# Patient Record
Sex: Male | Born: 1955 | Hispanic: Yes | Marital: Married | Smoking: Former smoker
Health system: Southern US, Community
[De-identification: ages and names within clinical notes are randomized; demographics above are authoritative.]

## PROBLEM LIST (undated history)

## (undated) DIAGNOSIS — I219 Acute myocardial infarction, unspecified: Secondary | ICD-10-CM

## (undated) DIAGNOSIS — I2510 Atherosclerotic heart disease of native coronary artery without angina pectoris: Secondary | ICD-10-CM

## (undated) DIAGNOSIS — H35039 Hypertensive retinopathy, unspecified eye: Secondary | ICD-10-CM

## (undated) DIAGNOSIS — G4730 Sleep apnea, unspecified: Secondary | ICD-10-CM

## (undated) DIAGNOSIS — E119 Type 2 diabetes mellitus without complications: Secondary | ICD-10-CM

## (undated) DIAGNOSIS — M1990 Unspecified osteoarthritis, unspecified site: Secondary | ICD-10-CM

## (undated) DIAGNOSIS — I739 Peripheral vascular disease, unspecified: Secondary | ICD-10-CM

## (undated) DIAGNOSIS — Z973 Presence of spectacles and contact lenses: Secondary | ICD-10-CM

## (undated) DIAGNOSIS — E11319 Type 2 diabetes mellitus with unspecified diabetic retinopathy without macular edema: Secondary | ICD-10-CM

## (undated) DIAGNOSIS — H269 Unspecified cataract: Secondary | ICD-10-CM

## (undated) DIAGNOSIS — F419 Anxiety disorder, unspecified: Secondary | ICD-10-CM

## (undated) DIAGNOSIS — M25811 Other specified joint disorders, right shoulder: Secondary | ICD-10-CM

## (undated) DIAGNOSIS — M7541 Impingement syndrome of right shoulder: Secondary | ICD-10-CM

## (undated) DIAGNOSIS — I509 Heart failure, unspecified: Secondary | ICD-10-CM

## (undated) DIAGNOSIS — K259 Gastric ulcer, unspecified as acute or chronic, without hemorrhage or perforation: Secondary | ICD-10-CM

## (undated) DIAGNOSIS — I499 Cardiac arrhythmia, unspecified: Secondary | ICD-10-CM

## (undated) DIAGNOSIS — J189 Pneumonia, unspecified organism: Secondary | ICD-10-CM

## (undated) DIAGNOSIS — K219 Gastro-esophageal reflux disease without esophagitis: Secondary | ICD-10-CM

## (undated) DIAGNOSIS — I10 Essential (primary) hypertension: Secondary | ICD-10-CM

## (undated) DIAGNOSIS — Z95810 Presence of automatic (implantable) cardiac defibrillator: Secondary | ICD-10-CM

## (undated) HISTORY — PX: CARDIAC DEFIBRILLATOR PLACEMENT: SHX171

## (undated) HISTORY — DX: Unspecified cataract: H26.9

## (undated) HISTORY — DX: Type 2 diabetes mellitus with unspecified diabetic retinopathy without macular edema: E11.319

## (undated) HISTORY — DX: Hypertensive retinopathy, unspecified eye: H35.039

## (undated) HISTORY — PX: CARDIAC CATHETERIZATION: SHX172

---

## 2016-04-17 DIAGNOSIS — I2510 Atherosclerotic heart disease of native coronary artery without angina pectoris: Secondary | ICD-10-CM | POA: Diagnosis not present

## 2016-04-21 DIAGNOSIS — M1712 Unilateral primary osteoarthritis, left knee: Secondary | ICD-10-CM | POA: Diagnosis not present

## 2016-04-21 DIAGNOSIS — M25562 Pain in left knee: Secondary | ICD-10-CM | POA: Diagnosis not present

## 2016-07-14 ENCOUNTER — Encounter (HOSPITAL_COMMUNITY): Payer: Self-pay | Admitting: Emergency Medicine

## 2016-07-14 ENCOUNTER — Emergency Department (HOSPITAL_COMMUNITY): Payer: Medicaid Other

## 2016-07-14 ENCOUNTER — Emergency Department (HOSPITAL_COMMUNITY)
Admission: EM | Admit: 2016-07-14 | Discharge: 2016-07-14 | Disposition: A | Discharge status: Home / Self Care | Payer: Medicaid Other | Attending: Emergency Medicine | Admitting: Emergency Medicine

## 2016-07-14 DIAGNOSIS — Y939 Activity, unspecified: Secondary | ICD-10-CM | POA: Insufficient documentation

## 2016-07-14 DIAGNOSIS — F17200 Nicotine dependence, unspecified, uncomplicated: Secondary | ICD-10-CM | POA: Diagnosis not present

## 2016-07-14 DIAGNOSIS — Z7984 Long term (current) use of oral hypoglycemic drugs: Secondary | ICD-10-CM | POA: Insufficient documentation

## 2016-07-14 DIAGNOSIS — M7022 Olecranon bursitis, left elbow: Secondary | ICD-10-CM | POA: Insufficient documentation

## 2016-07-14 DIAGNOSIS — Z79899 Other long term (current) drug therapy: Secondary | ICD-10-CM | POA: Diagnosis not present

## 2016-07-14 DIAGNOSIS — M25522 Pain in left elbow: Secondary | ICD-10-CM | POA: Diagnosis present

## 2016-07-14 DIAGNOSIS — L03114 Cellulitis of left upper limb: Secondary | ICD-10-CM | POA: Diagnosis not present

## 2016-07-14 DIAGNOSIS — E119 Type 2 diabetes mellitus without complications: Secondary | ICD-10-CM | POA: Diagnosis not present

## 2016-07-14 HISTORY — DX: Type 2 diabetes mellitus without complications: E11.9

## 2016-07-14 MED ORDER — NAPROXEN 375 MG PO TABS: 375.0000 mg | ORAL_TABLET | Freq: Two times a day (BID) | ORAL | 0 refills | Status: DC

## 2016-07-14 MED ORDER — CEPHALEXIN 500 MG PO CAPS: 500.0000 mg | ORAL_CAPSULE | Freq: Two times a day (BID) | ORAL | 0 refills | Status: DC

## 2016-07-14 NOTE — ED Provider Notes (Signed)
[facility]-EMERGENCY DEPT Provider Note   CSN: [id number] Arrival date & time: 07/14/16  1112     History   Chief Complaint Chief Complaint  Patient presents with  . Joint Swelling    HPI Jon Jones is a 61 y.o. male.  Patient is a 61 year old male with a history of diabetes presenting today with 3 days of worsening left elbow pain and redness. He denies any systemic symptoms of fever, nausea, vomiting or change in his blood sugars. He notes swelling, warmth and redness over the left elbow with redness spreading mildly down the forearm. No wrist or shoulder pain. He is able to bend and extend the elbow without difficulty and denies any numbness or tingling. States he had one episode prior years ago that resolved spontaneously.   The history is provided by the patient.    Past Medical History:  Diagnosis Date  . Diabetes mellitus without complication ([name])     There are no active problems to display for this patient.   History reviewed. No pertinent surgical history.     Home Medications    Prior to Admission medications   Medication Sig Start Date End Date Taking? Authorizing Provider  atorvastatin (LIPITOR) 80 MG tablet Take 80 mg by mouth every evening. 04/17/16 04/17/17 Yes Historical Provider, MD  buPROPion (WELLBUTRIN SR) 150 MG 12 hr tablet Take 150 mg by mouth 2 (two) times daily at 8 am and 10 pm. 04/11/16  Yes Historical Provider, MD  empagliflozin (JARDIANCE) 25 MG TABS tablet Take 25 mg by mouth daily. 07/03/16  Yes Historical Provider, MD  glipiZIDE (GLUCOTROL) 10 MG tablet Take 10 mg by mouth 2 (two) times daily at 8 am and 10 pm. 04/03/16  Yes Historical Provider, MD  lisinopril (PRINIVIL,ZESTRIL) 5 MG tablet Take 5 mg by mouth daily. 04/17/16 04/17/17 Yes Historical Provider, MD  metFORMIN (GLUCOPHAGE) 1000 MG tablet Take 1,000 mg by mouth 2 (two) times daily at 8 am and 10 pm. 04/13/16  Yes Historical Provider, MD  metoprolol succinate (TOPROL-XL) 100 MG 24 hr tablet Take  100 mg by mouth every evening. 04/17/16 04/17/17 Yes Historical Provider, MD  nitroGLYCERIN (NITROSTAT) 0.4 MG SL tablet Place 0.4 mg under the tongue See admin instructions. Every 5 minutes as needed for chest pain. Max of 3 doses 04/03/16  Yes Historical Provider, MD  tiotropium (SPIRIVA) 18 MCG inhalation capsule Place 1 capsule into inhaler and inhale daily. 04/17/16 04/17/17 Yes Historical Provider, MD    Family History History reviewed. No pertinent family history.  Social History Social History  Substance Use Topics  . Smoking status: Current Every Day Smoker  . Smokeless tobacco: Never Used  . Alcohol use No     Allergies   Patient has no known allergies.   Review of Systems Review of Systems  All other systems reviewed and are negative.    Physical Exam Updated Vital Signs BP 100/73 (BP Location: Right Arm)   Pulse 91   Temp 99.1 F (37.3 C) (Oral)   Resp 15   Ht 5\' 6"  (1.676 m)   Wt 220 lb (99.8 kg)   SpO2 96%   BMI 35.51 kg/m   Physical Exam  Constitutional: He is oriented to person, place, and time. He appears well-developed and well-nourished. No distress.  HENT:  Head: Normocephalic and atraumatic.  Mouth/Throat: Oropharynx is clear and moist.  Eyes: Conjunctivae and EOM are normal. Pupils are equal, round, and reactive to light.  Neck: Normal range of motion. Neck supple.  Cardiovascular: Normal rate, regular rhythm and intact distal pulses.   No murmur heard. Pulmonary/Chest: Effort normal and breath sounds normal. No respiratory distress. He has no wheezes. He has no rales.  Abdominal: Soft. He exhibits no distension. There is no tenderness. There is no rebound and no guarding.  Musculoskeletal: Normal range of motion. He exhibits edema and tenderness.       Left elbow: He exhibits swelling and effusion. He exhibits normal range of motion. Tenderness found. Olecranon process tenderness noted.       Arms: Neurological: He is alert and oriented to person,  place, and time.  Skin: Skin is warm and dry. No rash noted. No erythema.  Psychiatric: He has a normal mood and affect. His behavior is normal.  Nursing note and vitals reviewed.    ED Treatments / Results  Labs (all labs ordered are listed, but only abnormal results are displayed) Labs Reviewed - No data to display  EKG  EKG Interpretation None       Radiology Dg Elbow Complete Left  Result Date: 07/14/2016 CLINICAL DATA:  Left posterior elbow pain and swelling for 3 days EXAM: LEFT ELBOW - COMPLETE 3+ VIEW COMPARISON:  None. FINDINGS: There is no evidence of fracture, dislocation, or joint effusion. There is no evidence of arthropathy or other focal bone abnormality. Soft tissue swelling along the dorsal aspect of the olecranon which may reflect olecranon bursitis. IMPRESSION: No acute osseous injury of the left elbow. Electronically Signed   By: Elige KoHetal  Patel   On: 07/14/2016 12:45    Procedures Procedures (including critical care time)  Medications Ordered in ED Medications - No data to display   Initial Impression / Assessment and Plan / ED Course  I have reviewed the triage vital signs and the nursing notes.  Pertinent labs & imaging results that were available during my care of the patient were reviewed by me and considered in my medical decision making (see chart for details).     Patient presenting here with evidence of olecranon bursitis concern for also secondary infection however no systemic symptoms. Low suspicion for septic joint. Patient's symptoms started after he had just recently put a porch on his house and he was leaning over the side rails for long periods of time enjoying the outdoor weather. X-ray is within normal limits. Will start patient on Keflex and anti-inflammatories. To follow-up with orthopedics.  Final Clinical Impressions(s) / ED Diagnoses   Final diagnoses:  Olecranon bursitis of left elbow  Cellulitis of left upper extremity    New  Prescriptions New Prescriptions   CEPHALEXIN (KEFLEX) 500 MG CAPSULE    Take 1 capsule (500 mg total) by mouth 2 (two) times daily.   NAPROXEN (NAPROSYN) 375 MG TABLET    Take 1 tablet (375 mg total) by mouth 2 (two) times daily.     Gwyneth SproutWhitney Faolan [place], MD 07/14/16 [phone] [id number]

## 2016-07-14 NOTE — ED Triage Notes (Signed)
Pt reports increased L elbow redness and swelling. L elbow red and swollen.

## 2016-07-14 NOTE — ED Notes (Signed)
ED Provider at bedside. 

## 2016-07-14 NOTE — ED Notes (Signed)
Discharge instructions, follow up care, and rx x2 reviewed with patient. Patient verbalized understanding. 

## 2016-07-19 ENCOUNTER — Encounter (HOSPITAL_COMMUNITY)
Admission: RE | Disposition: A | Discharge status: Home / Self Care | Payer: Self-pay | Source: Ambulatory Visit | Attending: Orthopaedic Surgery

## 2016-07-19 ENCOUNTER — Other Ambulatory Visit (INDEPENDENT_AMBULATORY_CARE_PROVIDER_SITE_OTHER): Payer: Self-pay | Admitting: Orthopaedic Surgery

## 2016-07-19 ENCOUNTER — Ambulatory Visit (HOSPITAL_BASED_OUTPATIENT_CLINIC_OR_DEPARTMENT_OTHER): Payer: Medicaid Other | Admitting: Anesthesiology

## 2016-07-19 ENCOUNTER — Encounter (HOSPITAL_BASED_OUTPATIENT_CLINIC_OR_DEPARTMENT_OTHER): Payer: Self-pay | Admitting: *Deleted

## 2016-07-19 ENCOUNTER — Observation Stay (HOSPITAL_BASED_OUTPATIENT_CLINIC_OR_DEPARTMENT_OTHER)
Admission: RE | Admit: 2016-07-19 | Discharge: 2016-07-20 | Disposition: A | Discharge status: Home / Self Care | Payer: Medicaid Other | Source: Ambulatory Visit | Attending: Orthopaedic Surgery | Admitting: Orthopaedic Surgery

## 2016-07-19 ENCOUNTER — Ambulatory Visit (INDEPENDENT_AMBULATORY_CARE_PROVIDER_SITE_OTHER): Payer: Medicaid Other | Admitting: Orthopedic Surgery

## 2016-07-19 ENCOUNTER — Encounter (INDEPENDENT_AMBULATORY_CARE_PROVIDER_SITE_OTHER): Payer: Self-pay | Admitting: Orthopedic Surgery

## 2016-07-19 DIAGNOSIS — I10 Essential (primary) hypertension: Secondary | ICD-10-CM | POA: Insufficient documentation

## 2016-07-19 DIAGNOSIS — G4730 Sleep apnea, unspecified: Secondary | ICD-10-CM | POA: Insufficient documentation

## 2016-07-19 DIAGNOSIS — M7022 Olecranon bursitis, left elbow: Secondary | ICD-10-CM

## 2016-07-19 DIAGNOSIS — M71122 Other infective bursitis, left elbow: Secondary | ICD-10-CM

## 2016-07-19 DIAGNOSIS — E1151 Type 2 diabetes mellitus with diabetic peripheral angiopathy without gangrene: Secondary | ICD-10-CM | POA: Diagnosis not present

## 2016-07-19 DIAGNOSIS — F17210 Nicotine dependence, cigarettes, uncomplicated: Secondary | ICD-10-CM | POA: Diagnosis not present

## 2016-07-19 DIAGNOSIS — M109 Gout, unspecified: Secondary | ICD-10-CM | POA: Diagnosis present

## 2016-07-19 DIAGNOSIS — I110 Hypertensive heart disease with heart failure: Secondary | ICD-10-CM | POA: Diagnosis not present

## 2016-07-19 DIAGNOSIS — Z7984 Long term (current) use of oral hypoglycemic drugs: Secondary | ICD-10-CM | POA: Diagnosis not present

## 2016-07-19 DIAGNOSIS — M1A9XX1 Chronic gout, unspecified, with tophus (tophi): Secondary | ICD-10-CM | POA: Diagnosis not present

## 2016-07-19 DIAGNOSIS — L02414 Cutaneous abscess of left upper limb: Secondary | ICD-10-CM | POA: Diagnosis present

## 2016-07-19 DIAGNOSIS — I252 Old myocardial infarction: Secondary | ICD-10-CM | POA: Diagnosis not present

## 2016-07-19 DIAGNOSIS — Z9889 Other specified postprocedural states: Secondary | ICD-10-CM

## 2016-07-19 DIAGNOSIS — I509 Heart failure, unspecified: Secondary | ICD-10-CM | POA: Insufficient documentation

## 2016-07-19 DIAGNOSIS — I2510 Atherosclerotic heart disease of native coronary artery without angina pectoris: Secondary | ICD-10-CM | POA: Diagnosis not present

## 2016-07-19 HISTORY — PX: I & D EXTREMITY: SHX5045

## 2016-07-19 HISTORY — DX: Heart failure, unspecified: I50.9

## 2016-07-19 HISTORY — DX: Acute myocardial infarction, unspecified: I21.9

## 2016-07-19 HISTORY — PX: OLECRANON BURSECTOMY: SHX2097

## 2016-07-19 HISTORY — DX: Cardiac arrhythmia, unspecified: I49.9

## 2016-07-19 HISTORY — DX: Peripheral vascular disease, unspecified: I73.9

## 2016-07-19 HISTORY — DX: Atherosclerotic heart disease of native coronary artery without angina pectoris: I25.10

## 2016-07-19 HISTORY — DX: Sleep apnea, unspecified: G47.30

## 2016-07-19 LAB — POCT I-STAT, CHEM 8
BUN: 21 mg/dL — AB (ref 6–20)
CHLORIDE: 107 mmol/L (ref 101–111)
CREATININE: 0.9 mg/dL (ref 0.61–1.24)
Calcium, Ion: 1.27 mmol/L (ref 1.15–1.40)
Glucose, Bld: 111 mg/dL — ABNORMAL HIGH (ref 65–99)
HEMATOCRIT: 46 % (ref 39.0–52.0)
Hemoglobin: 15.6 g/dL (ref 13.0–17.0)
POTASSIUM: 4.2 mmol/L (ref 3.5–5.1)
Sodium: 141 mmol/L (ref 135–145)
TCO2: 23 mmol/L (ref 0–100)

## 2016-07-19 LAB — GLUCOSE, CAPILLARY
GLUCOSE-CAPILLARY: 82 mg/dL (ref 65–99)
GLUCOSE-CAPILLARY: 115 mg/dL — AB (ref 65–99)
GLUCOSE-CAPILLARY: 188 mg/dL — AB (ref 65–99)
Glucose-Capillary: 72 mg/dL (ref 65–99)

## 2016-07-19 SURGERY — IRRIGATION AND DEBRIDEMENT EXTREMITY
Anesthesia: General | Site: Elbow | Laterality: Left

## 2016-07-19 SURGERY — BURSECTOMY, ELBOW
Anesthesia: Monitor Anesthesia Care | Laterality: Left

## 2016-07-19 MED ORDER — METHOCARBAMOL 500 MG PO TABS
500.0000 mg | ORAL_TABLET | Freq: Four times a day (QID) | ORAL | Status: DC | PRN
  Filled 2016-07-19: qty 1

## 2016-07-19 MED ORDER — METOCLOPRAMIDE HCL 5 MG/ML IJ SOLN: 5.0000 mg | Freq: Three times a day (TID) | INTRAMUSCULAR | Status: DC | PRN

## 2016-07-19 MED ORDER — LIDOCAINE HCL (CARDIAC) 20 MG/ML IV SOLN
INTRAVENOUS | Status: DC | PRN
  Administered 2016-07-19: 50 mg via INTRATRACHEAL

## 2016-07-19 MED ORDER — METOPROLOL SUCCINATE ER 100 MG PO TB24
100.0000 mg | ORAL_TABLET | Freq: Every evening | ORAL | Status: DC
  Administered 2016-07-19: 100 mg via ORAL
  Filled 2016-07-19: qty 1

## 2016-07-19 MED ORDER — ATORVASTATIN CALCIUM 80 MG PO TABS: 80.0000 mg | ORAL_TABLET | Freq: Every evening | ORAL | Status: DC

## 2016-07-19 MED ORDER — SULFAMETHOXAZOLE-TRIMETHOPRIM 800-160 MG PO TABS: 1.0000 | ORAL_TABLET | Freq: Two times a day (BID) | ORAL | 0 refills | Status: DC

## 2016-07-19 MED ORDER — DIPHENHYDRAMINE HCL 12.5 MG/5ML PO ELIX: 25.0000 mg | ORAL_SOLUTION | ORAL | Status: DC | PRN

## 2016-07-19 MED ORDER — MIDAZOLAM HCL 2 MG/2ML IJ SOLN
INTRAMUSCULAR | Status: AC
  Administered 2016-07-19: 1 mg via INTRAVENOUS
  Filled 2016-07-19: qty 2

## 2016-07-19 MED ORDER — NAPROXEN 250 MG PO TABS
375.0000 mg | ORAL_TABLET | Freq: Two times a day (BID) | ORAL | Status: DC
  Administered 2016-07-19: 375 mg via ORAL
  Filled 2016-07-19: qty 2

## 2016-07-19 MED ORDER — MAGNESIUM CITRATE PO SOLN: 1.0000 | Freq: Once | ORAL | Status: DC | PRN

## 2016-07-19 MED ORDER — COLCHICINE 0.6 MG PO TABS: 0.6000 mg | ORAL_TABLET | Freq: Two times a day (BID) | ORAL | 0 refills | Status: DC | PRN

## 2016-07-19 MED ORDER — TIOTROPIUM BROMIDE MONOHYDRATE 18 MCG IN CAPS
1.0000 | ORAL_CAPSULE | Freq: Every day | RESPIRATORY_TRACT | Status: DC
  Filled 2016-07-19: qty 5

## 2016-07-19 MED ORDER — OXYCODONE HCL 5 MG PO TABS: 5.0000 mg | ORAL_TABLET | Freq: Once | ORAL | Status: DC | PRN

## 2016-07-19 MED ORDER — SORBITOL 70 % SOLN
30.0000 mL | Freq: Every day | Status: DC | PRN
  Filled 2016-07-19: qty 30

## 2016-07-19 MED ORDER — NITROGLYCERIN 0.4 MG SL SUBL: 0.4000 mg | SUBLINGUAL_TABLET | SUBLINGUAL | Status: DC | PRN

## 2016-07-19 MED ORDER — ONDANSETRON HCL 4 MG PO TABS: 4.0000 mg | ORAL_TABLET | Freq: Four times a day (QID) | ORAL | Status: DC | PRN

## 2016-07-19 MED ORDER — ONDANSETRON HCL 4 MG/2ML IJ SOLN: 4.0000 mg | Freq: Four times a day (QID) | INTRAMUSCULAR | Status: DC | PRN

## 2016-07-19 MED ORDER — METHOCARBAMOL 1000 MG/10ML IJ SOLN
500.0000 mg | Freq: Four times a day (QID) | INTRAVENOUS | Status: DC | PRN
  Filled 2016-07-19: qty 5

## 2016-07-19 MED ORDER — LACTATED RINGERS IV SOLN
Freq: Once | INTRAVENOUS | Status: AC
  Administered 2016-07-19: 15:00:00 via INTRAVENOUS

## 2016-07-19 MED ORDER — VANCOMYCIN HCL IN DEXTROSE 1-5 GM/200ML-% IV SOLN
INTRAVENOUS | Status: AC
  Filled 2016-07-19: qty 200

## 2016-07-19 MED ORDER — LIDOCAINE 2% (20 MG/ML) 5 ML SYRINGE
INTRAMUSCULAR | Status: AC
  Filled 2016-07-19: qty 5

## 2016-07-19 MED ORDER — ACETAMINOPHEN 325 MG PO TABS
650.0000 mg | ORAL_TABLET | Freq: Four times a day (QID) | ORAL | Status: DC | PRN
Start: 2016-07-19 — End: 2016-07-20

## 2016-07-19 MED ORDER — CEFAZOLIN SODIUM-DEXTROSE 1-4 GM/50ML-% IV SOLN
INTRAVENOUS | Status: DC | PRN
  Administered 2016-07-19: 1 g via INTRAVENOUS

## 2016-07-19 MED ORDER — CEFAZOLIN SODIUM-DEXTROSE 2-4 GM/100ML-% IV SOLN
2.0000 g | Freq: Four times a day (QID) | INTRAVENOUS | Status: DC
  Administered 2016-07-20 (×2): 2 g via INTRAVENOUS
  Filled 2016-07-19 (×3): qty 100

## 2016-07-19 MED ORDER — LACTATED RINGERS IV SOLN
INTRAVENOUS | Status: DC
Start: 2016-07-19 — End: 2016-07-19
  Administered 2016-07-19: 14:00:00 via INTRAVENOUS

## 2016-07-19 MED ORDER — OXYCODONE HCL 5 MG PO TABS
5.0000 mg | ORAL_TABLET | ORAL | Status: DC | PRN
  Administered 2016-07-19: 15 mg via ORAL
  Filled 2016-07-19: qty 3

## 2016-07-19 MED ORDER — HYDROCODONE-ACETAMINOPHEN 5-325 MG PO TABS: 1.0000 | ORAL_TABLET | Freq: Four times a day (QID) | ORAL | 0 refills | Status: DC | PRN

## 2016-07-19 MED ORDER — SODIUM CHLORIDE 0.9 % IR SOLN
Status: DC | PRN
  Administered 2016-07-19: 3000 mL

## 2016-07-19 MED ORDER — SCOPOLAMINE 1 MG/3DAYS TD PT72: 1.0000 | MEDICATED_PATCH | Freq: Once | TRANSDERMAL | Status: DC | PRN

## 2016-07-19 MED ORDER — FENTANYL CITRATE (PF) 100 MCG/2ML IJ SOLN
50.0000 ug | INTRAMUSCULAR | Status: DC | PRN
  Administered 2016-07-19: 50 ug via INTRAVENOUS

## 2016-07-19 MED ORDER — CEFAZOLIN SODIUM-DEXTROSE 2-4 GM/100ML-% IV SOLN: 2.0000 g | INTRAVENOUS | Status: DC

## 2016-07-19 MED ORDER — ACETAMINOPHEN 650 MG RE SUPP: 650.0000 mg | Freq: Four times a day (QID) | RECTAL | Status: DC | PRN

## 2016-07-19 MED ORDER — INSULIN ASPART 100 UNIT/ML ~~LOC~~ SOLN: 0.0000 [IU] | Freq: Every day | SUBCUTANEOUS | Status: DC

## 2016-07-19 MED ORDER — SODIUM CHLORIDE 0.9 % IV SOLN: INTRAVENOUS | Status: DC

## 2016-07-19 MED ORDER — FENTANYL CITRATE (PF) 100 MCG/2ML IJ SOLN: 25.0000 ug | INTRAMUSCULAR | Status: DC | PRN

## 2016-07-19 MED ORDER — MORPHINE SULFATE (PF) 4 MG/ML IV SOLN: 1.0000 mg | INTRAVENOUS | Status: DC | PRN

## 2016-07-19 MED ORDER — CANAGLIFLOZIN 100 MG PO TABS
100.0000 mg | ORAL_TABLET | Freq: Every day | ORAL | Status: DC
  Administered 2016-07-20: 100 mg via ORAL
  Filled 2016-07-19: qty 1

## 2016-07-19 MED ORDER — POLYETHYLENE GLYCOL 3350 17 G PO PACK: 17.0000 g | PACK | Freq: Every day | ORAL | Status: DC | PRN

## 2016-07-19 MED ORDER — MIDAZOLAM HCL 2 MG/2ML IJ SOLN
1.0000 mg | INTRAMUSCULAR | Status: DC | PRN
  Administered 2016-07-19: 1 mg via INTRAVENOUS

## 2016-07-19 MED ORDER — METOCLOPRAMIDE HCL 5 MG PO TABS: 5.0000 mg | ORAL_TABLET | Freq: Three times a day (TID) | ORAL | Status: DC | PRN

## 2016-07-19 MED ORDER — INSULIN ASPART 100 UNIT/ML ~~LOC~~ SOLN
0.0000 [IU] | Freq: Three times a day (TID) | SUBCUTANEOUS | Status: DC
  Administered 2016-07-20: 3 [IU] via SUBCUTANEOUS

## 2016-07-19 MED ORDER — GLIPIZIDE 5 MG PO TABS
10.0000 mg | ORAL_TABLET | Freq: Two times a day (BID) | ORAL | Status: DC
  Administered 2016-07-20: 10 mg via ORAL
  Filled 2016-07-19: qty 2

## 2016-07-19 MED ORDER — BUPROPION HCL ER (SR) 150 MG PO TB12
150.0000 mg | ORAL_TABLET | Freq: Two times a day (BID) | ORAL | Status: DC
  Administered 2016-07-19 – 2016-07-20 (×2): 150 mg via ORAL
  Filled 2016-07-19 (×2): qty 1

## 2016-07-19 MED ORDER — CEFAZOLIN SODIUM-DEXTROSE 2-4 GM/100ML-% IV SOLN
INTRAVENOUS | Status: AC
  Filled 2016-07-19: qty 100

## 2016-07-19 MED ORDER — LISINOPRIL 5 MG PO TABS: 5.0000 mg | ORAL_TABLET | Freq: Every day | ORAL | Status: DC

## 2016-07-19 MED ORDER — FENTANYL CITRATE (PF) 100 MCG/2ML IJ SOLN
INTRAMUSCULAR | Status: AC
  Administered 2016-07-19: 50 ug via INTRAVENOUS
  Filled 2016-07-19: qty 2

## 2016-07-19 MED ORDER — OXYCODONE HCL 5 MG/5ML PO SOLN: 5.0000 mg | Freq: Once | ORAL | Status: DC | PRN

## 2016-07-19 MED ORDER — LACTATED RINGERS IV SOLN
INTRAVENOUS | Status: DC | PRN
  Administered 2016-07-19: 19:00:00 via INTRAVENOUS

## 2016-07-19 MED ORDER — PROPOFOL 10 MG/ML IV BOLUS
INTRAVENOUS | Status: DC | PRN
  Administered 2016-07-19: 50 mg via INTRAVENOUS

## 2016-07-19 SURGICAL SUPPLY — 47 items
BANDAGE ACE 4X5 VEL STRL LF (GAUZE/BANDAGES/DRESSINGS) ×3 IMPLANT
BLADE SURG 15 STRL LF DISP TIS (BLADE) ×1 IMPLANT
BLADE SURG 15 STRL SS (BLADE) ×2
BNDG COHESIVE 3X5 TAN STRL LF (GAUZE/BANDAGES/DRESSINGS) ×3 IMPLANT
BNDG ESMARK 4X9 LF (GAUZE/BANDAGES/DRESSINGS) ×3 IMPLANT
CLOSURE WOUND 1/4X4 (GAUZE/BANDAGES/DRESSINGS)
COVER BACK TABLE 60X90IN (DRAPES) ×3 IMPLANT
COVER MAYO STAND STRL (DRAPES) ×3 IMPLANT
CUFF TOURN SGL LL 18 NRW (TOURNIQUET CUFF) IMPLANT
DECANTER SPIKE VIAL GLASS SM (MISCELLANEOUS) IMPLANT
DRAPE EXTREMITY T 121X128X90 (DRAPE) ×3 IMPLANT
DRAPE IMP U-DRAPE 54X76 (DRAPES) ×3 IMPLANT
DRAPE SURG 17X23 STRL (DRAPES) ×3 IMPLANT
DURAPREP 26ML APPLICATOR (WOUND CARE) ×3 IMPLANT
ELECT REM PT RETURN 9FT ADLT (ELECTROSURGICAL) ×3
ELECTRODE REM PT RTRN 9FT ADLT (ELECTROSURGICAL) ×1 IMPLANT
GAUZE SPONGE 4X4 12PLY STRL (GAUZE/BANDAGES/DRESSINGS) ×3 IMPLANT
GAUZE XEROFORM 1X8 LF (GAUZE/BANDAGES/DRESSINGS) ×3 IMPLANT
GLOVE SKINSENSE NS SZ7.5 (GLOVE) ×2
GLOVE SKINSENSE STRL SZ7.5 (GLOVE) ×1 IMPLANT
GLOVE SURG SYN 7.5  E (GLOVE) ×2
GLOVE SURG SYN 7.5 E (GLOVE) ×1 IMPLANT
GOWN STRL REIN XL XLG (GOWN DISPOSABLE) ×3 IMPLANT
GOWN STRL REUS W/ TWL LRG LVL3 (GOWN DISPOSABLE) ×1 IMPLANT
GOWN STRL REUS W/TWL LRG LVL3 (GOWN DISPOSABLE) ×2
NEEDLE HYPO 22GX1.5 SAFETY (NEEDLE) IMPLANT
NS IRRIG 1000ML POUR BTL (IV SOLUTION) ×3 IMPLANT
PACK BASIN DAY SURGERY FS (CUSTOM PROCEDURE TRAY) ×3 IMPLANT
PAD CAST 4YDX4 CTTN HI CHSV (CAST SUPPLIES) ×1 IMPLANT
PADDING CAST ABS 4INX4YD NS (CAST SUPPLIES)
PADDING CAST ABS COTTON 4X4 ST (CAST SUPPLIES) IMPLANT
PADDING CAST COTTON 4X4 STRL (CAST SUPPLIES) ×2
PENCIL BUTTON HOLSTER BLD 10FT (ELECTRODE) ×3 IMPLANT
SLEEVE SCD COMPRESS KNEE MED (MISCELLANEOUS) ×3 IMPLANT
SPLINT PLASTER CAST XFAST 3X15 (CAST SUPPLIES) IMPLANT
SPLINT PLASTER XTRA FASTSET 3X (CAST SUPPLIES)
STOCKINETTE IMPERVIOUS LG (DRAPES) ×3 IMPLANT
STRIP CLOSURE SKIN 1/4X4 (GAUZE/BANDAGES/DRESSINGS) IMPLANT
SUT ETHILON 3 0 PS 1 (SUTURE) IMPLANT
SUT ETHILON 4 0 PS 2 18 (SUTURE) IMPLANT
SUT VIC AB 2-0 CT1 27 (SUTURE)
SUT VIC AB 2-0 CT1 TAPERPNT 27 (SUTURE) IMPLANT
SUT VIC AB 4-0 P-3 18XBRD (SUTURE) IMPLANT
SUT VIC AB 4-0 P3 18 (SUTURE)
SYR BULB 3OZ (MISCELLANEOUS) ×3 IMPLANT
TOWEL OR 17X24 6PK STRL BLUE (TOWEL DISPOSABLE) ×3 IMPLANT
UNDERPAD 30X30 (UNDERPADS AND DIAPERS) ×3 IMPLANT

## 2016-07-19 SURGICAL SUPPLY — 51 items
BANDAGE ACE 3X5.8 VEL STRL LF (GAUZE/BANDAGES/DRESSINGS) IMPLANT
BANDAGE ACE 6X5 VEL STRL LF (GAUZE/BANDAGES/DRESSINGS) ×3 IMPLANT
BNDG COHESIVE 1X5 TAN STRL LF (GAUZE/BANDAGES/DRESSINGS) IMPLANT
BNDG COHESIVE 4X5 TAN STRL (GAUZE/BANDAGES/DRESSINGS) ×3 IMPLANT
BNDG COHESIVE 6X5 TAN STRL LF (GAUZE/BANDAGES/DRESSINGS) ×6 IMPLANT
BNDG CONFORM 3 STRL LF (GAUZE/BANDAGES/DRESSINGS) IMPLANT
BNDG GAUZE ELAST 4 BULKY (GAUZE/BANDAGES/DRESSINGS) ×3 IMPLANT
BNDG GAUZE STRTCH 6 (GAUZE/BANDAGES/DRESSINGS) ×3 IMPLANT
CORDS BIPOLAR (ELECTRODE) IMPLANT
COVER SURGICAL LIGHT HANDLE (MISCELLANEOUS) ×3 IMPLANT
CUFF TOURNIQUET SINGLE 24IN (TOURNIQUET CUFF) IMPLANT
CUFF TOURNIQUET SINGLE 34IN LL (TOURNIQUET CUFF) IMPLANT
CUFF TOURNIQUET SINGLE 44IN (TOURNIQUET CUFF) IMPLANT
DRAPE INCISE IOBAN 66X45 STRL (DRAPES) IMPLANT
DRAPE U-SHAPE 47X51 STRL (DRAPES) ×3 IMPLANT
DURAPREP 26ML APPLICATOR (WOUND CARE) ×3 IMPLANT
ELECT REM PT RETURN 9FT ADLT (ELECTROSURGICAL)
ELECTRODE REM PT RTRN 9FT ADLT (ELECTROSURGICAL) IMPLANT
GAUZE SPONGE 4X4 12PLY STRL (GAUZE/BANDAGES/DRESSINGS) ×6 IMPLANT
GAUZE SPONGE 4X4 12PLY STRL LF (GAUZE/BANDAGES/DRESSINGS) ×3 IMPLANT
GAUZE XEROFORM 1X8 LF (GAUZE/BANDAGES/DRESSINGS) ×3 IMPLANT
GAUZE XEROFORM 5X9 LF (GAUZE/BANDAGES/DRESSINGS) ×3 IMPLANT
GLOVE SKINSENSE NS SZ7.5 (GLOVE) ×4
GLOVE SKINSENSE STRL SZ7.5 (GLOVE) ×2 IMPLANT
GOWN STRL REIN XL XLG (GOWN DISPOSABLE) ×6 IMPLANT
HANDPIECE INTERPULSE COAX TIP (DISPOSABLE)
KIT BASIN OR (CUSTOM PROCEDURE TRAY) ×3 IMPLANT
KIT ROOM TURNOVER OR (KITS) ×3 IMPLANT
MANIFOLD NEPTUNE II (INSTRUMENTS) ×3 IMPLANT
PACK ORTHO EXTREMITY (CUSTOM PROCEDURE TRAY) ×3 IMPLANT
PAD ABD 8X10 STRL (GAUZE/BANDAGES/DRESSINGS) ×3 IMPLANT
PAD ARMBOARD 7.5X6 YLW CONV (MISCELLANEOUS) ×6 IMPLANT
PADDING CAST ABS 4INX4YD NS (CAST SUPPLIES) ×2
PADDING CAST ABS COTTON 4X4 ST (CAST SUPPLIES) ×1 IMPLANT
PADDING CAST COTTON 6X4 STRL (CAST SUPPLIES) ×3 IMPLANT
SET HNDPC FAN SPRY TIP SCT (DISPOSABLE) IMPLANT
SPONGE LAP 18X18 X RAY DECT (DISPOSABLE) ×3 IMPLANT
STOCKINETTE IMPERVIOUS 9X36 MD (GAUZE/BANDAGES/DRESSINGS) ×3 IMPLANT
SUT ETHILON 2 0 FS 18 (SUTURE) ×9 IMPLANT
SUT ETHILON 2 0 PSLX (SUTURE) ×3 IMPLANT
SUT VIC AB 2-0 FS1 27 (SUTURE) ×6 IMPLANT
SWAB CULTURE ESWAB REG 1ML (MISCELLANEOUS) IMPLANT
TOWEL OR 17X24 6PK STRL BLUE (TOWEL DISPOSABLE) ×3 IMPLANT
TOWEL OR 17X26 10 PK STRL BLUE (TOWEL DISPOSABLE) ×3 IMPLANT
TUBE CONNECTING 12'X1/4 (SUCTIONS) ×1
TUBE CONNECTING 12X1/4 (SUCTIONS) ×2 IMPLANT
TUBE FEEDING ENTERAL 5FR 16IN (TUBING) IMPLANT
TUBING CYSTO DISP (UROLOGICAL SUPPLIES) ×3 IMPLANT
UNDERPAD 30X30 (UNDERPADS AND DIAPERS) ×6 IMPLANT
WATER STERILE IRR 1000ML POUR (IV SOLUTION) ×3 IMPLANT
YANKAUER SUCT BULB TIP NO VENT (SUCTIONS) ×3 IMPLANT

## 2016-07-19 NOTE — Progress Notes (Signed)
Dr Sampson GoonFitzgerald said pt not candidate for day surgery. Dr Roda ShuttersXu called main hospital and put on their schedule. Informed pt/wife who understand. Capped off PIV to NSL. Put another gown around pt. Sent personal belongings him/wife. Wife will drive him to hospital for surgery. Reminded pt/wife to maintain NPO status. Called report to short stay Lillia Abed(Lindsay, RN).

## 2016-07-19 NOTE — H&P (Signed)
PREOPERATIVE H&P  Chief Complaint: LEFT ELBOW FRACTURE  HPI: Jon Jones is a 61 y.o. male who presents for surgical treatment of LEFT ELBOW FRACTURE.  He denies any changes in medical history.  Past Medical History:  Diagnosis Date  . 5 years ago   . CHF (congestive heart failure) ([name])   . Coronary artery disease   . Diabetes mellitus without complication ([name])   . Dysrhythmia   . Peripheral vascular disease ([name])   . Sleep apnea    History reviewed. No pertinent surgical history. Social History   Social History  . Marital status: Married    Spouse name: N/A  . Number of children: N/A  . Years of education: N/A   Social History Main Topics  . Smoking status: Current Every Day Smoker    Packs/day: 1.00    Years: 40.00  . Smokeless tobacco: Never Used  . Alcohol use 1.2 oz/week    2 Cans of beer per week     Comment: 2 per week  . Drug use: Unknown  . Sexual activity: Not Asked   Other Topics Concern  . None   Social History Narrative  . None   History reviewed. No pertinent family history. No Known Allergies Prior to Admission medications   Medication Sig Start Date End Date Taking? Authorizing Provider  atorvastatin (LIPITOR) 80 MG tablet Take 80 mg by mouth every evening. 04/17/16 04/17/17 Yes [provider]  buPROPion (WELLBUTRIN SR) 150 MG 12 hr tablet Take 150 mg by mouth 2 (two) times daily at 8 am and 10 pm. 04/11/16  Yes [provider]  cephALEXin (KEFLEX) 500 MG capsule Take 1 capsule (500 mg total) by mouth 2 (two) times daily. 07/14/16  Yes Plunkett, Alphonzo LemmingsWhitney, MD  empagliflozin (JARDIANCE) 25 MG TABS tablet Take 25 mg by mouth daily. 07/03/16  Yes [provider]  glipiZIDE (GLUCOTROL) 10 MG tablet Take 10 mg by mouth 2 (two) times daily at 8 am and 10 pm. 04/03/16  Yes [provider]  lisinopril (PRINIVIL,ZESTRIL) 5 MG tablet Take 5 mg by mouth daily. 04/17/16 04/17/17 Yes [provider]  metFORMIN (GLUCOPHAGE)  1000 MG tablet Take 1,000 mg by mouth 2 (two) times daily at 8 am and 10 pm. 04/13/16  Yes [provider]  metoprolol succinate (TOPROL-XL) 100 MG 24 hr tablet Take 100 mg by mouth every evening. 04/17/16 04/17/17 Yes [provider]  naproxen (NAPROSYN) 375 MG tablet Take 1 tablet (375 mg total) by mouth 2 (two) times daily. 07/14/16  Yes Gwyneth SproutPlunkett, Whitney, MD  tiotropium (SPIRIVA) 18 MCG inhalation capsule Place 1 capsule into inhaler and inhale at bedtime.  04/17/16 04/17/17 Yes [provider]  nitroGLYCERIN (NITROSTAT) 0.4 MG SL tablet Place 0.4 mg under the tongue See admin instructions. Every 5 minutes as needed for chest pain. Max of 3 doses 04/03/16   [provider]     Positive ROS: All other systems have been reviewed and were otherwise negative with the exception of those mentioned in the HPI and as above.  Physical Exam: General: Alert, no acute distress Cardiovascular: No pedal edema Respiratory: No cyanosis, no use of accessory musculature GI: abdomen soft Skin: No lesions in the area of chief complaint Neurologic: Sensation intact distally Psychiatric: Patient is competent for consent with normal mood and affect Lymphatic: no lymphedema  MUSCULOSKELETAL: exam stable  Assessment: LEFT ELBOW FRACTURE  Plan: Plan for Procedure(s): IRRIGATION AND DEBRIDEMENT EXTREMITY/OLECRANON(WASHOUT)  The risks benefits and alternatives  were discussed with the patient including but not limited to the risks of nonoperative treatment, versus surgical intervention including infection, bleeding, nerve injury,  blood clots, cardiopulmonary complications, morbidity, mortality, among others, and they were willing to proceed.   Glee Arvin, MD   07/19/2016 6:09 PM

## 2016-07-19 NOTE — Op Note (Signed)
   Date of Surgery: 07/19/2016  INDICATIONS: Mr. Jon Jones is a [age] y.o.-year-old male with a left elbow cellulitis and swelling concerning for abscess;  The patient did consent to the procedure after discussion of the risks and benefits.  PREOPERATIVE DIAGNOSIS: Left elbow abscess  POSTOPERATIVE DIAGNOSIS: Left elbow tophaceous gout.  PROCEDURE: Incision and drainage of left elbow, deep  SURGEON: N. Glee ArvinMichael Xu, M.D.  ASSIST: Hart CarwinJustin Queen, RNFA.  ANESTHESIA:  general  IV FLUIDS AND URINE: See anesthesia.  ESTIMATED BLOOD LOSS: minimal mL.  IMPLANTS: regional  DRAINS: none  COMPLICATIONS: None.  DESCRIPTION OF PROCEDURE: The patient was brought to the operating room and placed supine on the operating table.  The patient had been signed prior to the procedure and this was documented. The patient had the anesthesia placed by the anesthesiologist.  A time-out was performed to confirm that this was the correct patient, site, side and location. The patient did receive antibiotics prior to the incision and was re-dosed during the procedure as needed at indicated intervals.  A tourniquet was placed.  The patient had the operative extremity prepped and draped in the standard surgical fashion.    The tourniquet was inflated to 250 mmHg. Longitudinal incision was made directly over the fluctuant area. Blunt dissection was then used to enter the deep pocket. There was no evidence of frank pus and instead there was a chalky white material that was consistent with tophi.  I did take cultures of the specimen to be sure. I then squeezed the fluctuant area circumferentially to make sure there was no additional pockets. I then thoroughly irrigated the wound with normal saline. The incision was closed with 3-0 nylon. Sterile dressings were applied. Patient tolerated procedure well and had no immediate competitions.  POSTOPERATIVE PLAN: Patient will be discharged home tonight on prophylactic antibiotics. We will  follow up on the cultures.  Mayra ReelN. Michael Xu, MD [facility] [phone] 7:13 PM

## 2016-07-19 NOTE — Anesthesia Preprocedure Evaluation (Addendum)
Anesthesia Evaluation  Patient identified by MRN, date of birth, ID band Patient awake    Reviewed: Allergy & Precautions, NPO status , Patient's Chart, lab work & pertinent test results  Airway Mallampati: III  TM Distance: >3 FB Neck ROM: Full    Dental  (+) Lower Dentures, Upper Dentures   Pulmonary sleep apnea , Current Smoker,    breath sounds clear to auscultation       Cardiovascular hypertension, Pt. on medications and Pt. on home beta blockers + CAD, + Past MI, + Peripheral Vascular Disease and +CHF   Rhythm:Regular     Neuro/Psych negative neurological ROS     GI/Hepatic negative GI ROS, Neg liver ROS,   Endo/Other  diabetes, Type 2, Oral Hypoglycemic Agents  Renal/GU negative Renal ROS     Musculoskeletal   Abdominal   Peds  Hematology negative hematology ROS (+)   Anesthesia Other Findings EF 35% needs ICD per [facility]  Reproductive/Obstetrics                            Anesthesia Physical Anesthesia Plan  ASA: III  Anesthesia Plan: MAC and Regional   Post-op Pain Management:    Induction: Intravenous  Airway Management Planned: Natural Airway, Nasal Cannula and Simple Face Mask  Additional Equipment:   Intra-op Plan:   Post-operative Plan:   Informed Consent: I have reviewed the patients History and Physical, chart, labs and discussed the procedure including the risks, benefits and alternatives for the proposed anesthesia with the patient or authorized representative who has indicated his/her understanding and acceptance.   Dental advisory given  Plan Discussed with: CRNA and Surgeon  Anesthesia Plan Comments:        Anesthesia Quick Evaluation

## 2016-07-19 NOTE — Discharge Instructions (Signed)
Postoperative instructions: ° °Weightbearing instructions: as tolerated ° °Keep your dressing and/or splint clean and dry at all times.  You can remove your dressing on post-operative day #3 and change with a dry/sterile dressing or Band-Aids as needed thereafter.   ° °Incision instructions:  Do not soak your incision for 3 weeks after surgery.  If the incision gets wet, pat dry and do not scrub the incision. ° °Pain control:  You have been given a prescription to be taken as directed for post-operative pain control.  In addition, elevate the operative extremity above the heart at all times to prevent swelling and throbbing pain. ° °Take over-the-counter Colace, 100mg by mouth twice a day while taking narcotic pain medications to help prevent constipation. ° °Follow up appointments: °1) 10-14 days for suture removal and wound check. °2) Dr. Zahlia Deshazer as scheduled. ° ° ------------------------------------------------------------------------------------------------------------- ° °After Surgery Pain Control: ° °After your surgery, post-surgical discomfort or pain is likely. This discomfort can last several days to a few weeks. At certain times of the day your discomfort may be more intense.  °Did you receive a nerve block?  °A nerve block can provide pain relief for one hour to two days after your surgery. As long as the nerve block is working, you will experience little or no sensation in the area the surgeon operated on.  °As the nerve block wears off, you will begin to experience pain or discomfort. It is very important that you begin taking your prescribed pain medication before the nerve block fully wears off. Treating your pain at the first sign of the block wearing off will ensure your pain is better controlled and more tolerable when full-sensation returns. Do not wait until the pain is intolerable, as the medicine will be less effective. It is better to treat pain in advance than to try and catch up.  °General  Anesthesia:  °If you did not receive a nerve block during your surgery, you will need to start taking your pain medication shortly after your surgery and should continue to do so as prescribed by your surgeon.  °Pain Medication:  °Most commonly we prescribe Vicodin and Percocet for post-operative pain. Both of these medications contain a combination of acetaminophen (Tylenol®) and a narcotic to help control pain.  °· It takes between 30 and 45 minutes before pain medication starts to work. It is important to take your medication before your pain level gets too intense.  °· Nausea is a common side effect of many pain medications. You will want to eat something before taking your pain medicine to help prevent nausea.  °· If you are taking a prescription pain medication that contains acetaminophen, we recommend that you do not take additional over the counter acetaminophen (Tylenol®).  °Other pain relieving options:  °· Using a cold pack to ice the affected area a few times a day (15 to 20 minutes at a time) can help to relieve pain, reduce swelling and bruising.  °· Elevation of the affected area can also help to reduce pain and swelling. ° ° ° °

## 2016-07-19 NOTE — Transfer of Care (Signed)
Immediate Anesthesia Transfer of Care Note  [name]  Procedure(s) Performed: Procedure(s): LEFT ELBOW OLECRANON BURSECTOMY (Left)  Patient Location: PACU  Anesthesia Type:MAC and Regional  Level of Consciousness: awake, alert , oriented and patient cooperative  Airway & Oxygen Therapy: Patient Spontanous Breathing  Post-op Assessment: Report given to RN, Post -op Vital signs reviewed and stable and Patient moving all extremities X 4  Post vital signs: Reviewed and stable  Last Vitals:  Vitals:   07/19/16 1336  BP: 110/62  Pulse: 73  Resp: 18  Temp: 36.4 C    Last Pain:  Vitals:   07/19/16 1336  TempSrc: Oral      Patients Stated Pain Goal: 3 (03/05/81 5003)  Complications: No apparent anesthesia complications

## 2016-07-19 NOTE — Anesthesia Postprocedure Evaluation (Signed)
Anesthesia Post Note  [name]  Procedure(s) Performed: Procedure(s) (LRB): LEFT ELBOW OLECRANON BURSECTOMY (Left)  Patient location during evaluation: PACU Anesthesia Type: MAC Level of consciousness: awake and alert Pain management: pain level controlled Vital Signs Assessment: post-procedure vital signs reviewed and stable Respiratory status: spontaneous breathing, nonlabored ventilation, respiratory function stable and patient connected to nasal cannula oxygen Cardiovascular status: stable and blood pressure returned to baseline Anesthetic complications: no       Last Vitals:  Vitals:   07/19/16 1950 07/19/16 2005  BP: 119/78 127/76  Pulse: 76 73  Resp: (!) 21 20  Temp:      Last Pain:  Vitals:   07/19/16 1336  TempSrc: Oral                 Catalina Gravel

## 2016-07-19 NOTE — Progress Notes (Signed)
Patient's blood sugar 72.  Dr. Maple HudsonMoser made aware.  Patient is asymptomatic.

## 2016-07-19 NOTE — Progress Notes (Signed)
   Office Visit Note   [name]           Date of Birth: 04/25/1955           MRN: [id number] Visit Date: 07/19/2016 Requested by: Delight OvensSawka, Sara A, PA-C [address] [place], [place] [id number] PCP: Elmarie MainlandSawka, Sara A, PA-C  Subjective: Chief Complaint  Patient presents with  . Left Elbow - Pain    HPI: Kandis MannanOmar is a 61 year old right-hand-dominant patient with left elbow pain and bursitis that he sat for 10 years.  However he reports significant increase in pain over the past week with swelling and redness.  He has been on oral Keflex for the past 5 days but his cellulitis has worsened.  He reports tenderness to touch which is a new finding for him.  Outside radiographs reviewed are unremarkable for any bony abnormalities.  He denies any fevers or chills.              ROS: All systems reviewed are negative as they relate to the chief complaint within the history of present illness.  Patient denies  fevers or chills.   Assessment & Plan: Visit Diagnoses:  1. Olecranon bursitis of left elbow     Plan: Impression is left elbow chronic bursitis with overlying cellulitis and likely infection in a diabetic patient.  Plan is for surgical debridement with 23 hour observation for postop IV antibiotics I clearly vancomycin.  His fluid collection is more proximal in its likely this is infected.  Distally over the elbow joint itself the bursal is more boggy but I think requires removal.  I did talk with Dr. Deno Etiennehu about this patient and he has agreed to evaluate and likely perform bursectomy this afternoon.  I did explain some of the risks and benefits of the procedure to the patient.  Follow-Up Instructions: No Follow-up on file.   Orders:  No orders of the defined types were placed in this encounter.  No orders of the defined types were placed in this encounter.     Procedures: No procedures performed   Clinical Data: No additional findings.  Objective: Vital Signs: There were no  vitals taken for this visit.  Physical Exam:   Constitutional: Patient appears well-developed HEENT:  Head: Normocephalic Eyes:EOM are normal Neck: Normal range of motion Cardiovascular: Normal rate Pulmonary/chest: Effort normal Neurologic: Patient is alert Skin: Skin is warm Psychiatric: Patient has normal mood and affect    Ortho Exam: Orthopedic exam of the left arm demonstrates full range of motion of the left elbow flexion-extension pronation supination.  There is a fluid pocket proximal hole to the olecranon tip.  Cellulitis is present down to the ulnar styloid and extending proximally hand breath above the elbow olecranon tip.  There is no tissue crepitus.  Motor sensory function to the hand is intact.  Specialty Comments:  No specialty comments available.  Imaging: No results found.   PMFS History: There are no active problems to display for this patient.  Past Medical History:  Diagnosis Date  . Diabetes mellitus without complication ([name])     No family history on file.  No past surgical history on file. Social History   Occupational History  . Not on file.   Social History Main Topics  . Smoking status: Current Every Day Smoker  . Smokeless tobacco: Never Used  . Alcohol use No  . Drug use: Unknown  . Sexual activity: Not on file

## 2016-07-19 NOTE — Anesthesia Procedure Notes (Addendum)
Anesthesia Regional Block: Supraclavicular block   Pre-Anesthetic Checklist: ,, timeout performed, Correct Patient, Correct Site, Correct Laterality, Correct Procedure, Correct Position, site marked, Risks and benefits discussed,  Surgical consent,  Pre-op evaluation,  At surgeon's request and post-op pain management  Laterality: Upper and Left  Prep: chloraprep       Needles:  Injection technique: Single-shot  Needle Type: Echogenic Needle          Additional Needles:   Procedures: ultrasound guided,,,,,,,,  Narrative:  Start time: 07/19/2016 5:31 PM End time: 07/19/2016 5:39 PM Injection made incrementally with aspirations every 5 mL.  Performed by: Personally   Additional Notes: H+P and labs reviewed, risks and benefits discussed with patient, procedure tolerated well without complications

## 2016-07-20 ENCOUNTER — Telehealth (INDEPENDENT_AMBULATORY_CARE_PROVIDER_SITE_OTHER): Payer: Self-pay | Admitting: Orthopaedic Surgery

## 2016-07-20 ENCOUNTER — Encounter (HOSPITAL_COMMUNITY): Payer: Self-pay | Admitting: Orthopaedic Surgery

## 2016-07-20 ENCOUNTER — Telehealth (INDEPENDENT_AMBULATORY_CARE_PROVIDER_SITE_OTHER): Payer: Self-pay | Admitting: Specialist

## 2016-07-20 DIAGNOSIS — M1A9XX1 Chronic gout, unspecified, with tophus (tophi): Secondary | ICD-10-CM | POA: Diagnosis not present

## 2016-07-20 LAB — GLUCOSE, CAPILLARY: GLUCOSE-CAPILLARY: 134 mg/dL — AB (ref 65–99)

## 2016-07-20 MED ORDER — DIPHENHYDRAMINE HCL 25 MG PO CAPS: 25.0000 mg | ORAL_CAPSULE | Freq: Four times a day (QID) | ORAL | 0 refills | Status: DC | PRN

## 2016-07-20 NOTE — Telephone Encounter (Signed)
PT ASKED IF WE CAN AUTHORIZE HIS PAIN MEDS AND ANTIBIOTIC MED WITH MEDICAID SO HE CAN FILL HIS RX POST SURGERY PLEASE.

## 2016-07-20 NOTE — Telephone Encounter (Signed)
See message.

## 2016-07-20 NOTE — Progress Notes (Signed)
Discharge instructions reviewed with patient and wife. Patient able to answer questions re: post-op care, new meds, and f/u appointment. Printed AVS, prescriptions and sling given to patient. Awaiting ortho tech for instructions on sling use. Morning meds given. Patient to eat breakfast before discharge.

## 2016-07-20 NOTE — Telephone Encounter (Signed)
yes

## 2016-07-20 NOTE — Progress Notes (Signed)
Doing well.  Pain minimal.  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  Will keep on prophylactic abx and f/u on cultures.  May dc home today.

## 2016-07-21 NOTE — Telephone Encounter (Signed)
Bactrim ds tab. 1 tab po bid x 7 days.  Percocet #30

## 2016-07-21 NOTE — Discharge Summary (Signed)
Physician Discharge Summary      Patient ID: Jon Jones MRN: [id number] DOB/AGE: Mar 06, 1956 61 y.o.  Admit date: 07/19/2016 Discharge date: 07/21/2016  Admission Diagnoses:  <principal problem not specified>  Discharge Diagnoses:  Active Problems:   S/P debridement   Acute gout of left elbow   Past Medical History:  Diagnosis Date  . 5 years ago   . CHF (congestive heart failure) ([name])   . Coronary artery disease   . Diabetes mellitus without complication ([name])   . Dysrhythmia   . Peripheral vascular disease ([name])   . Sleep apnea     Surgeries: Procedure(s): IRRIGATION AND DEBRIDEMENT EXTREMITY/OLECRANON(WASHOUT) on 07/19/2016   Consultants (if any):   Discharged Condition: Improved  Hospital Course: Jon Jones is an 61 y.o. male who was admitted 07/19/2016 with a diagnosis of <principal problem not specified> and went to the operating room on 07/19/2016 and underwent the above named procedures.    He was given perioperative antibiotics:  Anti-infectives    Start     Dose/Rate Route Frequency Ordered Stop   07/20/16 0600  ceFAZolin (ANCEF) IVPB 2g/100 mL premix  Status:  Discontinued     2 g 200 mL/hr over 30 Minutes Intravenous On call to O.R. 07/19/16 1253 07/19/16 1416   07/20/16 0000  ceFAZolin (ANCEF) IVPB 2g/100 mL premix  Status:  Discontinued     2 g 200 mL/hr over 30 Minutes Intravenous Every 6 hours 07/19/16 2153 07/20/16 1223   07/19/16 0000  sulfamethoxazole-trimethoprim (BACTRIM DS,SEPTRA DS) 800-160 MG tablet     1 tablet Oral 2 times daily 07/19/16 1917      .  He was given sequential compression devices, early ambulation for DVT prophylaxis.  He benefited maximally from the hospital stay and there were no complications.    Recent vital signs:  Vitals:   07/19/16 2148 07/20/16 0513  BP: 130/70 110/72  Pulse: 90 78  Resp: 20 20  Temp: 98.6 F (37 C) 97.7 F (36.5 C)    Recent laboratory studies:  Lab Results  Component Value Date   HGB  15.6 07/19/2016   No results found for: WBC, PLT No results found for: INR Lab Results  Component Value Date   NA 141 07/19/2016   K 4.2 07/19/2016   CL 107 07/19/2016   BUN 21 (H) 07/19/2016   CREATININE 0.90 07/19/2016   GLUCOSE 111 (H) 07/19/2016    Discharge Medications:   Allergies as of 07/20/2016   No Known Allergies     Medication List    TAKE these medications   atorvastatin 80 MG tablet Commonly known as:  LIPITOR Take 80 mg by mouth every evening.   buPROPion 150 MG 12 hr tablet Commonly known as:  WELLBUTRIN SR Take 150 mg by mouth 2 (two) times daily at 8 am and 10 pm.   cephALEXin 500 MG capsule Commonly known as:  KEFLEX Take 1 capsule (500 mg total) by mouth 2 (two) times daily.   colchicine 0.6 MG tablet Take 1 tablet (0.6 mg total) by mouth 2 (two) times daily as needed (pain).   empagliflozin 25 MG Tabs tablet Commonly known as:  JARDIANCE Take 25 mg by mouth daily.   glipiZIDE 10 MG tablet Commonly known as:  GLUCOTROL Take 10 mg by mouth 2 (two) times daily at 8 am and 10 pm.   HYDROcodone-acetaminophen 5-325 MG tablet Commonly known as:  NORCO Take 1-2 tablets by mouth every 6 (six) hours as needed.   lisinopril  5 MG tablet Commonly known as:  PRINIVIL,ZESTRIL Take 5 mg by mouth daily.   metFORMIN 1000 MG tablet Commonly known as:  GLUCOPHAGE Take 1,000 mg by mouth 2 (two) times daily at 8 am and 10 pm.   metoprolol succinate 100 MG 24 hr tablet Commonly known as:  TOPROL-XL Take 100 mg by mouth every evening.   naproxen 375 MG tablet Commonly known as:  NAPROSYN Take 1 tablet (375 mg total) by mouth 2 (two) times daily.   nitroGLYCERIN 0.4 MG SL tablet Commonly known as:  NITROSTAT Place 0.4 mg under the tongue See admin instructions. Every 5 minutes as needed for chest pain. Max of 3 doses   sulfamethoxazole-trimethoprim 800-160 MG tablet Commonly known as:  BACTRIM DS,SEPTRA DS Take 1 tablet by mouth 2 (two) times daily.    tiotropium 18 MCG inhalation capsule Commonly known as:  SPIRIVA Place 1 capsule into inhaler and inhale at bedtime.       Diagnostic Studies: Dg Elbow Complete Left  Result Date: 07/14/2016 CLINICAL DATA:  Left posterior elbow pain and swelling for 3 days EXAM: LEFT ELBOW - COMPLETE 3+ VIEW COMPARISON:  None. FINDINGS: There is no evidence of fracture, dislocation, or joint effusion. There is no evidence of arthropathy or other focal bone abnormality. Soft tissue swelling along the dorsal aspect of the olecranon which may reflect olecranon bursitis. IMPRESSION: No acute osseous injury of the left elbow. Electronically Signed   By: Elige KoHetal  Patel   On: 07/14/2016 12:45    Disposition: 01-Home or Self Care  Discharge Instructions    Call MD / Call 911    Complete by:  As directed    If you experience chest pain or shortness of breath, CALL 911 and be transported to the hospital emergency room.  If you develope a fever above 101.5 F, pus (white drainage) or increased drainage or redness at the wound, or calf pain, call your surgeon's office.   Call MD / Call 911    Complete by:  As directed    If you experience chest pain or shortness of breath, CALL 911 and be transported to the hospital emergency room.  If you develope a fever above 101.5 F, pus (white drainage) or increased drainage or redness at the wound, or calf pain, call your surgeon's office.   Constipation Prevention    Complete by:  As directed    Drink plenty of fluids.  Prune juice may be helpful.  You may use a stool softener, such as Colace (over the counter) 100 mg twice a day.  Use MiraLax (over the counter) for constipation as needed.   Constipation Prevention    Complete by:  As directed    Drink plenty of fluids.  Prune juice may be helpful.  You may use a stool softener, such as Colace (over the counter) 100 mg twice a day.  Use MiraLax (over the counter) for constipation as needed.   Driving restrictions    Complete by:   As directed    No driving while taking narcotic pain meds.   Driving restrictions    Complete by:  As directed    No driving while taking narcotic pain meds.   Increase activity slowly as tolerated    Complete by:  As directed    Increase activity slowly as tolerated    Complete by:  As directed       Follow-up Information    Tarry KosXu, Xin Klawitter M, MD Follow up in 1 week(s).  Specialty:  Orthopedic Surgery Why:  For wound re-check Contact information: [address] [place] [address] [phone]            Signed: Glee Arvin 07/21/2016, 8:07 AM

## 2016-07-21 NOTE — Telephone Encounter (Signed)
What will you order exactly?

## 2016-07-21 NOTE — Telephone Encounter (Signed)
Call from patient concerning itching rash on the arm, side of recent surgery. No other area of body involved. Likely secondary to reaction to the iodoform skin prep. I recommended washing off the film with water or alcohol avoiding the incision site and taking benadryl over the counter one or two tablets 25 mg every 8 hours for itching. jen

## 2016-07-24 ENCOUNTER — Telehealth (INDEPENDENT_AMBULATORY_CARE_PROVIDER_SITE_OTHER): Payer: Self-pay | Admitting: Orthopaedic Surgery

## 2016-07-24 LAB — AEROBIC/ANAEROBIC CULTURE W GRAM STAIN (SURGICAL/DEEP WOUND): Culture: NO GROWTH

## 2016-07-24 LAB — AEROBIC/ANAEROBIC CULTURE (SURGICAL/DEEP WOUND)

## 2016-07-24 NOTE — Telephone Encounter (Signed)
Does the [place] track PA online portal working ?

## 2016-07-24 NOTE — Telephone Encounter (Signed)
PT NEEDS MEDICAID APPROVAL FOR HIS RX THAT WAS SENT IN PLEASE.

## 2016-07-25 MED ORDER — OXYCODONE-ACETAMINOPHEN 5-325 MG PO TABS: 1.0000 | ORAL_TABLET | Freq: Two times a day (BID) | ORAL | 0 refills | Status: DC | PRN

## 2016-07-25 NOTE — Telephone Encounter (Signed)
Yes, thanks

## 2016-07-25 NOTE — Addendum Note (Signed)
Addended by: Albertina ParrGARCIA, Avianah Pellman on: 07/25/2016 05:02 PM   Modules accepted: Orders

## 2016-07-26 MED ORDER — SULFAMETHOXAZOLE-TRIMETHOPRIM 800-160 MG PO TABS: ORAL_TABLET | ORAL | 0 refills | Status: DC

## 2016-07-26 NOTE — Telephone Encounter (Signed)
PA DONE for Oxycodone.  Confirmation #: [id number] W Prior Approval #: [id number]: [id number] Status: APPROVED  RX ready for pick up at the front desk. Pt aware.

## 2016-07-26 NOTE — Addendum Note (Signed)
Addended by: Albertina ParrGARCIA, Lanelle Lindo on: 07/26/2016 08:54 AM   Modules accepted: Orders

## 2016-07-27 ENCOUNTER — Telehealth (INDEPENDENT_AMBULATORY_CARE_PROVIDER_SITE_OTHER): Payer: Self-pay | Admitting: Orthopaedic Surgery

## 2016-07-27 ENCOUNTER — Encounter (INDEPENDENT_AMBULATORY_CARE_PROVIDER_SITE_OTHER): Payer: Self-pay | Admitting: Orthopaedic Surgery

## 2016-07-27 ENCOUNTER — Ambulatory Visit (INDEPENDENT_AMBULATORY_CARE_PROVIDER_SITE_OTHER): Payer: Medicaid Other | Admitting: Orthopaedic Surgery

## 2016-07-27 DIAGNOSIS — M1A0221 Idiopathic chronic gout, left elbow, with tophus (tophi): Secondary | ICD-10-CM

## 2016-07-27 MED ORDER — INDOMETHACIN 25 MG PO CAPS: 25.0000 mg | ORAL_CAPSULE | Freq: Two times a day (BID) | ORAL | 0 refills | Status: DC | PRN

## 2016-07-27 MED ORDER — COLCHICINE 0.6 MG PO TABS: 0.6000 mg | ORAL_TABLET | Freq: Two times a day (BID) | ORAL | 0 refills | Status: DC | PRN

## 2016-07-27 NOTE — Telephone Encounter (Signed)
Oxycodone 5 mg. 1-2 tab po q6h prn pain #30

## 2016-07-27 NOTE — Telephone Encounter (Signed)
Patient has been having issues with RX. All PA's have been done. [facility] PA is Pending through medicaid. Oxycodone and Oxycodone- Ace.  has been approved. Per patient he came back this PM and states he cannot take Tylenol bc he is allergic to it. He would like oxycodone with out tylenol. Please advise exactly what would you like to prescribe, quantity, etc to avoid further problems/delays. Thank you!

## 2016-07-27 NOTE — Progress Notes (Signed)
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 Overall he is doing well. He has not been able to fill his antibiotic or pain medicine due to insurance issues. Overall his pain is doing fine. He is not having any worsening. On exam his incision is clean dry and intact. We will remove the sutures next week. I did prescribe him indomethacin and colchicine. I'll see him back next week for wound check and suture removal.

## 2016-07-28 MED ORDER — OXYCODONE HCL 5 MG PO TABS: ORAL_TABLET | ORAL | 0 refills | Status: DC

## 2016-07-28 NOTE — Addendum Note (Signed)
Addended by: Albertina ParrGARCIA, Ndidi Nesby on: 07/28/2016 08:19 AM   Modules accepted: Orders

## 2016-07-28 NOTE — Telephone Encounter (Signed)
RX READY FOR PICK UP AT THE FRONT DESK, PT AWARE.

## 2016-08-03 ENCOUNTER — Ambulatory Visit (INDEPENDENT_AMBULATORY_CARE_PROVIDER_SITE_OTHER): Payer: Medicaid Other | Admitting: Orthopaedic Surgery

## 2016-08-03 DIAGNOSIS — M1A0221 Idiopathic chronic gout, left elbow, with tophus (tophi): Secondary | ICD-10-CM

## 2016-08-03 DIAGNOSIS — M7022 Olecranon bursitis, left elbow: Secondary | ICD-10-CM

## 2016-08-03 NOTE — Progress Notes (Signed)
Jon Jones comes back today for a postop wound check. He is overall doing well. The surgical incision is healed. He does have dark redness distally from the incision. This is not fluctuant. This is swollen. It does not cause any tenderness. He has full range of motion of the elbow without pain. I discussed with him that he should use warm compresses to help with the swelling and the redness. Does not appear to be infected. He needs to also limit resting his elbow on surfaces to help with this. Questions encouraged and answered. Follow-up with me as needed.

## 2016-08-04 DIAGNOSIS — H5203 Hypermetropia, bilateral: Secondary | ICD-10-CM | POA: Diagnosis not present

## 2016-08-04 DIAGNOSIS — H524 Presbyopia: Secondary | ICD-10-CM | POA: Diagnosis not present

## 2016-08-04 DIAGNOSIS — E119 Type 2 diabetes mellitus without complications: Secondary | ICD-10-CM | POA: Diagnosis not present

## 2016-08-11 DIAGNOSIS — I255 Ischemic cardiomyopathy: Secondary | ICD-10-CM | POA: Diagnosis not present

## 2016-09-01 ENCOUNTER — Other Ambulatory Visit (INDEPENDENT_AMBULATORY_CARE_PROVIDER_SITE_OTHER): Payer: Self-pay | Admitting: Orthopaedic Surgery

## 2016-09-04 NOTE — Telephone Encounter (Signed)
Ok to rf? 

## 2016-09-12 DIAGNOSIS — I255 Ischemic cardiomyopathy: Secondary | ICD-10-CM | POA: Diagnosis not present

## 2016-09-21 ENCOUNTER — Other Ambulatory Visit (INDEPENDENT_AMBULATORY_CARE_PROVIDER_SITE_OTHER): Payer: Self-pay | Admitting: Orthopaedic Surgery

## 2016-11-16 DIAGNOSIS — I2510 Atherosclerotic heart disease of native coronary artery without angina pectoris: Secondary | ICD-10-CM | POA: Diagnosis not present

## 2016-11-16 DIAGNOSIS — I70213 Atherosclerosis of native arteries of extremities with intermittent claudication, bilateral legs: Secondary | ICD-10-CM | POA: Diagnosis not present

## 2016-12-01 ENCOUNTER — Ambulatory Visit (HOSPITAL_COMMUNITY)
Admission: EM | Admit: 2016-12-01 | Discharge: 2016-12-01 | Disposition: A | Discharge status: Home / Self Care | Payer: Medicaid Other | Attending: Internal Medicine | Admitting: Internal Medicine

## 2016-12-01 ENCOUNTER — Encounter (HOSPITAL_COMMUNITY): Payer: Self-pay | Admitting: Emergency Medicine

## 2016-12-01 DIAGNOSIS — L245 Irritant contact dermatitis due to other chemical products: Secondary | ICD-10-CM

## 2016-12-01 MED ORDER — HYDROCORTISONE VALERATE 0.2 % EX OINT: 1.0000 "application " | TOPICAL_OINTMENT | Freq: Two times a day (BID) | CUTANEOUS | 0 refills | Status: DC

## 2016-12-01 MED ORDER — HYDROXYZINE HCL 25 MG PO TABS: 25.0000 mg | ORAL_TABLET | Freq: Four times a day (QID) | ORAL | 0 refills | Status: AC | PRN

## 2016-12-01 NOTE — Discharge Instructions (Signed)
Apply westcort on affected area. Hydroxyzine for itchiness. Follow skin care directions on information attached. Monitor for worsening of symptoms, spreading redness, increased warmth, increased pain, fever, follow up here or with PCP for further evaluation. If experiencing shortness of breath, swelling of the throat, trouble breathing, follow up at the emergency department for further evaluation.

## 2016-12-01 NOTE — ED Provider Notes (Signed)
[facility]    CSN: [id number] Arrival date & time: 12/01/16  1642     History   Chief Complaint Chief Complaint  Patient presents with  . Rash    HPI Jon Jones is a 61 y.o. male.   61 year old male with history of CHF, CAD, diabetes, sleep apnea comes in for 3 day history of rash on bilateral arm. Patient states he was using plywood  3 days ago when the rash onset. Patient states he found out that plywood was sprayed with chemicals, which he thinks caused the reaction. He states the rash is itchy in nature, denies pain. He has been putting Benadryl cream and antibiotic ointment without relief. Denies fever, chills, night sweats. Denies spreading erythema, increased warmth, pain. Denies shortness of breath, wheezing, swelling of the throat, trouble breathing, trouble swallowing. Patient with uncontrolled diabetes, states it is getting better, A1c going from 13 down to 8.3.      Past Medical History:  Diagnosis Date  . 5 years ago   . CHF (congestive heart failure) ([name])   . Coronary artery disease   . Diabetes mellitus without complication ([name])   . Dysrhythmia   . Peripheral vascular disease ([name])   . Sleep apnea     Patient Active Problem List   Diagnosis Date Noted  . 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 S/P debridement 07/19/2016  . Acute gout of left elbow 07/19/2016    Past Surgical History:  Procedure Laterality Date  . I&D EXTREMITY Left 07/19/2016   Procedure: IRRIGATION AND DEBRIDEMENT EXTREMITY/OLECRANON(WASHOUT);  Surgeon: Tarry Kos, MD;  Location: [facility];  Service: Orthopedics;  Laterality: Left;  . OLECRANON BURSECTOMY Left 07/19/2016   Procedure: LEFT ELBOW OLECRANON BURSECTOMY;  Surgeon: Tarry Kos, MD;  Location: [facility];  Service: Orthopedics;  Laterality: Left;       Home Medications    Prior to Admission medications   Medication Sig Start Date End Date Taking? Authorizing Provider    atorvastatin (LIPITOR) 80 MG tablet Take 80 mg by mouth every evening. 04/17/16 04/17/17 Yes [provider]  buPROPion (WELLBUTRIN SR) 150 MG 12 hr tablet Take 150 mg by mouth 2 (two) times daily at 8 am and 10 pm. 04/11/16  Yes [provider]  colchicine 0.6 MG tablet Take 1 tablet (0.6 mg total) by mouth 2 (two) times daily as needed (pain). 07/27/16  Yes Tarry Kos, MD  empagliflozin (JARDIANCE) 25 MG TABS tablet Take 25 mg by mouth daily. 07/03/16  Yes [provider]  glipiZIDE (GLUCOTROL) 10 MG tablet Take 10 mg by mouth 2 (two) times daily at 8 am and 10 pm. 04/03/16  Yes [provider]  indomethacin (INDOCIN) 25 MG capsule TAKE 1-2 CAPSULES (25-50 MG TOTAL) BY MOUTH 2 (TWO) TIMES DAILY AS NEEDED. 09/21/16  Yes Tarry Kos, MD  lisinopril (PRINIVIL,ZESTRIL) 5 MG tablet Take 5 mg by mouth daily. 04/17/16 04/17/17 Yes [provider]  metFORMIN (GLUCOPHAGE) 1000 MG tablet Take 1,000 mg by mouth 2 (two) times daily at 8 am and 10 pm. 04/13/16  Yes [provider]  metoprolol succinate (TOPROL-XL) 100 MG 24 hr tablet Take 100 mg by mouth every evening. 04/17/16 04/17/17 Yes [provider]  SITagliptin Phosphate (JANUVIA PO) Take by mouth.   Yes [provider]  tiotropium (SPIRIVA) 18 MCG inhalation capsule Place 1 capsule into inhaler and inhale at bedtime.  04/17/16 04/17/17 Yes [provider]  diphenhydrAMINE (BENADRYL) 25 mg capsule Take 1 capsule (25 mg total) by mouth every 6 (six) hours as needed. 07/20/16   Kerrin Champagne, MD  HYDROcodone-acetaminophen (NORCO) 5-325 MG tablet Take 1-2 tablets by mouth every 6 (six) hours as needed. 07/19/16   Tarry Kos, MD  hydrocortisone valerate ointment (WESTCORT) 0.2 % Apply 1 application topically 2 (two) times daily. 12/01/16   Cathie Hoops, Hasani Diemer V, PA-C  hydrOXYzine (ATARAX/VISTARIL) 25 MG tablet Take 1 tablet (25 mg total) by mouth every 6 (six) hours as needed for itching. 12/01/16 12/08/16   Cathie Hoops, Mitchael Luckey V, PA-C  naproxen (NAPROSYN) 375 MG tablet Take 1 tablet (375 mg total) by mouth 2 (two) times daily. 07/14/16   Gwyneth Sprout, MD  nitroGLYCERIN (NITROSTAT) 0.4 MG SL tablet Place 0.4 mg under the tongue See admin instructions. Every 5 minutes as needed for chest pain. Max of 3 doses 04/03/16   [provider]  oxyCODONE (OXY IR/ROXICODONE) 5 MG immediate release tablet 1-2 tab po q6h prn pain 07/28/16   Tarry Kos, MD  oxyCODONE-acetaminophen (PERCOCET/ROXICET) 5-325 MG tablet Take 1 tablet by mouth 2 (two) times daily as needed for severe pain. 07/25/16   Tarry Kos, MD    Family History History reviewed. No pertinent family history.  Social History Social History  Substance Use Topics  . Smoking status: Current Every Day Smoker    Packs/day: 1.00    Years: 40.00  . Smokeless tobacco: Never Used  . Alcohol use 1.2 oz/week    2 Cans of beer per week     Comment: 2 per week     Allergies   Patient has no known allergies.   Review of Systems Review of Systems  Reason unable to perform ROS: See HPI as above.     Physical Exam Triage Vital Signs ED Triage Vitals [12/01/16 1813]  Enc Vitals Group     BP (!) 93/52     Pulse Rate 79     Resp 20     Temp 97.8 F (36.6 C)     Temp Source Oral     SpO2 97 %     Weight      Height      Head Circumference      Peak Flow      Pain Score      Pain Loc      Pain Edu?      Excl. in GC?    No data found.   Updated Vital Signs BP (!) 93/52 (BP Location: Left Arm)   Pulse 79   Temp 97.8 F (36.6 C) (Oral)   Resp 20   SpO2 97%     Physical Exam  Constitutional: He is oriented to person, place, and time. He appears well-developed and well-nourished. No distress.  HENT:  Head: Normocephalic and atraumatic.  Eyes: Pupils are equal, round, and reactive to light. Conjunctivae are normal.  Neurological: He is alert and oriented to person, place, and time.  Skin:  See picture below. No pain on  palpation, mild increase in warmth.       UC Treatments / Results  Labs (all labs ordered are listed, but only abnormal results are displayed) Labs Reviewed - No data to display  EKG  EKG Interpretation None       Radiology No results found.  Procedures Procedures (including critical care time)  Medications Ordered in UC Medications - No data to display   Initial Impression /  Assessment and Plan / UC Course  I have reviewed the triage vital signs and the nursing notes.  Pertinent labs & imaging results that were available during my care of the patient were reviewed by me and considered in my medical decision making (see chart for details).    Discussed with patient history and exam most consistent with contact dermatitis. Given uncontrolled diabetes, will avoid oral steroids for now. Start Westcort on affected area. Hydroxyzine for itchiness. Skincare instructions given. Return precautions given.  Final Clinical Impressions(s) / UC Diagnoses   Final diagnoses:  Irritant contact dermatitis due to other chemical products    New Prescriptions New Prescriptions   HYDROCORTISONE VALERATE OINTMENT (WESTCORT) 0.2 %    Apply 1 application topically 2 (two) times daily.   HYDROXYZINE (ATARAX/VISTARIL) 25 MG TABLET    Take 1 tablet (25 mg total) by mouth every 6 (six) hours as needed for itching.      Belinda Fisher, PA-C 12/01/16 [phone]

## 2016-12-01 NOTE — ED Triage Notes (Signed)
Pt here for rash on bilateral arms onset 3-4 days   Believes it might have been due to handling some plywood that was sprayed w/some chemicals  Denies dyspnea, SOB  A&O x4... NAD... Ambulatory

## 2016-12-05 ENCOUNTER — Ambulatory Visit (HOSPITAL_COMMUNITY)
Admission: EM | Admit: 2016-12-05 | Discharge: 2016-12-05 | Disposition: A | Discharge status: Home / Self Care | Payer: Medicaid Other | Attending: Family Medicine | Admitting: Family Medicine

## 2016-12-05 ENCOUNTER — Encounter (HOSPITAL_COMMUNITY): Payer: Self-pay | Admitting: Emergency Medicine

## 2016-12-05 DIAGNOSIS — L237 Allergic contact dermatitis due to plants, except food: Secondary | ICD-10-CM

## 2016-12-05 DIAGNOSIS — I255 Ischemic cardiomyopathy: Secondary | ICD-10-CM | POA: Diagnosis not present

## 2016-12-05 MED ORDER — PREDNISONE 20 MG PO TABS: ORAL_TABLET | ORAL | 0 refills | Status: DC

## 2016-12-05 NOTE — ED Provider Notes (Signed)
[facility]   [id number] 12/05/16 Arrival Time: 1422   SUBJECTIVE:  Jon Jones is a 61 y.o. male who presents to the urgent care with complaint of  poison ivy. PT was given hydrocortisone cream. PT saw another physician today and that physician told him she thinks he needs prednisone, but she cannot prescribe it. PT reports rash has gotten worse since Friday.   He had been working in garden prior to the rash starting    Past Medical History:  Diagnosis Date  . 5 years ago   . CHF (congestive heart failure) ([name])   . Coronary artery disease   . Diabetes mellitus without complication ([name])   . Dysrhythmia   . Peripheral vascular disease ([name])   . Sleep apnea    No family history on file. Social History   Social History  . Marital status: Married    Spouse name: N/A  . Number of children: N/A  . Years of education: N/A   Occupational History  . Not on file.   Social History Main Topics  . Smoking status: Current Every Day Smoker    Packs/day: 1.00    Years: 40.00  . Smokeless tobacco: Never Used  . Alcohol use 1.2 oz/week    2 Cans of beer per week     Comment: 2 per week  . Drug use: Unknown  . Sexual activity: Not on file   Other Topics Concern  . Not on file   Social History Narrative  . No narrative on file   No outpatient prescriptions have been marked as taking for the 12/05/16 encounter [facility] Encounter).   No Known Allergies    ROS: As per HPI, remainder of ROS negative.   OBJECTIVE:   Vitals:   12/05/16 1532  BP: 123/66  Pulse: 81  Resp: 16  Temp: 98.5 F (36.9 C)  SpO2: 99%  Weight: 221 lb (100.2 kg)     General appearance: alert; no distress Eyes: PERRL; EOMI; conjunctiva normal HENT: normocephalic; atraumatic; TMs normal, canal normal, external ears normal without trauma; nasal mucosa normal; oral mucosa normal Neck: supple Lungs: clear to auscultation bilaterally Heart: regular rate and rhythm Abdomen: soft,  non-tender; bowel sounds normal; no masses or organomegaly; no guarding or rebound tenderness Back: no CVA tenderness Extremities: no cyanosis or edema; symmetrical with no gross deformities Skin: warm and dry with forearm vesicles and erythema in streaks bilaterally Neurologic: normal gait; grossly normal Psychological: alert and cooperative; normal mood and affect      Labs:  Results for orders placed or performed during the hospital encounter of 07/19/16  Aerobic/Anaerobic Culture (surgical/deep wound)  Result Value Ref Range   Specimen Description TISSUE    Special Requests LEFT ELBOW    Gram Stain      RARE WBC PRESENT, PREDOMINANTLY MONONUCLEAR NO ORGANISMS SEEN    Culture NO GROWTH 5 DAYS    Report Status 07/24/2016 FINAL   Glucose, capillary  Result Value Ref Range   Glucose-Capillary 72 65 - 99 mg/dL  Glucose, capillary  Result Value Ref Range   Glucose-Capillary 82 65 - 99 mg/dL  Glucose, capillary  Result Value Ref Range   Glucose-Capillary 115 (H) 65 - 99 mg/dL  Glucose, capillary  Result Value Ref Range   Glucose-Capillary 188 (H) 65 - 99 mg/dL  Glucose, capillary  Result Value Ref Range   Glucose-Capillary 134 (H) 65 - 99 mg/dL  I-STAT, chem 8  Result Value Ref Range   Sodium 141 135 -  145 mmol/L   Potassium 4.2 3.5 - 5.1 mmol/L   Chloride 107 101 - 111 mmol/L   BUN 21 (H) 6 - 20 mg/dL   Creatinine, Ser 1.61 0.61 - 1.24 mg/dL   Glucose, Bld 096 (H) 65 - 99 mg/dL   Calcium, Ion 0.45 4.09 - 1.40 mmol/L   TCO2 23 0 - 100 mmol/L   Hemoglobin 15.6 13.0 - 17.0 g/dL   HCT 81.1 91.4 - 78.2 %    Labs Reviewed - No data to display  No results found.     ASSESSMENT & PLAN:  1. Poison ivy dermatitis     Meds ordered this encounter  Medications  . predniSONE (DELTASONE) 20 MG tablet    Sig: Two daily with food    Dispense:  10 tablet    Refill:  0    Reviewed expectations re: course of current medical issues. Questions answered. Outlined  signs and symptoms indicating need for more acute intervention. Patient verbalized understanding. After Visit Summary given.       Elvina Sidle, MD 12/05/16 [phone]

## 2016-12-05 NOTE — ED Triage Notes (Signed)
PT was seen here Friday for poison ivy. PT was given hydrocortisone cream. PT saw another physician today and that physician told him she thinks he needs prednisone, but she cannot prescribe it. PT reports rash has gotten worse since Friday.

## 2016-12-13 DIAGNOSIS — E1169 Type 2 diabetes mellitus with other specified complication: Secondary | ICD-10-CM | POA: Diagnosis not present

## 2016-12-13 DIAGNOSIS — N521 Erectile dysfunction due to diseases classified elsewhere: Secondary | ICD-10-CM | POA: Diagnosis not present

## 2017-10-29 DIAGNOSIS — E119 Type 2 diabetes mellitus without complications: Secondary | ICD-10-CM | POA: Diagnosis not present

## 2017-10-29 DIAGNOSIS — J449 Chronic obstructive pulmonary disease, unspecified: Secondary | ICD-10-CM | POA: Diagnosis not present

## 2017-10-29 DIAGNOSIS — I255 Ischemic cardiomyopathy: Secondary | ICD-10-CM | POA: Diagnosis not present

## 2017-11-01 DIAGNOSIS — I255 Ischemic cardiomyopathy: Secondary | ICD-10-CM | POA: Diagnosis not present

## 2017-11-01 DIAGNOSIS — R0609 Other forms of dyspnea: Secondary | ICD-10-CM | POA: Diagnosis not present

## 2017-11-01 DIAGNOSIS — E782 Mixed hyperlipidemia: Secondary | ICD-10-CM | POA: Diagnosis not present

## 2017-11-01 DIAGNOSIS — G4733 Obstructive sleep apnea (adult) (pediatric): Secondary | ICD-10-CM | POA: Diagnosis not present

## 2017-11-01 DIAGNOSIS — I2510 Atherosclerotic heart disease of native coronary artery without angina pectoris: Secondary | ICD-10-CM | POA: Diagnosis not present

## 2017-11-01 DIAGNOSIS — I10 Essential (primary) hypertension: Secondary | ICD-10-CM | POA: Diagnosis not present

## 2017-11-01 DIAGNOSIS — I70213 Atherosclerosis of native arteries of extremities with intermittent claudication, bilateral legs: Secondary | ICD-10-CM | POA: Diagnosis not present

## 2017-12-07 DIAGNOSIS — G4733 Obstructive sleep apnea (adult) (pediatric): Secondary | ICD-10-CM | POA: Diagnosis not present

## 2017-12-18 DIAGNOSIS — G4733 Obstructive sleep apnea (adult) (pediatric): Secondary | ICD-10-CM | POA: Diagnosis not present

## 2017-12-20 DIAGNOSIS — I472 Ventricular tachycardia: Secondary | ICD-10-CM | POA: Diagnosis not present

## 2017-12-20 DIAGNOSIS — Z95810 Presence of automatic (implantable) cardiac defibrillator: Secondary | ICD-10-CM | POA: Diagnosis not present

## 2017-12-20 DIAGNOSIS — Z4502 Encounter for adjustment and management of automatic implantable cardiac defibrillator: Secondary | ICD-10-CM | POA: Diagnosis not present

## 2018-01-11 DIAGNOSIS — G4733 Obstructive sleep apnea (adult) (pediatric): Secondary | ICD-10-CM | POA: Diagnosis not present

## 2018-01-18 DIAGNOSIS — G4733 Obstructive sleep apnea (adult) (pediatric): Secondary | ICD-10-CM | POA: Diagnosis not present

## 2018-01-23 DIAGNOSIS — G4733 Obstructive sleep apnea (adult) (pediatric): Secondary | ICD-10-CM | POA: Diagnosis not present

## 2018-01-29 DIAGNOSIS — G4733 Obstructive sleep apnea (adult) (pediatric): Secondary | ICD-10-CM | POA: Diagnosis not present

## 2018-01-29 DIAGNOSIS — E119 Type 2 diabetes mellitus without complications: Secondary | ICD-10-CM | POA: Diagnosis not present

## 2018-01-31 DIAGNOSIS — I70213 Atherosclerosis of native arteries of extremities with intermittent claudication, bilateral legs: Secondary | ICD-10-CM | POA: Diagnosis not present

## 2018-01-31 DIAGNOSIS — I10 Essential (primary) hypertension: Secondary | ICD-10-CM | POA: Diagnosis not present

## 2018-01-31 DIAGNOSIS — E785 Hyperlipidemia, unspecified: Secondary | ICD-10-CM | POA: Diagnosis not present

## 2018-01-31 DIAGNOSIS — I2510 Atherosclerotic heart disease of native coronary artery without angina pectoris: Secondary | ICD-10-CM | POA: Diagnosis not present

## 2018-02-10 DIAGNOSIS — G4733 Obstructive sleep apnea (adult) (pediatric): Secondary | ICD-10-CM | POA: Diagnosis not present

## 2018-02-17 DIAGNOSIS — G4733 Obstructive sleep apnea (adult) (pediatric): Secondary | ICD-10-CM | POA: Diagnosis not present

## 2018-02-26 DIAGNOSIS — G4733 Obstructive sleep apnea (adult) (pediatric): Secondary | ICD-10-CM | POA: Diagnosis not present

## 2018-03-13 DIAGNOSIS — G4733 Obstructive sleep apnea (adult) (pediatric): Secondary | ICD-10-CM | POA: Diagnosis not present

## 2018-03-20 DIAGNOSIS — G4733 Obstructive sleep apnea (adult) (pediatric): Secondary | ICD-10-CM | POA: Diagnosis not present

## 2018-03-28 DIAGNOSIS — G4733 Obstructive sleep apnea (adult) (pediatric): Secondary | ICD-10-CM | POA: Diagnosis not present

## 2018-04-10 DIAGNOSIS — N5089 Other specified disorders of the male genital organs: Secondary | ICD-10-CM | POA: Diagnosis not present

## 2018-04-11 DIAGNOSIS — N5089 Other specified disorders of the male genital organs: Secondary | ICD-10-CM | POA: Diagnosis not present

## 2018-04-11 DIAGNOSIS — N509 Disorder of male genital organs, unspecified: Secondary | ICD-10-CM | POA: Diagnosis not present

## 2018-04-13 DIAGNOSIS — G4733 Obstructive sleep apnea (adult) (pediatric): Secondary | ICD-10-CM | POA: Diagnosis not present

## 2018-04-20 DIAGNOSIS — G4733 Obstructive sleep apnea (adult) (pediatric): Secondary | ICD-10-CM | POA: Diagnosis not present

## 2018-04-30 DIAGNOSIS — G4733 Obstructive sleep apnea (adult) (pediatric): Secondary | ICD-10-CM | POA: Diagnosis not present

## 2018-05-01 DIAGNOSIS — E119 Type 2 diabetes mellitus without complications: Secondary | ICD-10-CM | POA: Diagnosis not present

## 2018-05-01 DIAGNOSIS — I255 Ischemic cardiomyopathy: Secondary | ICD-10-CM | POA: Diagnosis not present

## 2018-05-01 DIAGNOSIS — I10 Essential (primary) hypertension: Secondary | ICD-10-CM | POA: Diagnosis not present

## 2018-05-01 DIAGNOSIS — I2510 Atherosclerotic heart disease of native coronary artery without angina pectoris: Secondary | ICD-10-CM | POA: Diagnosis not present

## 2018-05-01 DIAGNOSIS — M533 Sacrococcygeal disorders, not elsewhere classified: Secondary | ICD-10-CM | POA: Diagnosis not present

## 2018-05-06 DIAGNOSIS — Z4502 Encounter for adjustment and management of automatic implantable cardiac defibrillator: Secondary | ICD-10-CM | POA: Diagnosis not present

## 2018-05-06 DIAGNOSIS — Z95810 Presence of automatic (implantable) cardiac defibrillator: Secondary | ICD-10-CM | POA: Diagnosis not present

## 2018-05-12 DIAGNOSIS — G4733 Obstructive sleep apnea (adult) (pediatric): Secondary | ICD-10-CM | POA: Diagnosis not present

## 2018-05-19 DIAGNOSIS — G4733 Obstructive sleep apnea (adult) (pediatric): Secondary | ICD-10-CM | POA: Diagnosis not present

## 2018-05-30 DIAGNOSIS — G4733 Obstructive sleep apnea (adult) (pediatric): Secondary | ICD-10-CM | POA: Diagnosis not present

## 2018-06-12 DIAGNOSIS — G4733 Obstructive sleep apnea (adult) (pediatric): Secondary | ICD-10-CM | POA: Diagnosis not present

## 2018-06-19 DIAGNOSIS — G4733 Obstructive sleep apnea (adult) (pediatric): Secondary | ICD-10-CM | POA: Diagnosis not present

## 2018-07-03 DIAGNOSIS — G4733 Obstructive sleep apnea (adult) (pediatric): Secondary | ICD-10-CM | POA: Diagnosis not present

## 2018-07-05 DIAGNOSIS — E119 Type 2 diabetes mellitus without complications: Secondary | ICD-10-CM | POA: Diagnosis not present

## 2018-07-12 DIAGNOSIS — G4733 Obstructive sleep apnea (adult) (pediatric): Secondary | ICD-10-CM | POA: Diagnosis not present

## 2018-07-18 DIAGNOSIS — G4733 Obstructive sleep apnea (adult) (pediatric): Secondary | ICD-10-CM | POA: Diagnosis not present

## 2018-07-19 DIAGNOSIS — G4733 Obstructive sleep apnea (adult) (pediatric): Secondary | ICD-10-CM | POA: Diagnosis not present

## 2018-08-02 DIAGNOSIS — G4733 Obstructive sleep apnea (adult) (pediatric): Secondary | ICD-10-CM | POA: Diagnosis not present

## 2018-08-12 DIAGNOSIS — G4733 Obstructive sleep apnea (adult) (pediatric): Secondary | ICD-10-CM | POA: Diagnosis not present

## 2018-08-19 DIAGNOSIS — G4733 Obstructive sleep apnea (adult) (pediatric): Secondary | ICD-10-CM | POA: Diagnosis not present

## 2018-08-21 DIAGNOSIS — E119 Type 2 diabetes mellitus without complications: Secondary | ICD-10-CM | POA: Diagnosis not present

## 2018-09-02 DIAGNOSIS — Z95810 Presence of automatic (implantable) cardiac defibrillator: Secondary | ICD-10-CM | POA: Diagnosis not present

## 2018-09-02 DIAGNOSIS — G4733 Obstructive sleep apnea (adult) (pediatric): Secondary | ICD-10-CM | POA: Diagnosis not present

## 2018-09-02 DIAGNOSIS — Z4502 Encounter for adjustment and management of automatic implantable cardiac defibrillator: Secondary | ICD-10-CM | POA: Diagnosis not present

## 2018-09-11 DIAGNOSIS — G4733 Obstructive sleep apnea (adult) (pediatric): Secondary | ICD-10-CM | POA: Diagnosis not present

## 2018-09-12 DIAGNOSIS — I10 Essential (primary) hypertension: Secondary | ICD-10-CM | POA: Diagnosis not present

## 2018-09-12 DIAGNOSIS — E785 Hyperlipidemia, unspecified: Secondary | ICD-10-CM | POA: Diagnosis not present

## 2018-09-12 DIAGNOSIS — G4733 Obstructive sleep apnea (adult) (pediatric): Secondary | ICD-10-CM | POA: Diagnosis not present

## 2018-09-12 DIAGNOSIS — E119 Type 2 diabetes mellitus without complications: Secondary | ICD-10-CM | POA: Diagnosis not present

## 2018-09-12 DIAGNOSIS — I255 Ischemic cardiomyopathy: Secondary | ICD-10-CM | POA: Diagnosis not present

## 2018-09-12 DIAGNOSIS — E782 Mixed hyperlipidemia: Secondary | ICD-10-CM | POA: Diagnosis not present

## 2018-09-12 DIAGNOSIS — I2510 Atherosclerotic heart disease of native coronary artery without angina pectoris: Secondary | ICD-10-CM | POA: Diagnosis not present

## 2018-09-18 DIAGNOSIS — G4733 Obstructive sleep apnea (adult) (pediatric): Secondary | ICD-10-CM | POA: Diagnosis not present

## 2018-10-08 DIAGNOSIS — G4733 Obstructive sleep apnea (adult) (pediatric): Secondary | ICD-10-CM | POA: Diagnosis not present

## 2018-10-12 DIAGNOSIS — G4733 Obstructive sleep apnea (adult) (pediatric): Secondary | ICD-10-CM | POA: Diagnosis not present

## 2018-10-19 DIAGNOSIS — G4733 Obstructive sleep apnea (adult) (pediatric): Secondary | ICD-10-CM | POA: Diagnosis not present

## 2018-11-07 DIAGNOSIS — G4733 Obstructive sleep apnea (adult) (pediatric): Secondary | ICD-10-CM | POA: Diagnosis not present

## 2018-12-02 DIAGNOSIS — Z4502 Encounter for adjustment and management of automatic implantable cardiac defibrillator: Secondary | ICD-10-CM | POA: Diagnosis not present

## 2018-12-02 DIAGNOSIS — Z95810 Presence of automatic (implantable) cardiac defibrillator: Secondary | ICD-10-CM | POA: Diagnosis not present

## 2018-12-09 DIAGNOSIS — G4733 Obstructive sleep apnea (adult) (pediatric): Secondary | ICD-10-CM | POA: Diagnosis not present

## 2019-01-08 DIAGNOSIS — G4733 Obstructive sleep apnea (adult) (pediatric): Secondary | ICD-10-CM | POA: Diagnosis not present

## 2019-01-16 DIAGNOSIS — R413 Other amnesia: Secondary | ICD-10-CM | POA: Diagnosis not present

## 2019-01-16 DIAGNOSIS — E1142 Type 2 diabetes mellitus with diabetic polyneuropathy: Secondary | ICD-10-CM | POA: Diagnosis not present

## 2019-01-16 DIAGNOSIS — Z23 Encounter for immunization: Secondary | ICD-10-CM | POA: Diagnosis not present

## 2019-01-16 DIAGNOSIS — L409 Psoriasis, unspecified: Secondary | ICD-10-CM | POA: Diagnosis not present

## 2019-01-30 DIAGNOSIS — Z6837 Body mass index (BMI) 37.0-37.9, adult: Secondary | ICD-10-CM | POA: Diagnosis not present

## 2019-01-30 DIAGNOSIS — Z9989 Dependence on other enabling machines and devices: Secondary | ICD-10-CM | POA: Diagnosis not present

## 2019-01-30 DIAGNOSIS — G4733 Obstructive sleep apnea (adult) (pediatric): Secondary | ICD-10-CM | POA: Diagnosis not present

## 2019-02-11 DIAGNOSIS — G4733 Obstructive sleep apnea (adult) (pediatric): Secondary | ICD-10-CM | POA: Diagnosis not present

## 2019-02-21 DIAGNOSIS — E119 Type 2 diabetes mellitus without complications: Secondary | ICD-10-CM | POA: Diagnosis not present

## 2019-02-21 DIAGNOSIS — H524 Presbyopia: Secondary | ICD-10-CM | POA: Diagnosis not present

## 2019-02-22 ENCOUNTER — Encounter (HOSPITAL_COMMUNITY): Payer: Self-pay | Admitting: *Deleted

## 2019-02-22 ENCOUNTER — Other Ambulatory Visit: Payer: Self-pay

## 2019-02-22 ENCOUNTER — Ambulatory Visit (INDEPENDENT_AMBULATORY_CARE_PROVIDER_SITE_OTHER): Payer: Medicaid Other

## 2019-02-22 ENCOUNTER — Telehealth (HOSPITAL_COMMUNITY): Payer: Self-pay | Admitting: *Deleted

## 2019-02-22 ENCOUNTER — Ambulatory Visit (HOSPITAL_COMMUNITY)
Admission: EM | Admit: 2019-02-22 | Discharge: 2019-02-22 | Disposition: A | Discharge status: Home / Self Care | Payer: Medicaid Other | Attending: Emergency Medicine | Admitting: Emergency Medicine

## 2019-02-22 DIAGNOSIS — L97521 Non-pressure chronic ulcer of other part of left foot limited to breakdown of skin: Secondary | ICD-10-CM | POA: Diagnosis not present

## 2019-02-22 DIAGNOSIS — E11621 Type 2 diabetes mellitus with foot ulcer: Secondary | ICD-10-CM | POA: Diagnosis not present

## 2019-02-22 DIAGNOSIS — L03119 Cellulitis of unspecified part of limb: Secondary | ICD-10-CM

## 2019-02-22 DIAGNOSIS — L03116 Cellulitis of left lower limb: Secondary | ICD-10-CM

## 2019-02-22 DIAGNOSIS — R600 Localized edema: Secondary | ICD-10-CM | POA: Diagnosis not present

## 2019-02-22 HISTORY — DX: Essential (primary) hypertension: I10

## 2019-02-22 HISTORY — DX: Presence of automatic (implantable) cardiac defibrillator: Z95.810

## 2019-02-22 MED ORDER — SULFAMETHOXAZOLE-TRIMETHOPRIM 800-160 MG PO TABS: 1.0000 | ORAL_TABLET | Freq: Two times a day (BID) | ORAL | 0 refills | Status: AC

## 2019-02-22 MED ORDER — CLINDAMYCIN HCL 300 MG PO CAPS: 300.0000 mg | ORAL_CAPSULE | Freq: Three times a day (TID) | ORAL | 0 refills | Status: AC

## 2019-02-22 MED ORDER — CEPHALEXIN 500 MG PO CAPS: 500.0000 mg | ORAL_CAPSULE | Freq: Four times a day (QID) | ORAL | 0 refills | Status: AC

## 2019-02-22 NOTE — Discharge Instructions (Signed)
Begin keflex and bactrim for the next week Elevate foot Keep clean and dry If you develop increased redness swelling pain or drainage please go to the emergency room

## 2019-02-22 NOTE — ED Triage Notes (Signed)
Reports LLE edema every morning x 4-5 days onset approx 20 min after waking each morning.  Started with "hole" in left 5th toe 4-5 days ago as well.  Left 5th toe with small amt drainage; significant erythema in left foot.  Denies fevers.  States started taking old erythromycin abx few days ago.

## 2019-02-22 NOTE — Telephone Encounter (Signed)
Returned msg to [facility] @ [address]: pharmacist states there is a drug interaction with the Bactrim Rx and pt's current medication regimen.  Notified H. Wieters, PA: V.O. discontinue Rxs for cephalexin and Bactrim, and replace with just clindamycin 300mg  tid x 1 wk.  Pt notified of above.  Pt states he was already given Rx for cephalexin, but not the Bactrim.  Discussed with H. [place], [place].  Explained to pt that he really needs to have clindamycin if he cannot have both the Bactrim and cephalexin for bacterial coverage.  Pt verbalized understanding, states he does not wish to fill Rx tonight; per H. Wieters - pt is to take one dose of cephalexin tonight only, if he is waiting until tomorrow to fill clindamycin.  Pt notified to take one single dose of cephalexin tonight, then discontinue and start clindamycin Rx ASAP tomorrow morning.  Pt verbalized understanding.

## 2019-02-23 NOTE — ED Provider Notes (Signed)
[facility]    CSN: [id number] Arrival date & time: 02/22/19  1302      History   Chief Complaint Chief Complaint  Patient presents with  . Toe Pain    HPI Jon Jones is a 63 y.o. male history of CHF, CAD, DM type II, hypertension, peripheral vascular disease, presenting today for evaluation of wound to toe and foot swelling.  Patient states that over the past 4 to 5 days he has noticed a open wound to his left fifth toe.  His wife has been cleaning this daily.  He notes that he has had a small amount of drainage.  Over the past couple days he has had increased swelling and redness into his foot.  He denies significant pain.  Denies history of neuropathy secondary to diabetes.  He took a few tablets of erythromycin that were old a few days ago.  HPI  Past Medical History:  Diagnosis Date  . 5 years ago   . AICD (automatic cardioverter/defibrillator) present   . CHF (congestive heart failure) ([name])   . Coronary artery disease   . Diabetes mellitus without complication ([name])   . Dysrhythmia   . Hypertension   . Peripheral vascular disease ([name])   . Peripheral vascular disease ([name])   . Sleep apnea     Patient Active Problem List   Diagnosis Date Noted  . 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 S/P debridement 07/19/2016  . Acute gout of left elbow 07/19/2016    Past Surgical History:  Procedure Laterality Date  . CARDIAC DEFIBRILLATOR PLACEMENT    . I&D EXTREMITY Left 07/19/2016   Procedure: IRRIGATION AND DEBRIDEMENT EXTREMITY/OLECRANON(WASHOUT);  Surgeon: Tarry KosXu, Naiping M, MD;  [facility];  Service: Orthopedics;  Laterality: Left;  . OLECRANON BURSECTOMY Left 07/19/2016   Procedure: LEFT ELBOW OLECRANON BURSECTOMY;  Surgeon: Tarry KosXu, Naiping M, MD;  Location: [facility];  Service: Orthopedics;  Laterality: Left;       Home Medications    Prior to Admission medications   Medication Sig Start Date End Date Taking?  Authorizing Provider  atorvastatin (LIPITOR) 80 MG tablet Take 80 mg by mouth every evening. 04/17/16 02/22/19 Yes [provider]  buPROPion (WELLBUTRIN SR) 150 MG 12 hr tablet Take 150 mg by mouth 2 (two) times daily at 8 am and 10 pm. 04/11/16  Yes [provider]  empagliflozin (JARDIANCE) 25 MG TABS tablet Take 25 mg by mouth daily. 07/03/16  Yes [provider]  glipiZIDE (GLUCOTROL) 10 MG tablet Take 10 mg by mouth 2 (two) times daily at 8 am and 10 pm. 04/03/16  Yes [provider]  lisinopril (PRINIVIL,ZESTRIL) 5 MG tablet Take 5 mg by mouth daily. 04/17/16 02/22/19 Yes [provider]  metFORMIN (GLUCOPHAGE) 1000 MG tablet Take 1,000 mg by mouth 2 (two) times daily at 8 am and 10 pm. 04/13/16  Yes [provider]  metoprolol succinate (TOPROL-XL) 100 MG 24 hr tablet Take 100 mg by mouth every evening. 04/17/16 02/22/19 Yes [provider]  SITagliptin Phosphate (JANUVIA PO) Take by mouth.   Yes [provider]  cephALEXin (KEFLEX) 500 MG capsule Take 1 capsule (500 mg total) by mouth 4 (four) times daily for 7 days. 02/22/19 03/01/19  Burr Soffer C, PA-C  clindamycin (CLEOCIN) 300 MG capsule Take 1 capsule (300 mg total) by mouth 3 (three) times daily for 7 days. 02/22/19 03/01/19  Ethell Blatchford C, PA-C  colchicine 0.6  MG tablet Take 1 tablet (0.6 mg total) by mouth 2 (two) times daily as needed (pain). 07/27/16   Tarry Kos, MD  diphenhydrAMINE (BENADRYL) 25 mg capsule Take 1 capsule (25 mg total) by mouth every 6 (six) hours as needed. 07/20/16   Kerrin Champagne, MD  HYDROcodone-acetaminophen (NORCO) 5-325 MG tablet Take 1-2 tablets by mouth every 6 (six) hours as needed. 07/19/16   Tarry Kos, MD  hydrocortisone valerate ointment (WESTCORT) 0.2 % Apply 1 application topically 2 (two) times daily. 12/01/16   Cathie Hoops, Amy V, PA-C  indomethacin (INDOCIN) 25 MG capsule TAKE 1-2 CAPSULES (25-50 MG TOTAL) BY MOUTH 2 (TWO) TIMES DAILY  AS NEEDED. 09/21/16   Tarry Kos, MD  naproxen (NAPROSYN) 375 MG tablet Take 1 tablet (375 mg total) by mouth 2 (two) times daily. 07/14/16   Gwyneth Sprout, MD  nitroGLYCERIN (NITROSTAT) 0.4 MG SL tablet Place 0.4 mg under the tongue See admin instructions. Every 5 minutes as needed for chest pain. Max of 3 doses 04/03/16   [provider]  oxyCODONE (OXY IR/ROXICODONE) 5 MG immediate release tablet 1-2 tab po q6h prn pain 07/28/16   Tarry Kos, MD  oxyCODONE-acetaminophen (PERCOCET/ROXICET) 5-325 MG tablet Take 1 tablet by mouth 2 (two) times daily as needed for severe pain. 07/25/16   Tarry Kos, MD  predniSONE (DELTASONE) 20 MG tablet Two daily with food 12/05/16   Elvina Sidle, MD  sulfamethoxazole-trimethoprim (BACTRIM DS) 800-160 MG tablet Take 1 tablet by mouth 2 (two) times daily for 7 days. 02/22/19 03/01/19  Anhar Mcdermott C, PA-C  tiotropium (SPIRIVA) 18 MCG inhalation capsule Place 1 capsule into inhaler and inhale at bedtime.  04/17/16 04/17/17  [provider]    Family History Family History  Problem Relation Age of Onset  . Heart disease Mother   . Diabetes Mother   . Peripheral vascular disease Mother     Social History Social History   Tobacco Use  . Smoking status: Current Every Day Smoker    Packs/day: 1.00    Years: 40.00    Pack years: 40.00  . Smokeless tobacco: Never Used  Substance Use Topics  . Alcohol use: Not Currently  . Drug use: Never     Allergies   Patient has no known allergies.   Review of Systems Review of Systems  Constitutional: Negative for fatigue and fever.  Eyes: Negative for redness, itching and visual disturbance.  Respiratory: Negative for shortness of breath.   Cardiovascular: Negative for chest pain and leg swelling.  Gastrointestinal: Negative for nausea and vomiting.  Musculoskeletal: Positive for joint swelling. Negative for arthralgias and myalgias.  Skin: Positive for color change and wound.  Negative for rash.  Neurological: Negative for dizziness, syncope, weakness, light-headedness and headaches.     Physical Exam Triage Vital Signs ED Triage Vitals [02/22/19 1408]  Enc Vitals Group     BP 138/61     Pulse Rate 86     Resp 20     Temp 98.6 F (37 C)     Temp Source Oral     SpO2 97 %     Weight      Height      Head Circumference      Peak Flow      Pain Score 0     Pain Loc      Pain Edu?      Excl. in GC?    No data found.  Updated Vital  Signs BP 138/61   Pulse 86   Temp 98.6 F (37 C) (Oral)   Resp 20   SpO2 97%   Visual Acuity Right Eye Distance:   Left Eye Distance:   Bilateral Distance:    Right Eye Near:   Left Eye Near:    Bilateral Near:     Physical Exam Vitals and nursing note reviewed.  Constitutional:      Appearance: He is well-developed.     Comments: No acute distress  HENT:     Head: Normocephalic and atraumatic.     Nose: Nose normal.  Eyes:     Conjunctiva/sclera: Conjunctivae normal.  Cardiovascular:     Rate and Rhythm: Normal rate.  Pulmonary:     Effort: Pulmonary effort is normal. No respiratory distress.  Abdominal:     General: There is no distension.  Musculoskeletal:        General: Normal range of motion.     Cervical back: Neck supple.     Comments: Left foot: Full active range of motion at ankle and toes, dorsalis pedis 2+, sensation intact distally, mild swelling noted to distal dorsum of foot  Skin:    General: Skin is warm and dry.     Comments: 0.5 cm x 0.5 cm area of open wound with yellow appearing tissue with surrounding white appearing skin followed by erythema that extends to into distal dorsum of foot  Neurological:     Mental Status: He is alert and oriented to person, place, and time.        UC Treatments / Results  Labs (all labs ordered are listed, but only abnormal results are displayed) Labs Reviewed - No data to display  EKG   Radiology DG Foot Complete Left  Result  Date: 02/22/2019 CLINICAL DATA:  Left foot pain EXAM: LEFT FOOT - COMPLETE 3+ VIEW COMPARISON:  None. FINDINGS: No fracture or dislocation of the left foot. Joint spaces are generally well preserved. Diffuse soft tissue edema about the midfoot and forefoot. IMPRESSION: No fracture or dislocation of the left foot. Diffuse soft tissue edema about the midfoot and forefoot. Electronically Signed   By: Lauralyn Primes M.D.   On: 02/22/2019 14:53    Procedures Procedures (including critical care time)  Medications Ordered in UC Medications - No data to display  Initial Impression / Assessment and Plan / UC Course  I have reviewed the triage vital signs and the nursing notes.  Pertinent labs & imaging results that were available during my care of the patient were reviewed by me and considered in my medical decision making (see chart for details).     X-ray negative for signs of osteomyelitis.  Will treat for cellulitis.  Initially provided Keflex and Bactrim to cover strep and MRSA, given patient's medicines and interaction with Bactrim causing hypoglycemia and hyperkalemia informed by pharmacist, switching medicines to clindamycin 3 times a day x1 week.  Advised patient to keep a close eye on his symptoms if developing increased pain swelling redness drainage, spreading more towards ankle to follow-up in emergency room.  Discussed strict return precautions. Patient verbalized understanding and is agreeable with plan.  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 Provider   cephALEXin (KEFLEX)  500 MG capsule Take 1 capsule (500 mg total) by mouth 4 (four) times daily for 7 days. 28 capsule Caesar Mannella C, PA-C   sulfamethoxazole-trimethoprim (BACTRIM DS) 800-160 MG tablet Take 1 tablet by mouth 2 (two) times daily for 7 days. 14 tablet Kaedon Fanelli, [place] C, PA-C     PDMP not reviewed this encounter.   Janith Lima, [place] 02/23/19 [phone]

## 2019-03-03 DIAGNOSIS — Z4502 Encounter for adjustment and management of automatic implantable cardiac defibrillator: Secondary | ICD-10-CM | POA: Diagnosis not present

## 2019-03-03 DIAGNOSIS — Z95810 Presence of automatic (implantable) cardiac defibrillator: Secondary | ICD-10-CM | POA: Diagnosis not present

## 2019-03-11 ENCOUNTER — Inpatient Hospital Stay (HOSPITAL_COMMUNITY)
Admission: EM | Admit: 2019-03-11 | Discharge: 2019-03-13 | DRG: 617 | Disposition: A | Discharge status: Home / Self Care | Payer: Medicaid Other | Attending: Internal Medicine | Admitting: Internal Medicine

## 2019-03-11 ENCOUNTER — Encounter (HOSPITAL_COMMUNITY): Payer: Self-pay | Admitting: Emergency Medicine

## 2019-03-11 ENCOUNTER — Other Ambulatory Visit: Payer: Self-pay

## 2019-03-11 ENCOUNTER — Emergency Department (HOSPITAL_COMMUNITY): Payer: Medicaid Other

## 2019-03-11 ENCOUNTER — Other Ambulatory Visit: Payer: Self-pay | Admitting: Physician Assistant

## 2019-03-11 DIAGNOSIS — Z8249 Family history of ischemic heart disease and other diseases of the circulatory system: Secondary | ICD-10-CM

## 2019-03-11 DIAGNOSIS — I2510 Atherosclerotic heart disease of native coronary artery without angina pectoris: Secondary | ICD-10-CM | POA: Diagnosis present

## 2019-03-11 DIAGNOSIS — L97529 Non-pressure chronic ulcer of other part of left foot with unspecified severity: Secondary | ICD-10-CM | POA: Diagnosis not present

## 2019-03-11 DIAGNOSIS — Z7902 Long term (current) use of antithrombotics/antiplatelets: Secondary | ICD-10-CM

## 2019-03-11 DIAGNOSIS — Z95810 Presence of automatic (implantable) cardiac defibrillator: Secondary | ICD-10-CM | POA: Diagnosis not present

## 2019-03-11 DIAGNOSIS — I110 Hypertensive heart disease with heart failure: Secondary | ICD-10-CM | POA: Diagnosis not present

## 2019-03-11 DIAGNOSIS — M869 Osteomyelitis, unspecified: Secondary | ICD-10-CM | POA: Diagnosis not present

## 2019-03-11 DIAGNOSIS — Z7952 Long term (current) use of systemic steroids: Secondary | ICD-10-CM | POA: Diagnosis not present

## 2019-03-11 DIAGNOSIS — E1151 Type 2 diabetes mellitus with diabetic peripheral angiopathy without gangrene: Secondary | ICD-10-CM | POA: Diagnosis not present

## 2019-03-11 DIAGNOSIS — M1A9XX0 Chronic gout, unspecified, without tophus (tophi): Secondary | ICD-10-CM | POA: Diagnosis present

## 2019-03-11 DIAGNOSIS — E11621 Type 2 diabetes mellitus with foot ulcer: Secondary | ICD-10-CM | POA: Diagnosis present

## 2019-03-11 DIAGNOSIS — F17210 Nicotine dependence, cigarettes, uncomplicated: Secondary | ICD-10-CM | POA: Diagnosis present

## 2019-03-11 DIAGNOSIS — L97509 Non-pressure chronic ulcer of other part of unspecified foot with unspecified severity: Secondary | ICD-10-CM

## 2019-03-11 DIAGNOSIS — M868X7 Other osteomyelitis, ankle and foot: Secondary | ICD-10-CM | POA: Diagnosis not present

## 2019-03-11 DIAGNOSIS — E1169 Type 2 diabetes mellitus with other specified complication: Secondary | ICD-10-CM | POA: Diagnosis not present

## 2019-03-11 DIAGNOSIS — E119 Type 2 diabetes mellitus without complications: Secondary | ICD-10-CM | POA: Diagnosis not present

## 2019-03-11 DIAGNOSIS — I509 Heart failure, unspecified: Secondary | ICD-10-CM | POA: Diagnosis not present

## 2019-03-11 DIAGNOSIS — G4733 Obstructive sleep apnea (adult) (pediatric): Secondary | ICD-10-CM

## 2019-03-11 DIAGNOSIS — Z7984 Long term (current) use of oral hypoglycemic drugs: Secondary | ICD-10-CM

## 2019-03-11 DIAGNOSIS — Z20828 Contact with and (suspected) exposure to other viral communicable diseases: Secondary | ICD-10-CM | POA: Diagnosis not present

## 2019-03-11 DIAGNOSIS — E785 Hyperlipidemia, unspecified: Secondary | ICD-10-CM

## 2019-03-11 DIAGNOSIS — M109 Gout, unspecified: Secondary | ICD-10-CM

## 2019-03-11 DIAGNOSIS — I252 Old myocardial infarction: Secondary | ICD-10-CM | POA: Diagnosis not present

## 2019-03-11 DIAGNOSIS — Z833 Family history of diabetes mellitus: Secondary | ICD-10-CM

## 2019-03-11 DIAGNOSIS — E1140 Type 2 diabetes mellitus with diabetic neuropathy, unspecified: Secondary | ICD-10-CM | POA: Diagnosis not present

## 2019-03-11 DIAGNOSIS — Z6837 Body mass index (BMI) 37.0-37.9, adult: Secondary | ICD-10-CM

## 2019-03-11 DIAGNOSIS — Z79899 Other long term (current) drug therapy: Secondary | ICD-10-CM

## 2019-03-11 DIAGNOSIS — E669 Obesity, unspecified: Secondary | ICD-10-CM | POA: Diagnosis present

## 2019-03-11 DIAGNOSIS — Z7982 Long term (current) use of aspirin: Secondary | ICD-10-CM | POA: Diagnosis not present

## 2019-03-11 DIAGNOSIS — Z9989 Dependence on other enabling machines and devices: Secondary | ICD-10-CM | POA: Diagnosis not present

## 2019-03-11 LAB — COMPREHENSIVE METABOLIC PANEL
ALT: 51 U/L — ABNORMAL HIGH (ref 0–44)
AST: 34 U/L (ref 15–41)
Albumin: 3.7 g/dL (ref 3.5–5.0)
Alkaline Phosphatase: 51 U/L (ref 38–126)
Anion gap: 15 (ref 5–15)
BUN: 19 mg/dL (ref 8–23)
CO2: 18 mmol/L — ABNORMAL LOW (ref 22–32)
Calcium: 9.4 mg/dL (ref 8.9–10.3)
Chloride: 103 mmol/L (ref 98–111)
Creatinine, Ser: 1.11 mg/dL (ref 0.61–1.24)
GFR calc Af Amer: >60 mL/min (ref >60)
GFR calc non Af Amer: >60 mL/min (ref >60)
Glucose, Bld: 251 mg/dL — ABNORMAL HIGH (ref 70–99)
Potassium: 4.6 mmol/L (ref 3.5–5.1)
Sodium: 136 mmol/L (ref 135–145)
Total Bilirubin: 0.4 mg/dL (ref 0.3–1.2)
Total Protein: 7.1 g/dL (ref 6.5–8.1)

## 2019-03-11 LAB — HEMOGLOBIN A1C
Hgb A1c MFr Bld: 7.6 % — ABNORMAL HIGH (ref 4.8–5.6)
Mean Plasma Glucose: 171.42 mg/dL

## 2019-03-11 LAB — GLUCOSE, CAPILLARY
Glucose-Capillary: 96 mg/dL (ref 70–99)
Glucose-Capillary: 204 mg/dL — ABNORMAL HIGH (ref 70–99)

## 2019-03-11 LAB — CBG MONITORING, ED: Glucose-Capillary: 81 mg/dL (ref 70–99)

## 2019-03-11 LAB — CBC WITH DIFFERENTIAL/PLATELET
Abs Immature Granulocytes: 0.07 10*3/uL (ref 0.00–0.07)
Basophils Absolute: 0.1 10*3/uL (ref 0.0–0.1)
Basophils Relative: 1 %
Eosinophils Absolute: 0.1 10*3/uL (ref 0.0–0.5)
Eosinophils Relative: 1 %
HCT: 46.4 % (ref 39.0–52.0)
Hemoglobin: 14.4 g/dL (ref 13.0–17.0)
Immature Granulocytes: 1 %
Lymphocytes Relative: 30 %
Lymphs Abs: 2.7 10*3/uL (ref 0.7–4.0)
MCH: 30.6 pg (ref 26.0–34.0)
MCHC: 31 g/dL (ref 30.0–36.0)
MCV: 98.5 fL (ref 80.0–100.0)
Monocytes Absolute: 0.7 10*3/uL (ref 0.1–1.0)
Monocytes Relative: 8 %
Neutro Abs: 5.5 10*3/uL (ref 1.7–7.7)
Neutrophils Relative %: 59 %
Platelets: 206 10*3/uL (ref 150–400)
RBC: 4.71 MIL/uL (ref 4.22–5.81)
RDW: 13.5 % (ref 11.5–15.5)
WBC: 9.1 10*3/uL (ref 4.0–10.5)
nRBC: 0 % (ref 0.0–0.2)

## 2019-03-11 LAB — SEDIMENTATION RATE: Sed Rate: 11 mm/hr (ref 0–16)

## 2019-03-11 LAB — C-REACTIVE PROTEIN: CRP: 1.8 mg/dL — ABNORMAL HIGH (ref <1.0)

## 2019-03-11 LAB — SARS CORONAVIRUS 2 (TAT 6-24 HRS): SARS Coronavirus 2: NEGATIVE

## 2019-03-11 LAB — HIV ANTIBODY (ROUTINE TESTING W REFLEX): HIV Screen 4th Generation wRfx: NONREACTIVE

## 2019-03-11 MED ORDER — METOPROLOL SUCCINATE ER 100 MG PO TB24
100.0000 mg | ORAL_TABLET | Freq: Every evening | ORAL | Status: DC
  Administered 2019-03-12: 100 mg via ORAL
  Filled 2019-03-11 (×2): qty 1

## 2019-03-11 MED ORDER — ATORVASTATIN CALCIUM 80 MG PO TABS
80.0000 mg | ORAL_TABLET | Freq: Every evening | ORAL | Status: DC
  Administered 2019-03-11 – 2019-03-12 (×2): 80 mg via ORAL
  Filled 2019-03-11 (×2): qty 1

## 2019-03-11 MED ORDER — EZETIMIBE 10 MG PO TABS
10.0000 mg | ORAL_TABLET | Freq: Every day | ORAL | Status: DC
  Administered 2019-03-11 – 2019-03-12 (×2): 10 mg via ORAL
  Filled 2019-03-11 (×2): qty 1

## 2019-03-11 MED ORDER — SPIRONOLACTONE 12.5 MG HALF TABLET
12.5000 mg | ORAL_TABLET | Freq: Every day | ORAL | Status: DC
  Administered 2019-03-13: 12.5 mg via ORAL
  Filled 2019-03-11 (×2): qty 1

## 2019-03-11 MED ORDER — LISINOPRIL 5 MG PO TABS
5.0000 mg | ORAL_TABLET | Freq: Every day | ORAL | Status: DC
  Administered 2019-03-12 – 2019-03-13 (×2): 5 mg via ORAL
  Filled 2019-03-11 (×2): qty 1

## 2019-03-11 MED ORDER — NITROGLYCERIN 0.4 MG SL SUBL: 0.4000 mg | SUBLINGUAL_TABLET | SUBLINGUAL | Status: DC | PRN

## 2019-03-11 MED ORDER — NITROGLYCERIN 0.4 MG SL SUBL: 0.4000 mg | SUBLINGUAL_TABLET | SUBLINGUAL | Status: DC

## 2019-03-11 MED ORDER — ACETAMINOPHEN 325 MG PO TABS: 650.0000 mg | ORAL_TABLET | Freq: Four times a day (QID) | ORAL | Status: DC | PRN

## 2019-03-11 MED ORDER — CLOPIDOGREL BISULFATE 75 MG PO TABS
75.0000 mg | ORAL_TABLET | Freq: Every day | ORAL | Status: DC
  Administered 2019-03-13: 75 mg via ORAL
  Filled 2019-03-11: qty 1

## 2019-03-11 MED ORDER — SODIUM CHLORIDE 0.9% FLUSH
3.0000 mL | Freq: Two times a day (BID) | INTRAVENOUS | Status: DC
  Administered 2019-03-11 – 2019-03-13 (×3): 3 mL via INTRAVENOUS

## 2019-03-11 MED ORDER — ACETAMINOPHEN 650 MG RE SUPP: 650.0000 mg | Freq: Four times a day (QID) | RECTAL | Status: DC | PRN

## 2019-03-11 MED ORDER — INSULIN ASPART 100 UNIT/ML ~~LOC~~ SOLN
0.0000 [IU] | Freq: Every day | SUBCUTANEOUS | Status: DC
  Administered 2019-03-11 – 2019-03-12 (×2): 2 [IU] via SUBCUTANEOUS

## 2019-03-11 MED ORDER — TIOTROPIUM BROMIDE MONOHYDRATE 18 MCG IN CAPS: 1.0000 | ORAL_CAPSULE | Freq: Every day | RESPIRATORY_TRACT | Status: DC | PRN

## 2019-03-11 MED ORDER — CHLORHEXIDINE GLUCONATE 4 % EX LIQD: 60.0000 mL | Freq: Once | CUTANEOUS | Status: DC

## 2019-03-11 MED ORDER — CEFAZOLIN SODIUM-DEXTROSE 2-4 GM/100ML-% IV SOLN
2.0000 g | INTRAVENOUS | Status: AC
  Administered 2019-03-12: 2 g via INTRAVENOUS
  Filled 2019-03-11: qty 100

## 2019-03-11 MED ORDER — NICOTINE 21 MG/24HR TD PT24
21.0000 mg | MEDICATED_PATCH | Freq: Every day | TRANSDERMAL | Status: DC
  Administered 2019-03-11 – 2019-03-13 (×3): 21 mg via TRANSDERMAL
  Filled 2019-03-11 (×3): qty 1

## 2019-03-11 MED ORDER — ASPIRIN EC 81 MG PO TBEC
81.0000 mg | DELAYED_RELEASE_TABLET | Freq: Every day | ORAL | Status: DC
  Administered 2019-03-12 – 2019-03-13 (×2): 81 mg via ORAL
  Filled 2019-03-11 (×2): qty 1

## 2019-03-11 MED ORDER — HEPARIN SODIUM (PORCINE) 5000 UNIT/ML IJ SOLN
5000.0000 [IU] | Freq: Once | INTRAMUSCULAR | Status: AC
  Administered 2019-03-11: 5000 [IU] via SUBCUTANEOUS
  Filled 2019-03-11: qty 1

## 2019-03-11 MED ORDER — BUPROPION HCL ER (SR) 150 MG PO TB12
150.0000 mg | ORAL_TABLET | Freq: Two times a day (BID) | ORAL | Status: DC
  Administered 2019-03-11 – 2019-03-13 (×4): 150 mg via ORAL
  Filled 2019-03-11 (×4): qty 1

## 2019-03-11 MED ORDER — INSULIN ASPART 100 UNIT/ML ~~LOC~~ SOLN
0.0000 [IU] | Freq: Three times a day (TID) | SUBCUTANEOUS | Status: DC
  Administered 2019-03-12: 2 [IU] via SUBCUTANEOUS
  Administered 2019-03-12 – 2019-03-13 (×2): 8 [IU] via SUBCUTANEOUS
  Administered 2019-03-13: 2 [IU] via SUBCUTANEOUS

## 2019-03-11 NOTE — ED Notes (Signed)
Lunch tray ordered 

## 2019-03-11 NOTE — Consult Note (Signed)
ORTHOPAEDIC CONSULTATION  REQUESTING PHYSICIAN: Benjiman CorePickering, Nathan, MD  Chief Complaint: Pain and swelling ulceration left foot little toe.  HPI: Jon Jones is a 63 y.o. male who presents with 3-week history of swelling cellulitis ulceration drainage left little toe.  Past Medical History:  Diagnosis Date  . 5 years ago   . AICD (automatic cardioverter/defibrillator) present   . CHF (congestive heart failure) ([name])   . Coronary artery disease   . Diabetes mellitus without complication ([name])   . Dysrhythmia   . Hypertension   . Peripheral vascular disease ([name])   . Peripheral vascular disease ([name])   . Sleep apnea    Past Surgical History:  Procedure Laterality Date  . CARDIAC DEFIBRILLATOR PLACEMENT    . I & D EXTREMITY Left 07/19/2016   Procedure: IRRIGATION AND DEBRIDEMENT EXTREMITY/OLECRANON(WASHOUT);  Surgeon: Tarry KosXu, Naiping M, MD;  [facility];  Service: Orthopedics;  Laterality: Left;  . OLECRANON BURSECTOMY Left 07/19/2016   Procedure: LEFT ELBOW OLECRANON BURSECTOMY;  Surgeon: Tarry KosXu, Naiping M, MD;  Location: [facility];  Service: Orthopedics;  Laterality: Left;   Social History   Socioeconomic History  . Marital status: Married    Spouse name: Not on file  . Number of children: Not on file  . Years of education: Not on file  . Highest education level: Not on file  Occupational History  . Not on file  Tobacco Use  . Smoking status: Current Every Day Smoker    Packs/day: 1.00    Years: 40.00    Pack years: 40.00  . Smokeless tobacco: Never Used  Substance and Sexual Activity  . Alcohol use: Not Currently  . Drug use: Never  . Sexual activity: Not on file  Other Topics Concern  . Not on file  Social History Narrative  . Not on file   Social Determinants of Health   Financial Resource Strain:   . Difficulty of Paying Living Expenses: Not on file  Food Insecurity:   . Worried About Programme researcher, broadcasting/film/videounning Out of Food in the Last Year: Not on file  . Ran  Out of Food in the Last Year: Not on file  Transportation Needs:   . Lack of Transportation (Medical): Not on file  . Lack of Transportation (Non-Medical): Not on file  Physical Activity:   . Days of Exercise per Week: Not on file  . Minutes of Exercise per Session: Not on file  Stress:   . Feeling of Stress : Not on file  Social Connections:   . Frequency of Communication with Friends and Family: Not on file  . Frequency of Social Gatherings with Friends and Family: Not on file  . Attends Religious Services: Not on file  . Active Member of Clubs or Organizations: Not on file  . Attends BankerClub or Organization Meetings: Not on file  . Marital Status: Not on file   Family History  Problem Relation Age of Onset  . Heart disease Mother   . Diabetes Mother   . Peripheral vascular disease Mother    - negative except otherwise stated in the family history section No Known Allergies Prior to Admission medications   Medication Sig Start Date End Date Taking? Authorizing Provider  atorvastatin (LIPITOR) 80 MG tablet Take 80 mg by mouth every evening. 04/17/16 02/22/19  [provider]  buPROPion (WELLBUTRIN SR) 150 MG 12 hr tablet Take 150 mg by mouth 2 (two) times daily at 8 am and 10 pm. 04/11/16   [provider]  colchicine 0.6 MG tablet Take 1 tablet (0.6 mg total) by mouth 2 (two) times daily as needed (pain). 07/27/16   Tarry Kos, MD  diphenhydrAMINE (BENADRYL) 25 mg capsule Take 1 capsule (25 mg total) by mouth every 6 (six) hours as needed. 07/20/16   Kerrin Champagne, MD  empagliflozin (JARDIANCE) 25 MG TABS tablet Take 25 mg by mouth daily. 07/03/16   [provider]  glipiZIDE (GLUCOTROL) 10 MG tablet Take 10 mg by mouth 2 (two) times daily at 8 am and 10 pm. 04/03/16   [provider]  HYDROcodone-acetaminophen (NORCO) 5-325 MG tablet Take 1-2 tablets by mouth every 6 (six) hours as needed. 07/19/16   Tarry Kos, MD  hydrocortisone valerate ointment  (WESTCORT) 0.2 % Apply 1 application topically 2 (two) times daily. 12/01/16   Cathie Hoops, Amy V, PA-C  indomethacin (INDOCIN) 25 MG capsule TAKE 1-2 CAPSULES (25-50 MG TOTAL) BY MOUTH 2 (TWO) TIMES DAILY AS NEEDED. 09/21/16   Tarry Kos, MD  lisinopril (PRINIVIL,ZESTRIL) 5 MG tablet Take 5 mg by mouth daily. 04/17/16 02/22/19  [provider]  metFORMIN (GLUCOPHAGE) 1000 MG tablet Take 1,000 mg by mouth 2 (two) times daily at 8 am and 10 pm. 04/13/16   [provider]  metoprolol succinate (TOPROL-XL) 100 MG 24 hr tablet Take 100 mg by mouth every evening. 04/17/16 02/22/19  [provider]  naproxen (NAPROSYN) 375 MG tablet Take 1 tablet (375 mg total) by mouth 2 (two) times daily. 07/14/16   Gwyneth Sprout, MD  nitroGLYCERIN (NITROSTAT) 0.4 MG SL tablet Place 0.4 mg under the tongue See admin instructions. Every 5 minutes as needed for chest pain. Max of 3 doses 04/03/16   [provider]  oxyCODONE (OXY IR/ROXICODONE) 5 MG immediate release tablet 1-2 tab po q6h prn pain 07/28/16   Tarry Kos, MD  oxyCODONE-acetaminophen (PERCOCET/ROXICET) 5-325 MG tablet Take 1 tablet by mouth 2 (two) times daily as needed for severe pain. 07/25/16   Tarry Kos, MD  predniSONE (DELTASONE) 20 MG tablet Two daily with food 12/05/16   Elvina Sidle, MD  SITagliptin Phosphate (JANUVIA PO) Take by mouth.    [provider]  tiotropium (SPIRIVA) 18 MCG inhalation capsule Place 1 capsule into inhaler and inhale at bedtime.  04/17/16 04/17/17  [provider]   DG Toe 5th Left  Result Date: 03/11/2019 CLINICAL DATA:  Open sore to the little toe for 3 weeks.  Diabetes. EXAM: DG TOE 5TH LEFT COMPARISON:  02/22/2019 FINDINGS: New bony destruction to the middle phalanx and proximal phalanx head of the fifth digit with swollen soft tissues. No opaque foreign body. IMPRESSION: Osteomyelitis appearance of the proximal phalanx head and middle phalanx. Electronically Signed   By:  Marnee Spring M.D.   On: 03/11/2019 08:39   - pertinent xrays, CT, MRI studies were reviewed and independently interpreted  Positive ROS: All other systems have been reviewed and were otherwise negative with the exception of those mentioned in the HPI and as above.  Physical Exam: General: Alert, no acute distress Psychiatric: Patient is competent for consent with normal mood and affect Lymphatic: No axillary or cervical lymphadenopathy Cardiovascular: No pedal edema Respiratory: No cyanosis, no use of accessory musculature GI: No organomegaly, abdomen is soft and non-tender    Images:  @ENCIMAGES @  Labs:  Lab Results  Component Value Date   ESRSEDRATE 11 03/11/2019   REPTSTATUS 07/24/2016 FINAL 07/19/2016   GRAMSTAIN  07/19/2016  RARE WBC PRESENT, PREDOMINANTLY MONONUCLEAR NO ORGANISMS SEEN    CULT NO GROWTH 5 DAYS 07/19/2016    Lab Results  Component Value Date   ALBUMIN 3.7 03/11/2019    Neurologic: Patient does not have protective sensation bilateral lower extremities.   MUSCULOSKELETAL:   Skin: Examination there is sausage digit swelling of the left little toe with draining ulcer laterally.  Patient has a strong dorsalis pedis pulse.  Review of the radiographs shows chronic destructive changes of the left little toe consistent with chronic osteomyelitis.  There is no ascending cellulitis.  Patient has a history of type 2 diabetes and a history of tobacco use. 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  Risk and benefits were discussed including risk of the wound not healing.  Patient states he understands wished to proceed at this time we will plan for surgery tomorrow Wednesday.  Anticipate discharge to home once he is safe with ambulation nonweightbearing on the left foot.  Thank you for the consult and the opportunity to see Mr. Jon Jones, [place] [phone] 11:15 AM

## 2019-03-11 NOTE — H&P (View-Only) (Signed)
  ORTHOPAEDIC CONSULTATION  REQUESTING PHYSICIAN: Pickering, Nathan, MD  Chief Complaint: Pain and swelling ulceration left foot little toe.  HPI: Jon Jones is a 63 y.o. male who presents with 3-week history of swelling cellulitis ulceration drainage left little toe.  Past Medical History:  Diagnosis Date  . 5 years ago   . AICD (automatic cardioverter/defibrillator) present   . CHF (congestive heart failure) ([name])   . Coronary artery disease   . Diabetes mellitus without complication ([name])   . Dysrhythmia   . Hypertension   . Peripheral vascular disease ([name])   . Peripheral vascular disease ([name])   . Sleep apnea    Past Surgical History:  Procedure Laterality Date  . CARDIAC DEFIBRILLATOR PLACEMENT    . I & D EXTREMITY Left 07/19/2016   Procedure: IRRIGATION AND DEBRIDEMENT EXTREMITY/OLECRANON(WASHOUT);  Surgeon: Xu, Naiping M, MD;  Location: [facility] OR;  Service: Orthopedics;  Laterality: Left;  . OLECRANON BURSECTOMY Left 07/19/2016   Procedure: LEFT ELBOW OLECRANON BURSECTOMY;  Surgeon: Xu, Naiping M, MD;  Location:  SURGERY CENTER;  Service: Orthopedics;  Laterality: Left;   Social History   Socioeconomic History  . Marital status: Married    Spouse name: Not on file  . Number of children: Not on file  . Years of education: Not on file  . Highest education level: Not on file  Occupational History  . Not on file  Tobacco Use  . Smoking status: Current Every Day Smoker    Packs/day: 1.00    Years: 40.00    Pack years: 40.00  . Smokeless tobacco: Never Used  Substance and Sexual Activity  . Alcohol use: Not Currently  . Drug use: Never  . Sexual activity: Not on file  Other Topics Concern  . Not on file  Social History Narrative  . Not on file   Social Determinants of Health   Financial Resource Strain:   . Difficulty of Paying Living Expenses: Not on file  Food Insecurity:   . Worried About Running Out of Food in the Last Year: Not on file  . Ran  Out of Food in the Last Year: Not on file  Transportation Needs:   . Lack of Transportation (Medical): Not on file  . Lack of Transportation (Non-Medical): Not on file  Physical Activity:   . Days of Exercise per Week: Not on file  . Minutes of Exercise per Session: Not on file  Stress:   . Feeling of Stress : Not on file  Social Connections:   . Frequency of Communication with Friends and Family: Not on file  . Frequency of Social Gatherings with Friends and Family: Not on file  . Attends Religious Services: Not on file  . Active Member of Clubs or Organizations: Not on file  . Attends Club or Organization Meetings: Not on file  . Marital Status: Not on file   Family History  Problem Relation Age of Onset  . Heart disease Mother   . Diabetes Mother   . Peripheral vascular disease Mother    - negative except otherwise stated in the family history section No Known Allergies Prior to Admission medications   Medication Sig Start Date End Date Taking? Authorizing Provider  atorvastatin (LIPITOR) 80 MG tablet Take 80 mg by mouth every evening. 04/17/16 02/22/19  [provider]  buPROPion (WELLBUTRIN SR) 150 MG 12 hr tablet Take 150 mg by mouth 2 (two) times daily at 8 am and 10 pm. 04/11/16   [provider]  colchicine 0.6 MG tablet Take 1 tablet (0.6 mg total) by mouth 2 (two) times daily as needed (pain). 07/27/16   Tarry Kos, MD  diphenhydrAMINE (BENADRYL) 25 mg capsule Take 1 capsule (25 mg total) by mouth every 6 (six) hours as needed. 07/20/16   Kerrin Champagne, MD  empagliflozin (JARDIANCE) 25 MG TABS tablet Take 25 mg by mouth daily. 07/03/16   [provider]  glipiZIDE (GLUCOTROL) 10 MG tablet Take 10 mg by mouth 2 (two) times daily at 8 am and 10 pm. 04/03/16   [provider]  HYDROcodone-acetaminophen (NORCO) 5-325 MG tablet Take 1-2 tablets by mouth every 6 (six) hours as needed. 07/19/16   Tarry Kos, MD  hydrocortisone valerate ointment  (WESTCORT) 0.2 % Apply 1 application topically 2 (two) times daily. 12/01/16   Cathie Hoops, Amy V, PA-C  indomethacin (INDOCIN) 25 MG capsule TAKE 1-2 CAPSULES (25-50 MG TOTAL) BY MOUTH 2 (TWO) TIMES DAILY AS NEEDED. 09/21/16   Tarry Kos, MD  lisinopril (PRINIVIL,ZESTRIL) 5 MG tablet Take 5 mg by mouth daily. 04/17/16 02/22/19  [provider]  metFORMIN (GLUCOPHAGE) 1000 MG tablet Take 1,000 mg by mouth 2 (two) times daily at 8 am and 10 pm. 04/13/16   [provider]  metoprolol succinate (TOPROL-XL) 100 MG 24 hr tablet Take 100 mg by mouth every evening. 04/17/16 02/22/19  [provider]  naproxen (NAPROSYN) 375 MG tablet Take 1 tablet (375 mg total) by mouth 2 (two) times daily. 07/14/16   Gwyneth Sprout, MD  nitroGLYCERIN (NITROSTAT) 0.4 MG SL tablet Place 0.4 mg under the tongue See admin instructions. Every 5 minutes as needed for chest pain. Max of 3 doses 04/03/16   [provider]  oxyCODONE (OXY IR/ROXICODONE) 5 MG immediate release tablet 1-2 tab po q6h prn pain 07/28/16   Tarry Kos, MD  oxyCODONE-acetaminophen (PERCOCET/ROXICET) 5-325 MG tablet Take 1 tablet by mouth 2 (two) times daily as needed for severe pain. 07/25/16   Tarry Kos, MD  predniSONE (DELTASONE) 20 MG tablet Two daily with food 12/05/16   Elvina Sidle, MD  SITagliptin Phosphate (JANUVIA PO) Take by mouth.    [provider]  tiotropium (SPIRIVA) 18 MCG inhalation capsule Place 1 capsule into inhaler and inhale at bedtime.  04/17/16 04/17/17  [provider]   DG Toe 5th Left  Result Date: 03/11/2019 CLINICAL DATA:  Open sore to the little toe for 3 weeks.  Diabetes. EXAM: DG TOE 5TH LEFT COMPARISON:  02/22/2019 FINDINGS: New bony destruction to the middle phalanx and proximal phalanx head of the fifth digit with swollen soft tissues. No opaque foreign body. IMPRESSION: Osteomyelitis appearance of the proximal phalanx head and middle phalanx. Electronically Signed   By:  Marnee Spring M.D.   On: 03/11/2019 08:39   - pertinent xrays, CT, MRI studies were reviewed and independently interpreted  Positive ROS: All other systems have been reviewed and were otherwise negative with the exception of those mentioned in the HPI and as above.  Physical Exam: General: Alert, no acute distress Psychiatric: Patient is competent for consent with normal mood and affect Lymphatic: No axillary or cervical lymphadenopathy Cardiovascular: No pedal edema Respiratory: No cyanosis, no use of accessory musculature GI: No organomegaly, abdomen is soft and non-tender    Images:  @ENCIMAGES @  Labs:  Lab Results  Component Value Date   ESRSEDRATE 11 03/11/2019   REPTSTATUS 07/24/2016 FINAL 07/19/2016   GRAMSTAIN  07/19/2016  RARE WBC PRESENT, PREDOMINANTLY MONONUCLEAR NO ORGANISMS SEEN    CULT NO GROWTH 5 DAYS 07/19/2016    Lab Results  Component Value Date   ALBUMIN 3.7 03/11/2019    Neurologic: Patient does not have protective sensation bilateral lower extremities.   MUSCULOSKELETAL:   Skin: Examination there is sausage digit swelling of the left little toe with draining ulcer laterally.  Patient has a strong dorsalis pedis pulse.  Review of the radiographs shows chronic destructive changes of the left little toe consistent with chronic osteomyelitis.  There is no ascending cellulitis.  Patient has a history of type 2 diabetes and a history of tobacco use. 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  Risk and benefits were discussed including risk of the wound not healing.  Patient states he understands wished to proceed at this time we will plan for surgery tomorrow Wednesday.  Anticipate discharge to home once he is safe with ambulation nonweightbearing on the left foot.  Thank you for the consult and the opportunity to see Mr. Zakariya Knickerbocker, [place] [phone] 11:15 AM

## 2019-03-11 NOTE — ED Provider Notes (Signed)
[facility] Provider Note   CSN: [id number] Arrival date & time: 03/11/19  [id number]     History Chief Complaint  Patient presents with  . foot infection    Norberta KeensOmar Grivas is a 63 y.o. male.  HPI Patient presents with a left fifth toe infection.  Had been seen in urgent care around 17 days ago after few days of infection.  Had been on clindamycin and then started Keflex that he had.  No pain.  States it is still draining pus.  No pain.  He is diabetic.  States there is redness on the foot.  States it will swell more particularly if he is standing.  States he was told to come the ER for did not get any better.  Sugars been doing well at home.  No fevers or chills.  States an x-ray was done 2 weeks ago that did not show bony infection at that time.    Past Medical History:  Diagnosis Date  . 5 years ago   . AICD (automatic cardioverter/defibrillator) present   . CHF (congestive heart failure) ([name])   . Coronary artery disease   . Diabetes mellitus without complication ([name])   . Dysrhythmia   . Hypertension   . Peripheral vascular disease ([name])   . Peripheral vascular disease ([name])   . Sleep apnea     Patient Active Problem List   Diagnosis Date Noted  . 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 S/P debridement 07/19/2016  . Acute gout of left elbow 07/19/2016    Past Surgical History:  Procedure Laterality Date  . CARDIAC DEFIBRILLATOR PLACEMENT    . I & D EXTREMITY Left 07/19/2016   Procedure: IRRIGATION AND DEBRIDEMENT EXTREMITY/OLECRANON(WASHOUT);  Surgeon: Tarry KosXu, Naiping M, MD;  [facility] OR;  Service: Orthopedics;  Laterality: Left;  . OLECRANON BURSECTOMY Left 07/19/2016   Procedure: LEFT ELBOW OLECRANON BURSECTOMY;  Surgeon: Tarry KosXu, Naiping M, MD;  Location: [facility];  Service: Orthopedics;  Laterality: Left;       Family History  Problem Relation Age of Onset  . Heart disease Mother   .  Diabetes Mother   . Peripheral vascular disease Mother     Social History   Tobacco Use  . Smoking status: Current Every Day Smoker    Packs/day: 1.00    Years: 40.00    Pack years: 40.00  . Smokeless tobacco: Never Used  Substance Use Topics  . Alcohol use: Not Currently  . Drug use: Never    Home Medications Prior to Admission medications   Medication Sig Start Date End Date Taking? Authorizing Provider  atorvastatin (LIPITOR) 80 MG tablet Take 80 mg by mouth every evening. 04/17/16 02/22/19  [provider]  buPROPion (WELLBUTRIN SR) 150 MG 12 hr tablet Take 150 mg by mouth 2 (two) times daily at 8 am and 10 pm. 04/11/16   [provider]  colchicine 0.6 MG tablet Take 1 tablet (0.6 mg total) by mouth 2 (two) times daily as needed (pain). 07/27/16   Tarry KosXu, Naiping M, MD  diphenhydrAMINE (BENADRYL) 25 mg capsule Take 1 capsule (25 mg total) by mouth every 6 (six) hours as needed. 07/20/16   Kerrin ChampagneNitka, James E, MD  empagliflozin (JARDIANCE) 25 MG TABS tablet Take 25 mg by mouth daily. 07/03/16   [provider]  glipiZIDE (GLUCOTROL) 10 MG tablet Take 10 mg by mouth 2 (two) times daily at 8 am and 10 pm. 04/03/16  [provider]  HYDROcodone-acetaminophen (NORCO) 5-325 MG tablet Take 1-2 tablets by mouth every 6 (six) hours as needed. 07/19/16   Leandrew Koyanagi, MD  hydrocortisone valerate ointment (WESTCORT) 0.2 % Apply 1 application topically 2 (two) times daily. 12/01/16   Tasia Catchings, Amy V, PA-C  indomethacin (INDOCIN) 25 MG capsule TAKE 1-2 CAPSULES (25-50 MG TOTAL) BY MOUTH 2 (TWO) TIMES DAILY AS NEEDED. 09/21/16   Leandrew Koyanagi, MD  lisinopril (PRINIVIL,ZESTRIL) 5 MG tablet Take 5 mg by mouth daily. 04/17/16 02/22/19  [provider]  metFORMIN (GLUCOPHAGE) 1000 MG tablet Take 1,000 mg by mouth 2 (two) times daily at 8 am and 10 pm. 04/13/16   [provider]  metoprolol succinate (TOPROL-XL) 100 MG 24 hr tablet Take 100 mg by mouth every evening. 04/17/16  02/22/19  [provider]  naproxen (NAPROSYN) 375 MG tablet Take 1 tablet (375 mg total) by mouth 2 (two) times daily. 07/14/16   Blanchie Dessert, MD  nitroGLYCERIN (NITROSTAT) 0.4 MG SL tablet Place 0.4 mg under the tongue See admin instructions. Every 5 minutes as needed for chest pain. Max of 3 doses 04/03/16   [provider]  oxyCODONE (OXY IR/ROXICODONE) 5 MG immediate release tablet 1-2 tab po q6h prn pain 07/28/16   Leandrew Koyanagi, MD  oxyCODONE-acetaminophen (PERCOCET/ROXICET) 5-325 MG tablet Take 1 tablet by mouth 2 (two) times daily as needed for severe pain. 07/25/16   Leandrew Koyanagi, MD  predniSONE (DELTASONE) 20 MG tablet Two daily with food 12/05/16   Robyn Haber, MD  SITagliptin Phosphate (JANUVIA PO) Take by mouth.    [provider]  tiotropium (SPIRIVA) 18 MCG inhalation capsule Place 1 capsule into inhaler and inhale at bedtime.  04/17/16 04/17/17  [provider]    Allergies    Patient has no known allergies.  Review of Systems   Review of Systems  Constitutional: Negative for appetite change and fever.  HENT: Negative for congestion.   Respiratory: Negative for shortness of breath.   Cardiovascular: Negative for chest pain.  Genitourinary: Negative for flank pain.  Musculoskeletal: Negative for gait problem and joint swelling.  Skin: Positive for wound.  Neurological: Negative for weakness.  Psychiatric/Behavioral: Negative for confusion.    Physical Exam Updated Vital Signs BP (!) 147/77 (BP Location: Right Arm)   Pulse 82   Temp 98.2 F (36.8 C) (Oral)   Resp 16   SpO2 98%   Physical Exam Vitals and nursing note reviewed.  HENT:     Head: Atraumatic.  Eyes:     General: No scleral icterus. Cardiovascular:     Rate and Rhythm: Regular rhythm.  Pulmonary:     Effort: No respiratory distress.  Abdominal:     Tenderness: There is no abdominal tenderness.  Musculoskeletal:     Cervical back: Neck supple.      Comments: Ulcer on lateral left fifth toe.  With pressure there is purulent drainage.  Some erythema on toe and up on foot.  No fluctuance.  Skin:    Capillary Refill: Capillary refill takes less than 2 seconds.  Neurological:     Mental Status: He is alert and oriented to person, place, and time.       ED Results / Procedures / Treatments   Labs (all labs ordered are listed, but only abnormal results are displayed) Labs Reviewed  COMPREHENSIVE METABOLIC PANEL - Abnormal; Notable for the following components:      Result Value   CO2 18 (*)  Glucose, Bld 251 (*)    ALT 51 (*)    All other components within normal limits  CBC WITH DIFFERENTIAL/PLATELET  SEDIMENTATION RATE  C-REACTIVE PROTEIN    EKG None  Radiology DG Toe 5th Left  Result Date: 03/11/2019 CLINICAL DATA:  Open sore to the little toe for 3 weeks.  Diabetes. EXAM: DG TOE 5TH LEFT COMPARISON:  02/22/2019 FINDINGS: New bony destruction to the middle phalanx and proximal phalanx head of the fifth digit with swollen soft tissues. No opaque foreign body. IMPRESSION: Osteomyelitis appearance of the proximal phalanx head and middle phalanx. Electronically Signed   By: Marnee Spring M.D.   On: 03/11/2019 08:39    Procedures Procedures (including critical care time)  Medications Ordered in ED Medications - No data to display  ED Course  I have reviewed the triage vital signs and the nursing notes.  Pertinent labs & imaging results that were available during my care of the patient were reviewed by me and considered in my medical decision making (see chart for details).    MDM Rules/Calculators/A&P                      Patient with toe infection.  Has been on clindamycin and then Keflex.  X-ray done and shows osteomyelitis.  White count reassuring but sugar is elevated.  Has had failure of outpatient robotics.  Seen by Ortho.  Likely surgery tomorrow.  Recommends admission to hospital.  Final Clinical  Impression(s) / ED Diagnoses Final diagnoses:  Osteomyelitis of fifth toe of left foot ([name])  Type 2 diabetes mellitus with foot ulcer, without long-term current use of insulin [facility])    Rx / DC Orders ED Discharge Orders    None       Benjiman Core, MD 03/11/19 1016

## 2019-03-11 NOTE — ED Notes (Signed)
Admitting team at bedside.

## 2019-03-11 NOTE — Progress Notes (Signed)
Set patient up on CPAP 8.0 CMH20 with full face mask patient states that's what he wears at home.  Patient instructed on how to remove our mask in case of emergency.  Patient tolerating well at this time.

## 2019-03-11 NOTE — ED Triage Notes (Addendum)
Pt seen at [facility] for diabetic ulcer of left 5th toe on 12/12.  States he completed full Rx of Clindamycin and then started Keflex since the area still looked infected and draining pus.    Denies pain.  Told to come to ED if not better.

## 2019-03-11 NOTE — H&P (Addendum)
Date: 03/11/2019               Patient Name:  Jon Jones MRN: [id number]  DOB: 1956-03-11 Age / Sex: 63 y.o., male   PCP: System, Provider Not In         Medical Service: Internal Medicine Teaching Service         Attending Physician: Dr. Sid Falcon, MD    First Contact: Dr. Eileen Stanford Pager: [phone]  Second Contact: Dr. Sheppard Coil Pager: [phone]       After Hours (After 5p/  First Contact Pager: [phone]  weekends / holidays): Second Contact Pager: [phone]   Chief Complaint: Toe Infection  History of Present Illness: Jon Jones is a 63 yo M with a Hx of DM, HTN, CAD, PVD, CHF (s/p AICD), and Gout who presented with persistent prurent drainage, swelling and redness of his left fifth toe. He was seen at urgent care on 02/22/2019 for this diabetic foot ulcer that was draining purulent fluid. X-ray at that time was negative for osteomyelitis. He was discharged on clindamycin per their note. He has been taking the antibiotic and his swelling and foot swelling has improved but the purulent drainage and swelling of his L 5th toe has persisted. He denies fevers, chills. CP, SOB, Abdominal, Constipation, Nausea, Edema, or orthopnea  ED Course: CMP showed ALT 51, Glucose 150, otherwise WNL. CBC WNL, ESR WNL. CRP and Coronavirus labs pending. X-ray of his left fifth toe now shows changes consistent with osteomyelitis. Ortho was consulted in the ED and recommend admission for surgery tomorrow with Dr. Sharol Given.   Meds:  Current Meds  Medication Sig  . atorvastatin (LIPITOR) 80 MG tablet Take 80 mg by mouth every evening.  Marland Kitchen buPROPion (WELLBUTRIN SR) 150 MG 12 hr tablet Take 150 mg by mouth 2 (two) times daily at 8 am and 10 pm.  . empagliflozin (JARDIANCE) 25 MG TABS tablet Take 25 mg by mouth daily.  Marland Kitchen glipiZIDE (GLUCOTROL) 10 MG tablet Take 10 mg by mouth 2 (two) times daily at 8 am and 10 pm.  . lisinopril (PRINIVIL,ZESTRIL) 5 MG tablet Take 5 mg by mouth daily.  . metFORMIN (GLUCOPHAGE)  1000 MG tablet Take 1,000 mg by mouth 2 (two) times daily at 8 am and 10 pm.  . metoprolol succinate (TOPROL-XL) 100 MG 24 hr tablet Take 100 mg by mouth every evening.  . nitroGLYCERIN (NITROSTAT) 0.4 MG SL tablet Place 0.4 mg under the tongue See admin instructions. Every 5 minutes as needed for chest pain. Max of 3 doses  . SITagliptin Phosphate (JANUVIA PO) Take 50 mg by mouth daily.   Marland Kitchen tiotropium (SPIRIVA) 18 MCG inhalation capsule Place 1 capsule into inhaler and inhale at bedtime.   . [DISCONTINUED] colchicine 0.6 MG tablet Take 1 tablet (0.6 mg total) by mouth 2 (two) times daily as needed (pain).  . [DISCONTINUED] HYDROcodone-acetaminophen (NORCO) 5-325 MG tablet Take 1-2 tablets by mouth every 6 (six) hours as needed.  . [DISCONTINUED] indomethacin (INDOCIN) 25 MG capsule TAKE 1-2 CAPSULES (25-50 MG TOTAL) BY MOUTH 2 (TWO) TIMES DAILY AS NEEDED.  . [DISCONTINUED] naproxen (NAPROSYN) 375 MG tablet Take 1 tablet (375 mg total) by mouth 2 (two) times daily.  . [DISCONTINUED] oxyCODONE (OXY IR/ROXICODONE) 5 MG immediate release tablet 1-2 tab po q6h prn pain  . [DISCONTINUED] oxyCODONE-acetaminophen (PERCOCET/ROXICET) 5-325 MG tablet Take 1 tablet by mouth 2 (two) times daily as needed for severe pain.  . [DISCONTINUED] predniSONE (DELTASONE) 20  MG tablet Two daily with food     Allergies: Allergies as of 03/11/2019  . (No Known Allergies)   Past Medical History:  Diagnosis Date  . 5 years ago   . AICD (automatic cardioverter/defibrillator) present   . CHF (congestive heart failure) ([place])   . Coronary artery disease   . Diabetes mellitus without complication ([place])   . Dysrhythmia   . Hypertension   . Peripheral vascular disease ([place])   . Peripheral vascular disease ([place])   . Sleep apnea     Family History:  Family History  Problem Relation Age of Onset  . Heart disease Mother   . Diabetes Mother   . Peripheral vascular disease Mother   Reviewed on admission  Social  History:  Social History   Tobacco Use  . Smoking status: Current Every Day Smoker    Packs/day: 1.00    Years: 40.00    Pack years: 40.00  . Smokeless tobacco: Never Used  Substance Use Topics  . Alcohol use: Not Currently  . Drug use: Never  Reviewed on admission  Review of Systems: A complete ROS was negative except as per HPI.  Physical Exam: Blood pressure (!) 147/77, pulse 82, temperature 98.2 F (36.8 C), temperature source Oral, resp. rate 16, SpO2 98 %. Physical Exam Constitutional:      General: He is not in acute distress.    Appearance: Normal appearance.  HENT:     Head: Normocephalic and atraumatic.  Cardiovascular:     Rate and Rhythm: Normal rate and regular rhythm.     Pulses: Normal pulses.     Heart sounds: Normal heart sounds.  Pulmonary:     Effort: Pulmonary effort is normal. No respiratory distress.     Breath sounds: Normal breath sounds.  Abdominal:     General: Bowel sounds are normal. There is no distension.     Palpations: Abdomen is soft.     Tenderness: There is no abdominal tenderness.  Musculoskeletal:        General: Swelling and tenderness present. No deformity.     Comments: Swelling and drainage noted at L 5th digit  Skin:    General: Skin is warm and dry.  Neurological:     General: No focal deficit present.     Mental Status: Mental status is at baseline.     DG Toe 5th Left: IMPRESSION: Osteomyelitis appearance of the proximal phalanx head and middle Phalanx.  Assessment & Plan by Problem: Active Problems:   Osteomyelitis ([place])  Diabetic Foot Ulcer Osteomyelitis: Patient presents with continued prurient drainage despite PO antibiotics and new changes of osteomyelitis on Xray. Plan is for surgical intervention tomorrow by orthopedics who were consulted in the ED. - Appreciate Orthopedics Recommendations - Revised Cardiac Risk Index score of 2 for Hx of ischemic heart disease and Hx of Heart Failure. This corresponds to a  10.1% 30-day risk of cardiac event - Note his   Diabetes: Takes Metformin, Glipizide, Januvia, and Jardiance at home - Hold home meds - SSI w/ qHs  CHF:  EF 35% 2 years ago at [facility], now s/p AICD placement. Does not require Diuretics. No orthopnea nor edema. - Continue Metoprolol - Spironolactone Rx confirmed by pharmacy, will resume  HLD, PVD, CAD: No history of MI nor Stent Placement - Continue Atorvastatin and Metoprolol - Continue PRN nitrates - Zetia, ASA, Plavix Rx confirmed by Pharmacy, will resume ASA and Plavix after surgery and restart Zetia now  HTN: Continue Metoprolol and  Lisinopril  OSA: On CPAP, will continue this while here.  FEN: HH/CM, NPO @ [facility] VTE NGF:REVQ Code Status: FULL  Dispo: Admit patient to Inpatient with expected length of stay greater than 2 midnights.  Signed: Neva Seat, MD 03/11/2019, 12:45 PM  Pager: [phone]

## 2019-03-11 NOTE — Consult Note (Signed)
Reason for Consult:Right 5th toe osteo Referring Physician: Robyn Haber  Jon Jones is an 63 y.o. male.  HPI: Jon Jones comes in with about a 3 week hx/o left 5th toe swelling and discharge. He feels it started after he bought some new shoes that were too tight. He had a callus on that toe but thinks the shoes created a wound. His wife has been treating it at home but it wouldn't get any better. He denies fevers, chills, sweats, N/V, or prior hx/o similar. He has described some occasional sharp pains in the toe.  Past Medical History:  Diagnosis Date  . 5 years ago   . AICD (automatic cardioverter/defibrillator) present   . CHF (congestive heart failure) ([place])   . Coronary artery disease   . Diabetes mellitus without complication ([place])   . Dysrhythmia   . Hypertension   . Peripheral vascular disease ([place])   . Peripheral vascular disease ([place])   . Sleep apnea     Past Surgical History:  Procedure Laterality Date  . CARDIAC DEFIBRILLATOR PLACEMENT    . I & D EXTREMITY Left 07/19/2016   Procedure: IRRIGATION AND DEBRIDEMENT EXTREMITY/OLECRANON(WASHOUT);  Surgeon: Leandrew Koyanagi, MD;  Location: [place];  Service: Orthopedics;  Laterality: Left;  . OLECRANON BURSECTOMY Left 07/19/2016   Procedure: LEFT ELBOW OLECRANON BURSECTOMY;  Surgeon: Leandrew Koyanagi, MD;  Location: [place];  Service: Orthopedics;  Laterality: Left;    Family History  Problem Relation Age of Onset  . Heart disease Mother   . Diabetes Mother   . Peripheral vascular disease Mother     Social History:  reports that he has been smoking. He has a 40.00 pack-year smoking history. He has never used smokeless tobacco. He reports previous alcohol use. He reports that he does not use drugs.  Allergies: No Known Allergies  Medications: I have reviewed the patient's current medications.  Results for orders placed or performed during the hospital encounter of 03/11/19 (from the past 48 hour(s))  CBC with  Differential     Status: None   Collection Time: 03/11/19  7:40 AM  Result Value Ref Range   WBC 9.1 4.0 - 10.5 K/uL   RBC 4.71 4.22 - 5.81 MIL/uL   Hemoglobin 14.4 13.0 - 17.0 g/dL   HCT 46.4 39.0 - 52.0 %   MCV 98.5 80.0 - 100.0 fL   MCH 30.6 26.0 - 34.0 pg   MCHC 31.0 30.0 - 36.0 g/dL   RDW 13.5 11.5 - 15.5 %   Platelets 206 150 - 400 K/uL   nRBC 0.0 0.0 - 0.2 %   Neutrophils Relative % 59 %   Neutro Abs 5.5 1.7 - 7.7 K/uL   Lymphocytes Relative 30 %   Lymphs Abs 2.7 0.7 - 4.0 K/uL   Monocytes Relative 8 %   Monocytes Absolute 0.7 0.1 - 1.0 K/uL   Eosinophils Relative 1 %   Eosinophils Absolute 0.1 0.0 - 0.5 K/uL   Basophils Relative 1 %   Basophils Absolute 0.1 0.0 - 0.1 K/uL   Immature Granulocytes 1 %   Abs Immature Granulocytes 0.07 0.00 - 0.07 K/uL    Comment: Performed at [facility], [address] [place], [place] [address]    DG Toe 5th Left  Result Date: 03/11/2019 CLINICAL DATA:  Open sore to the little toe for 3 weeks.  Diabetes. EXAM: DG TOE 5TH LEFT COMPARISON:  02/22/2019 FINDINGS: New bony destruction to the middle phalanx and proximal  phalanx head of the fifth digit with swollen soft tissues. No opaque foreign body. IMPRESSION: Osteomyelitis appearance of the proximal phalanx head and middle phalanx. Electronically Signed   By: Marnee Spring M.D.   On: 03/11/2019 08:39    Review of Systems  Constitutional: Negative for chills, diaphoresis and fever.  HENT: Negative for ear discharge, ear pain, hearing loss and tinnitus.   Eyes: Negative for photophobia and pain.  Respiratory: Negative for cough and shortness of breath.   Cardiovascular: Negative for chest pain.  Gastrointestinal: Negative for abdominal pain, nausea and vomiting.  Genitourinary: Negative for dysuria, flank pain, frequency and urgency.  Musculoskeletal: Positive for arthralgias (Left 5th toe). Negative for back pain, myalgias and neck pain.  Neurological: Negative for dizziness  and headaches.  Hematological: Does not bruise/bleed easily.  Psychiatric/Behavioral: The patient is not nervous/anxious.    Blood pressure (!) 147/77, pulse 82, temperature 98.2 F (36.8 C), temperature source Oral, resp. rate 16, SpO2 98 %. Physical Exam  Constitutional: He appears well-developed and well-nourished. No distress.  HENT:  Head: Normocephalic and atraumatic.  Eyes: Conjunctivae are normal. Right eye exhibits no discharge. Left eye exhibits no discharge. No scleral icterus.  Cardiovascular: Normal rate and regular rhythm.  Respiratory: Effort normal. No respiratory distress.  Musculoskeletal:     Cervical back: Normal range of motion.     Comments: LLE No traumatic wounds, ecchymosis, or rash  5th toe fusiform edema, punctate wound laterally with purulent discharge  No knee or ankle effusion  Knee stable to varus/ valgus and anterior/posterior stress  Sens DPN, SPN, TN intact  Motor EHL, ext, flex, evers 5/5  DP 2+, PT 2+, No significant edema  Neurological: He is alert.  Skin: Skin is warm and dry. He is not diaphoretic.  Psychiatric: He has a normal mood and affect. His behavior is normal.    Assessment/Plan: Left 5th toe osteo -- Will likely need amputation tomorrow with Dr. Lajoyce Corners. Please keep NPO after MN. Multiple medical problems including OSA, CAD w/AICD, CHF, HTN, and DM -- per internal medicine who will admit, manage, and clear. Appreciate their help.    Freeman Caldron, PA-C Orthopedic Surgery [phone] 03/11/2019, 9:20 AM

## 2019-03-11 NOTE — ED Notes (Signed)
Pt given diet sprite 

## 2019-03-12 ENCOUNTER — Inpatient Hospital Stay (HOSPITAL_COMMUNITY): Payer: Medicaid Other | Admitting: Certified Registered"

## 2019-03-12 ENCOUNTER — Other Ambulatory Visit: Payer: Self-pay | Admitting: Physician Assistant

## 2019-03-12 ENCOUNTER — Encounter (HOSPITAL_COMMUNITY): Payer: Self-pay | Admitting: Internal Medicine

## 2019-03-12 ENCOUNTER — Encounter (HOSPITAL_COMMUNITY)
Admission: EM | Disposition: A | Discharge status: Home / Self Care | Payer: Self-pay | Source: Home / Self Care | Attending: Internal Medicine

## 2019-03-12 DIAGNOSIS — Z9989 Dependence on other enabling machines and devices: Secondary | ICD-10-CM

## 2019-03-12 HISTORY — PX: AMPUTATION: SHX166

## 2019-03-12 LAB — CBC
HCT: 44 % (ref 39.0–52.0)
Hemoglobin: 14.2 g/dL (ref 13.0–17.0)
MCH: 31 pg (ref 26.0–34.0)
MCHC: 32.3 g/dL (ref 30.0–36.0)
MCV: 96.1 fL (ref 80.0–100.0)
Platelets: 198 10*3/uL (ref 150–400)
RBC: 4.58 MIL/uL (ref 4.22–5.81)
RDW: 13.6 % (ref 11.5–15.5)
WBC: 9.2 10*3/uL (ref 4.0–10.5)
nRBC: 0 % (ref 0.0–0.2)

## 2019-03-12 LAB — BASIC METABOLIC PANEL
Anion gap: 14 (ref 5–15)
BUN: 19 mg/dL (ref 8–23)
CO2: 18 mmol/L — ABNORMAL LOW (ref 22–32)
Calcium: 9.2 mg/dL (ref 8.9–10.3)
Chloride: 105 mmol/L (ref 98–111)
Creatinine, Ser: 0.99 mg/dL (ref 0.61–1.24)
GFR calc Af Amer: >60 mL/min (ref >60)
GFR calc non Af Amer: >60 mL/min (ref >60)
Glucose, Bld: 102 mg/dL — ABNORMAL HIGH (ref 70–99)
Potassium: 4.4 mmol/L (ref 3.5–5.1)
Sodium: 137 mmol/L (ref 135–145)

## 2019-03-12 LAB — GLUCOSE, CAPILLARY
Glucose-Capillary: 139 mg/dL — ABNORMAL HIGH (ref 70–99)
Glucose-Capillary: 293 mg/dL — ABNORMAL HIGH (ref 70–99)
Glucose-Capillary: 127 mg/dL — ABNORMAL HIGH (ref 70–99)
Glucose-Capillary: 117 mg/dL — ABNORMAL HIGH (ref 70–99)
Glucose-Capillary: 232 mg/dL — ABNORMAL HIGH (ref 70–99)

## 2019-03-12 LAB — SURGICAL PCR SCREEN
MRSA, PCR: NEGATIVE
Staphylococcus aureus: NEGATIVE

## 2019-03-12 SURGERY — AMPUTATION DIGIT
Anesthesia: Monitor Anesthesia Care | Laterality: Left

## 2019-03-12 MED ORDER — CHLORHEXIDINE GLUCONATE 4 % EX LIQD: 60.0000 mL | Freq: Once | CUTANEOUS | Status: DC

## 2019-03-12 MED ORDER — FENTANYL CITRATE (PF) 250 MCG/5ML IJ SOLN
INTRAMUSCULAR | Status: AC
  Filled 2019-03-12: qty 5

## 2019-03-12 MED ORDER — METOCLOPRAMIDE HCL 5 MG/ML IJ SOLN: 5.0000 mg | Freq: Three times a day (TID) | INTRAMUSCULAR | Status: DC | PRN

## 2019-03-12 MED ORDER — HYDROMORPHONE HCL 1 MG/ML IJ SOLN: 0.5000 mg | INTRAMUSCULAR | Status: DC | PRN

## 2019-03-12 MED ORDER — CEFAZOLIN SODIUM-DEXTROSE 2-4 GM/100ML-% IV SOLN: 2.0000 g | INTRAVENOUS | Status: DC

## 2019-03-12 MED ORDER — LACTATED RINGERS IV SOLN: INTRAVENOUS | Status: DC

## 2019-03-12 MED ORDER — BUPIVACAINE HCL (PF) 0.25 % IJ SOLN
INTRAMUSCULAR | Status: AC
  Filled 2019-03-12: qty 30

## 2019-03-12 MED ORDER — OXYCODONE HCL 5 MG PO TABS
5.0000 mg | ORAL_TABLET | ORAL | Status: DC | PRN
  Administered 2019-03-13: 10 mg via ORAL
  Filled 2019-03-12: qty 2

## 2019-03-12 MED ORDER — MIDAZOLAM HCL 2 MG/2ML IJ SOLN
INTRAMUSCULAR | Status: AC
  Filled 2019-03-12: qty 2

## 2019-03-12 MED ORDER — ONDANSETRON HCL 4 MG PO TABS: 4.0000 mg | ORAL_TABLET | Freq: Four times a day (QID) | ORAL | Status: DC | PRN

## 2019-03-12 MED ORDER — MIDAZOLAM HCL 2 MG/2ML IJ SOLN
INTRAMUSCULAR | Status: DC | PRN
  Administered 2019-03-12: 1 mg via INTRAVENOUS

## 2019-03-12 MED ORDER — 0.9 % SODIUM CHLORIDE (POUR BTL) OPTIME
TOPICAL | Status: DC | PRN
  Administered 2019-03-12: 1000 mL

## 2019-03-12 MED ORDER — DOCUSATE SODIUM 100 MG PO CAPS
100.0000 mg | ORAL_CAPSULE | Freq: Two times a day (BID) | ORAL | Status: DC
  Administered 2019-03-12 – 2019-03-13 (×2): 100 mg via ORAL
  Filled 2019-03-12 (×2): qty 1

## 2019-03-12 MED ORDER — FENTANYL CITRATE (PF) 250 MCG/5ML IJ SOLN
INTRAMUSCULAR | Status: DC | PRN
  Administered 2019-03-12: 50 ug via INTRAVENOUS

## 2019-03-12 MED ORDER — METOCLOPRAMIDE HCL 5 MG PO TABS: 5.0000 mg | ORAL_TABLET | Freq: Three times a day (TID) | ORAL | Status: DC | PRN

## 2019-03-12 MED ORDER — ONDANSETRON HCL 4 MG/2ML IJ SOLN: 4.0000 mg | Freq: Four times a day (QID) | INTRAMUSCULAR | Status: DC | PRN

## 2019-03-12 MED ORDER — CEFAZOLIN SODIUM-DEXTROSE 2-4 GM/100ML-% IV SOLN
2.0000 g | Freq: Four times a day (QID) | INTRAVENOUS | Status: AC
  Administered 2019-03-12 – 2019-03-13 (×3): 2 g via INTRAVENOUS
  Filled 2019-03-12 (×3): qty 100

## 2019-03-12 MED ORDER — BUPIVACAINE HCL 0.25 % IJ SOLN
INTRAMUSCULAR | Status: DC | PRN
  Administered 2019-03-12: 20 mL

## 2019-03-12 SURGICAL SUPPLY — 23 items
BLADE SURG 21 STRL SS (BLADE) ×3 IMPLANT
BNDG COHESIVE 4X5 TAN STRL (GAUZE/BANDAGES/DRESSINGS) ×3 IMPLANT
BNDG ESMARK 4X9 LF (GAUZE/BANDAGES/DRESSINGS) ×3 IMPLANT
BNDG GAUZE ELAST 4 BULKY (GAUZE/BANDAGES/DRESSINGS) ×3 IMPLANT
COVER SURGICAL LIGHT HANDLE (MISCELLANEOUS) ×3 IMPLANT
DRAPE U-SHAPE 47X51 STRL (DRAPES) ×3 IMPLANT
DRSG ADAPTIC 3X8 NADH LF (GAUZE/BANDAGES/DRESSINGS) ×3 IMPLANT
DRSG PAD ABDOMINAL 8X10 ST (GAUZE/BANDAGES/DRESSINGS) ×3 IMPLANT
DURAPREP 26ML APPLICATOR (WOUND CARE) ×3 IMPLANT
ELECT REM PT RETURN 9FT ADLT (ELECTROSURGICAL) ×3
ELECTRODE REM PT RTRN 9FT ADLT (ELECTROSURGICAL) ×1 IMPLANT
GAUZE SPONGE 4X4 12PLY STRL (GAUZE/BANDAGES/DRESSINGS) ×3 IMPLANT
GLOVE BIOGEL PI IND STRL 9 (GLOVE) ×1 IMPLANT
GLOVE BIOGEL PI INDICATOR 9 (GLOVE) ×2
GLOVE SURG ORTHO 9.0 STRL STRW (GLOVE) ×3 IMPLANT
GOWN STRL REUS W/ TWL XL LVL3 (GOWN DISPOSABLE) ×2 IMPLANT
GOWN STRL REUS W/TWL XL LVL3 (GOWN DISPOSABLE) ×4
KIT BASIN OR (CUSTOM PROCEDURE TRAY) ×3 IMPLANT
KIT TURNOVER KIT B (KITS) ×3 IMPLANT
NS IRRIG 1000ML POUR BTL (IV SOLUTION) ×3 IMPLANT
PACK ORTHO EXTREMITY (CUSTOM PROCEDURE TRAY) ×3 IMPLANT
SUT ETHILON 2 0 PSLX (SUTURE) ×3 IMPLANT
TOWEL GREEN STERILE (TOWEL DISPOSABLE) ×3 IMPLANT

## 2019-03-12 NOTE — TOC Initial Note (Signed)
Transition of Care [facility]) - Initial/Assessment Note    Patient Details  Name: Jon Jones MRN: [id number] Date of Birth: 07-Oct-1955  [facility]) CM/SW Contact:    Marilu Favre, RN Phone Number: 03/12/2019, 11:40 AM  Clinical Narrative:                 Confirmed face sheet information.   PCP is a MD in [place] , patient unsure of MD's name but will find information and provide nurse with name.   Patient from home with wife and son.   Patient asking for crutches and HHRN for dressing changes.   Explained post op PT will work with him for recommendations regarding home health/ DME. Also, Dr Sharol Given will provide wound care instructions post op.   Patient voiced understanding. Will continue to follow.   Expected Discharge Plan: Home/Self Care     Patient Goals and CMS Choice Patient states their goals for this hospitalization and ongoing recovery are:: to go home CMS Medicare.gov Compare Post Acute Care list provided to:: Patient Choice offered to / list presented to : NA  Expected Discharge Plan and Services Expected Discharge Plan: Home/Self Care   Discharge Planning Services: CM Consult   Living arrangements for the past 2 months: Single Family Home                                      Prior Living Arrangements/Services Living arrangements for the past 2 months: Single Family Home Lives with:: Adult Children, Spouse Patient language and need for interpreter reviewed:: Yes        Need for Family Participation in Patient Care: Yes (Comment) Care giver support system in place?: Yes (comment)   Criminal Activity/Legal Involvement Pertinent to Current Situation/Hospitalization: No - Comment as needed  Activities of Daily Living      Permission Sought/Granted   Permission granted to share information with : No              Emotional Assessment Appearance:: Appears stated age Attitude/Demeanor/Rapport: Engaged Affect (typically  observed): Accepting Orientation: : Oriented to Self, Oriented to Place, Oriented to  Time, Oriented to Situation Alcohol / Substance Use: Not Applicable Psych Involvement: No (comment)  Admission diagnosis:  Osteomyelitis ([name]) [M86.9] Type 2 diabetes mellitus with foot ulcer, without long-term current use of insulin ([place]) [F81.017, L97.509] Osteomyelitis of fifth toe of left foot ([place]) [M86.9] Patient Active Problem List   Diagnosis Date Noted  . Osteomyelitis ([place]) 03/11/2019  . Osteomyelitis of fifth toe of left foot ([place])   . Type 2 diabetes mellitus with foot ulcer, without long-term current use of insulin ([place])   . 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 S/P debridement 07/19/2016  . Acute gout of left elbow 07/19/2016   PCP:  System, Provider Not In Pharmacy:   [id number] - [place], [place]. [place] [address] Phone: [phone] Fax: [phone]     Social Determinants of Health (SDOH) Interventions    Readmission Risk Interventions No flowsheet data found.

## 2019-03-12 NOTE — Progress Notes (Signed)
   Subjective:  Patient evaluated at bedside this morning. He denies any medical complaints at this time. Patient is understanding of the plan at this time.   Objective:  Vital signs in last 24 hours: Vitals:   03/11/19 1745 03/11/19 2048 03/11/19 2351 03/12/19 0603  BP: 121/68 119/72 119/72 108/61  Pulse: 71 79 79 75  Resp: 18 18 18    Temp: 98.2 F (36.8 C) 98.6 F (37 C)  98.3 F (36.8 C)  TempSrc: Oral Oral  Oral  SpO2: 100% 95% 96% 98%   Physical Exam  Constitutional: He is oriented to person, place, and time and well-developed, well-nourished, and in no distress. No distress.  HENT:  Head: Normocephalic and atraumatic.  Eyes: EOM are normal.  Cardiovascular: Normal rate and regular rhythm.  Pulmonary/Chest: Effort normal and breath sounds normal. No respiratory distress. He has no wheezes.  Abdominal: Soft. There is no abdominal tenderness.  Musculoskeletal:        General: Normal range of motion.     Cervical back: Normal range of motion and neck supple.     Comments: Swelling and drainage noted at L 5th digit; no tenderness or erythema  Neurological: He is alert and oriented to person, place, and time.  Skin: Skin is warm.  Nursing note and vitals reviewed.  Assessment & Plan by Problem: Active Problems:   Osteomyelitis ([place])  Diabetic Foot Ulcer Osteomyelitis: Patient presented with continued prurient drainage despite PO antibiotics and new changes of osteomyelitis on Xray. Plan is for surgical intervention this evening by orthopedics. - Appreciate Orthopedics Recommendations - Anticipate discharge home once patient can safely ambulate with partial weightbearing on left foot -PT/OT ordred  Diabetes: Takes Metformin, Glipizide, Januvia, and Jardiance at home - Hold home meds - SSI w/ qHs  CHF:  EF 35% 2 years ago at [facility], now s/p AICD placement. Does not require Diuretics. No orthopnea nor edema. - Continue Metoprolol - Spironolactone   HLD, PVD, CAD: No  history of MI nor Stent Placement - Continue Atorvastatin and Metoprolol - Continue PRN nitrates - Zetia, ASA, Plavix Rx confirmed by Pharmacy, will resume ASA and Plavix after surgery   HTN: Continue Metoprolol and Lisinopril  OSA: On CPAP, will continue this while here.  FEN: HH/CM, NPO after 10 AM VTE XTK:WIOX Code Status: FULL   LOS: 1 day   Benedetto Goad, Medical Student 03/12/2019, 11:33 AM   Attestation for Student Documentation:  I personally was present and performed or re-performed the history, physical exam and medical decision-making activities of this service and have verified that the service and findings are accurately documented in the student's note.  Earlene Plater, MD Internal Medicine, PGY1 Pager: [phone]  03/12/2019,12:33 PM

## 2019-03-12 NOTE — Progress Notes (Signed)
Orthopedic Tech Progress Note Patient Details:  Jon Jones 1955/05/01 [id number]  Ortho Devices Type of Ortho Device: Postop shoe/boot Ortho Device/Splint Location: LLE Ortho Device/Splint Interventions: Application, Ordered       Janit Pagan 03/12/2019, 5:18 PM

## 2019-03-12 NOTE — Progress Notes (Signed)
Pre-surgery G2 drink given to patient 3 hours prior surgery.

## 2019-03-12 NOTE — Progress Notes (Signed)
Placed patient on CPAP tolerating well. Nos issues to report.

## 2019-03-12 NOTE — Transfer of Care (Signed)
Immediate Anesthesia Transfer of Care Note  [name]  Procedure(s) Performed: 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 (Left )  Patient Location: PACU  Anesthesia Type:MAC  Level of Consciousness: awake, alert  and oriented  Airway & Oxygen Therapy: Patient Spontanous Breathing  Post-op Assessment: Report given to RN  Post vital signs: Reviewed and stable  Last Vitals:  Vitals Value Taken Time  BP    Temp    Pulse 78 03/12/19 1610  Resp 5 03/12/19 1610  SpO2 95 % 03/12/19 1610  Vitals shown include unvalidated device data.  Last Pain:  Vitals:   03/12/19 0803  TempSrc:   PainSc: 0-No pain         Complications: No apparent anesthesia complications

## 2019-03-12 NOTE — Anesthesia Procedure Notes (Signed)
Procedure Name: MAC Date/Time: 03/12/2019 3:38 PM Performed by: Barrington Ellison, CRNA Pre-anesthesia Checklist: Patient identified, Emergency Drugs available, Suction available, Patient being monitored and Timeout performed Patient Re-evaluated:Patient Re-evaluated prior to induction Oxygen Delivery Method: Nasal cannula

## 2019-03-12 NOTE — Op Note (Signed)
03/12/2019  6:53 PM  PATIENT:  Jon Jones    PRE-OPERATIVE DIAGNOSIS:  Osteomyelitis Left 5th Toe  POST-OPERATIVE DIAGNOSIS:  Same  PROCEDURE:  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  SURGEON:  [name], MD  PHYSICIAN ASSISTANT:None ANESTHESIA:   General  PREOPERATIVE INDICATIONS:  Jon Jones is a  63 y.o. male with a diagnosis of Osteomyelitis Left 5th Toe who failed conservative measures and elected for surgical management.    The risks benefits and alternatives were discussed with the patient preoperatively including but not limited to the risks of infection, bleeding, nerve injury, cardiopulmonary complications, the need for revision surgery, among others, and the patient was willing to proceed.  OPERATIVE IMPLANTS: none  @ENCIMAGES @  OPERATIVE FINDINGS: Minimal petechial bleeding no deep abscess  OPERATIVE PROCEDURE: Patient brought the operating room and underwent a regional anesthetic.  After adequate levels anesthesia were obtained patient's left lower extremity was prepped using DuraPrep draped into a sterile field a timeout was called.  A racquet incision was made around the toe just distal to the MTP joint.  The toe was amputated through the MTP joint there is no signs of abscess or infection at the amputation level.  The wound was irrigated with normal saline there was minimal petechial bleeding the skin was closed using 2-0 nylon and a sterile dressing was applied.   DISCHARGE PLANNING:  Antibiotic duration: Continue antibiotics for 24 hours  Weightbearing: Touchdown weightbearing on the left foot  Pain medication: Opioid pathway  Dressing care/ Wound VAC: Dry dressing  Ambulatory devices: Walker  Discharge to: Anticipate discharge to home  Follow-up: In the office 1 week post operative.

## 2019-03-12 NOTE — Anesthesia Postprocedure Evaluation (Signed)
Anesthesia Post Note  [name]  Procedure(s) Performed: 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 (Left )     Patient location during evaluation: PACU Anesthesia Type: MAC Level of consciousness: awake and alert, patient cooperative and oriented Pain management: pain level controlled Vital Signs Assessment: post-procedure vital signs reviewed and stable Respiratory status: spontaneous breathing, nonlabored ventilation and respiratory function stable Cardiovascular status: blood pressure returned to baseline and stable Postop Assessment: no apparent nausea or vomiting Anesthetic complications: no    Last Vitals:  Vitals:   03/12/19 1621 03/12/19 1636  BP: 105/66 97/84  Pulse: 74 77  Resp: 14 16  Temp:  (!) 36.3 C  SpO2: 96% 97%    Last Pain:  Vitals:   03/12/19 1636  TempSrc: Oral  PainSc:                  Tameshia Bonneville,E. Winnie Umali

## 2019-03-12 NOTE — Anesthesia Preprocedure Evaluation (Signed)
Anesthesia Evaluation  Patient identified by MRN, date of birth, ID band Patient awake    Reviewed: Allergy & Precautions, NPO status , Patient's Chart, lab work & pertinent test results  History of Anesthesia Complications Negative for: history of anesthetic complications  Airway Mallampati: I  TM Distance: >3 FB Neck ROM: Full    Dental  (+) Edentulous Upper, Edentulous Lower   Pulmonary sleep apnea (does not use CPAP) , Current Smoker and Patient abstained from smoking.,    breath sounds clear to auscultation       Cardiovascular hypertension, Pt. on medications and Pt. on home beta blockers (-) angina+ CAD, + Past MI and + Peripheral Vascular Disease  + Cardiac Defibrillator  Rhythm:Regular Rate:Normal  '18 ECHO: EF 35%, MODERATE LV DYSFUNCTION NORMAL LA PRESSURES WITH NORMAL DIASTOLIC FUNCTION NORMAL RIGHT VENTRICULAR SYSTOLIC FUNCTION VALVULAR REGURGITATION: TRIVIAL MR, TRIVIAL PR, TRIVIAL TR NO VALVULAR STENOSIS   Neuro/Psych negative neurological ROS     GI/Hepatic negative GI ROS, Neg liver ROS,   Endo/Other  diabetes (glu 293), Oral Hypoglycemic Agents  Renal/GU negative Renal ROS     Musculoskeletal  (+) Arthritis ,   Abdominal (+) + obese,   Peds  Hematology negative hematology ROS (+)   Anesthesia Other Findings   Reproductive/Obstetrics                             Anesthesia Physical Anesthesia Plan  ASA: III  Anesthesia Plan: MAC   Post-op Pain Management:    Induction:   PONV Risk Score and Plan: 0  Airway Management Planned: Natural Airway and Nasal Cannula  Additional Equipment:   Intra-op Plan:   Post-operative Plan:   Informed Consent: I have reviewed the patients History and Physical, chart, labs and discussed the procedure including the risks, benefits and alternatives for the proposed anesthesia with the patient or authorized representative  who has indicated his/her understanding and acceptance.       Plan Discussed with: CRNA and Surgeon  Anesthesia Plan Comments: (Plan routine monitors, local with mild sedation)        Anesthesia Quick Evaluation

## 2019-03-12 NOTE — Interval H&P Note (Signed)
History and Physical Interval Note:  03/12/2019 6:45 AM  Jon Jones  has presented today for surgery, with the diagnosis of Osteomyelitis Left 5th Toe.  The various methods of treatment have been discussed with the patient and family. After consideration of risks, benefits and other options for treatment, the patient has consented to  Procedure(s): 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 (Left) as a surgical intervention.  The patient's history has been reviewed, patient examined, no change in status, stable for surgery.  I have reviewed the patient's chart and labs.  Questions were answered to the patient's satisfaction.     [name]

## 2019-03-12 NOTE — Plan of Care (Signed)
  Problem: Clinical Measurements: Goal: Ability to maintain clinical measurements within normal limits will improve Outcome: Progressing Goal: Will remain free from infection Outcome: Progressing   

## 2019-03-13 DIAGNOSIS — Z89422 Acquired absence of other left toe(s): Secondary | ICD-10-CM

## 2019-03-13 LAB — CBC
HCT: 42.4 % (ref 39.0–52.0)
Hemoglobin: 13.7 g/dL (ref 13.0–17.0)
MCH: 30.9 pg (ref 26.0–34.0)
MCHC: 32.3 g/dL (ref 30.0–36.0)
MCV: 95.7 fL (ref 80.0–100.0)
Platelets: 197 10*3/uL (ref 150–400)
RBC: 4.43 MIL/uL (ref 4.22–5.81)
RDW: 13.7 % (ref 11.5–15.5)
WBC: 9.5 10*3/uL (ref 4.0–10.5)
nRBC: 0 % (ref 0.0–0.2)

## 2019-03-13 LAB — BASIC METABOLIC PANEL
Anion gap: 11 (ref 5–15)
BUN: 19 mg/dL (ref 8–23)
CO2: 23 mmol/L (ref 22–32)
Calcium: 9 mg/dL (ref 8.9–10.3)
Chloride: 104 mmol/L (ref 98–111)
Creatinine, Ser: 1.01 mg/dL (ref 0.61–1.24)
GFR calc Af Amer: >60 mL/min (ref >60)
GFR calc non Af Amer: >60 mL/min (ref >60)
Glucose, Bld: 119 mg/dL — ABNORMAL HIGH (ref 70–99)
Potassium: 4.2 mmol/L (ref 3.5–5.1)
Sodium: 138 mmol/L (ref 135–145)

## 2019-03-13 LAB — GLUCOSE, CAPILLARY
Glucose-Capillary: 129 mg/dL — ABNORMAL HIGH (ref 70–99)
Glucose-Capillary: 266 mg/dL — ABNORMAL HIGH (ref 70–99)

## 2019-03-13 NOTE — Progress Notes (Signed)
   Subjective:  Patient evaluated at bedside this morning. Patient tolerated procedure well yesterday. He denies any pain or medical complaints at this time. He has good urine output. Patient is prepared for discharge at this time.   Objective:  Vital signs in last 24 hours: Vitals:   03/12/19 2339 03/13/19 0122 03/13/19 0554 03/13/19 1005  BP: 113/63 107/66 104/71 103/65  Pulse: 76 63 (!) 59 76  Resp: 16 16  18   Temp:  97.8 F (36.6 C) (!) 97.4 F (36.3 C) 98.4 F (36.9 C)  TempSrc:  Oral Oral Oral  SpO2: 98% 98% 95% 97%  Weight:      Height:       Physical Exam General: Sitting up in bed comfortably HEENT: NCAT CV: RRR, normal S1-S2 no murmurs rubs or gallops appreciated PULM: Clear to auscultation bilaterally, no crackles or wheezes appreciated ABD: Soft and nontender MSK: Left fifth toe amputated wrapped in bandages. Neuro: Alert and oriented, no focal deficits  Assessment & Plan by Problem: Active Problems:   Osteomyelitis ([place])  In summary, Mr. Jon Jones is a 63 year old male with past medical history significant for T2DM, CHF, HLD, PVD, CAD, HTN and OSA who presented after failing conservative treatment of osteomyelitis of the left pinky toe.  He is postop day 1 of left pinky toe amputation and currently has no pain.  Patient is stable for discharge.  # Diabetic Foot Ulcer/Osteomyelitis: Patient initially presentedon 12/29 with continued prurulent drainage from left fifth toe despite PO antibiotics. Patient evaluated by orthopedics and underwent left fifth toe amputation on 12/30 which he tolerated well without any immediate complications. - Appreciate Orthopedics Recommendations, patient will follow up in the outpatient setting within a week.  # Diabetes: Takes Metformin, Glipizide, Januvia, and Jardiance at home - Hold home meds - SSI w/ qHs  # CHF:  EF 35% 2 years ago at [facility], now s/p AICD placement. Does not require Diuretics. No orthopnea nor edema. -  Continue Metoprolol - Spironolactone   # HLD, PVD, CAD: No history of MI nor Stent Placement - Continue Atorvastatin and Metoprolol - Continue PRN nitrates - Zetia, ASA, Plavix Rx confirmed by Pharmacy, will resume ASA and Plavix after surgery   # HTN: Continue Metoprolol and Lisinopril  # OSA: On CPAP, will continue this while here.  FEN: HH/CM, NPO after 10 AM VTE NOB:SJGG Code Status: FULL    LOS: 2 days   Benedetto Goad, Medical Student 03/13/2019, 10:41 AM   Attestation for Student Documentation:  I personally was present and performed or re-performed the history, physical exam and medical decision-making activities of this service and have verified that the service and findings are accurately documented in the student's note.  Earlene Plater, MD 03/13/2019, 3:29 PM  Earlene Plater, MD Internal Medicine, PGY1 Pager: [phone]  03/13/2019,3:29 PM

## 2019-03-13 NOTE — Evaluation (Signed)
Occupational Therapy Evaluation Patient Details Name: Jon Jones MRN: [id number] DOB: 04/29/1955 Today's Date: 03/13/2019    History of Present Illness Pt is a 63 y/o male s/p L 5th toe amputation. PMH including but not limited to CHF, CAD, AICD, DM, HTN and PVD.   Clinical Impression   Pt is at sup level for safety with mobility using RW and with LD ADLs. Pt able to don Darco shoe for L foot independently. Pt has DME delivered to his room (RW, [facility]). Pt will have assist as needed at home. All education completed and no further acute OT is indicated at this time. OT will sign off    Follow Up Recommendations  No OT follow up;Supervision - Intermittent    Equipment Recommendations  3 in 1 bedside commode    Recommendations for Other Services       Precautions / Restrictions Precautions Precautions: Fall Restrictions Weight Bearing Restrictions: Yes LLE Weight Bearing: Touchdown weight bearing      Mobility Bed Mobility Overal bed mobility: Independent             General bed mobility comments: pt sitting upright at EOB upon arrival  Transfers Overall transfer level: Needs assistance Equipment used: Rolling walker (2 wheeled) Transfers: Sit to/from Stand Sit to Stand: Supervision         General transfer comment: for safety    Balance Overall balance assessment: Needs assistance Sitting-balance support: No upper extremity supported Sitting balance-Leahy Scale: Normal     Standing balance support: During functional activity Standing balance-Leahy Scale: Fair                             ADL either performed or assessed with clinical judgement   ADL Overall ADL's : Needs assistance/impaired Eating/Feeding: Independent;Sitting   Grooming: Wash/dry hands;Wash/dry face;Standing;Supervision/safety   Upper Body Bathing: Set up;Independent;Sitting   Lower Body Bathing: Supervison/ safety;Sitting/lateral leans;Sit to/from stand   Upper Body  Dressing : Set up;Independent;Sitting   Lower Body Dressing: Supervision/safety;Sitting/lateral leans;Sit to/from stand   Toilet Transfer: Supervision/safety;Ambulation;RW;Comfort height toilet;Grab bars   Toileting- Clothing Manipulation and Hygiene: Supervision/safety;Sit to/from stand       Functional mobility during ADLs: Supervision/safety       Vision Baseline Vision/History: Wears glasses Wears Glasses: At all times Patient Visual Report: No change from baseline       Perception     Praxis      Pertinent Vitals/Pain Pain Assessment: No/denies pain     Hand Dominance Right   Extremity/Trunk Assessment Upper Extremity Assessment Upper Extremity Assessment: Overall WFL for tasks assessed   Lower Extremity Assessment Lower Extremity Assessment: Defer to PT evaluation   Cervical / Trunk Assessment Cervical / Trunk Assessment: Normal   Communication Communication Communication: No difficulties   Cognition Arousal/Alertness: Awake/alert Behavior During Therapy: WFL for tasks assessed/performed Overall Cognitive Status: Within Functional Limits for tasks assessed                                     General Comments       Exercises Exercises: General Lower Extremity General Exercises - Lower Extremity Ankle Circles/Pumps: AROM;Left;10 reps;Seated   Shoulder Instructions      Home Living Family/patient expects to be discharged to:: Private residence Living Arrangements: Children;Spouse/significant other Available Help at Discharge: Family Type of Home: House Home Access: Level entry  Home Layout: Able to live on main level with bedroom/bathroom     Bathroom Shower/Tub: Tub/shower unit;Walk-in shower   Bathroom Toilet: Standard     Home Equipment: None          Prior Functioning/Environment Level of Independence: Independent                 OT Problem List: Impaired balance (sitting and/or standing);Decreased  activity tolerance      OT Treatment/Interventions:      OT Goals(Current goals can be found in the care plan section) Acute Rehab OT Goals Patient Stated Goal: go home today OT Goal Formulation: With patient  OT Frequency:     Barriers to D/C:    no barriers       Co-evaluation              AM-PAC OT "6 Clicks" Daily Activity     Outcome Measure Help from another person eating meals?: None Help from another person taking care of personal grooming?: None Help from another person toileting, which includes using toliet, bedpan, or urinal?: None Help from another person bathing (including washing, rinsing, drying)?: None Help from another person to put on and taking off regular upper body clothing?: None Help from another person to put on and taking off regular lower body clothing?: A Little 6 Click Score: 23   End of Session Equipment Utilized During Treatment: Gait belt;Rolling walker  Activity Tolerance: Patient tolerated treatment well Patient left: in bed  OT Visit Diagnosis: Other abnormalities of gait and mobility (R26.89)                Time: 4235-3614 OT Time Calculation (min): 28 min Charges:  OT General Charges $OT Visit: 1 Visit OT Evaluation $OT Eval Low Complexity: 1 Low OT Treatments $Self Care/Home Management : 8-22 mins    Britt Bottom 03/13/2019, 1:52 PM

## 2019-03-13 NOTE — Progress Notes (Signed)
Discharge pt to home, instructions given and explain.

## 2019-03-13 NOTE — Progress Notes (Signed)
POD 1 Left 5th toe amputation. Dressing Clean dry and intact, pain control excellent. Patient asking for comode and walker for home as his bathroom is on 2nd floor  From an orthopedic standpoint patient could DC today. Follow up with Dr. Sharol Given 1 week. Should keep dressing dry and in place. Will order 3 in 1 and walker

## 2019-03-13 NOTE — Care Management (Signed)
Ordered 3 in 1 and walker with Zack with [employer] this morning

## 2019-03-13 NOTE — Evaluation (Signed)
Physical Therapy Evaluation & Discharge Patient Details Name: Jon Jones MRN: [id number] DOB: Jan 04, 1956 Today's Date: 03/13/2019   History of Present Illness  Pt is a 63 y/o male s/p L 5th toe amputation. PMH including but not limited to CHF, CAD, AICD, DM, HTN and PVD.  Clinical Impression  Pt presented sitting upright at EOB, awake and willing to participate in therapy session. Prior to admission, pt reported that he was independent with all functional mobility and ADLs. Pt lives with his spouse and son in a two level house with a level entry. Pt reported that prior to surgery, he moved his bed downstairs and is planning to stay on the main level upon d/c. At the time of evaluation, pt overall at a supervision level with mobility. He was able to maintain TDWB or NWB L LE throughout with cueing. Pt also appropriately using RW to ambulate a short distance within his room. Plan is for pt to d/c home today with family support. Pt is ready for d/c from PT perspective. No further acute PT needs identified at this time. PT signing off.     Follow Up Recommendations No PT follow up    Equipment Recommendations  Rolling walker with 5" wheels;3in1 (PT)    Recommendations for Other Services       Precautions / Restrictions Precautions Precautions: Fall Restrictions Weight Bearing Restrictions: Yes LLE Weight Bearing: Touchdown weight bearing      Mobility  Bed Mobility               General bed mobility comments: pt sitting upright at EOB upon arrival  Transfers Overall transfer level: Needs assistance Equipment used: Rolling walker (2 wheeled) Transfers: Sit to/from Stand Sit to Stand: Supervision         General transfer comment: for safety  Ambulation/Gait Ambulation/Gait assistance: Supervision Gait Distance (Feet): 20 Feet Assistive device: Rolling walker (2 wheeled) Gait Pattern/deviations: (hop-to on R LE) Gait velocity: decreased   General Gait Details: pt  with mild instability but no overt LOB or need for physical assistance; pt maintaining NWB L LE throughout  Stairs            Wheelchair Mobility    Modified Rankin (Stroke Patients Only)       Balance Overall balance assessment: Needs assistance Sitting-balance support: No upper extremity supported Sitting balance-Leahy Scale: Normal     Standing balance support: During functional activity Standing balance-Leahy Scale: Fair                               Pertinent Vitals/Pain Pain Assessment: No/denies pain    Home Living Family/patient expects to be discharged to:: Private residence Living Arrangements: Children;Spouse/significant other Available Help at Discharge: Family Type of Home: House Home Access: Level entry     Home Layout: Able to live on main level with bedroom/bathroom Home Equipment: None      Prior Function Level of Independence: Independent               Hand Dominance        Extremity/Trunk Assessment   Upper Extremity Assessment Upper Extremity Assessment: Overall WFL for tasks assessed    Lower Extremity Assessment Lower Extremity Assessment: Overall WFL for tasks assessed    Cervical / Trunk Assessment Cervical / Trunk Assessment: Normal  Communication   Communication: No difficulties  Cognition Arousal/Alertness: Awake/alert Behavior During Therapy: WFL for tasks assessed/performed Overall Cognitive Status: Within Functional  Limits for tasks assessed                                        General Comments      Exercises General Exercises - Lower Extremity Ankle Circles/Pumps: AROM;Left;10 reps;Seated   Assessment/Plan    PT Assessment Patent does not need any further PT services  PT Problem List         PT Treatment Interventions      PT Goals (Current goals can be found in the Care Plan section)  Acute Rehab PT Goals Patient Stated Goal: "home today" PT Goal Formulation: All  assessment and education complete, DC therapy    Frequency     Barriers to discharge        Co-evaluation               AM-PAC PT "6 Clicks" Mobility  Outcome Measure Help needed turning from your back to your side while in a flat bed without using bedrails?: None Help needed moving from lying on your back to sitting on the side of a flat bed without using bedrails?: None Help needed moving to and from a bed to a chair (including a wheelchair)?: None Help needed standing up from a chair using your arms (e.g., wheelchair or bedside chair)?: None Help needed to walk in hospital room?: None Help needed climbing 3-5 steps with a railing? : A Little 6 Click Score: 23    End of Session   Activity Tolerance: Patient tolerated treatment well Patient left: with call bell/phone within reach;Other (comment)(sitting EOB) Nurse Communication: Mobility status PT Visit Diagnosis: Other abnormalities of gait and mobility (R26.89)    Time: 7564-3329 PT Time Calculation (min) (ACUTE ONLY): 15 min   Charges:   PT Evaluation $PT Eval Low Complexity: 1 Low          Ginette Pitman, PT, DPT  Acute Rehabilitation Services Pager [phone] Office [phone]    Jon Jones 03/13/2019, 1:20 PM

## 2019-03-13 NOTE — Discharge Summary (Signed)
Name: Jon Jones MRN: [id number] DOB: [birth year] 63 y.o. PCP: System, Provider Not In  Date of Admission: 03/11/2019  7:32 AM Date of Discharge: 03/13/2019 Attending Physician: Inez Catalina, MD  Discharge Diagnosis:  1. Osteomyelitis s/p left fifth toe amputation  Discharge Medications: Allergies as of 03/13/2019   No Known Allergies     Medication List    TAKE these medications   aspirin 81 MG EC tablet Take 81 mg by mouth daily.   atorvastatin 80 MG tablet Commonly known as: LIPITOR Take 80 mg by mouth every evening.   buPROPion 150 MG 12 hr tablet Commonly known as: WELLBUTRIN SR Take 150 mg by mouth 2 (two) times daily at 8 am and 10 pm.   clopidogrel 75 MG tablet Commonly known as: PLAVIX Take 75 mg by mouth daily.   empagliflozin 25 MG Tabs tablet Commonly known as: JARDIANCE Take 25 mg by mouth at bedtime.   ezetimibe 10 MG tablet Commonly known as: ZETIA Take 10 mg by mouth at bedtime.   glipiZIDE 10 MG tablet Commonly known as: GLUCOTROL Take 10 mg by mouth 2 (two) times daily.   Januvia 50 MG tablet Generic drug: sitaGLIPtin Take 50 mg by mouth daily.   lisinopril 10 MG tablet Commonly known as: ZESTRIL Take 10 mg by mouth daily.   metFORMIN 1000 MG tablet Commonly known as: GLUCOPHAGE Take 1,000 mg by mouth 2 (two) times daily with a meal.   metoprolol succinate 100 MG 24 hr tablet Commonly known as: TOPROL-XL Take 100 mg by mouth daily.   nitroGLYCERIN 0.4 MG SL tablet Commonly known as: NITROSTAT Place 0.4 mg under the tongue See admin instructions. Every 5 minutes as needed for chest pain. Max of 3 doses   spironolactone 25 MG tablet Commonly known as: ALDACTONE Take 12.5 mg by mouth daily.            Durable Medical Equipment  (From admission, onward)         Start     Ordered   03/13/19 1118  DME 3-in-1  Once     03/13/19 1126   03/13/19 1118  DME Walker  Once    Question Answer Comment  Walker: With 5 Inch  Wheels   Patient needs a walker to treat with the following condition Amputation of fifth toe of left foot ([name])      03/13/19 1126   03/13/19 0747  For home use only DME Walker rolling  Once    Question:  Patient needs a walker to treat with the following condition  Answer:  Amputated toe of left foot ([name])   03/13/19 0747   03/13/19 0746  For home use only DME 3 n 1  Once     03/13/19 0745         Disposition and follow-up:   Jon Jones was discharged from [facility] in Good condition.  At the hospital follow up visit please address:  1.  Follow-up with Dr. Lajoyce Corners at [facility] in 1 week for post-operative wound evaluation.   Follow-up Appointments: Follow-up Information    [name], MD In 1 week.   Specialty: Orthopedic Surgery Contact information: [address] [place] [address] [phone]          Hospital Course by problem list:   1. Diabetic Foot Ulcer/Osteomyelitis: Patient initially presented on 12/29 with continued prurulent drainage from left fifth toe despite PO antibiotics. At that time there was no tenderness,  erythema, or fever. XR revealed new osteomyelitic changes of left fifth toe on admission; ortho evaluated and recommended surgical amputation of left fifth toe. Patient underwent procedure on 12/30 which he tolerated well without any immediate complications. Patient has remained hemodynamically stable and afebrile throughout admission.   Discharge Vitals:   BP 104/71 (BP Location: Left Arm)   Pulse (!) 59   Temp (!) 97.4 F (36.3 C) (Oral)   Resp 16   Ht 5\' 6"  (1.676 m)   Wt 106 kg   SpO2 95%   BMI 37.72 kg/m   Pertinent Studies and procedures:   DG Foot Complete Left  Result Date: 02/22/2019 CLINICAL DATA:  Left foot pain EXAM: LEFT FOOT - COMPLETE 3+ VIEW COMPARISON:  None. FINDINGS: No fracture or dislocation of the left foot. Joint spaces are generally well preserved. Diffuse soft tissue edema  about the midfoot and forefoot. IMPRESSION: No fracture or dislocation of the left foot. Diffuse soft tissue edema about the midfoot and forefoot. Electronically Signed   By: Lauralyn PrimesAlex  Bibbey M.D.   On: 02/22/2019 14:53   DG Toe 5th Left  Result Date: 03/11/2019 CLINICAL DATA:  Open sore to the little toe for 3 weeks.  Diabetes. EXAM: DG TOE 5TH LEFT COMPARISON:  02/22/2019 FINDINGS: New bony destruction to the middle phalanx and proximal phalanx head of the fifth digit with swollen soft tissues. No opaque foreign body. IMPRESSION: Osteomyelitis appearance of the proximal phalanx head and middle phalanx. Electronically Signed   By: Marnee SpringJonathon  Watts M.D.   On: 03/11/2019 08:39   Amputation of left 5th toe:  Patient brought the operating room and underwent a regional anesthetic. After adequate levels anesthesia were obtained patient's left lower extremity was prepped using DuraPrep draped into a sterile field a timeout was called.  A racquet incision was made around the toe just distal to the MTP joint.  The toe was amputated through the MTP joint there is no signs of abscess or infection at the amputation level.  The wound was irrigated with normal saline there was minimal petechial bleeding the skin was closed using 2-0 nylon and a sterile dressing was applied.   Discharge Instructions:   You presented to the Emergency Department on 12/29 for evaluation of persistent drainage from a diabetic toe ulcer despite appropriate antibiotic therapy. You were ultimately found to have an infection of the bone (osteomyelitis) of your left small toe. Orthopedic surgery evaluated you and ultimately decided to amputate this toe on 12/30. You tolerated the procedure well without any complications.   Please follow-up with your surgeon, Dr. Lajoyce Cornersuda, [facility] in 1 week for post-operative evaluation. In the meantime, please use your walker to get around in order to keep weight off your foot while it heals. Please  keep your wound dry until your stitches are removed. Carefully wash the wound with soap and water. Dry the area and put on new, clean bandages as directed. Change your bandages when they get wet or dirty. Check your incision area as directed. You may take your prescribed opioid medications for pain control but only take these as directed.   [facility] [phone]) or present to the Emergency Department if you notice signs of infection such as redness, swelling, or drainage from your wound or if you experience nausea, vomiting, or fever over 102 degrees.   Signed: Dimas AlexandriaFlannigan, Sean A, Medical Student 03/13/2019, 9:38 AM   Pager: [phone]  Attestation for Student Documentation:  I personally  was present and performed or re-performed the history, physical exam and medical decision-making activities of this service and have verified that the service and findings are accurately documented in the student's note.  Earlene Plater, MD Internal Medicine, PGY1 Pager: [phone]  03/13/2019,11:28 AM

## 2019-03-17 DIAGNOSIS — G4733 Obstructive sleep apnea (adult) (pediatric): Secondary | ICD-10-CM | POA: Diagnosis not present

## 2019-03-20 ENCOUNTER — Ambulatory Visit (INDEPENDENT_AMBULATORY_CARE_PROVIDER_SITE_OTHER): Payer: Medicaid Other

## 2019-03-20 ENCOUNTER — Other Ambulatory Visit: Payer: Self-pay

## 2019-03-20 ENCOUNTER — Encounter: Payer: Self-pay | Admitting: Orthopedic Surgery

## 2019-03-20 ENCOUNTER — Ambulatory Visit (INDEPENDENT_AMBULATORY_CARE_PROVIDER_SITE_OTHER): Payer: Medicaid Other | Admitting: Orthopedic Surgery

## 2019-03-20 VITALS — Ht 66.0 in | Wt 233.0 lb

## 2019-03-20 DIAGNOSIS — M25511 Pain in right shoulder: Secondary | ICD-10-CM

## 2019-03-20 DIAGNOSIS — M86672 Other chronic osteomyelitis, left ankle and foot: Secondary | ICD-10-CM

## 2019-03-20 MED ORDER — OXYCODONE-ACETAMINOPHEN 5-325 MG PO TABS: 1.0000 | ORAL_TABLET | ORAL | 0 refills | Status: DC | PRN

## 2019-03-20 NOTE — Progress Notes (Signed)
Office Visit Note   [name]           Date of Birth: May 09, 1955           MRN: [id number] Visit Date: 03/20/2019              Requested by: No referring provider defined for this encounter. PCP: System, Provider Not In  Chief Complaint  Patient presents with  . Left Foot - Routine Post Op    03/12/19 left 5th toe amputation   . Right Shoulder - Pain    S/p fall 03/18/19      HPI: This is a pleasant gentleman who is 1 week status post left fifth toe amputation.  With regards to this he is doing well and trying to avoid weightbearing.  He is concerned that he had a fall onto his right shoulder on Tuesday.  He wants to make sure that he has not dislocated it or fractured it.  It is very tender to motion.  He is able to lift it with the assistance of his other hand and once reaches over his head the pain dissipates he has had some trouble sleeping at night as well  Assessment & Plan: Visit Diagnoses:  1. Acute pain of right shoulder     Plan: With regards to his foot he may wash this with mild soap and water.  He is not to immerse it.  He may balance on his heel and in the shower but should otherwise remain nonweightbearing.  With regards to his shoulder I have showed him some pendulum exercises.  He is at a higher risk for adhesive capsulitis so I emphasized the importance of range of motion.  I did provide him with a prescription for pain medication he will follow-up in 1 week.  We also talked about his difficulty sleeping I suggested him using his recliner to sleep at night so he can elevate his head Follow-Up Instructions: No follow-ups on file.   Ortho Exam  Patient is alert, oriented, no adenopathy, well-dressed, normal affect, normal respiratory effort. Left foot: He does have some bleeding when the dressing was removed otherwise healing surgical incision no surrounding erythema or cellulitis and swelling is controlled pulses are intact Right shoulder he is tender  over the rotator cuff.  He does have internal rotation with minimal pain forward elevation is difficult secondary to pain and he uses his other arm to assist him once overhead he is fine.  Distal CMS is intact  Imaging: No results found. No images are attached to the encounter.  Labs: Lab Results  Component Value Date   HGBA1C 7.6 (H) 03/11/2019   ESRSEDRATE 11 03/11/2019   CRP 1.8 (H) 03/11/2019   REPTSTATUS 07/24/2016 FINAL 07/19/2016   GRAMSTAIN  07/19/2016    RARE WBC PRESENT, PREDOMINANTLY MONONUCLEAR NO ORGANISMS SEEN    CULT NO GROWTH 5 DAYS 07/19/2016     Lab Results  Component Value Date   ALBUMIN 3.7 03/11/2019    No results found for: MG No results found for: VD25OH  No results found for: PREALBUMIN CBC EXTENDED Latest Ref Rng & Units 03/13/2019 03/12/2019 03/11/2019  WBC 4.0 - 10.5 K/uL 9.5 9.2 9.1  RBC 4.22 - 5.81 MIL/uL 4.43 4.58 4.71  HGB 13.0 - 17.0 g/dL 78.2 95.6 21.3  HCT 08.6 - 52.0 % 42.4 44.0 46.4  PLT 150 - 400 K/uL 197 198 206  NEUTROABS 1.7 - 7.7 K/uL - - 5.5  LYMPHSABS 0.7 -  4.0 K/uL - - 2.7     Body mass index is 37.61 kg/m.  Orders:  Orders Placed This Encounter  Procedures  . XR Shoulder Right   Meds ordered this encounter  Medications  . oxyCODONE-acetaminophen (PERCOCET/ROXICET) 5-325 MG tablet    Sig: Take 1 tablet by mouth every 4 (four) hours as needed for severe pain.    Dispense:  30 tablet    Refill:  0     Procedures: No procedures performed  Clinical Data: No additional findings.  ROS:  All other systems negative, except as noted in the HPI. Review of Systems  Objective: Vital Signs: Ht 5\' 6"  (1.676 m)   Wt 233 lb (105.7 kg)   BMI 37.61 kg/m   Specialty Comments:  No specialty comments available.  PMFS History: Patient Active Problem List   Diagnosis Date Noted  . Osteomyelitis ([place]) 03/11/2019  . Osteomyelitis of fifth toe of left foot ([place])   . Type 2 diabetes mellitus with foot ulcer, without  long-term current use of insulin ([place])   . 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 S/P debridement 07/19/2016  . Acute gout of left elbow 07/19/2016   Past Medical History:  Diagnosis Date  . 5 years ago   . AICD (automatic cardioverter/defibrillator) present   . CHF (congestive heart failure) ([place])   . Coronary artery disease   . Diabetes mellitus without complication ([place])   . Dysrhythmia   . Hypertension   . Peripheral vascular disease ([place])   . Peripheral vascular disease ([place])   . Sleep apnea     Family History  Problem Relation Age of Onset  . Heart disease Mother   . Diabetes Mother   . Peripheral vascular disease Mother     Past Surgical History:  Procedure Laterality Date  . AMPUTATION Left 03/12/2019   Procedure: 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;  Surgeon: [name], MD;  Location: [place];  Service: Orthopedics;  Laterality: Left;  . CARDIAC DEFIBRILLATOR PLACEMENT    . I & D EXTREMITY Left 07/19/2016   Procedure: IRRIGATION AND DEBRIDEMENT EXTREMITY/OLECRANON(WASHOUT);  Surgeon: Leandrew Koyanagi, MD;  Location: [place];  Service: Orthopedics;  Laterality: Left;  . OLECRANON BURSECTOMY Left 07/19/2016   Procedure: LEFT ELBOW OLECRANON BURSECTOMY;  Surgeon: Leandrew Koyanagi, MD;  Location: [place];  Service: Orthopedics;  Laterality: Left;   Social History   Occupational History  . Not on file  Tobacco Use  . Smoking status: Current Every Day Smoker    Packs/day: 1.00    Years: 40.00    Pack years: 40.00  . Smokeless tobacco: Never Used  Substance and Sexual Activity  . Alcohol use: Not Currently  . Drug use: Never  . Sexual activity: Not on file

## 2019-03-27 ENCOUNTER — Ambulatory Visit (INDEPENDENT_AMBULATORY_CARE_PROVIDER_SITE_OTHER): Payer: Medicaid Other | Admitting: Orthopedic Surgery

## 2019-03-27 ENCOUNTER — Other Ambulatory Visit: Payer: Self-pay

## 2019-03-27 ENCOUNTER — Other Ambulatory Visit: Payer: Self-pay | Admitting: Physician Assistant

## 2019-03-27 ENCOUNTER — Encounter: Payer: Self-pay | Admitting: Orthopedic Surgery

## 2019-03-27 VITALS — Ht 66.0 in | Wt 233.0 lb

## 2019-03-27 DIAGNOSIS — M25511 Pain in right shoulder: Secondary | ICD-10-CM

## 2019-03-27 DIAGNOSIS — M869 Osteomyelitis, unspecified: Secondary | ICD-10-CM

## 2019-03-27 MED ORDER — METHYLPREDNISOLONE ACETATE 40 MG/ML IJ SUSP
40.0000 mg | INTRAMUSCULAR | Status: AC | PRN
  Administered 2019-03-27: 40 mg via INTRA_ARTICULAR

## 2019-03-27 MED ORDER — OXYCODONE-ACETAMINOPHEN 5-325 MG PO TABS: 1.0000 | ORAL_TABLET | ORAL | 0 refills | Status: DC | PRN

## 2019-03-27 MED ORDER — LIDOCAINE HCL 1 % IJ SOLN
5.0000 mL | INTRAMUSCULAR | Status: AC | PRN
  Administered 2019-03-27: 5 mL

## 2019-03-27 NOTE — Progress Notes (Signed)
Office Visit Note   [name]           Date of Birth: 1955/12/09           MRN: [id number] Visit Date: 03/27/2019              Requested by: No referring provider defined for this encounter. PCP: System, Provider Not In  Chief Complaint  Patient presents with  . Left Foot - Routine Post Op    03/12/19 left foot 5th toe amputation   . Right Shoulder - Follow-up      HPI: This is a pleasant gentleman who is 17 days status post fifth toe amputation he is doing well.  His biggest complaint is continued right shoulder pain.  X-rays were taken last week after he had fallen on this there were negative for fracture.  He states he has difficulty sleeping at night and is awakened when he tries to turn because of his right shoulder  Assessment & Plan: Visit Diagnoses: No diagnosis found.  Plan: Surgical sutures will be harvested today.  I would still like him to limit his weightbearing for the next week.  He will follow up in 1 week.  He was asking for more pain medication because of his shoulder.  I did tell him that I think he would do better having an injection today he has agreed to this  Follow-Up Instructions: No follow-ups on file.   Ortho Exam  Patient is alert, oriented, no adenopathy, well-dressed, normal affect, normal respiratory effort. Focused examination of his foot demonstrates well-healed surgical incision there is no drainage at this and has healed, minimal soft tissue swelling.  Right shoulder he is able to have full range of motion but has pain with abduction and internal rotation.  Imaging: No results found. No images are attached to the encounter.  Labs: Lab Results  Component Value Date   HGBA1C 7.6 (H) 03/11/2019   ESRSEDRATE 11 03/11/2019   CRP 1.8 (H) 03/11/2019   REPTSTATUS 07/24/2016 FINAL 07/19/2016   GRAMSTAIN  07/19/2016    RARE WBC PRESENT, PREDOMINANTLY MONONUCLEAR NO ORGANISMS SEEN    CULT NO GROWTH 5 DAYS 07/19/2016     Lab  Results  Component Value Date   ALBUMIN 3.7 03/11/2019    No results found for: MG No results found for: VD25OH  No results found for: PREALBUMIN CBC EXTENDED Latest Ref Rng & Units 03/13/2019 03/12/2019 03/11/2019  WBC 4.0 - 10.5 K/uL 9.5 9.2 9.1  RBC 4.22 - 5.81 MIL/uL 4.43 4.58 4.71  HGB 13.0 - 17.0 g/dL 35.3 61.4 43.1  HCT 54.0 - 52.0 % 42.4 44.0 46.4  PLT 150 - 400 K/uL 197 198 206  NEUTROABS 1.7 - 7.7 K/uL - - 5.5  LYMPHSABS 0.7 - 4.0 K/uL - - 2.7     Body mass index is 37.61 kg/m.  Orders:  No orders of the defined types were placed in this encounter.  No orders of the defined types were placed in this encounter.    Procedures: Large Joint Inj: R glenohumeral on 03/27/2019 9:04 AM Indications: diagnostic evaluation and pain Details: 22 G 1.5 in needle, posterior approach  Arthrogram: No  Medications: 5 mL lidocaine 1 %; 40 mg methylPREDNISolone acetate 40 MG/ML Outcome: tolerated well, no immediate complications Procedure, treatment alternatives, risks and benefits explained, specific risks discussed. Consent was given by the patient. Immediately prior to procedure a time out was called to verify the correct patient, procedure, equipment, support staff  and site/side marked as required. Patient was prepped and draped in the usual sterile fashion.      Clinical Data: No additional findings.  ROS:  All other systems negative, except as noted in the HPI. Review of Systems  Objective: Vital Signs: Ht 5\' 6"  (1.676 m)   Wt 233 lb (105.7 kg)   BMI 37.61 kg/m   Specialty Comments:  No specialty comments available.  PMFS History: Patient Active Problem List   Diagnosis Date Noted  . Osteomyelitis ([place]) 03/11/2019  . Osteomyelitis of fifth toe of left foot ([place])   . Type 2 diabetes mellitus with foot ulcer, without long-term current use of insulin ([place])   . 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 S/P debridement 07/19/2016  .  Acute gout of left elbow 07/19/2016   Past Medical History:  Diagnosis Date  . 5 years ago   . AICD (automatic cardioverter/defibrillator) present   . CHF (congestive heart failure) ([place])   . Coronary artery disease   . Diabetes mellitus without complication ([place])   . Dysrhythmia   . Hypertension   . Peripheral vascular disease ([place])   . Peripheral vascular disease ([place])   . Sleep apnea     Family History  Problem Relation Age of Onset  . Heart disease Mother   . Diabetes Mother   . Peripheral vascular disease Mother     Past Surgical History:  Procedure Laterality Date  . AMPUTATION Left 03/12/2019   Procedure: 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;  Surgeon: [name], MD;  Location: [facility];  Service: Orthopedics;  Laterality: Left;  . CARDIAC DEFIBRILLATOR PLACEMENT    . I & D EXTREMITY Left 07/19/2016   Procedure: IRRIGATION AND DEBRIDEMENT EXTREMITY/OLECRANON(WASHOUT);  Surgeon: Leandrew Koyanagi, MD;  Location: [facility];  Service: Orthopedics;  Laterality: Left;  . OLECRANON BURSECTOMY Left 07/19/2016   Procedure: LEFT ELBOW OLECRANON BURSECTOMY;  Surgeon: Leandrew Koyanagi, MD;  Location: [facility];  Service: Orthopedics;  Laterality: Left;   Social History   Occupational History  . Not on file  Tobacco Use  . Smoking status: Current Every Day Smoker    Packs/day: 1.00    Years: 40.00    Pack years: 40.00  . Smokeless tobacco: Never Used  Substance and Sexual Activity  . Alcohol use: Not Currently  . Drug use: Never  . Sexual activity: Not on file

## 2019-04-10 ENCOUNTER — Other Ambulatory Visit: Payer: Self-pay

## 2019-04-10 ENCOUNTER — Ambulatory Visit (INDEPENDENT_AMBULATORY_CARE_PROVIDER_SITE_OTHER): Payer: Medicaid Other | Admitting: Orthopedic Surgery

## 2019-04-10 ENCOUNTER — Encounter: Payer: Self-pay | Admitting: Orthopedic Surgery

## 2019-04-10 DIAGNOSIS — M25512 Pain in left shoulder: Secondary | ICD-10-CM

## 2019-04-10 DIAGNOSIS — M869 Osteomyelitis, unspecified: Secondary | ICD-10-CM

## 2019-04-10 MED ORDER — LIDOCAINE HCL 1 % IJ SOLN
5.0000 mL | INTRAMUSCULAR | Status: AC | PRN
  Administered 2019-04-10: 5 mL

## 2019-04-10 MED ORDER — METHYLPREDNISOLONE ACETATE 40 MG/ML IJ SUSP
40.0000 mg | INTRAMUSCULAR | Status: AC | PRN
  Administered 2019-04-10: 40 mg via INTRA_ARTICULAR

## 2019-04-10 NOTE — Progress Notes (Signed)
Office Visit Note   [name]           Date of Birth: 22-Dec-1955           MRN: [id number] Visit Date: 04/10/2019              Requested by: No referring provider defined for this encounter. PCP: System, Provider Not In  Chief Complaint  Patient presents with  . Right Shoulder - Follow-up  . Left Foot - Follow-up      HPI: This is a pleasant gentleman who follows up for his fifth toe amputation about a month ago.  With regards to this he is doing quite well.  He also is following up on right shoulder pain after a fall.  I did inject him with steroid at his last visit he said that helped significantly but the pain is now returned  Assessment & Plan: Visit Diagnoses: No diagnosis found.  Plan: We will follow-up in 1 month.  If he has persistent shoulder pain we could discuss a shoulder arthroscopy.  He does have a history of sleep apnea so this would have to be done at the outpatient at the hospital Follow-Up Instructions: No follow-ups on file.   Ortho Exam  Patient is alert, oriented, no adenopathy, well-dressed, normal affect, normal respiratory effort. Right shoulder increased pain with resisted abduction with internal rotation behind his back.  Fifth toe amputation incision and just very small eschar no surrounding cellulitis or drainage swelling is well controlled Imaging: No results found. No images are attached to the encounter.  Labs: Lab Results  Component Value Date   HGBA1C 7.6 (H) 03/11/2019   ESRSEDRATE 11 03/11/2019   CRP 1.8 (H) 03/11/2019   REPTSTATUS 07/24/2016 FINAL 07/19/2016   GRAMSTAIN  07/19/2016    RARE WBC PRESENT, PREDOMINANTLY MONONUCLEAR NO ORGANISMS SEEN    CULT NO GROWTH 5 DAYS 07/19/2016     Lab Results  Component Value Date   ALBUMIN 3.7 03/11/2019    No results found for: MG No results found for: VD25OH  No results found for: PREALBUMIN CBC EXTENDED Latest Ref Rng & Units 03/13/2019 03/12/2019 03/11/2019  WBC 4.0  - 10.5 K/uL 9.5 9.2 9.1  RBC 4.22 - 5.81 MIL/uL 4.43 4.58 4.71  HGB 13.0 - 17.0 g/dL 85.4 62.7 03.5  HCT 00.9 - 52.0 % 42.4 44.0 46.4  PLT 150 - 400 K/uL 197 198 206  NEUTROABS 1.7 - 7.7 K/uL - - 5.5  LYMPHSABS 0.7 - 4.0 K/uL - - 2.7     There is no height or weight on file to calculate BMI.  Orders:  No orders of the defined types were placed in this encounter.  No orders of the defined types were placed in this encounter.    Procedures: Large Joint Inj on 04/10/2019 9:57 AM Indications: diagnostic evaluation and pain Details: 22 G 1.5 in needle, posterior approach  Arthrogram: No  Medications: 5 mL lidocaine 1 %; 40 mg methylPREDNISolone acetate 40 MG/ML Outcome: tolerated well, no immediate complications Procedure, treatment alternatives, risks and benefits explained, specific risks discussed. Consent was given by the patient. Immediately prior to procedure a time out was called to verify the correct patient, procedure, equipment, support staff and site/side marked as required. Patient was prepped and draped in the usual sterile fashion.      Clinical Data: No additional findings.  ROS:  All other systems negative, except as noted in the HPI. Review of Systems  Objective: Vital Signs:  There were no vitals taken for this visit.  Specialty Comments:  No specialty comments available.  PMFS History: Patient Active Problem List   Diagnosis Date Noted  . Osteomyelitis ([place]) 03/11/2019  . Osteomyelitis of fifth toe of left foot ([place])   . Type 2 diabetes mellitus with foot ulcer, without long-term current use of insulin ([place])   . 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 S/P debridement 07/19/2016  . Acute gout of left elbow 07/19/2016   Past Medical History:  Diagnosis Date  . 5 years ago   . AICD (automatic cardioverter/defibrillator) present   . CHF (congestive heart failure) ([place])   . Coronary artery disease   . Diabetes mellitus  without complication ([name])   . Dysrhythmia   . Hypertension   . Peripheral vascular disease ([place])   . Peripheral vascular disease ([place])   . Sleep apnea     Family History  Problem Relation Age of Onset  . Heart disease Mother   . Diabetes Mother   . Peripheral vascular disease Mother     Past Surgical History:  Procedure Laterality Date  . AMPUTATION Left 03/12/2019   Procedure: 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;  Surgeon: [name], MD;  Location: [facility];  Service: Orthopedics;  Laterality: Left;  . CARDIAC DEFIBRILLATOR PLACEMENT    . I & D EXTREMITY Left 07/19/2016   Procedure: IRRIGATION AND DEBRIDEMENT EXTREMITY/OLECRANON(WASHOUT);  Surgeon: Leandrew Koyanagi, MD;  Location: [facility];  Service: Orthopedics;  Laterality: Left;  . OLECRANON BURSECTOMY Left 07/19/2016   Procedure: LEFT ELBOW OLECRANON BURSECTOMY;  Surgeon: Leandrew Koyanagi, MD;  Location: [facility];  Service: Orthopedics;  Laterality: Left;   Social History   Occupational History  . Not on file  Tobacco Use  . Smoking status: Current Every Day Smoker    Packs/day: 1.00    Years: 40.00    Pack years: 40.00  . Smokeless tobacco: Never Used  Substance and Sexual Activity  . Alcohol use: Not Currently  . Drug use: Never  . Sexual activity: Not on file

## 2019-04-16 DIAGNOSIS — G4733 Obstructive sleep apnea (adult) (pediatric): Secondary | ICD-10-CM | POA: Diagnosis not present

## 2019-04-24 DIAGNOSIS — I2510 Atherosclerotic heart disease of native coronary artery without angina pectoris: Secondary | ICD-10-CM | POA: Diagnosis not present

## 2019-04-24 DIAGNOSIS — I739 Peripheral vascular disease, unspecified: Secondary | ICD-10-CM | POA: Diagnosis not present

## 2019-04-24 DIAGNOSIS — I519 Heart disease, unspecified: Secondary | ICD-10-CM | POA: Diagnosis not present

## 2019-04-24 DIAGNOSIS — I70212 Atherosclerosis of native arteries of extremities with intermittent claudication, left leg: Secondary | ICD-10-CM | POA: Diagnosis not present

## 2019-04-25 DIAGNOSIS — Z87891 Personal history of nicotine dependence: Secondary | ICD-10-CM | POA: Diagnosis not present

## 2019-04-25 DIAGNOSIS — Z6838 Body mass index (BMI) 38.0-38.9, adult: Secondary | ICD-10-CM | POA: Diagnosis not present

## 2019-04-25 DIAGNOSIS — E119 Type 2 diabetes mellitus without complications: Secondary | ICD-10-CM | POA: Diagnosis not present

## 2019-04-25 DIAGNOSIS — I739 Peripheral vascular disease, unspecified: Secondary | ICD-10-CM | POA: Diagnosis not present

## 2019-04-25 DIAGNOSIS — I70213 Atherosclerosis of native arteries of extremities with intermittent claudication, bilateral legs: Secondary | ICD-10-CM | POA: Diagnosis not present

## 2019-04-25 DIAGNOSIS — I255 Ischemic cardiomyopathy: Secondary | ICD-10-CM | POA: Diagnosis not present

## 2019-04-25 DIAGNOSIS — E669 Obesity, unspecified: Secondary | ICD-10-CM | POA: Diagnosis not present

## 2019-04-25 DIAGNOSIS — I10 Essential (primary) hypertension: Secondary | ICD-10-CM | POA: Diagnosis not present

## 2019-04-25 DIAGNOSIS — E782 Mixed hyperlipidemia: Secondary | ICD-10-CM | POA: Diagnosis not present

## 2019-04-25 DIAGNOSIS — I2510 Atherosclerotic heart disease of native coronary artery without angina pectoris: Secondary | ICD-10-CM | POA: Diagnosis not present

## 2019-04-28 DIAGNOSIS — I70219 Atherosclerosis of native arteries of extremities with intermittent claudication, unspecified extremity: Secondary | ICD-10-CM | POA: Diagnosis not present

## 2019-05-08 ENCOUNTER — Encounter: Payer: Self-pay | Admitting: Orthopedic Surgery

## 2019-05-08 ENCOUNTER — Ambulatory Visit (INDEPENDENT_AMBULATORY_CARE_PROVIDER_SITE_OTHER): Payer: Medicaid Other | Admitting: Physician Assistant

## 2019-05-08 ENCOUNTER — Ambulatory Visit: Payer: Self-pay

## 2019-05-08 ENCOUNTER — Ambulatory Visit (INDEPENDENT_AMBULATORY_CARE_PROVIDER_SITE_OTHER): Payer: Medicaid Other

## 2019-05-08 ENCOUNTER — Other Ambulatory Visit: Payer: Self-pay | Admitting: Physician Assistant

## 2019-05-08 ENCOUNTER — Other Ambulatory Visit: Payer: Self-pay

## 2019-05-08 ENCOUNTER — Telehealth: Payer: Self-pay

## 2019-05-08 VITALS — Ht 66.0 in | Wt 233.0 lb

## 2019-05-08 DIAGNOSIS — M25562 Pain in left knee: Secondary | ICD-10-CM

## 2019-05-08 DIAGNOSIS — M25561 Pain in right knee: Secondary | ICD-10-CM

## 2019-05-08 DIAGNOSIS — G8929 Other chronic pain: Secondary | ICD-10-CM | POA: Diagnosis not present

## 2019-05-08 MED ORDER — LIDOCAINE HCL 1 % IJ SOLN
1.0000 mL | INTRAMUSCULAR | Status: AC | PRN
  Administered 2019-05-08: 1 mL

## 2019-05-08 MED ORDER — OXYCODONE-ACETAMINOPHEN 5-325 MG PO TABS: 1.0000 | ORAL_TABLET | Freq: Three times a day (TID) | ORAL | 0 refills | Status: DC | PRN

## 2019-05-08 MED ORDER — METHYLPREDNISOLONE ACETATE 40 MG/ML IJ SUSP
40.0000 mg | INTRAMUSCULAR | Status: AC | PRN
  Administered 2019-05-08: 40 mg via INTRA_ARTICULAR

## 2019-05-08 MED ORDER — LIDOCAINE HCL 1 % IJ SOLN
1.0000 mL | INTRAMUSCULAR | Status: AC | PRN
  Administered 2019-05-08: 12:00:00 1 mL

## 2019-05-08 NOTE — Telephone Encounter (Signed)
Medicaid prior auth [employer] obtained for today's rx for Percocet 5/325 Approval # [id number]

## 2019-05-08 NOTE — Progress Notes (Signed)
Office Visit Note   [name]           Date of Birth: 1955/08/08           MRN: [id number] Visit Date: 05/08/2019              Requested by: No referring provider defined for this encounter. PCP: System, Provider Not In  Chief Complaint  Patient presents with  . Left Foot - Follow-up    Left fifth toe amputation on 03/12/2019      HPI: This is a pleasant 64 year old gentleman who comes in for 3 issues.  He is 2 months status post left fifth toe amputation with regards to this he is doing well and does not have any pain in his left foot  He is has also been followed for right shoulder pain and impingement symptoms.  He has been given injections which helped but are not long-lasting.  We discussed that if he did not continue to have improvement we could discuss shoulder arthroscopy he would like to now go forward with this if possible  Finally he has a history of bilateral knee arthritis.  He was being seen by a clinic in [place] who would recommended cortisone injections.  They did not give them to him because he was told his blood sugars were too high.  His glucose is now under good control and his sugars are in the low 100s in the morning he would like to go forward with injection into his painful knees the left is more painful than the right  Assessment & Plan: Visit Diagnoses:  1. Chronic pain of both knees     Plan: He may follow-up as needed for his left foot.  He is having significant pain at night trying to sleep with his right shoulder.  He takes 1 oxycodone at night.  I will give him a refill but he understands that this may cause more difficulties with pain control after his shoulder surgery.  But regarding his knees we did go forward and inject his knees today.  Given the advanced nature of his arthritis we could try viscosupplementation though I have some speculation on how much that would help him.  Ultimately he may need knee replacements I will schedule his  right shoulder arthroscopy.  Because of his sleep apnea this will be done at the operating room I reviewed the risks of his shoulder surgery which include bleeding infection anesthesia complications I also reviewed surgical outcomes and he understands that this may not relieve all of his pain.  We also reviewed the recovery  Follow-Up Instructions: No follow-ups on file.   Ortho Exam  Patient is alert, oriented, no adenopathy, well-dressed, normal affect, normal respiratory effort. Focused examination of his left foot demonstrates well-healed amputation stump.  No swelling no fluctuance skin is in excellent condition no cellulitis  Right shoulder.  Abduction past 90 is quite painful.  Difficulty with internal rotation and forward elevation.  Focused and shoulder joint.  Positive impingement findings  Knees: Bilateral knees he does have a significant varus especially on his left knee.  Pain with range of motion with mild soft tissue swelling but no cellulitis pain is focused over the joint line.  No effusion on either knee is noted  Imaging: No results found. No images are attached to the encounter.  Labs: Lab Results  Component Value Date   HGBA1C 7.6 (H) 03/11/2019   ESRSEDRATE 11 03/11/2019   CRP 1.8 (H) 03/11/2019  REPTSTATUS 07/24/2016 FINAL 07/19/2016   GRAMSTAIN  07/19/2016    RARE WBC PRESENT, PREDOMINANTLY MONONUCLEAR NO ORGANISMS SEEN    CULT NO GROWTH 5 DAYS 07/19/2016     Lab Results  Component Value Date   ALBUMIN 3.7 03/11/2019    No results found for: MG No results found for: VD25OH  No results found for: PREALBUMIN CBC EXTENDED Latest Ref Rng & Units 03/13/2019 03/12/2019 03/11/2019  WBC 4.0 - 10.5 K/uL 9.5 9.2 9.1  RBC 4.22 - 5.81 MIL/uL 4.43 4.58 4.71  HGB 13.0 - 17.0 g/dL 28.4 13.2 44.0  HCT 10.2 - 52.0 % 42.4 44.0 46.4  PLT 150 - 400 K/uL 197 198 206  NEUTROABS 1.7 - 7.7 K/uL - - 5.5  LYMPHSABS 0.7 - 4.0 K/uL - - 2.7     Body mass index is 37.61  kg/m.  Orders:  Orders Placed This Encounter  Procedures  . XR Knee 1-2 Views Right  . XR Knee 1-2 Views Left   No orders of the defined types were placed in this encounter.    Procedures: Large Joint Inj: bilateral knee on 05/08/2019 12:06 PM Indications: pain and diagnostic evaluation Details: 22 G 1.5 in needle, anterolateral approach  Arthrogram: No  Medications (Right): 1 mL lidocaine 1 %; 40 mg methylPREDNISolone acetate 40 MG/ML Medications (Left): 1 mL lidocaine 1 %; 40 mg methylPREDNISolone acetate 40 MG/ML Outcome: tolerated well, no immediate complications Procedure, treatment alternatives, risks and benefits explained, specific risks discussed. Consent was given by the patient. Immediately prior to procedure a time out was called to verify the correct patient, procedure, equipment, support staff and site/side marked as required. Patient was prepped and draped in the usual sterile fashion.      Clinical Data: No additional findings.  ROS:  All other systems negative, except as noted in the HPI. Review of Systems  Objective: Vital Signs: Ht 5\' 6"  (1.676 m)   Wt 233 lb (105.7 kg)   BMI 37.61 kg/m   Specialty Comments:  No specialty comments available.  PMFS History: Patient Active Problem List   Diagnosis Date Noted  . Osteomyelitis ([name]) 03/11/2019  . Osteomyelitis of fifth toe of left foot ([name])   . Type 2 diabetes mellitus with foot ulcer, without long-term current use of insulin ([name])   . 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 S/P debridement 07/19/2016  . Acute gout of left elbow 07/19/2016   Past Medical History:  Diagnosis Date  . 5 years ago   . AICD (automatic cardioverter/defibrillator) present   . CHF (congestive heart failure) ([name])   . Coronary artery disease   . Diabetes mellitus without complication ([name])   . Dysrhythmia   . Hypertension   . Peripheral vascular disease ([name])   . Peripheral vascular disease  ([name])   . Sleep apnea     Family History  Problem Relation Age of Onset  . Heart disease Mother   . Diabetes Mother   . Peripheral vascular disease Mother     Past Surgical History:  Procedure Laterality Date  . AMPUTATION Left 03/12/2019   Procedure: 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;  Surgeon: 03/14/2019, MD;  Location: [facility] OR;  Service: Orthopedics;  Laterality: Left;  . CARDIAC DEFIBRILLATOR PLACEMENT    . I & D EXTREMITY Left 07/19/2016   Procedure: IRRIGATION AND DEBRIDEMENT EXTREMITY/OLECRANON(WASHOUT);  Surgeon: 09/18/2016, MD;  Location: [facility] OR;  Service: Orthopedics;  Laterality: Left;  . OLECRANON BURSECTOMY Left  07/19/2016   Procedure: LEFT ELBOW OLECRANON BURSECTOMY;  Surgeon: Tarry Kos, MD;  Location: [facility];  Service: Orthopedics;  Laterality: Left;   Social History   Occupational History  . Not on file  Tobacco Use  . Smoking status: Current Every Day Smoker    Packs/day: 1.00    Years: 40.00    Pack years: 40.00  . Smokeless tobacco: Never Used  Substance and Sexual Activity  . Alcohol use: Not Currently  . Drug use: Never  . Sexual activity: Not on file

## 2019-05-13 ENCOUNTER — Telehealth: Payer: Self-pay | Admitting: Orthopedic Surgery

## 2019-05-13 NOTE — Telephone Encounter (Signed)
Pt called in said he has been trying to reach you for the past 3 days in regards to scheduling his surgery with dr.duda, I did offer to transfer pt and advised him that you're the only one who schedules for dr.duda but pt asked not to be transferred, wanted to hear from someone today or he said he would come into the office and scream at you in your face. Pt started to raise his voice and I advised pt that there was no need for that and that I'd send a message.    [phone]

## 2019-05-14 ENCOUNTER — Other Ambulatory Visit (HOSPITAL_COMMUNITY)
Admission: RE | Admit: 2019-05-14 | Discharge: 2019-05-14 | Disposition: A | Discharge status: Home / Self Care | Payer: Medicaid Other | Source: Ambulatory Visit | Attending: Orthopedic Surgery | Admitting: Orthopedic Surgery

## 2019-05-14 ENCOUNTER — Other Ambulatory Visit: Payer: Self-pay | Admitting: Physician Assistant

## 2019-05-14 DIAGNOSIS — Z01812 Encounter for preprocedural laboratory examination: Secondary | ICD-10-CM | POA: Diagnosis not present

## 2019-05-14 DIAGNOSIS — Z20822 Contact with and (suspected) exposure to covid-19: Secondary | ICD-10-CM | POA: Insufficient documentation

## 2019-05-14 LAB — SARS CORONAVIRUS 2 (TAT 6-24 HRS): SARS Coronavirus 2: NEGATIVE

## 2019-05-15 ENCOUNTER — Encounter (HOSPITAL_COMMUNITY): Payer: Self-pay | Admitting: Orthopedic Surgery

## 2019-05-15 ENCOUNTER — Other Ambulatory Visit: Payer: Self-pay

## 2019-05-15 NOTE — Progress Notes (Signed)
Pt denies SOB and chest pain. Pt stated that he is under the care of Dr. Neale Burly, Cardiology and Rickey Primus, [place], PCP. Pt statted that a stress test was performed "  in [place] in 2013 or 2014." Pt denies having a chest x ray in the last year. Pt denies recent labs. Nurse requested EKG tracing from [facility]; awaiting response. Nurse faxed Peri-op Prescription for [facility] to [facility]; awaiting response. Pt made aware to stop taking vitamins, fish oil and herbal medications. Do not take any NSAIDs ie: Ibuprofen, Advil, Naproxen (Aleve), Motrin, BC and Goody Powder. Pt made aware to hold Glipizide after lunch dose today and to hold Jardiance today. Pt made aware to take no diabetes medication on DOS. Pt made aware to check CBG every 2 hours prior to arrival to hospital on DOS. Pt made aware to treat a CBG < 70 with 4  ounces of apple juice, wait 15 minutes after intervention to recheck CBG, if CBG remains < 70, call Short Stay unit to speak with a nurse. Pt reminded to quarantine. Pt verbalized understanding of all pre-op instructions. PA, Anesthesiology, asked to review pt history.

## 2019-05-15 NOTE — Progress Notes (Signed)
Anesthesia Chart Review:  Follows with cardiology at [facility] for hx of CAD s/p MI 2014, ischemic cardiomyopathy LVEF 35% in 2018. Med[employer]onic ICD implanted 2018.  Last seen by cardiology 04/25/19. Per note, "Coronary artery disease involving native coronary artery of native heart without angina pectoris: He is currently asymptomatic with reasonable (but not good) physical activity. I congratulated him for quitting cigarettes. ECG is stable over the last 3 years. His LVEF is historically 35%. ICD implanted 2018."  Clearance from EP cardiology 05/15/19 states, "Mr. Sitar is followed [facility] EP clinic for his ICD. He remains stable without recent events. He requires no further evaluation for his ICD prior to his upcoming surgery. Please place a magnet during surgery and remove following the procedure."  Per telephone encounter in care everywhere he also has clearance from general cardiology; Dr. Audrie Lia office still awaiting receipt of fax as of 5pm 05/15/18.  Poorly controlled DMII, A1c 8.2 on 04/25/19.  He will need DOS labs and eval.  ECG 04/25/19 (narrative per cardiology note same date, tracing requested): NSR at 39; old IMI; compared with 08/11/16, no significant change.  Echo 04/2016 ([facility]): LVEF 35%, with global hypo and mild LV dilation; nl RV systolic fn; trivial valvular regurgitation, no stenosis; no change from 2016.  Zannie Cove [facility] Short Stay Center/Anesthesiology Phone [phone] 05/15/2019 5:09 PM

## 2019-05-15 NOTE — Progress Notes (Signed)
Nurse lvm with Elnita Maxwell, Surgical Coordinator, regarding pt pre-op instructions for both Aspirin and Plavix. Pt stated that he had not been given any instructions. Nurse awaiting a return call.

## 2019-05-16 ENCOUNTER — Ambulatory Visit (HOSPITAL_COMMUNITY): Payer: Medicaid Other | Admitting: Physician Assistant

## 2019-05-16 ENCOUNTER — Encounter (HOSPITAL_COMMUNITY): Payer: Self-pay | Admitting: Orthopedic Surgery

## 2019-05-16 ENCOUNTER — Other Ambulatory Visit: Payer: Self-pay

## 2019-05-16 ENCOUNTER — Ambulatory Visit (HOSPITAL_COMMUNITY)
Admission: RE | Admit: 2019-05-16 | Discharge: 2019-05-16 | Disposition: A | Discharge status: Home / Self Care | Payer: Medicaid Other | Attending: Orthopedic Surgery | Admitting: Orthopedic Surgery

## 2019-05-16 ENCOUNTER — Encounter (HOSPITAL_COMMUNITY)
Admission: RE | Disposition: A | Discharge status: Home / Self Care | Payer: Self-pay | Source: Home / Self Care | Attending: Orthopedic Surgery

## 2019-05-16 DIAGNOSIS — I252 Old myocardial infarction: Secondary | ICD-10-CM | POA: Diagnosis not present

## 2019-05-16 DIAGNOSIS — Z9889 Other specified postprocedural states: Secondary | ICD-10-CM

## 2019-05-16 DIAGNOSIS — S43431A Superior glenoid labrum lesion of right shoulder, initial encounter: Secondary | ICD-10-CM | POA: Diagnosis not present

## 2019-05-16 DIAGNOSIS — G4733 Obstructive sleep apnea (adult) (pediatric): Secondary | ICD-10-CM | POA: Diagnosis not present

## 2019-05-16 DIAGNOSIS — M25811 Other specified joint disorders, right shoulder: Secondary | ICD-10-CM | POA: Diagnosis not present

## 2019-05-16 DIAGNOSIS — G8918 Other acute postprocedural pain: Secondary | ICD-10-CM | POA: Diagnosis not present

## 2019-05-16 DIAGNOSIS — Z79899 Other long term (current) drug therapy: Secondary | ICD-10-CM | POA: Diagnosis not present

## 2019-05-16 DIAGNOSIS — Z7984 Long term (current) use of oral hypoglycemic drugs: Secondary | ICD-10-CM | POA: Diagnosis not present

## 2019-05-16 DIAGNOSIS — Z20822 Contact with and (suspected) exposure to covid-19: Secondary | ICD-10-CM | POA: Insufficient documentation

## 2019-05-16 DIAGNOSIS — M7541 Impingement syndrome of right shoulder: Secondary | ICD-10-CM | POA: Diagnosis not present

## 2019-05-16 DIAGNOSIS — M1990 Unspecified osteoarthritis, unspecified site: Secondary | ICD-10-CM | POA: Diagnosis not present

## 2019-05-16 DIAGNOSIS — I2510 Atherosclerotic heart disease of native coronary artery without angina pectoris: Secondary | ICD-10-CM | POA: Diagnosis not present

## 2019-05-16 DIAGNOSIS — I509 Heart failure, unspecified: Secondary | ICD-10-CM | POA: Diagnosis not present

## 2019-05-16 DIAGNOSIS — I110 Hypertensive heart disease with heart failure: Secondary | ICD-10-CM | POA: Diagnosis not present

## 2019-05-16 DIAGNOSIS — E119 Type 2 diabetes mellitus without complications: Secondary | ICD-10-CM | POA: Insufficient documentation

## 2019-05-16 DIAGNOSIS — G4730 Sleep apnea, unspecified: Secondary | ICD-10-CM | POA: Diagnosis not present

## 2019-05-16 DIAGNOSIS — Z7982 Long term (current) use of aspirin: Secondary | ICD-10-CM | POA: Insufficient documentation

## 2019-05-16 DIAGNOSIS — M75101 Unspecified rotator cuff tear or rupture of right shoulder, not specified as traumatic: Secondary | ICD-10-CM | POA: Insufficient documentation

## 2019-05-16 DIAGNOSIS — K219 Gastro-esophageal reflux disease without esophagitis: Secondary | ICD-10-CM | POA: Diagnosis not present

## 2019-05-16 DIAGNOSIS — M75121 Complete rotator cuff tear or rupture of right shoulder, not specified as traumatic: Secondary | ICD-10-CM | POA: Diagnosis not present

## 2019-05-16 DIAGNOSIS — E1151 Type 2 diabetes mellitus with diabetic peripheral angiopathy without gangrene: Secondary | ICD-10-CM | POA: Diagnosis not present

## 2019-05-16 DIAGNOSIS — Z87891 Personal history of nicotine dependence: Secondary | ICD-10-CM | POA: Diagnosis not present

## 2019-05-16 DIAGNOSIS — Z95810 Presence of automatic (implantable) cardiac defibrillator: Secondary | ICD-10-CM | POA: Insufficient documentation

## 2019-05-16 DIAGNOSIS — I10 Essential (primary) hypertension: Secondary | ICD-10-CM | POA: Diagnosis not present

## 2019-05-16 HISTORY — DX: Anxiety disorder, unspecified: F41.9

## 2019-05-16 HISTORY — DX: Unspecified osteoarthritis, unspecified site: M19.90

## 2019-05-16 HISTORY — DX: Pneumonia, unspecified organism: J18.9

## 2019-05-16 HISTORY — DX: Gastro-esophageal reflux disease without esophagitis: K21.9

## 2019-05-16 HISTORY — DX: Impingement syndrome of right shoulder: M75.41

## 2019-05-16 HISTORY — PX: SHOULDER ARTHROSCOPY: SHX128

## 2019-05-16 HISTORY — DX: Gastric ulcer, unspecified as acute or chronic, without hemorrhage or perforation: K25.9

## 2019-05-16 HISTORY — DX: Presence of spectacles and contact lenses: Z97.3

## 2019-05-16 HISTORY — DX: Other specified joint disorders, right shoulder: M25.811

## 2019-05-16 LAB — POCT I-STAT, CHEM 8
BUN: 26 mg/dL — ABNORMAL HIGH (ref 8–23)
Calcium, Ion: 1.12 mmol/L — ABNORMAL LOW (ref 1.15–1.40)
Chloride: 105 mmol/L (ref 98–111)
Creatinine, Ser: 1 mg/dL (ref 0.61–1.24)
Glucose, Bld: 241 mg/dL — ABNORMAL HIGH (ref 70–99)
HCT: 43 % (ref 39.0–52.0)
Hemoglobin: 14.6 g/dL (ref 13.0–17.0)
Potassium: 4.3 mmol/L (ref 3.5–5.1)
Sodium: 138 mmol/L (ref 135–145)
TCO2: 24 mmol/L (ref 22–32)

## 2019-05-16 LAB — GLUCOSE, CAPILLARY
Glucose-Capillary: 246 mg/dL — ABNORMAL HIGH (ref 70–99)
Glucose-Capillary: 200 mg/dL — ABNORMAL HIGH (ref 70–99)
Glucose-Capillary: 167 mg/dL — ABNORMAL HIGH (ref 70–99)

## 2019-05-16 SURGERY — ARTHROSCOPY, SHOULDER
Anesthesia: Regional | Site: Shoulder | Laterality: Right

## 2019-05-16 MED ORDER — MIDAZOLAM HCL 2 MG/2ML IJ SOLN
INTRAMUSCULAR | Status: AC
  Filled 2019-05-16: qty 2

## 2019-05-16 MED ORDER — OXYCODONE HCL 5 MG/5ML PO SOLN: 5.0000 mg | Freq: Once | ORAL | Status: DC | PRN

## 2019-05-16 MED ORDER — PHENYLEPHRINE 40 MCG/ML (10ML) SYRINGE FOR IV PUSH (FOR BLOOD PRESSURE SUPPORT)
PREFILLED_SYRINGE | INTRAVENOUS | Status: DC | PRN
  Administered 2019-05-16: 80 ug via INTRAVENOUS

## 2019-05-16 MED ORDER — LIDOCAINE 2% (20 MG/ML) 5 ML SYRINGE
INTRAMUSCULAR | Status: DC | PRN
  Administered 2019-05-16: 40 mg via INTRAVENOUS

## 2019-05-16 MED ORDER — ALBUMIN HUMAN 5 % IV SOLN
INTRAVENOUS | Status: AC
  Administered 2019-05-16: 12.5 g via INTRAVENOUS
  Filled 2019-05-16: qty 250

## 2019-05-16 MED ORDER — ROCURONIUM BROMIDE 10 MG/ML (PF) SYRINGE
PREFILLED_SYRINGE | INTRAVENOUS | Status: DC | PRN
  Administered 2019-05-16: 60 mg via INTRAVENOUS

## 2019-05-16 MED ORDER — OXYCODONE-ACETAMINOPHEN 5-325 MG PO TABS: 1.0000 | ORAL_TABLET | ORAL | 0 refills | Status: DC | PRN

## 2019-05-16 MED ORDER — FENTANYL CITRATE (PF) 100 MCG/2ML IJ SOLN
INTRAMUSCULAR | Status: AC
  Administered 2019-05-16: 100 ug via INTRAVENOUS
  Filled 2019-05-16: qty 2

## 2019-05-16 MED ORDER — ONDANSETRON HCL 4 MG/2ML IJ SOLN: 4.0000 mg | Freq: Once | INTRAMUSCULAR | Status: DC | PRN

## 2019-05-16 MED ORDER — SODIUM CHLORIDE 0.9 % IR SOLN
Status: DC | PRN
  Administered 2019-05-16: 6000 mL

## 2019-05-16 MED ORDER — LACTATED RINGERS IV SOLN: INTRAVENOUS | Status: DC

## 2019-05-16 MED ORDER — ROCURONIUM BROMIDE 10 MG/ML (PF) SYRINGE
PREFILLED_SYRINGE | INTRAVENOUS | Status: AC
  Filled 2019-05-16: qty 10

## 2019-05-16 MED ORDER — FENTANYL CITRATE (PF) 100 MCG/2ML IJ SOLN: 100.0000 ug | Freq: Once | INTRAMUSCULAR | Status: AC

## 2019-05-16 MED ORDER — PROPOFOL 10 MG/ML IV BOLUS
INTRAVENOUS | Status: DC | PRN
  Administered 2019-05-16: 160 mg via INTRAVENOUS

## 2019-05-16 MED ORDER — MIDAZOLAM HCL 2 MG/2ML IJ SOLN
INTRAMUSCULAR | Status: AC
  Administered 2019-05-16: 2 mg via INTRAVENOUS
  Filled 2019-05-16: qty 2

## 2019-05-16 MED ORDER — PHENYLEPHRINE 40 MCG/ML (10ML) SYRINGE FOR IV PUSH (FOR BLOOD PRESSURE SUPPORT)
PREFILLED_SYRINGE | INTRAVENOUS | Status: AC
  Filled 2019-05-16: qty 10

## 2019-05-16 MED ORDER — FENTANYL CITRATE (PF) 250 MCG/5ML IJ SOLN
INTRAMUSCULAR | Status: AC
  Filled 2019-05-16: qty 5

## 2019-05-16 MED ORDER — PROPOFOL 10 MG/ML IV BOLUS
INTRAVENOUS | Status: AC
  Filled 2019-05-16: qty 20

## 2019-05-16 MED ORDER — ALBUMIN HUMAN 5 % IV SOLN: 12.5000 g | Freq: Once | INTRAVENOUS | Status: AC

## 2019-05-16 MED ORDER — CEFAZOLIN SODIUM-DEXTROSE 2-4 GM/100ML-% IV SOLN
2.0000 g | INTRAVENOUS | Status: AC
  Administered 2019-05-16: 2 g via INTRAVENOUS
  Filled 2019-05-16: qty 100

## 2019-05-16 MED ORDER — SUGAMMADEX SODIUM 200 MG/2ML IV SOLN
INTRAVENOUS | Status: DC | PRN
  Administered 2019-05-16: 200 mg via INTRAVENOUS

## 2019-05-16 MED ORDER — FENTANYL CITRATE (PF) 100 MCG/2ML IJ SOLN: 25.0000 ug | INTRAMUSCULAR | Status: DC | PRN

## 2019-05-16 MED ORDER — LIDOCAINE 2% (20 MG/ML) 5 ML SYRINGE
INTRAMUSCULAR | Status: AC
  Filled 2019-05-16: qty 5

## 2019-05-16 MED ORDER — OXYCODONE HCL 5 MG PO TABS: 5.0000 mg | ORAL_TABLET | Freq: Once | ORAL | Status: DC | PRN

## 2019-05-16 MED ORDER — PHENYLEPHRINE HCL-NACL 10-0.9 MG/250ML-% IV SOLN
INTRAVENOUS | Status: DC | PRN
  Administered 2019-05-16: 30 ug/min via INTRAVENOUS

## 2019-05-16 MED ORDER — CHLORHEXIDINE GLUCONATE 4 % EX LIQD: 60.0000 mL | Freq: Once | CUTANEOUS | Status: DC

## 2019-05-16 MED ORDER — MIDAZOLAM HCL 2 MG/2ML IJ SOLN: 2.0000 mg | Freq: Once | INTRAMUSCULAR | Status: AC

## 2019-05-16 MED ORDER — ONDANSETRON HCL 4 MG/2ML IJ SOLN
INTRAMUSCULAR | Status: DC | PRN
  Administered 2019-05-16: 4 mg via INTRAVENOUS

## 2019-05-16 SURGICAL SUPPLY — 24 items
COVER SURGICAL LIGHT HANDLE (MISCELLANEOUS) ×6 IMPLANT
DRAPE STERI 35X30 U-POUCH (DRAPES) ×3 IMPLANT
DRAPE U-SHAPE 47X51 STRL (DRAPES) ×3 IMPLANT
DRSG EMULSION OIL 3X3 NADH (GAUZE/BANDAGES/DRESSINGS) ×3 IMPLANT
DRSG PAD ABDOMINAL 8X10 ST (GAUZE/BANDAGES/DRESSINGS) ×6 IMPLANT
DURAPREP 26ML APPLICATOR (WOUND CARE) ×3 IMPLANT
GAUZE SPONGE 4X4 12PLY STRL (GAUZE/BANDAGES/DRESSINGS) ×3 IMPLANT
GLOVE BIOGEL PI IND STRL 9 (GLOVE) ×1 IMPLANT
GLOVE BIOGEL PI INDICATOR 9 (GLOVE) ×2
GLOVE SURG ORTHO 9.0 STRL STRW (GLOVE) ×3 IMPLANT
GOWN STRL REUS W/ TWL XL LVL3 (GOWN DISPOSABLE) ×2 IMPLANT
GOWN STRL REUS W/TWL XL LVL3 (GOWN DISPOSABLE) ×4
KIT BASIN OR (CUSTOM PROCEDURE TRAY) ×3 IMPLANT
KIT TURNOVER KIT B (KITS) ×3 IMPLANT
MANIFOLD NEPTUNE II (INSTRUMENTS) ×3 IMPLANT
NEEDLE SPNL 18GX3.5 QUINCKE PK (NEEDLE) ×3 IMPLANT
NS IRRIG 1000ML POUR BTL (IV SOLUTION) ×3 IMPLANT
PACK SHOULDER (CUSTOM PROCEDURE TRAY) ×3 IMPLANT
PAD ARMBOARD 7.5X6 YLW CONV (MISCELLANEOUS) ×6 IMPLANT
PROBE APOLLO 90XL (SURGICAL WAND) ×3 IMPLANT
SLING ARM IMMOBILIZER XL (CAST SUPPLIES) ×3 IMPLANT
SUT ETHILON 2 0 FS 18 (SUTURE) ×3 IMPLANT
TOWEL GREEN STERILE FF (TOWEL DISPOSABLE) ×6 IMPLANT
TUBING ARTHROSCOPY IRRIG 16FT (MISCELLANEOUS) ×3 IMPLANT

## 2019-05-16 NOTE — Op Note (Signed)
05/16/2019  1:42 PM  PATIENT:  Jon Jones    PRE-OPERATIVE DIAGNOSIS:  Right Shoulder Impingement With rotator cuff tear  POST-OPERATIVE DIAGNOSIS: Right shoulder impingement, rotator cuff tear, biceps tendon tear, SLAP lesion.  PROCEDURE:  Right Shoulder Arthroscopy  SURGEON:  [name], MD  PHYSICIAN ASSISTANT:None ANESTHESIA:   General  PREOPERATIVE INDICATIONS:  Jon Jones is a  64 y.o. male with a diagnosis of Right Shoulder Impingement who failed conservative measures and elected for surgical management.    The risks benefits and alternatives were discussed with the patient preoperatively including but not limited to the risks of infection, bleeding, nerve injury, cardiopulmonary complications, the need for revision surgery, among others, and the patient was willing to proceed.  OPERATIVE IMPLANTS: None  @ENCIMAGES @  OPERATIVE FINDINGS: Full-thickness rotator cuff tear with biceps tendon tear grade 1 SLAP lesion and hooked type III acromion with impingement  OPERATIVE PROCEDURE: Patient brought the operating room after undergoing interscalene block he then underwent a general anesthetic. After adequate levels anesthesia were obtained patient was placed in the beachchair position and the right upper extremity was prepped using DuraPrep draped into a sterile field. The scope was inserted from the posterior portal and the anterior portal was established an outside in technique with a spinal needle. Visualization showed approximately 10% of the biceps tendon remaining and frayed and torn a grade 1 SLAP lesion with degenerative changes of the labrum and a full-thickness rotator cuff tear. The electrical wand and shaver were used to debride the torn biceps tendon back to a stable margin. Grade 1 SLAP lesion was debrided as well as the rotator cuff tear also debrided the electrocautery was used for hemostasis. The instruments removed the scope was inserted from the posterior portal  subacromial space and a new lateral portal was established. Visualization patient has significant bursitis as well as a hooked type III acromion. Patient underwent bursectomy and the bur was used to perform a subacromial decompression with the electrocautery used for hemostasis. The instruments were removed portals closed using 2-0 nylon sterile dressing was applied patient was extubated taken the PACU in stable condition.   DISCHARGE PLANNING:  Antibiotic duration: Preoperative antibiotics  Weightbearing: Not applicable  Pain medication: Percocet  Dressing care/ Wound VAC: Discontinue dressing in 2 days  Ambulatory devices: Not applicable  Discharge to: Home.  Follow-up: In the office 1 week post operative.

## 2019-05-16 NOTE — Transfer of Care (Signed)
Immediate Anesthesia Transfer of Care Note  [name]  Procedure(s) Performed: Right Shoulder Arthroscopy (Right Shoulder)  Patient Location: PACU  Anesthesia Type:General and Regional  Level of Consciousness: awake, alert , oriented and patient cooperative  Airway & Oxygen Therapy: Patient Spontanous Breathing and Patient connected to nasal cannula oxygen  Post-op Assessment: Report given to RN and Post -op Vital signs reviewed and stable  Post vital signs: Reviewed and stable  Last Vitals:  Vitals Value Taken Time  BP 105/64 05/16/19 1510  Temp 36.6 C 05/16/19 1510  Pulse 61 05/16/19 1516  Resp 16 05/16/19 1516  SpO2 95 % 05/16/19 1516  Vitals shown include unvalidated device data.  Last Pain:  Vitals:   05/16/19 1510  TempSrc:   PainSc: 0-No pain         Complications: No apparent anesthesia complications

## 2019-05-16 NOTE — H&P (Signed)
Jettson Crable is an 64 y.o. male.   Chief Complaint: Right Shoulder impingement HPI:  This is a pleasant 64 year old gentleman who comes in for 3 issues.  He is 2 months status post left fifth toe amputation with regards to this he is doing well and does not have any pain in his left foot  He is has also been followed for right shoulder pain and impingement symptoms.  He has been given injections which helped but are not long-lasting.  We discussed that if he did not continue to have improvement we could discuss shoulder arthroscopy he would like to now go forward with this if possible Past Medical History:  Diagnosis Date  . 5 years ago   . AICD (automatic cardioverter/defibrillator) present   . Anxiety   . Arthritis   . CHF (congestive heart failure) ([name])   . Coronary artery disease   . Diabetes mellitus without complication ([name])   . Dysrhythmia   . GERD (gastroesophageal reflux disease)    PMH  . Hypertension   . Peripheral vascular disease ([name])   . Peripheral vascular disease ([name])   . Pneumonia   . Shoulder impingement, right   . Sleep apnea    wears cpap  . Stomach ulcer   . Wears glasses     Past Surgical History:  Procedure Laterality Date  . AMPUTATION Left 03/12/2019   Procedure: 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;  Surgeon: [name], MD;  Location: [facility];  Service: Orthopedics;  Laterality: Left;  . CARDIAC CATHETERIZATION    . CARDIAC DEFIBRILLATOR PLACEMENT    . I & D EXTREMITY Left 07/19/2016   Procedure: IRRIGATION AND DEBRIDEMENT EXTREMITY/OLECRANON(WASHOUT);  Surgeon: Tarry Kos, MD;  Location: [facility] OR;  Service: Orthopedics;  Laterality: Left;  . OLECRANON BURSECTOMY Left 07/19/2016   Procedure: LEFT ELBOW OLECRANON BURSECTOMY;  Surgeon: Tarry Kos, MD;  Location: [facility];  Service: Orthopedics;  Laterality: Left;    Family History  Problem Relation Age of Onset  . Heart disease Mother   . Diabetes Mother   . Peripheral vascular disease  Mother    Social History:  reports that he quit smoking about 2 months ago. His smoking use included cigarettes. He has a 40.00 pack-year smoking history. He has never used smokeless tobacco. He reports previous alcohol use. He reports current drug use. Drug: Oxycodone.  Allergies: No Known Allergies  No medications prior to admission.    Results for orders placed or performed during the hospital encounter of 05/14/19 (from the past 48 hour(s))  SARS CORONAVIRUS 2 (TAT 6-24 HRS) Nasopharyngeal Nasopharyngeal Swab     Status: None   Collection Time: 05/14/19 11:36 AM   Specimen: Nasopharyngeal Swab  Result Value Ref Range   SARS Coronavirus 2 NEGATIVE NEGATIVE    Comment: (NOTE) SARS-CoV-2 target nucleic acids are NOT DETECTED. The SARS-CoV-2 RNA is generally detectable in upper and lower respiratory specimens during the acute phase of infection. Negative results do not preclude SARS-CoV-2 infection, do not rule out co-infections with other pathogens, and should not be used as the sole basis for treatment or other patient management decisions. Negative results must be combined with clinical observations, patient history, and epidemiological information. The expected result is Negative. Fact Sheet for Patients: HairSlick.no Fact Sheet for Healthcare Providers: quierodirigir.com This test is not yet approved or cleared by the [place] FDA and  has been authorized for detection and/or diagnosis of SARS-CoV-2 by FDA under an Emergency Use Authorization (  EUA). This EUA will remain  in effect (meaning this test can be used) for the duration of the COVID-19 declaration under Section 56 4(b)(1) of the Act, 21 U.S.C. section 360bbb-3(b)(1), unless the authorization is terminated or revoked sooner. Performed at [facility], [place] [address] [place], [place] [address]    No results found.  Review of Systems  All other  systems reviewed and are negative.   There were no vitals taken for this visit. Physical Exam  Patient is alert, oriented, no adenopathy, well-dressed, normal affect, normal respiratory effort. Focused examination of his left foot demonstrates well-healed amputation stump.  No swelling no fluctuance skin is in excellent condition no cellulitis  Right shoulder.  Abduction past 90 is quite painful.  Difficulty with internal rotation and forward elevation.  Focused and shoulder joint.  Positive impingement findings. Lungs Clear heart regular Rate and Rhythm Assessment/Plan Plan: He may follow-up as needed for his left foot.  He is having significant pain at night trying to sleep with his right shoulder.  He takes 1 oxycodone at night.  I will give him a refill but he understands that this may cause more difficulties with pain control after his shoulder surgery.  But regarding his knees we did go forward and inject his knees today.  Given the advanced nature of his arthritis we could try viscosupplementation though I have some speculation on how much that would help him.  Ultimately he may need knee replacements I will schedule his right shoulder arthroscopy.  Because of his sleep apnea this will be done at the operating room I reviewed the risks of his shoulder surgery which include bleeding infection anesthesia complications I also reviewed surgical outcomes and he understands that this may not relieve all of his pain.  We also reviewed the recovery  [place], PA 05/16/2019, 6:51 AM

## 2019-05-16 NOTE — Anesthesia Procedure Notes (Signed)
Anesthesia Regional Block: Interscalene brachial plexus block   Pre-Anesthetic Checklist: ,, timeout performed, Correct Patient, Correct Site, Correct Laterality, Correct Procedure, Correct Position, site marked, Risks and benefits discussed,  Surgical consent,  Pre-op evaluation,  At surgeon's request and post-op pain management  Laterality: Right  Prep: chloraprep       Needles:  Injection technique: Single-shot  Needle Type: Stimulator Needle - 40      Needle Gauge: 22     Additional Needles:   Procedures:, nerve stimulator,,,,,,,  Narrative:  Start time: 05/16/2019 11:20 AM End time: 05/16/2019 11:25 AM Injection made incrementally with aspirations every 5 mL.  Performed by: Personally  Anesthesiologist: Kipp Brood, MD  Additional Notes: 20 cc 0.5% Bupivacaine 1:200 epi 10 cc 1.3% exparel

## 2019-05-16 NOTE — Anesthesia Preprocedure Evaluation (Addendum)
Anesthesia Evaluation  Patient identified by MRN, date of birth, ID band Patient awake    Reviewed: Allergy & Precautions, NPO status , Patient's Chart, lab work & pertinent test results  Airway Mallampati: II  TM Distance: >3 FB Neck ROM: Full    Dental  (+) Teeth Intact, Dental Advisory Given   Pulmonary former smoker,    breath sounds clear to auscultation       Cardiovascular hypertension,  Rhythm:Regular Rate:Normal     Neuro/Psych    GI/Hepatic   Endo/Other  diabetes  Renal/GU      Musculoskeletal   Abdominal   Peds  Hematology   Anesthesia Other Findings   Reproductive/Obstetrics                             Anesthesia Physical Anesthesia Plan  ASA: III  Anesthesia Plan: General   Post-op Pain Management:  Regional for Post-op pain   Induction: Intravenous  PONV Risk Score and Plan: Ondansetron  Airway Management Planned: Oral ETT  Additional Equipment:   Intra-op Plan:   Post-operative Plan: Extubation in OR  Informed Consent: I have reviewed the patients History and Physical, chart, labs and discussed the procedure including the risks, benefits and alternatives for the proposed anesthesia with the patient or authorized representative who has indicated his/her understanding and acceptance.     Dental advisory given  Plan Discussed with: CRNA and Anesthesiologist  Anesthesia Plan Comments:         Anesthesia Quick Evaluation

## 2019-05-16 NOTE — Anesthesia Procedure Notes (Signed)
Procedure Name: Intubation Date/Time: 05/16/2019 12:42 PM Performed by: Moshe Salisbury, CRNA Pre-anesthesia Checklist: Patient identified, Emergency Drugs available, Suction available and Patient being monitored Patient Re-evaluated:Patient Re-evaluated prior to induction Oxygen Delivery Method: Circle System Utilized Preoxygenation: Pre-oxygenation with 100% oxygen Induction Type: IV induction Ventilation: Mask ventilation without difficulty Laryngoscope Size: Mac and 4 Grade View: Grade I Tube type: Oral Number of attempts: 1 Airway Equipment and Method: Stylet and Oral airway Placement Confirmation: ETT inserted through vocal cords under direct vision,  positive ETCO2 and breath sounds checked- equal and bilateral Secured at: 21 cm Tube secured with: Tape Dental Injury: Teeth and Oropharynx as per pre-operative assessment

## 2019-05-19 NOTE — Anesthesia Postprocedure Evaluation (Signed)
Anesthesia Post Note  [name]  Procedure(s) Performed: Right Shoulder Arthroscopy (Right Shoulder)     Patient location during evaluation: PACU Anesthesia Type: Regional Level of consciousness: awake and alert Pain management: pain level controlled Vital Signs Assessment: post-procedure vital signs reviewed and stable Respiratory status: spontaneous breathing, nonlabored ventilation, respiratory function stable and patient connected to nasal cannula oxygen Cardiovascular status: blood pressure returned to baseline and stable Postop Assessment: no apparent nausea or vomiting Anesthetic complications: no    Last Vitals:  Vitals:   05/16/19 1455 05/16/19 1510  BP: 97/62 105/64  Pulse: 61 61  Resp: 15 16  Temp:  36.6 C  SpO2: 94% 95%    Last Pain:  Vitals:   05/16/19 1510  TempSrc:   PainSc: 0-No pain                 Arriah Wadle COKER

## 2019-05-23 ENCOUNTER — Other Ambulatory Visit: Payer: Self-pay

## 2019-05-23 ENCOUNTER — Encounter: Payer: Self-pay | Admitting: Physician Assistant

## 2019-05-23 ENCOUNTER — Ambulatory Visit (INDEPENDENT_AMBULATORY_CARE_PROVIDER_SITE_OTHER): Payer: Medicaid Other | Admitting: Physician Assistant

## 2019-05-23 VITALS — Ht 66.0 in | Wt 233.0 lb

## 2019-05-23 DIAGNOSIS — M75121 Complete rotator cuff tear or rupture of right shoulder, not specified as traumatic: Secondary | ICD-10-CM

## 2019-05-23 NOTE — Progress Notes (Signed)
Office Visit Note   [name]           Date of Birth: 1955-10-13           MRN: [id number] Visit Date: 05/23/2019              Requested by: No referring provider defined for this encounter. PCP: System, Provider Not In  Chief Complaint  Patient presents with  . Right Shoulder - Routine Post Op    05/16/19 right shoulder scope      HPI: This is a pleasant 64 year old gentleman who is 1 week status post right shoulder arthroscopy debridement and manipulation. Overall he is doing well. He does have quite a bit of bruising but he is on blood thinners. He is also complaining of a pruritic rash that is on his right side in his armpit. It is quite itchy. It also was on the top of his right arm but this seems to have improved he does not have any rash on any other part of his body  Assessment & Plan: Visit Diagnoses: No diagnosis found.  Plan: Patient is doing well. I did instruct him in passive range of motion exercises which she should do is much as possible. We will remove his sutures in 1 week. I think he may benefit from physical therapy at that time. With regards to the rash most likely from prep given where this is. I have advised him to take Benadryl for the itching. We also discussed taking ibuprofen which he would prefer over the pain medication. I did try a very small amount of hydrocortisone cream on part of the rash to see if this improves. He can buy this over-the-counter as needed for the itching. I also encouraged him to keep washing with mild soap and water  Follow-Up Instructions: No follow-ups on file.   Ortho Exam  Patient is alert, oriented, no adenopathy, well-dressed, normal affect, normal respiratory effort. Focused examination of his right shoulder demonstrates healing surgical incisions. Distal CMS is intact. He does have quite a bit of ecchymosis on his shoulder running down into his bicep. He does have a mildly erythematous dry macular rash that is  slightly raised. It is quite pruritic. Also it is small amount resolving on his forearm. Findings most likely consistent with a contact dermatitis. There is no surrounding cellulitis in these areas and no drainage  Imaging: No results found. No images are attached to the encounter.  Labs: Lab Results  Component Value Date   HGBA1C 7.6 (H) 03/11/2019   ESRSEDRATE 11 03/11/2019   CRP 1.8 (H) 03/11/2019   REPTSTATUS 07/24/2016 FINAL 07/19/2016   GRAMSTAIN  07/19/2016    RARE WBC PRESENT, PREDOMINANTLY MONONUCLEAR NO ORGANISMS SEEN    CULT NO GROWTH 5 DAYS 07/19/2016     Lab Results  Component Value Date   ALBUMIN 3.7 03/11/2019    No results found for: MG No results found for: VD25OH  No results found for: PREALBUMIN CBC EXTENDED Latest Ref Rng & Units 05/16/2019 03/13/2019 03/12/2019  WBC 4.0 - 10.5 K/uL - 9.5 9.2  RBC 4.22 - 5.81 MIL/uL - 4.43 4.58  HGB 13.0 - 17.0 g/dL 40.9 81.1 91.4  HCT 78.2 - 52.0 % 43.0 42.4 44.0  PLT 150 - 400 K/uL - 197 198  NEUTROABS 1.7 - 7.7 K/uL - - -  LYMPHSABS 0.7 - 4.0 K/uL - - -     Body mass index is 37.61 kg/m.  Orders:  No orders  of the defined types were placed in this encounter.  No orders of the defined types were placed in this encounter.    Procedures: No procedures performed  Clinical Data: No additional findings.  ROS:  All other systems negative, except as noted in the HPI. Review of Systems  Objective: Vital Signs: Ht 5\' 6"  (1.676 m)   Wt 233 lb (105.7 kg)   BMI 37.61 kg/m   Specialty Comments:  No specialty comments available.  PMFS History: Patient Active Problem List   Diagnosis Date Noted  . Impingement syndrome of right shoulder   . Nontraumatic complete tear of right rotator cuff   . Osteomyelitis ([place]) 03/11/2019  . Osteomyelitis of fifth toe of left foot ([place])   . Type 2 diabetes mellitus with foot ulcer, without long-term current use of insulin ([place])   . 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 S/P debridement 07/19/2016  . Acute gout of left elbow 07/19/2016   Past Medical History:  Diagnosis Date  . 5 years ago   . AICD (automatic cardioverter/defibrillator) present   . Anxiety   . Arthritis   . CHF (congestive heart failure) ([place])   . Coronary artery disease   . Diabetes mellitus without complication ([place])   . Dysrhythmia   . GERD (gastroesophageal reflux disease)    PMH  . Hypertension   . Peripheral vascular disease ([place])   . Peripheral vascular disease ([place])   . Pneumonia   . Shoulder impingement, right   . Sleep apnea    wears cpap  . Stomach ulcer   . Wears glasses     Family History  Problem Relation Age of Onset  . Heart disease Mother   . Diabetes Mother   . Peripheral vascular disease Mother     Past Surgical History:  Procedure Laterality Date  . AMPUTATION Left 03/12/2019   Procedure: 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;  Surgeon: [name], MD;  Location: [place];  Service: Orthopedics;  Laterality: Left;  . CARDIAC CATHETERIZATION    . CARDIAC DEFIBRILLATOR PLACEMENT    . I & D EXTREMITY Left 07/19/2016   Procedure: IRRIGATION AND DEBRIDEMENT EXTREMITY/OLECRANON(WASHOUT);  Surgeon: Leandrew Koyanagi, MD;  Location: [place];  Service: Orthopedics;  Laterality: Left;  . OLECRANON BURSECTOMY Left 07/19/2016   Procedure: LEFT ELBOW OLECRANON BURSECTOMY;  Surgeon: Leandrew Koyanagi, MD;  Location: [place];  Service: Orthopedics;  Laterality: Left;  . SHOULDER ARTHROSCOPY Right 05/16/2019   Procedure: Right Shoulder Arthroscopy;  Surgeon: [name], MD;  Location: [facility];  Service: Orthopedics;  Laterality: Right;   Social History   Occupational History  . Not on file  Tobacco Use  . Smoking status: Former Smoker    Packs/day: 1.00    Years: 40.00    Pack years: 40.00    Types: Cigarettes    Quit date: 03/11/2019    Years since quitting: 0.2  . Smokeless tobacco: Never Used  Substance and Sexual Activity  .  Alcohol use: Not Currently  . Drug use: Yes    Types: Oxycodone  . Sexual activity: Not on file

## 2019-05-26 ENCOUNTER — Encounter: Payer: Self-pay | Admitting: *Deleted

## 2019-05-29 ENCOUNTER — Other Ambulatory Visit: Payer: Self-pay

## 2019-05-29 ENCOUNTER — Ambulatory Visit (INDEPENDENT_AMBULATORY_CARE_PROVIDER_SITE_OTHER): Payer: Medicaid Other | Admitting: Physician Assistant

## 2019-05-29 ENCOUNTER — Encounter: Payer: Self-pay | Admitting: Physician Assistant

## 2019-05-29 VITALS — Ht 66.0 in | Wt 233.0 lb

## 2019-05-29 DIAGNOSIS — M25511 Pain in right shoulder: Secondary | ICD-10-CM

## 2019-05-29 NOTE — Progress Notes (Signed)
Office Visit Note   [name]           Date of Birth: [birth year]           MRN: [id number] Visit Date: 05/29/2019              Requested by: No referring provider defined for this encounter. PCP: System, Provider Not In  Chief Complaint  Patient presents with  . Right Shoulder - Routine Post Op    05/16/19 right shoulder scope       HPI: This is a pleasant gentleman who is now 2 weeks status post right shoulder arthroscopy with debridement.  He reports he is feeling better than prior to surgery.  He is working on range of motion.  Assessment & Plan: Visit Diagnoses: No diagnosis found.  Plan: Patient will follow up in 4 weeks.  I emphasized the importance of working on range of motion and keeping good range of motion.  He is in agreement with this plan  Follow-Up Instructions: No follow-ups on file.   Ortho Exam  Patient is alert, oriented, no adenopathy, well-dressed, normal affect, normal respiratory effort. Focused examination demonstrates well-healed surgical portals sutures were harvested today.  He has forward elevation to 160 degrees he has internal rotation behind his back to his beltline.  Distal CMS is intact no erythema no cellulitis  Imaging: No results found. No images are attached to the encounter.  Labs: Lab Results  Component Value Date   HGBA1C 7.6 (H) 03/11/2019   ESRSEDRATE 11 03/11/2019   CRP 1.8 (H) 03/11/2019   REPTSTATUS 07/24/2016 FINAL 07/19/2016   GRAMSTAIN  07/19/2016    RARE WBC PRESENT, PREDOMINANTLY MONONUCLEAR NO ORGANISMS SEEN    CULT NO GROWTH 5 DAYS 07/19/2016     Lab Results  Component Value Date   ALBUMIN 3.7 03/11/2019    No results found for: MG No results found for: VD25OH  No results found for: PREALBUMIN CBC EXTENDED Latest Ref Rng & Units 05/16/2019 03/13/2019 03/12/2019  WBC 4.0 - 10.5 K/uL - 9.5 9.2  RBC 4.22 - 5.81 MIL/uL - 4.43 4.58  HGB 13.0 - 17.0 g/dL 14.6 13.7 14.2  HCT 39.0 - 52.0 % 43.0 42.4  44.0  PLT 150 - 400 K/uL - 197 198  NEUTROABS 1.7 - 7.7 K/uL - - -  LYMPHSABS 0.7 - 4.0 K/uL - - -     Body mass index is 37.61 kg/m.  Orders:  No orders of the defined types were placed in this encounter.  No orders of the defined types were placed in this encounter.    Procedures: No procedures performed  Clinical Data: No additional findings.  ROS:  All other systems negative, except as noted in the HPI. Review of Systems  Objective: Vital Signs: Ht 5\' 6"  (1.676 m)   Wt 233 lb (105.7 kg)   BMI 37.61 kg/m   Specialty Comments:  No specialty comments available.  PMFS History: Patient Active Problem List   Diagnosis Date Noted  . Impingement syndrome of right shoulder   . Nontraumatic complete tear of right rotator cuff   . Osteomyelitis ([place]) 03/11/2019  . Osteomyelitis of fifth toe of left foot ([place])   . Type 2 diabetes mellitus with foot ulcer, without long-term current use of insulin ([place])   . 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 S/P debridement 07/19/2016  . Acute gout of left elbow 07/19/2016   Past Medical History:  Diagnosis Date  .  5 years ago   . AICD (automatic cardioverter/defibrillator) present   . Anxiety   . Arthritis   . CHF (congestive heart failure) ([name])   . Coronary artery disease   . Diabetes mellitus without complication ([name])   . Dysrhythmia   . GERD (gastroesophageal reflux disease)    PMH  . Hypertension   . Peripheral vascular disease ([name])   . Peripheral vascular disease ([name])   . Pneumonia   . Shoulder impingement, right   . Sleep apnea    wears cpap  . Stomach ulcer   . Wears glasses     Family History  Problem Relation Age of Onset  . Heart disease Mother   . Diabetes Mother   . Peripheral vascular disease Mother     Past Surgical History:  Procedure Laterality Date  . AMPUTATION Left 03/12/2019   Procedure: 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;  Surgeon: [name], MD;  [facility];   Service: Orthopedics;  Laterality: Left;  . CARDIAC CATHETERIZATION    . CARDIAC DEFIBRILLATOR PLACEMENT    . I & D EXTREMITY Left 07/19/2016   Procedure: IRRIGATION AND DEBRIDEMENT EXTREMITY/OLECRANON(WASHOUT);  Surgeon: Tarry Kos, MD;  Location: [facility] OR;  Service: Orthopedics;  Laterality: Left;  . OLECRANON BURSECTOMY Left 07/19/2016   Procedure: LEFT ELBOW OLECRANON BURSECTOMY;  Surgeon: Tarry Kos, MD;  Location: [facility];  Service: Orthopedics;  Laterality: Left;  . SHOULDER ARTHROSCOPY Right 05/16/2019   Procedure: Right Shoulder Arthroscopy;  Surgeon: [name], MD;  Location: [facility];  Service: Orthopedics;  Laterality: Right;   Social History   Occupational History  . Not on file  Tobacco Use  . Smoking status: Former Smoker    Packs/day: 1.00    Years: 40.00    Pack years: 40.00    Types: Cigarettes    Quit date: 03/11/2019    Years since quitting: 0.2  . Smokeless tobacco: Never Used  Substance and Sexual Activity  . Alcohol use: Not Currently  . Drug use: Yes    Types: Oxycodone  . Sexual activity: Not on file

## 2019-05-30 ENCOUNTER — Ambulatory Visit: Payer: Medicaid Other | Admitting: Physician Assistant

## 2019-05-30 DIAGNOSIS — Z23 Encounter for immunization: Secondary | ICD-10-CM | POA: Diagnosis not present

## 2019-06-03 DIAGNOSIS — Z95810 Presence of automatic (implantable) cardiac defibrillator: Secondary | ICD-10-CM | POA: Diagnosis not present

## 2019-06-03 DIAGNOSIS — Z4502 Encounter for adjustment and management of automatic implantable cardiac defibrillator: Secondary | ICD-10-CM | POA: Diagnosis not present

## 2019-06-16 DIAGNOSIS — G4733 Obstructive sleep apnea (adult) (pediatric): Secondary | ICD-10-CM | POA: Diagnosis not present

## 2019-06-21 DIAGNOSIS — Z23 Encounter for immunization: Secondary | ICD-10-CM | POA: Diagnosis not present

## 2019-06-26 ENCOUNTER — Ambulatory Visit (INDEPENDENT_AMBULATORY_CARE_PROVIDER_SITE_OTHER): Payer: Medicaid Other | Admitting: Physician Assistant

## 2019-06-26 ENCOUNTER — Other Ambulatory Visit: Payer: Self-pay

## 2019-06-26 ENCOUNTER — Encounter: Payer: Self-pay | Admitting: Physician Assistant

## 2019-06-26 VITALS — Ht 66.0 in | Wt 233.0 lb

## 2019-06-26 DIAGNOSIS — M75121 Complete rotator cuff tear or rupture of right shoulder, not specified as traumatic: Secondary | ICD-10-CM

## 2019-06-26 NOTE — Progress Notes (Signed)
Office Visit Note   [name]           Date of Birth: [birth year]           MRN: [id number] Visit Date: 06/26/2019              Requested by: No referring provider defined for this encounter. PCP: System, Provider Not In  Chief Complaint  Patient presents with  . Right Shoulder - Routine Post Op    05/16/19 right shoulder scope       HPI: This is a pleasant gentleman who is now 5 weeks status post right shoulder scope.  He is concerned because it has continued range of motion and is lacking some strength.  He is interested in exercises and physical therapy at this point.  Assessment & Plan: Visit Diagnoses:  1. Nontraumatic complete tear of right rotator cuff     Plan: He will follow up in 4 weeks.  We will contact him to arrange for physical therapy  Follow-Up Instructions: No follow-ups on file.   Ortho Exam  Patient is alert, oriented, no adenopathy, well-dressed, normal affect, normal respiratory effort. Focused exam demonstrates well-healed surgical portals.  He does have some decreased strength with resisted abduction difficulties with internal rotation and forward extension.  Distal CMS is intact no erythema or cellulitis Imaging: No results found. No images are attached to the encounter.  Labs: Lab Results  Component Value Date   HGBA1C 7.6 (H) 03/11/2019   ESRSEDRATE 11 03/11/2019   CRP 1.8 (H) 03/11/2019   REPTSTATUS 07/24/2016 FINAL 07/19/2016   GRAMSTAIN  07/19/2016    RARE WBC PRESENT, PREDOMINANTLY MONONUCLEAR NO ORGANISMS SEEN    CULT NO GROWTH 5 DAYS 07/19/2016     Lab Results  Component Value Date   ALBUMIN 3.7 03/11/2019    No results found for: MG No results found for: VD25OH  No results found for: PREALBUMIN CBC EXTENDED Latest Ref Rng & Units 05/16/2019 03/13/2019 03/12/2019  WBC 4.0 - 10.5 K/uL - 9.5 9.2  RBC 4.22 - 5.81 MIL/uL - 4.43 4.58  HGB 13.0 - 17.0 g/dL 08.6 76.1 95.0  HCT 93.2 - 52.0 % 43.0 42.4 44.0  PLT 150 -  400 K/uL - 197 198  NEUTROABS 1.7 - 7.7 K/uL - - -  LYMPHSABS 0.7 - 4.0 K/uL - - -     Body mass index is 37.61 kg/m.  Orders:  Orders Placed This Encounter  Procedures  . Ambulatory referral to Physical Therapy   No orders of the defined types were placed in this encounter.    Procedures: No procedures performed  Clinical Data: No additional findings.  ROS:  All other systems negative, except as noted in the HPI. Review of Systems  Objective: Vital Signs: Ht 5\' 6"  (1.676 m)   Wt 233 lb (105.7 kg)   BMI 37.61 kg/m   Specialty Comments:  No specialty comments available.  PMFS History: Patient Active Problem List   Diagnosis Date Noted  . Impingement syndrome of right shoulder   . Nontraumatic complete tear of right rotator cuff   . Osteomyelitis ([name]) 03/11/2019  . Osteomyelitis of fifth toe of left foot ([name])   . Type 2 diabetes mellitus with foot ulcer, without long-term current use of insulin ([name])   . 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 S/P debridement 07/19/2016  . Acute gout of left elbow 07/19/2016   Past Medical History:  Diagnosis Date  .  5 years ago   . AICD (automatic cardioverter/defibrillator) present   . Anxiety   . Arthritis   . CHF (congestive heart failure) ([place])   . Coronary artery disease   . Diabetes mellitus without complication ([place])   . Dysrhythmia   . GERD (gastroesophageal reflux disease)    PMH  . Hypertension   . Peripheral vascular disease ([place])   . Peripheral vascular disease ([place])   . Pneumonia   . Shoulder impingement, right   . Sleep apnea    wears cpap  . Stomach ulcer   . Wears glasses     Family History  Problem Relation Age of Onset  . Heart disease Mother   . Diabetes Mother   . Peripheral vascular disease Mother     Past Surgical History:  Procedure Laterality Date  . AMPUTATION Left 03/12/2019   Procedure: 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;  Surgeon: [name], MD;  Location:  [place];  Service: Orthopedics;  Laterality: Left;  . CARDIAC CATHETERIZATION    . CARDIAC DEFIBRILLATOR PLACEMENT    . I & D EXTREMITY Left 07/19/2016   Procedure: IRRIGATION AND DEBRIDEMENT EXTREMITY/OLECRANON(WASHOUT);  Surgeon: Leandrew Koyanagi, MD;  Location: [place];  Service: Orthopedics;  Laterality: Left;  . OLECRANON BURSECTOMY Left 07/19/2016   Procedure: LEFT ELBOW OLECRANON BURSECTOMY;  Surgeon: Leandrew Koyanagi, MD;  Location: [place];  Service: Orthopedics;  Laterality: Left;  . SHOULDER ARTHROSCOPY Right 05/16/2019   Procedure: Right Shoulder Arthroscopy;  Surgeon: [name], MD;  Location: [place];  Service: Orthopedics;  Laterality: Right;   Social History   Occupational History  . Not on file  Tobacco Use  . Smoking status: Former Smoker    Packs/day: 1.00    Years: 40.00    Pack years: 40.00    Types: Cigarettes    Quit date: 03/11/2019    Years since quitting: 0.2  . Smokeless tobacco: Never Used  Substance and Sexual Activity  . Alcohol use: Not Currently  . Drug use: Yes    Types: Oxycodone  . Sexual activity: Not on file

## 2019-07-08 ENCOUNTER — Other Ambulatory Visit: Payer: Self-pay

## 2019-07-08 ENCOUNTER — Ambulatory Visit: Payer: Medicaid Other | Attending: Physician Assistant

## 2019-07-08 DIAGNOSIS — M25511 Pain in right shoulder: Secondary | ICD-10-CM | POA: Diagnosis not present

## 2019-07-08 DIAGNOSIS — M6281 Muscle weakness (generalized): Secondary | ICD-10-CM | POA: Diagnosis not present

## 2019-07-08 DIAGNOSIS — G8929 Other chronic pain: Secondary | ICD-10-CM

## 2019-07-09 NOTE — Therapy (Signed)
[facility] [place] [address] [place], [place], [address] Phone: [phone]   Fax:  [phone]  Physical Therapy Evaluation  Patient Details  Name: Jon Jones MRN: [id number] Date of Birth: [birth year] Referring Provider (PT): Devonne Doughty, [place]   Encounter Date: 07/08/2019  PT End of Session - 07/09/19 0554    Visit Number  1    Number of Visits  4    Date for PT Re-Evaluation  09/03/19    Authorization Type  MCD    Authorization - Visit Number  0    Authorization - Number of Visits  3    Progress Note Due on Visit  4    PT Start Time  1458    PT Stop Time  1542    PT Time Calculation (min)  44 min    Activity Tolerance  Patient tolerated treatment well    Behavior During Therapy  [facility] for tasks assessed/performed       Past Medical History:  Diagnosis Date  . 5 years ago   . AICD (automatic cardioverter/defibrillator) present   . Anxiety   . Arthritis   . CHF (congestive heart failure) ([name])   . Coronary artery disease   . Diabetes mellitus without complication ([name])   . Dysrhythmia   . GERD (gastroesophageal reflux disease)    PMH  . Hypertension   . Peripheral vascular disease ([name])   . Peripheral vascular disease ([name])   . Pneumonia   . Shoulder impingement, right   . Sleep apnea    wears cpap  . Stomach ulcer   . Wears glasses     Past Surgical History:  Procedure Laterality Date  . AMPUTATION Left 03/12/2019   Procedure: 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;  Surgeon: [name], MD;  Location: [facility] OR;  Service: Orthopedics;  Laterality: Left;  . CARDIAC CATHETERIZATION    . CARDIAC DEFIBRILLATOR PLACEMENT    . I & D EXTREMITY Left 07/19/2016   Procedure: IRRIGATION AND DEBRIDEMENT EXTREMITY/OLECRANON(WASHOUT);  Surgeon: Tarry Kos, MD;  Location: [facility] OR;  Service: Orthopedics;  Laterality: Left;  . OLECRANON BURSECTOMY Left 07/19/2016   Procedure: LEFT ELBOW OLECRANON BURSECTOMY;  Surgeon: Tarry Kos, MD;   Location: [facility];  Service: Orthopedics;  Laterality: Left;  . SHOULDER ARTHROSCOPY Right 05/16/2019   Procedure: Right Shoulder Arthroscopy;  Surgeon: [name], MD;  Location: [facility] OR;  Service: Orthopedics;  Laterality: Right;    There were no vitals filed for this visit.   Subjective Assessment - 07/08/19 1454    Subjective  Pt reports he is having limited use of his R shoulder with pain during active arm movements. Pt reports no R shoulder pain at rest. Pt states he had R shoulder surgery the beginning of March. Pt states he injured his R shoulder during a fall when he was using a RW after amputation surgery of his L little toe.    Limitations  House hold activities;Other (comment)   Use of his R shoulder   How long can you sit comfortably?  No limitation    How long can you stand comfortably?  10 mins, cluadication of LEs    How long can you walk comfortably?  3 mins, claudication of LEs    Patient Stated Goals  To improve my use of my R arm and to have decreased pain    Currently in Pain?  Yes    Pain Score  5  Pain Location  Shoulder    Pain Orientation  Right;Anterior    Pain Type  Chronic pain    Pain Onset  More than a month ago    Pain Frequency  Intermittent    Aggravating Factors   R arm movement, reaching forward and above    Pain Relieving Factors  Rest, tylenol    Effect of Pain on Daily Activities  Use of my R arm is very limited, and I have to use L more often    Multiple Pain Sites  No         [facility] PT Assessment - 07/09/19 0001      Assessment   Medical Diagnosis  Nontramatic complete tear r rotator cuff    Referring Provider (PT)  Shanon Rosser, PA    Onset Date/Surgical Date  05/16/19    Hand Dominance  Right      Precautions   Precautions  None      Restrictions   Weight Bearing Restrictions  No      Balance Screen   Has the patient fallen in the past 6 months  Yes    How many times?  1    Has the patient had a  decrease in activity level because of a fear of falling?   No    Is the patient reluctant to leave their home because of a fear of falling?   No      Home Environment   Living Environment  Private residence    Living Arrangements  Spouse/significant other;Children    Type of [place] to enter    Entrance Stairs-Number of Steps  [place]  Two level    [place] - 2 wheels      Prior Function   Level of [place]  Retired      Associate Professor   Overall Cognitive Status  Within Functional Limits for tasks assessed      Observation/Other Assessments   Skin Integrity  Healed arthroscopic incisions      Sensation   Light Touch  Appears Intact      Coordination   Gross Motor Movements are Fluid and Coordinated  Yes      Posture/Postural Control   Posture/Postural Control  Postural limitations    Postural Limitations  Rounded Shoulders;Forward head;Increased thoracic kyphosis      ROM / Strength   AROM / PROM / Strength  AROM;PROM;Strength      AROM   AROM Assessment Site  Shoulder    Right/Left Shoulder  Right;Left    Right Shoulder Flexion  50 Degrees    Right Shoulder ABduction  75 Degrees    Right Shoulder External Rotation  -30 Degrees   from neutral in L SL   Left Shoulder Flexion  140 Degrees   Painfull arc   Left Shoulder ABduction  155 Degrees   painful arc   Left Shoulder External Rotation  55 Degrees      PROM   PROM Assessment Site  Shoulder    Right/Left Shoulder  Right    Right Shoulder Flexion  155 Degrees   min increase in pain at endrange   Right Shoulder ABduction  146 Degrees   Min pain at endrange   Right Shoulder External Rotation  50 Degrees      Strength   Strength  Assessment Site  Shoulder    Right/Left Shoulder  Right    Right Shoulder Flexion  4+/5   neutral   Right Shoulder Extension  4+/5   neutral   Right Shoulder ABduction  4+/5    neutral   Right Shoulder Internal Rotation  2/5   neutral     Special Tests    Special Tests  Rotator Cuff Impingement;Biceps/Labral Tests    Rotator Cuff Impingment tests  Leanord Asal test;Empty Can test;Full Can test    Biceps/Labral tests  Speeds Test      Hawkins-Kennedy test   Findings  Negative    Side  Right      Empty Can test   Findings  Positive    Side  Right      Full Can test   Findings  Positive    Side  Right      Speeds test   findings  Negative    Side  Right      Ambulation/Gait   Gait Comments  WFLs for step thru gait pattern                Objective measurements completed on examination: See above findings.              PT Education - 07/09/19 5809    Education Details  Reviewed eval finding, POC, and HEP.    Person(s) Educated  Patient    Methods  Demonstration;Tactile cues;Explanation;Verbal cues;Handout    Comprehension  Verbalized understanding;Returned demonstration;Verbal cues required;Tactile cues required;Need further instruction       PT Short Term Goals - 07/09/19 9833      PT SHORT TERM GOAL #1   Title  Pt will be Ind c an initial HEP    Baseline  no program    Time  3    Period  Weeks    Status  New    Target Date  07/30/19      PT SHORT TERM GOAL #2   Title  Pt will demonstrate R shoulder ER strength to neutral in L SL    Baseline  -30d less than neutral    Time  3    Period  Weeks    Status  New    Target Date  07/30/19        PT Long Term Goals - 07/09/19 1026      PT LONG TERM GOAL #1   Title  Improve R shoulder ER strength to 3/5 for improved functional use of the R UE    Baseline  2/5    Time  8    Period  Weeks    Status  New    Target Date  09/03/19      PT LONG TERM GOAL #2   Title  Improve AROM for R flex to 90d, abd to 100d in standing and ER to 10d in L SL  for improvedfunctional use of the R UE    Baseline  Flex 50d, abd 70d, ER -30    Time  8    Period  Weeks    Status   New    Target Date  09/03/19      PT LONG TERM GOAL #3   Title  Pt will report a decrease in R shoulder pain c forward and above head reaching with a range of 0-3/10.    Baseline  5    Time  8    Period  Weeks  Status  New    Target Date  09/03/19      PT LONG TERM GOAL #4   Title  Pt will be Ind a final HEP to inprove pt's R shoulder AROM, strength and functional use.    Baseline  No program    Time  8    Period  Weeks    Status  New    Target Date  09/03/19             Plan - 07/09/19 6546    Clinical Impression Statement  Pt presents with decreased AROM, strength, and functional use of the R UE associated c a R rotator cuff tear. Pt's R shoulder is pain free at rest in a neutral position, but is pain and weak with forward at and above shoulder height. MMT revealed weak R shoulder ER to -30 of neutral in L SL. Pt will benefit from PT to address R shoulder strength and AROM for improved functional use.    Personal Factors and Comorbidities  Comorbidity 3+    Comorbidities  CHF,anxiety, DM    Examination-Activity Limitations  Carry;Lift;Reach Overhead;Self Feeding    Stability/Clinical Decision Making  Evolving/Moderate complexity    Clinical Decision Making  Moderate    Rehab Potential  Fair    PT Frequency  2x / week    PT Duration  6 weeks    PT Treatment/Interventions  Moist Heat;Therapeutic activities;Therapeutic exercise;Manual techniques;Taping;Dry needling;Passive range of motion;Patient/family education;Electrical Stimulation;Iontophoresis 4mg /ml Dexamethasone    PT Next Visit Plan  Assess response to HEP. Continue rehab for strengthening and ROM    PT Home Exercise Plan  [id number]. Standing wand exs for R shoulder ER and flexion       Patient will benefit from skilled therapeutic intervention in order to improve the following deficits and impairments:  Postural dysfunction, Improper body mechanics, Decreased range of motion, Pain, Impaired UE functional use,  Decreased strength, Obesity  Visit Diagnosis: Muscle weakness (generalized) - Plan: PT plan of care cert/re-cert  Chronic pain in right shoulder - Plan: PT plan of care cert/re-cert     Problem List Patient Active Problem List   Diagnosis Date Noted  . Impingement syndrome of right shoulder   . Nontraumatic complete tear of right rotator cuff   . Osteomyelitis ([name]) 03/11/2019  . Osteomyelitis of fifth toe of left foot ([name])   . Type 2 diabetes mellitus with foot ulcer, without long-term current use of insulin ([name])   . 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 S/P debridement 07/19/2016  . Acute gout of left elbow 07/19/2016    09/18/2016 MS, PT 07/09/19 10:40 AM   [facility] [address] [place], [place], [place] Phone: [phone]   Fax:  [phone]  Name: Ethin Drummond MRN: Norberta Keens Date of Birth: 1956-01-23

## 2019-07-14 ENCOUNTER — Ambulatory Visit: Payer: Medicaid Other | Attending: Physician Assistant | Admitting: Physical Therapy

## 2019-07-14 ENCOUNTER — Encounter: Payer: Self-pay | Admitting: Physician Assistant

## 2019-07-14 ENCOUNTER — Ambulatory Visit (INDEPENDENT_AMBULATORY_CARE_PROVIDER_SITE_OTHER): Payer: Medicaid Other | Admitting: Orthopedic Surgery

## 2019-07-14 ENCOUNTER — Encounter: Payer: Self-pay | Admitting: Physical Therapy

## 2019-07-14 ENCOUNTER — Other Ambulatory Visit: Payer: Self-pay

## 2019-07-14 ENCOUNTER — Ambulatory Visit (INDEPENDENT_AMBULATORY_CARE_PROVIDER_SITE_OTHER): Payer: Medicaid Other

## 2019-07-14 VITALS — Ht 65.0 in | Wt 234.0 lb

## 2019-07-14 DIAGNOSIS — M5416 Radiculopathy, lumbar region: Secondary | ICD-10-CM

## 2019-07-14 DIAGNOSIS — M6281 Muscle weakness (generalized): Secondary | ICD-10-CM

## 2019-07-14 DIAGNOSIS — M25511 Pain in right shoulder: Secondary | ICD-10-CM | POA: Diagnosis not present

## 2019-07-14 DIAGNOSIS — M545 Low back pain, unspecified: Secondary | ICD-10-CM

## 2019-07-14 DIAGNOSIS — G8929 Other chronic pain: Secondary | ICD-10-CM | POA: Diagnosis not present

## 2019-07-14 DIAGNOSIS — M4316 Spondylolisthesis, lumbar region: Secondary | ICD-10-CM | POA: Diagnosis not present

## 2019-07-14 MED ORDER — PREDNISONE 10 MG PO TABS: 20.0000 mg | ORAL_TABLET | Freq: Every day | ORAL | 0 refills | Status: DC

## 2019-07-14 NOTE — Progress Notes (Signed)
Office Visit Note   [name]           Date of Birth: [birth year]           MRN: [id number] Visit Date: 07/14/2019              Requested by: No referring provider defined for this encounter. PCP: System, Provider Not In  Chief Complaint  Patient presents with  . Lower Back - Pain      HPI: Patient is a 64 year old gentleman who presents with acute left sided radicular symptoms from the buttocks down the lateral aspect of the left leg.  Patient states he has had symptoms for about 2 weeks.  Patient has recently had a fall and is wondering if this is related to his fall he has been using ibuprofen and oxycodone without relief.  Patient does have a defibrillator.  Assessment & Plan: Visit Diagnoses:  1. Acute left-sided low back pain, unspecified whether sciatica present   2. Spondylolisthesis, lumbar region   3. Lumbar back pain with radiculopathy affecting left lower extremity     Plan: We will start with prednisone 20 mg with breakfast and wean off as he improves.  Discussed that if we did not get sufficient relief with the prednisone we would need to get a CT myelogram for evaluation for possible epidural steroid injection.  Follow-Up Instructions: Return in about 3 weeks (around 08/04/2019).   Ortho Exam  Patient is alert, oriented, no adenopathy, well-dressed, normal affect, normal respiratory effort. Examination patient sits leaning to the right side.  He has a negative straight leg raise on the left good motor strength in the left lower extremity pain is reproduced with hip flexion.  Imaging: XR Lumbar Spine 2-3 Views  Result Date: 07/14/2019 2 view radiographs of the lumbar spine shows advanced degenerative disc disease with a grade 1 spondylolisthesis at L5-S1 anterior chronic compression at T12-L1 and L2 with joint space narrowing anterior bony spurs and calcification of the aorta without aneurysm.  No images are attached to the encounter.  Labs: Lab  Results  Component Value Date   HGBA1C 7.6 (H) 03/11/2019   ESRSEDRATE 11 03/11/2019   CRP 1.8 (H) 03/11/2019   REPTSTATUS 07/24/2016 FINAL 07/19/2016   GRAMSTAIN  07/19/2016    RARE WBC PRESENT, PREDOMINANTLY MONONUCLEAR NO ORGANISMS SEEN    CULT NO GROWTH 5 DAYS 07/19/2016     Lab Results  Component Value Date   ALBUMIN 3.7 03/11/2019    No results found for: MG No results found for: VD25OH  No results found for: PREALBUMIN CBC EXTENDED Latest Ref Rng & Units 05/16/2019 03/13/2019 03/12/2019  WBC 4.0 - 10.5 K/uL - 9.5 9.2  RBC 4.22 - 5.81 MIL/uL - 4.43 4.58  HGB 13.0 - 17.0 g/dL 14.6 13.7 14.2  HCT 39.0 - 52.0 % 43.0 42.4 44.0  PLT 150 - 400 K/uL - 197 198  NEUTROABS 1.7 - 7.7 K/uL - - -  LYMPHSABS 0.7 - 4.0 K/uL - - -     Body mass index is 38.94 kg/m.  Orders:  Orders Placed This Encounter  Procedures  . XR Lumbar Spine 2-3 Views   Meds ordered this encounter  Medications  . predniSONE (DELTASONE) 10 MG tablet    Sig: Take 2 tablets (20 mg total) by mouth daily with breakfast.    Dispense:  60 tablet    Refill:  0     Procedures: No procedures performed  Clinical Data: No additional findings.  ROS:  All other systems negative, except as noted in the HPI. Review of Systems  Objective: Vital Signs: Ht 5\' 5"  (1.651 m)   Wt 234 lb (106.1 kg)   BMI 38.94 kg/m   Specialty Comments:  No specialty comments available.  PMFS History: Patient Active Problem List   Diagnosis Date Noted  . Impingement syndrome of right shoulder   . Nontraumatic complete tear of right rotator cuff   . Osteomyelitis ([name]) 03/11/2019  . Osteomyelitis of fifth toe of left foot ([name])   . Type 2 diabetes mellitus with foot ulcer, without long-term current use of insulin ([name])   . 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 S/P debridement 07/19/2016  . Acute gout of left elbow 07/19/2016   Past Medical History:  Diagnosis Date  . 5 years ago   .  AICD (automatic cardioverter/defibrillator) present   . Anxiety   . Arthritis   . CHF (congestive heart failure) ([name])   . Coronary artery disease   . Diabetes mellitus without complication ([name])   . Dysrhythmia   . GERD (gastroesophageal reflux disease)    PMH  . Hypertension   . Peripheral vascular disease ([name])   . Peripheral vascular disease ([name])   . Pneumonia   . Shoulder impingement, right   . Sleep apnea    wears cpap  . Stomach ulcer   . Wears glasses     Family History  Problem Relation Age of Onset  . Heart disease Mother   . Diabetes Mother   . Peripheral vascular disease Mother     Past Surgical History:  Procedure Laterality Date  . AMPUTATION Left 03/12/2019   Procedure: 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;  Surgeon: 03/14/2019, MD;  Location: [facility] OR;  Service: Orthopedics;  Laterality: Left;  . CARDIAC CATHETERIZATION    . CARDIAC DEFIBRILLATOR PLACEMENT    . I & D EXTREMITY Left 07/19/2016   Procedure: IRRIGATION AND DEBRIDEMENT EXTREMITY/OLECRANON(WASHOUT);  Surgeon: 09/18/2016, MD;  Location: [facility] OR;  Service: Orthopedics;  Laterality: Left;  . OLECRANON BURSECTOMY Left 07/19/2016   Procedure: LEFT ELBOW OLECRANON BURSECTOMY;  Surgeon: 09/18/2016, MD;  Location: [facility];  Service: Orthopedics;  Laterality: Left;  . SHOULDER ARTHROSCOPY Right 05/16/2019   Procedure: Right Shoulder Arthroscopy;  Surgeon: 07/16/2019, MD;  Location: [facility] OR;  Service: Orthopedics;  Laterality: Right;   Social History   Occupational History  . Not on file  Tobacco Use  . Smoking status: Former Smoker    Packs/day: 1.00    Years: 40.00    Pack years: 40.00    Types: Cigarettes    Quit date: 03/11/2019    Years since quitting: 0.3  . Smokeless tobacco: Never Used  Substance and Sexual Activity  . Alcohol use: Not Currently  . Drug use: Yes    Types: Oxycodone  . Sexual activity: Not on file

## 2019-07-14 NOTE — Therapy (Signed)
[facility] [address] [place], [place], [address] Phone: [phone]   Fax:  [phone]  Physical Therapy Treatment  Patient Details  Name: Laurance Heide MRN: [id number] Date of Birth: 11-24-1955 Referring Provider (PT): Devonne Doughty, [place]   Encounter Date: 07/14/2019  PT End of Session - 07/14/19 1215    Visit Number  2    Number of Visits  4    Date for PT Re-Evaluation  09/03/19    Authorization Type  MCD, 3 visits    Authorization Time Period  07/12/2019-07/25/19    Authorization - Visit Number  1    Authorization - Number of Visits  3    PT Start Time  1206    PT Stop Time  1246    PT Time Calculation (min)  40 min       Past Medical History:  Diagnosis Date  . 5 years ago   . AICD (automatic cardioverter/defibrillator) present   . Anxiety   . Arthritis   . CHF (congestive heart failure) ([name])   . Coronary artery disease   . Diabetes mellitus without complication ([name])   . Dysrhythmia   . GERD (gastroesophageal reflux disease)    PMH  . Hypertension   . Peripheral vascular disease ([name])   . Peripheral vascular disease ([name])   . Pneumonia   . Shoulder impingement, right   . Sleep apnea    wears cpap  . Stomach ulcer   . Wears glasses     Past Surgical History:  Procedure Laterality Date  . AMPUTATION Left 03/12/2019   Procedure: 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;  Surgeon: [name], MD;  Location: [facility] OR;  Service: Orthopedics;  Laterality: Left;  . CARDIAC CATHETERIZATION    . CARDIAC DEFIBRILLATOR PLACEMENT    . I & D EXTREMITY Left 07/19/2016   Procedure: IRRIGATION AND DEBRIDEMENT EXTREMITY/OLECRANON(WASHOUT);  Surgeon: Tarry Kos, MD;  Location: [facility] OR;  Service: Orthopedics;  Laterality: Left;  . OLECRANON BURSECTOMY Left 07/19/2016   Procedure: LEFT ELBOW OLECRANON BURSECTOMY;  Surgeon: Tarry Kos, MD;  Location: [facility];  Service: Orthopedics;  Laterality: Left;  . SHOULDER  ARTHROSCOPY Right 05/16/2019   Procedure: Right Shoulder Arthroscopy;  Surgeon: [name], MD;  Location: [facility] OR;  Service: Orthopedics;  Laterality: Right;    There were no vitals filed for this visit.      [facility] PT Assessment - 07/14/19 0001      AROM   Right Shoulder Flexion  148 Degrees    Right Shoulder ABduction  115 Degrees    Right Shoulder Internal Rotation  --   reach Lumbar    Right Shoulder External Rotation  --   Reach T2 with compensation                  [facility] Adult PT Treatment/Exercise - 07/14/19 0001      Shoulder Exercises: Supine   Other Supine Exercises  supine cane chest press and pullovers     Other Supine Exercises  manual isometric resistance for Right shoulder ER  5 sec x 10       Shoulder Exercises: Standing   Theraband Level (Shoulder External Rotation)  Level 1 (Yellow)    External Rotation Limitations  unable     Extension  20 reps    Theraband Level (Shoulder Extension)  Level 3 (Green)    Row  20 reps    Theraband Level (Shoulder Row)  Level 3 (Green)    Other Standing Exercises  standing cane flexion , abduction, ER              PT Education - 07/14/19 1344    Education Details  HEP    Person(s) Educated  Patient    Methods  Explanation;Handout    Comprehension  Verbalized understanding       PT Short Term Goals - 07/09/19 0639      PT SHORT TERM GOAL #1   Title  Pt will be Ind c an initial HEP    Baseline  no program    Time  3    Period  Weeks    Status  New    Target Date  07/30/19      PT SHORT TERM GOAL #2   Title  Pt will demonstrate R shoulder ER strength to neutral in L SL    Baseline  -30d less than neutral    Time  3    Period  Weeks    Status  New    Target Date  07/30/19        PT Long Term Goals - 07/09/19 1026      PT LONG TERM GOAL #1   Title  Improve R shoulder ER strength to 3/5 for improved functional use of the R UE    Baseline  2/5    Time  8    Period  Weeks    Status   New    Target Date  09/03/19      PT LONG TERM GOAL #2   Title  Improve AROM for R flex to 90d, abd to 100d in standing and ER to 10d in L SL  for improvedfunctional use of the R UE    Baseline  Flex 50d, abd 70d, ER -30    Time  8    Period  Weeks    Status  New    Target Date  09/03/19      PT LONG TERM GOAL #3   Title  Pt will report a decrease in R shoulder pain c forward and above head reaching with a range of 0-3/10.    Baseline  5    Time  8    Period  Weeks    Status  New    Target Date  09/03/19      PT LONG TERM GOAL #4   Title  Pt will be Ind a final HEP to inprove pt's R shoulder AROM, strength and functional use.    Baseline  No program    Time  8    Period  Weeks    Status  New    Target Date  09/03/19            Plan - 07/14/19 1231    Clinical Impression Statement  Flare up in Sciatic[place], starting prednisone today. Shoulder AROM improved. Still significant weakness in ER. Able to complete isometric ER in supine with assist from PTA to attain neutral. He was unable to get shoulder to neutral ER for standing wall isometric. Able to begin scapular stabilization and he was issused bands and given an updated HEP.    PT Next Visit Plan  Assess response to HEP. Continue rehab for strengthening and ROM    PT Home Exercise Plan  [id number]. Standing wand exs for R shoulder ER and flexion: added shoulder rows and extension with green band       Patient will benefit  from skilled therapeutic intervention in order to improve the following deficits and impairments:  Postural dysfunction, Improper body mechanics, Decreased range of motion, Pain, Impaired UE functional use, Decreased strength, Obesity  Visit Diagnosis: Muscle weakness (generalized)  Chronic pain in right shoulder     Problem List Patient Active Problem List   Diagnosis Date Noted  . Impingement syndrome of right shoulder   . Nontraumatic complete tear of right rotator cuff   . Osteomyelitis ([place])  03/11/2019  . Osteomyelitis of fifth toe of left foot ([place])   . Type 2 diabetes mellitus with foot ulcer, without long-term current use of insulin ([place])   . 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 S/P debridement 07/19/2016  . Acute gout of left elbow 07/19/2016    Dorene Ar, PTA 07/14/2019, 2:01 PM  [facility] [address] [place], [place], [address] Phone: [phone]   Fax:  [phone]  Name: Boruch Manuele MRN: [id number] Date of Birth: Oct 25, 1955

## 2019-07-14 NOTE — Patient Instructions (Signed)
Access Code: [email or web address] URL: [email or web address] Date: 07/14/2019 Prepared by: Jannette Spanner  Exercises Standing Shoulder External Rotation AAROM with Dowel - 3 x daily - 7 x weekly - 1 sets - 10-15 reps - 2 hold Shoulder Flexion Overhead with Dowel - 3 x daily - 7 x weekly - 1 sets - 10-15 reps - 2 hold Standing Bilateral Low Shoulder Row with Anchored Resistance - 2 x daily - 7 x weekly - 2 sets - 10 reps - 5 hold Shoulder extension with resistance - Neutral - 2 x daily - 7 x weekly - 2 sets - 10 reps - 5 hold

## 2019-07-16 ENCOUNTER — Ambulatory Visit: Payer: Medicaid Other | Admitting: Physical Therapy

## 2019-07-16 ENCOUNTER — Other Ambulatory Visit: Payer: Self-pay

## 2019-07-16 DIAGNOSIS — G8929 Other chronic pain: Secondary | ICD-10-CM

## 2019-07-16 DIAGNOSIS — M25511 Pain in right shoulder: Secondary | ICD-10-CM | POA: Diagnosis not present

## 2019-07-16 DIAGNOSIS — G4733 Obstructive sleep apnea (adult) (pediatric): Secondary | ICD-10-CM | POA: Diagnosis not present

## 2019-07-16 DIAGNOSIS — M6281 Muscle weakness (generalized): Secondary | ICD-10-CM | POA: Diagnosis not present

## 2019-07-16 NOTE — Therapy (Signed)
[place], [place], [address] Phone: [phone]   Fax:  [phone]  Physical Therapy Treatment  Patient Details  Name: Jon Jones MRN: [id number] Date of Birth: 29-Mar-1955 Referring Provider (PT): Shanon Rosser, [place]   Encounter Date: 07/16/2019  PT End of Session - 07/16/19 1235    Visit Number  3    Number of Visits  4    Date for PT Re-Evaluation  09/03/19    Authorization Type  MCD, 3 visits    Authorization Time Period  07/12/2019-07/25/19    Authorization - Visit Number  2    Authorization - Number of Visits  3    PT Start Time  1230    PT Stop Time  1308    PT Time Calculation (min)  38 min       Past Medical History:  Diagnosis Date  . 5 years ago   . AICD (automatic cardioverter/defibrillator) present   . Anxiety   . Arthritis   . CHF (congestive heart failure) ([place])   . Coronary artery disease   . Diabetes mellitus without complication ([place])   . Dysrhythmia   . GERD (gastroesophageal reflux disease)    PMH  . Hypertension   . Peripheral vascular disease ([place])   . Peripheral vascular disease ([place])   . Pneumonia   . Shoulder impingement, right   . Sleep apnea    wears cpap  . Stomach ulcer   . Wears glasses     Past Surgical History:  Procedure Laterality Date  . AMPUTATION Left 03/12/2019   Procedure: 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;  Surgeon: [name], MD;  Location: [facility];  Service: Orthopedics;  Laterality: Left;  . CARDIAC CATHETERIZATION    . CARDIAC DEFIBRILLATOR PLACEMENT    . I & D EXTREMITY Left 07/19/2016   Procedure: IRRIGATION AND DEBRIDEMENT EXTREMITY/OLECRANON(WASHOUT);  Surgeon: Leandrew Koyanagi, MD;  Location: [facility];  Service: Orthopedics;  Laterality: Left;  . OLECRANON BURSECTOMY Left 07/19/2016   Procedure: LEFT ELBOW OLECRANON BURSECTOMY;  Surgeon: Leandrew Koyanagi, MD;  Location: [facility];  Service: Orthopedics;  Laterality: Left;  . SHOULDER  ARTHROSCOPY Right 05/16/2019   Procedure: Right Shoulder Arthroscopy;  Surgeon: [name], MD;  Location: [facility];  Service: Orthopedics;  Laterality: Right;    There were no vitals filed for this visit.  Subjective Assessment - 07/16/19 1235    Subjective  Prednisone is helping, No pain in  shoulder or back.    Currently in Pain?  No/denies                       [facility] Adult PT Treatment/Exercise - 07/16/19 0001      Shoulder Exercises: Supine   Other Supine Exercises  supine cane chest press and pullovers     Other Supine Exercises  manual isometric resistance for Right shoulder ER  5 sec x 10    various positions of scaption     Shoulder Exercises: Seated   External Rotation  AAROM    External Rotation Limitations  using tray table and wash cloth for ER AAROM       Shoulder Exercises: Standing   Extension  20 reps    Theraband Level (Shoulder Extension)  Level 3 (Green)    Row  20 reps    Theraband Level (Shoulder Row)  Level 3 (Green)    Other Standing Exercises  standing cane flexion ,  abduction, ER       Shoulder Exercises: Pulleys   Flexion  2 minutes      Shoulder Exercises: ROM/Strengthening   UBE (Upper Arm Bike)  L1.5 2 min each way                PT Short Term Goals - 07/09/19 8338      PT SHORT TERM GOAL #1   Title  Pt will be Ind c an initial HEP    Baseline  no program    Time  3    Period  Weeks    Status  New    Target Date  07/30/19      PT SHORT TERM GOAL #2   Title  Pt will demonstrate R shoulder ER strength to neutral in L SL    Baseline  -30d less than neutral    Time  3    Period  Weeks    Status  New    Target Date  07/30/19        PT Long Term Goals - 07/09/19 1026      PT LONG TERM GOAL #1   Title  Improve R shoulder ER strength to 3/5 for improved functional use of the R UE    Baseline  2/5    Time  8    Period  Weeks    Status  New    Target Date  09/03/19      PT LONG TERM GOAL #2   Title   Improve AROM for R flex to 90d, abd to 100d in standing and ER to 10d in L SL  for improvedfunctional use of the R UE    Baseline  Flex 50d, abd 70d, ER -30    Time  8    Period  Weeks    Status  New    Target Date  09/03/19      PT LONG TERM GOAL #3   Title  Pt will report a decrease in R shoulder pain c forward and above head reaching with a range of 0-3/10.    Baseline  5    Time  8    Period  Weeks    Status  New    Target Date  09/03/19      PT LONG TERM GOAL #4   Title  Pt will be Ind a final HEP to inprove pt's R shoulder AROM, strength and functional use.    Baseline  No program    Time  8    Period  Weeks    Status  New    Target Date  09/03/19            Plan - 07/16/19 1305    Clinical Impression Statement  Pt reports predisone is helping with back and shoulder pain. Continued with AAROM and shoulder strengthening as tolerated.    PT Next Visit Plan  request additional visits from Medicaid nex visit. Assess response to HEP. Continue rehab for strengthening and ROM    PT Home Exercise Plan  [id number]. Standing wand exs for R shoulder ER and flexion: added shoulder rows and extension with green band       Patient will benefit from skilled therapeutic intervention in order to improve the following deficits and impairments:  Postural dysfunction, Improper body mechanics, Decreased range of motion, Pain, Impaired UE functional use, Decreased strength, Obesity  Visit Diagnosis: Muscle weakness (generalized)  Chronic pain in right shoulder  Problem List Patient Active Problem List   Diagnosis Date Noted  . Impingement syndrome of right shoulder   . Nontraumatic complete tear of right rotator cuff   . Osteomyelitis ([name]) 03/11/2019  . Osteomyelitis of fifth toe of left foot ([name])   . Type 2 diabetes mellitus with foot ulcer, without long-term current use of insulin ([name])   . 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 S/P debridement  07/19/2016  . Acute gout of left elbow 07/19/2016    Sherrie Mustache, PTA 07/16/2019, 1:12 PM  [facility] [address] [place], [place], [address] Phone: [phone]   Fax:  [phone]  Name: Jon Jones MRN: [id number] Date of Birth: 03/09/1956

## 2019-07-21 ENCOUNTER — Ambulatory Visit: Payer: Medicaid Other

## 2019-07-21 ENCOUNTER — Other Ambulatory Visit: Payer: Self-pay

## 2019-07-21 DIAGNOSIS — G8929 Other chronic pain: Secondary | ICD-10-CM | POA: Diagnosis not present

## 2019-07-21 DIAGNOSIS — M6281 Muscle weakness (generalized): Secondary | ICD-10-CM

## 2019-07-21 DIAGNOSIS — M25511 Pain in right shoulder: Secondary | ICD-10-CM | POA: Diagnosis not present

## 2019-07-22 NOTE — Therapy (Signed)
[place], [place], [address] Phone: [phone]   Fax:  [phone]  Physical Therapy Treatment/Re-Authorization Patient Details  Name: Jon Jones MRN: [id number] Date of Birth: [birth year] Referring Provider (PT): Shanon Rosser, [place]   Encounter Date: 07/21/2019  PT End of Session - 07/22/19 0805    Visit Number  4    Number of Visits  4    Date for PT Re-Evaluation  09/03/19    Authorization Type  MCD, 3 visits    Authorization - Visit Number  3    Authorization - Number of Visits  3    PT Start Time  1401    PT Stop Time  1448    PT Time Calculation (min)  47 min    Activity Tolerance  Patient tolerated treatment well    Behavior During Therapy  [facility] for tasks assessed/performed       Past Medical History:  Diagnosis Date  . 5 years ago   . AICD (automatic cardioverter/defibrillator) present   . Anxiety   . Arthritis   . CHF (congestive heart failure) ([place])   . Coronary artery disease   . Diabetes mellitus without complication ([place])   . Dysrhythmia   . GERD (gastroesophageal reflux disease)    PMH  . Hypertension   . Peripheral vascular disease ([place])   . Peripheral vascular disease ([place])   . Pneumonia   . Shoulder impingement, right   . Sleep apnea    wears cpap  . Stomach ulcer   . Wears glasses     Past Surgical History:  Procedure Laterality Date  . AMPUTATION Left 03/12/2019   Procedure: 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;  Surgeon: [name], MD;  Location: [facility];  Service: Orthopedics;  Laterality: Left;  . CARDIAC CATHETERIZATION    . CARDIAC DEFIBRILLATOR PLACEMENT    . I & D EXTREMITY Left 07/19/2016   Procedure: IRRIGATION AND DEBRIDEMENT EXTREMITY/OLECRANON(WASHOUT);  Surgeon: Leandrew Koyanagi, MD;  Location: [facility];  Service: Orthopedics;  Laterality: Left;  . OLECRANON BURSECTOMY Left 07/19/2016   Procedure: LEFT ELBOW OLECRANON BURSECTOMY;  Surgeon: Leandrew Koyanagi, MD;  Location:  [facility];  Service: Orthopedics;  Laterality: Left;  . SHOULDER ARTHROSCOPY Right 05/16/2019   Procedure: Right Shoulder Arthroscopy;  Surgeon: [name], MD;  Location: [facility];  Service: Orthopedics;  Laterality: Right;    There were no vitals filed for this visit.  Subjective Assessment - 07/21/19 1415    Subjective  Pt reports his R shoulder pain has improved with his experiencing pain intermittently now.    Currently in Pain?  Yes    Pain Score  5     Pain Location  Shoulder    Pain Orientation  Lateral;Upper    Pain Descriptors / Indicators  Aching    Pain Type  Chronic pain    Pain Onset  More than a month ago    Aggravating Factors   After exs    Pain Relieving Factors  Rest, prednisone    Effect of Pain on Daily Activities  The use of my R arm is improving         [facility] PT Assessment - 07/22/19 0001      AROM   Right Shoulder External Rotation  -5 Degrees      Hawkins-Kennedy test   Findings  Negative    Side  Right      Empty Can test  Findings  Negative    Side  Right      Full Can test   Findings  Positive    Side  Right    Comment  3+/5                   [facility] Adult PT Treatment/Exercise - 07/22/19 0001      Exercises   Exercises  Shoulder      Shoulder Exercises: Supine   Other Supine Exercises  supine cane chest press and pullovers; 15 reps; 2sets       Shoulder Exercises: Sidelying   External Rotation  Strengthening;Left;10 reps    External Rotation Limitations  3 sets    Other Sidelying Exercises  L; eccentric control 10x2; assisted the 1st 1/3 of lowering motion      Shoulder Exercises: Standing   Extension  Strengthening;10 reps;Theraband    Theraband Level (Shoulder Extension)  Level 3 (Green)    Extension Limitations  2 sets    Row  Strengthening;10 reps;Theraband    Theraband Level (Shoulder Row)  Level 3 (Green)    Row Weight (lbs)  2 sets    Other Standing Exercises  standing cane flexion , abduction,  ER  10x each      Shoulder Exercises: ROM/Strengthening   UBE (Upper Arm Bike)  L1; 2.5 mins each direction    Wall Wash  10x2;c mini-squat for facilitation    Other ROM/Strengthening Exercises  --    Other ROM/Strengthening Exercises  --               PT Short Term Goals - 07/22/19 0923      PT SHORT TERM GOAL #1   Title  Pt will be Ind c an initial HEP. MET    Baseline  no program      PT SHORT TERM GOAL #2   Title  Pt will demonstrate R shoulder ER strength to neutral in L SL. ON-GOING: Improved to -5 less than neutral    Baseline  -30d less than neutral    Target Date  07/30/19        PT Long Term Goals - 07/22/19 0816      PT LONG TERM GOAL #1   Title  Improve R shoulder ER strength to 3/5 for improved functional use of the R UE.ON-GOING: 2+/5    Baseline  2/5    Time  8    Period  Weeks    Status  On-going    Target Date  09/03/19      PT LONG TERM GOAL #2   Title  Improve AROM for R flex to 90d, abd to 100d in standing and ER to 10d in L SL  for improvedfunctional use of the R UE.PARTIALLY MET: Flex 148d, abd 70d, ER -5d.    Baseline  Flex 50d, abd 70d, ER -30    Time  8    Period  Weeks    Status  Partially Met    Target Date  09/03/19      PT LONG TERM GOAL #3   Title  Pt will report a decrease in R shoulder pain c forward and above head reaching with a range of 0-3/10. PARTIALLY MET: Intermittent 5/10    Baseline  5/10 constant    Time  8    Period  Weeks    Status  Partially Met    Target Date  09/03/19  Plan - 07/22/19 0807    Clinical Impression Statement  Pt is making good progress re: pain, strength, and ROM of the R shoulder. In R SL, ER has increased from -30 from neutral to -5. AROM of the R shoulder has increased c flexion from 50d to 148d and abd from 75 to 115d. Pain which was constant is now intermittent.    Personal Factors and Comorbidities  Comorbidity 3+    Examination-Activity Limitations  Carry;Lift;Reach  Overhead;Self Feeding    Clinical Decision Making  Moderate    Rehab Potential  Good    PT Frequency  2x / week    PT Duration  2 weeks    PT Treatment/Interventions  Moist Heat;Therapeutic activities;Therapeutic exercise;Manual techniques;Taping;Dry needling;Passive range of motion;Patient/family education;Electrical Stimulation;Iontophoresis 88m/ml Dexamethasone    PT Next Visit Plan  Continue rehab for strengthening and ROM       Patient will benefit from skilled therapeutic intervention in order to improve the following deficits and impairments:  Postural dysfunction, Improper body mechanics, Decreased range of motion, Pain, Impaired UE functional use, Decreased strength, Obesity  Visit Diagnosis: Muscle weakness (generalized)  Chronic pain in right shoulder     Problem List Patient Active Problem List   Diagnosis Date Noted  . Impingement syndrome of right shoulder   . Nontraumatic complete tear of right rotator cuff   . Osteomyelitis ([place] 03/11/2019  . Osteomyelitis of fifth toe of left foot ([place]   . Type 2 diabetes mellitus with foot ulcer, without long-term current use of insulin ([place]   . 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 S/P debridement 07/19/2016  . Acute gout of left elbow 07/19/2016    AGar PontoMS, PT 07/22/19 1:14 PM  [facility] [address]Phone: [phone]  Fax:  [phone] Name: OKraig GenisMRN: [id number] of Birth: 602-13-1957

## 2019-07-23 ENCOUNTER — Ambulatory Visit: Payer: Medicaid Other

## 2019-07-24 ENCOUNTER — Encounter: Payer: Self-pay | Admitting: Physician Assistant

## 2019-07-24 ENCOUNTER — Ambulatory Visit (INDEPENDENT_AMBULATORY_CARE_PROVIDER_SITE_OTHER): Payer: Medicaid Other | Admitting: Physician Assistant

## 2019-07-24 ENCOUNTER — Other Ambulatory Visit: Payer: Self-pay

## 2019-07-24 DIAGNOSIS — M25511 Pain in right shoulder: Secondary | ICD-10-CM

## 2019-07-24 NOTE — Progress Notes (Signed)
Office Visit Note   [name]           Date of Birth: 1955/08/21           MRN: [id number] Visit Date: 07/24/2019              Requested by: No referring provider defined for this encounter. PCP: System, Provider Not In  Chief Complaint  Patient presents with  . Right Shoulder - Pain, Routine Post Op      HPI: Patient presents today 9 weeks status post right shoulder arthroscopy.  He is doing better and has been working with physical therapy he denies any pain except on the extreme of rotating his shoulder.  He has also been taking some prednisone 20 mg daily with breakfast to help with some sciatica though symptoms to have greatly decreased  Assessment & Plan: Visit Diagnoses: No diagnosis found.  Plan: He will wean down on the prednisone and eventually wean off of it we discussed this.  If he had persistent sciatica symptoms that return he could be referred for an epidural steroid injection.  With regards to his shoulder I think he may follow-up as needed  Follow-Up Instructions: No follow-ups on file.   Ortho Exam  Patient is alert, oriented, no adenopathy, well-dressed, normal affect, normal respiratory effort. Right shoulder full forward extension internal rotation behind the back good strength with resisted abduction he gets some pain with elevating and extending his elbow but this is not functionally difficult for him.  Well-healed surgical portals  Imaging: No results found. No images are attached to the encounter.  Labs: Lab Results  Component Value Date   HGBA1C 7.6 (H) 03/11/2019   ESRSEDRATE 11 03/11/2019   CRP 1.8 (H) 03/11/2019   REPTSTATUS 07/24/2016 FINAL 07/19/2016   GRAMSTAIN  07/19/2016    RARE WBC PRESENT, PREDOMINANTLY MONONUCLEAR NO ORGANISMS SEEN    CULT NO GROWTH 5 DAYS 07/19/2016     Lab Results  Component Value Date   ALBUMIN 3.7 03/11/2019    No results found for: MG No results found for: VD25OH  No results found for:  PREALBUMIN CBC EXTENDED Latest Ref Rng & Units 05/16/2019 03/13/2019 03/12/2019  WBC 4.0 - 10.5 K/uL - 9.5 9.2  RBC 4.22 - 5.81 MIL/uL - 4.43 4.58  HGB 13.0 - 17.0 g/dL 14.6 13.7 14.2  HCT 39.0 - 52.0 % 43.0 42.4 44.0  PLT 150 - 400 K/uL - 197 198  NEUTROABS 1.7 - 7.7 K/uL - - -  LYMPHSABS 0.7 - 4.0 K/uL - - -     There is no height or weight on file to calculate BMI.  Orders:  No orders of the defined types were placed in this encounter.  No orders of the defined types were placed in this encounter.    Procedures: No procedures performed  Clinical Data: No additional findings.  ROS:  All other systems negative, except as noted in the HPI. Review of Systems  Objective: Vital Signs: There were no vitals taken for this visit.  Specialty Comments:  No specialty comments available.  PMFS History: Patient Active Problem List   Diagnosis Date Noted  . Impingement syndrome of right shoulder   . Nontraumatic complete tear of right rotator cuff   . Osteomyelitis ([place]) 03/11/2019  . Osteomyelitis of fifth toe of left foot ([place])   . Type 2 diabetes mellitus with foot ulcer, without long-term current use of insulin ([place])   . 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 S/P debridement 07/19/2016  . Acute gout of left elbow 07/19/2016   Past Medical History:  Diagnosis Date  . 5 years ago   . AICD (automatic cardioverter/defibrillator) present   . Anxiety   . Arthritis   . CHF (congestive heart failure) ([name])   . Coronary artery disease   . Diabetes mellitus without complication ([name])   . Dysrhythmia   . GERD (gastroesophageal reflux disease)    PMH  . Hypertension   . Peripheral vascular disease ([name])   . Peripheral vascular disease ([name])   . Pneumonia   . Shoulder impingement, right   . Sleep apnea    wears cpap  . Stomach ulcer   . Wears glasses     Family History  Problem Relation Age of Onset  . Heart disease Mother   . Diabetes  Mother   . Peripheral vascular disease Mother     Past Surgical History:  Procedure Laterality Date  . AMPUTATION Left 03/12/2019   Procedure: 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;  Surgeon: [name], MD;  Location: [facility];  Service: Orthopedics;  Laterality: Left;  . CARDIAC CATHETERIZATION    . CARDIAC DEFIBRILLATOR PLACEMENT    . I & D EXTREMITY Left 07/19/2016   Procedure: IRRIGATION AND DEBRIDEMENT EXTREMITY/OLECRANON(WASHOUT);  Surgeon: Tarry Kos, MD;  Location[facility] OR;  Service: Orthopedics;  Laterality: Left;  . OLECRANON BURSECTOMY Left 07/19/2016   Procedure: LEFT ELBOW OLECRANON BURSECTOMY;  Surgeon: Tarry Kos, MD;  Location: [facility];  Service: Orthopedics;  Laterality: Left;  . SHOULDER ARTHROSCOPY Right 05/16/2019   Procedure: Right Shoulder Arthroscopy;  Surgeon: [name], MD;  Location: [facility] OR;  Service: Orthopedics;  Laterality: Right;   Social History   Occupational History  . Not on file  Tobacco Use  . Smoking status: Former Smoker    Packs/day: 1.00    Years: 40.00    Pack years: 40.00    Types: Cigarettes    Quit date: 03/11/2019    Years since quitting: 0.3  . Smokeless tobacco: Never Used  Substance and Sexual Activity  . Alcohol use: Not Currently  . Drug use: Yes    Types: Oxycodone  . Sexual activity: Not on file

## 2019-07-28 ENCOUNTER — Ambulatory Visit: Payer: Medicaid Other

## 2019-07-28 ENCOUNTER — Other Ambulatory Visit: Payer: Self-pay

## 2019-07-28 DIAGNOSIS — M6281 Muscle weakness (generalized): Secondary | ICD-10-CM

## 2019-07-28 DIAGNOSIS — G8929 Other chronic pain: Secondary | ICD-10-CM | POA: Diagnosis not present

## 2019-07-28 DIAGNOSIS — M25511 Pain in right shoulder: Secondary | ICD-10-CM

## 2019-07-29 NOTE — Therapy (Signed)
[place], [place], [address] Phone: [phone]   Fax:  [phone]  Physical Therapy Treatment  Patient Details  Name: Jon Jones MRN: [id number] Date of Birth: 03-15-1955 Referring Provider (PT): Shanon Rosser, [place]   Encounter Date: 07/28/2019  PT End of Session - 07/28/19 1428    Visit Number  5    Number of Visits  16    Date for PT Re-Evaluation  09/05/19    PT Start Time  5361    PT Stop Time  1445    PT Time Calculation (min)  42 min    Activity Tolerance  Patient tolerated treatment well    Behavior During Therapy  [facility] for tasks assessed/performed       Past Medical History:  Diagnosis Date  . 5 years ago   . AICD (automatic cardioverter/defibrillator) present   . Anxiety   . Arthritis   . CHF (congestive heart failure) ([place])   . Coronary artery disease   . Diabetes mellitus without complication ([place])   . Dysrhythmia   . GERD (gastroesophageal reflux disease)    PMH  . Hypertension   . Peripheral vascular disease ([place])   . Peripheral vascular disease ([place])   . Pneumonia   . Shoulder impingement, right   . Sleep apnea    wears cpap  . Stomach ulcer   . Wears glasses     Past Surgical History:  Procedure Laterality Date  . AMPUTATION Left 03/12/2019   Procedure: 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;  Surgeon: [name], MD;  Location: [place];  Service: Orthopedics;  Laterality: Left;  . CARDIAC CATHETERIZATION    . CARDIAC DEFIBRILLATOR PLACEMENT    . I & D EXTREMITY Left 07/19/2016   Procedure: IRRIGATION AND DEBRIDEMENT EXTREMITY/OLECRANON(WASHOUT);  Surgeon: Leandrew Koyanagi, MD;  Location: [place];  Service: Orthopedics;  Laterality: Left;  . OLECRANON BURSECTOMY Left 07/19/2016   Procedure: LEFT ELBOW OLECRANON BURSECTOMY;  Surgeon: Leandrew Koyanagi, MD;  Location: [place];  Service: Orthopedics;  Laterality: Left;  . SHOULDER ARTHROSCOPY Right 05/16/2019   Procedure: Right  Shoulder Arthroscopy;  Surgeon: [name], MD;  Location: [place];  Service: Orthopedics;  Laterality: Right;    There were no vitals filed for this visit.  Subjective Assessment - 07/28/19 1410    Subjective  Pt reports he is weaning himself from oxycodone to 1 tab per 2 days. On the 2nd day he reports he will experience some R shoulder pain. Pt reports last taking yesterday and he is not experiencing any pain yet today.    Currently in Pain?  No/denies    Pain Location  Shoulder    Pain Orientation  Right    Pain Type  Chronic pain    Pain Onset  More than a month ago    Pain Frequency  Intermittent    Aggravating Factors   After exs    Pain Relieving Factors  rest, meds    Effect of Pain on Daily Activities  Mod limitation                        [facility] Adult PT Treatment/Exercise - 07/29/19 0001      Exercises   Exercises  Shoulder      Shoulder Exercises: Supine   Protraction  AROM;Strengthening;Both;12 reps    Protraction Limitations  dowel; 2 sets; to overhead    Horizontal ABduction  Strengthening;Both;12  reps;Theraband    Theraband Level (Shoulder Horizontal ABduction)  Level 1 (Yellow)    Flexion  AROM;Strengthening;Left;12 reps    Flexion Limitations  2 sets      Shoulder Exercises: Sidelying   External Rotation  Strengthening;Left;10 reps    External Rotation Limitations  3 sets    Other Sidelying Exercises  L; ER eccentric control 10x2; assisted the 1st 1/3 of lowering motion      Shoulder Exercises: Standing   Extension  Strengthening;Theraband;12 reps    Theraband Level (Shoulder Extension)  Level 3 (Green)    Extension Limitations  2 Counselling psychologist;Theraband;12 reps    Theraband Level (Shoulder Row)  Level 3 (Green)    Row Weight (lbs)  2 sets      Shoulder Exercises: ROM/Strengthening   UBE (Upper Arm Bike)  L1; 1 mins each direction x 3 for 6 mins    Wall Wash  10x3;c mini-squat for facilitation             PT  Education - 07/29/19 0901    Education Details  Reviewed eccentric ER control ex with pt which he is completing corrrectly    Person(s) Educated  Patient;Child(ren)    Methods  Explanation;Demonstration;Tactile cues;Verbal cues    Comprehension  Verbalized understanding;Returned demonstration       PT Short Term Goals - 07/22/19 0814      PT SHORT TERM GOAL #1   Title  Pt will be Ind c an initial HEP. MET    Baseline  no program      PT SHORT TERM GOAL #2   Title  Pt will demonstrate R shoulder ER strength to neutral in L SL. ON-GOING: Improved to -5 less than neutral    Baseline  -30d less than neutral    Target Date  07/30/19        PT Long Term Goals - 07/22/19 0816      PT LONG TERM GOAL #1   Title  Improve R shoulder ER strength to 3/5 for improved functional use of the R UE.ON-GOING: 2+/5    Baseline  2/5    Time  8    Period  Weeks    Status  On-going    Target Date  09/03/19      PT LONG TERM GOAL #2   Title  Improve AROM for R flex to 90d, abd to 100d in standing and ER to 10d in L SL  for improvedfunctional use of the R UE.PARTIALLY MET: Flex 148d, abd 70d, ER -5d.    Baseline  Flex 50d, abd 70d, ER -30    Time  8    Period  Weeks    Status  Partially Met    Target Date  09/03/19      PT LONG TERM GOAL #3   Title  Pt will report a decrease in R shoulder pain c forward and above head reaching with a range of 0-3/10. PARTIALLY MET: Intermittent 5/10    Baseline  5/10 constant    Time  8    Period  Weeks    Status  Partially Met    Target Date  09/03/19            Plan - 07/29/19 0908    Clinical Impression Statement  PT continues to address R shoulder/scapular strengthening c emphasis on the ERs. R ER motion in SL is limited to less than completion to neutral. Pt is able to complete  R shoulder flexion c long level arm with some discomfort and fatigue.    PT Treatment/Interventions  Moist Heat;Therapeutic activities;Therapeutic exercise;Manual  techniques;Taping;Dry needling;Passive range of motion;Patient/family education;Electrical Stimulation;Iontophoresis 76m/ml Dexamethasone    PT Next Visit Plan  Continue rehab for strengthening, ROM, and function of the R shoulder    PT Home Exercise Plan  [id number]. R shoulder eccentric ER added in SL       Patient will benefit from skilled therapeutic intervention in order to improve the following deficits and impairments:  Postural dysfunction, Improper body mechanics, Decreased range of motion, Pain, Impaired UE functional use, Decreased strength, Obesity  Visit Diagnosis: Muscle weakness (generalized)  Chronic pain in right shoulder     Problem List Patient Active Problem List   Diagnosis Date Noted  . Impingement syndrome of right shoulder   . Nontraumatic complete tear of right rotator cuff   . Osteomyelitis ([place] 03/11/2019  . Osteomyelitis of fifth toe of left foot ([place]   . Type 2 diabetes mellitus with foot ulcer, without long-term current use of insulin ([place]   . 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 S/P debridement 07/19/2016  . Acute gout of left elbow 07/19/2016    AGar PontoMS, PT 07/29/19 9:16 AM  [facility] [address]Phone: [phone]  Fax:  [phone] Name: Jon GruenbergMRN: [id number] of Birth: [birth year]

## 2019-07-30 ENCOUNTER — Other Ambulatory Visit: Payer: Self-pay

## 2019-07-30 ENCOUNTER — Ambulatory Visit: Payer: Medicaid Other

## 2019-07-30 DIAGNOSIS — M6281 Muscle weakness (generalized): Secondary | ICD-10-CM | POA: Diagnosis not present

## 2019-07-30 DIAGNOSIS — G8929 Other chronic pain: Secondary | ICD-10-CM | POA: Diagnosis not present

## 2019-07-30 DIAGNOSIS — M25511 Pain in right shoulder: Secondary | ICD-10-CM | POA: Diagnosis not present

## 2019-07-30 NOTE — Therapy (Signed)
[place], [place], [address] Phone: [phone]   Fax:  [phone]  Physical Therapy Treatment  Patient Details  Name: Jon Jones MRN: [id number] Date of Birth: 13-Jan-1956 Referring Provider (PT): Shanon Rosser, [place]   Encounter Date: 07/30/2019  PT End of Session - 07/30/19 1439    Visit Number  6    Number of Visits  16    Date for PT Re-Evaluation  09/05/19    PT Start Time  2836    PT Stop Time  1455    PT Time Calculation (min)  53 min    Activity Tolerance  Patient tolerated treatment well    Behavior During Therapy  [facility] for tasks assessed/performed       Past Medical History:  Diagnosis Date  . 5 years ago   . AICD (automatic cardioverter/defibrillator) present   . Anxiety   . Arthritis   . CHF (congestive heart failure) ([place])   . Coronary artery disease   . Diabetes mellitus without complication ([place])   . Dysrhythmia   . GERD (gastroesophageal reflux disease)    PMH  . Hypertension   . Peripheral vascular disease ([place])   . Peripheral vascular disease ([place])   . Pneumonia   . Shoulder impingement, right   . Sleep apnea    wears cpap  . Stomach ulcer   . Wears glasses     Past Surgical History:  Procedure Laterality Date  . AMPUTATION Left 03/12/2019   Procedure: 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;  Surgeon: [name], MD;  Location: [place];  Service: Orthopedics;  Laterality: Left;  . CARDIAC CATHETERIZATION    . CARDIAC DEFIBRILLATOR PLACEMENT    . I & D EXTREMITY Left 07/19/2016   Procedure: IRRIGATION AND DEBRIDEMENT EXTREMITY/OLECRANON(WASHOUT);  Surgeon: Leandrew Koyanagi, MD;  Location: [place];  Service: Orthopedics;  Laterality: Left;  . OLECRANON BURSECTOMY Left 07/19/2016   Procedure: LEFT ELBOW OLECRANON BURSECTOMY;  Surgeon: Leandrew Koyanagi, MD;  Location: [place];  Service: Orthopedics;  Laterality: Left;  . SHOULDER ARTHROSCOPY Right 05/16/2019   Procedure: Right  Shoulder Arthroscopy;  Surgeon: [name], MD;  Location: [place];  Service: Orthopedics;  Laterality: Right;    There were no vitals filed for this visit.  Subjective Assessment - 07/30/19 1408    Subjective  Pt reports 0/10 pain. Pt is taking oxycodone 1x per night and prednisone 1x per day.    Currently in Pain?  Yes                        [facility] Adult PT Treatment/Exercise - 07/30/19 0001      Exercises   Exercises  Shoulder      Shoulder Exercises: Sidelying   External Rotation  Strengthening;Left;10 reps    External Rotation Limitations  3 sets    Other Sidelying Exercises  L; ER eccentric control 10x2; wand assisted the 1st 1/3 of lowering motion      Shoulder Exercises: Standing   Protraction  Strengthening;Left;10 reps    Protraction Limitations  on wall; 3 sets    Flexion  AROM;Strengthening;Right;10 reps    Extension  Strengthening;Theraband;12 reps    Theraband Level (Shoulder Extension)  Level 3 (Green)    Extension Limitations  2 Counselling psychologist;Theraband;12 reps    Theraband Level (Shoulder Row)  Level 3 (Green)    Row Weight (lbs)  2  sets      Shoulder Exercises: ROM/Strengthening   UBE (Upper Arm Bike)  L1; 1 mins each direction x 3 for 6 mins    Wall Wash  10x3;c mini-squat for facilitation      Modalities   Modalities  Cryotherapy      Cryotherapy   Number Minutes Cryotherapy  10 Minutes    Cryotherapy Location  Shoulder    Type of Cryotherapy  Ice pack               PT Short Term Goals - 07/22/19 0347      PT SHORT TERM GOAL #1   Title  Pt will be Ind c an initial HEP. MET    Baseline  no program      PT SHORT TERM GOAL #2   Title  Pt will demonstrate R shoulder ER strength to neutral in L SL. ON-GOING: Improved to -5 less than neutral    Baseline  -30d less than neutral    Target Date  07/30/19        PT Long Term Goals - 07/22/19 0816      PT LONG TERM GOAL #1   Title  Improve R shoulder ER  strength to 3/5 for improved functional use of the R UE.ON-GOING: 2+/5    Baseline  2/5    Time  8    Period  Weeks    Status  On-going    Target Date  09/03/19      PT LONG TERM GOAL #2   Title  Improve AROM for R flex to 90d, abd to 100d in standing and ER to 10d in L SL  for improvedfunctional use of the R UE.PARTIALLY MET: Flex 148d, abd 70d, ER -5d.    Baseline  Flex 50d, abd 70d, ER -30    Time  8    Period  Weeks    Status  Partially Met    Target Date  09/03/19      PT LONG TERM GOAL #3   Title  Pt will report a decrease in R shoulder pain c forward and above head reaching with a range of 0-3/10. PARTIALLY MET: Intermittent 5/10    Baseline  5/10 constant    Time  8    Period  Weeks    Status  Partially Met    Target Date  09/03/19            Plan - 07/30/19 1442    Clinical Impression Statement  PT was provided to address R shoulder/scapular c empahasis on the ERs. Pt reported discomfort after PT and a cold pack was applied. R shoulder strength remains similar with pt able to cpmplete flexion in standing c min discomfort, but limited c ER strength in L SL.    PT Treatment/Interventions  Moist Heat;Therapeutic activities;Therapeutic exercise;Manual techniques;Taping;Dry needling;Passive range of motion;Patient/family education;Electrical Stimulation;Iontophoresis 30m/ml Dexamethasone    PT Next Visit Plan  Continue rehab for strengthening, ROM, and function of the R shoulder    PT Home Exercise Plan  [id number].       Patient will benefit from skilled therapeutic intervention in order to improve the following deficits and impairments:  Postural dysfunction, Improper body mechanics, Decreased range of motion, Pain, Impaired UE functional use, Decreased strength, Obesity  Visit Diagnosis: Muscle weakness (generalized)  Chronic pain in right shoulder     Problem List Patient Active Problem List   Diagnosis Date Noted  . Impingement syndrome of right shoulder   .  Nontraumatic complete tear of right rotator cuff   . Osteomyelitis ([place]) 03/11/2019  . Osteomyelitis of fifth toe of left foot ([place])   . Type 2 diabetes mellitus with foot ulcer, without long-term current use of insulin ([place])   . 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 S/P debridement 07/19/2016  . Acute gout of left elbow 07/19/2016   Gar Ponto MS, PT 07/30/19 4:18 PM  [place] [facility] [address] [place], [place], [address] Phone: [phone]   Fax:  [phone]  Name: Aedan Geimer MRN: [id number] Date of Birth: [birth year]

## 2019-08-04 ENCOUNTER — Encounter: Payer: Self-pay | Admitting: Physical Therapy

## 2019-08-04 ENCOUNTER — Other Ambulatory Visit: Payer: Self-pay

## 2019-08-04 ENCOUNTER — Ambulatory Visit: Payer: Medicaid Other | Admitting: Physical Therapy

## 2019-08-04 DIAGNOSIS — M25511 Pain in right shoulder: Secondary | ICD-10-CM | POA: Diagnosis not present

## 2019-08-04 DIAGNOSIS — M6281 Muscle weakness (generalized): Secondary | ICD-10-CM | POA: Diagnosis not present

## 2019-08-04 DIAGNOSIS — G8929 Other chronic pain: Secondary | ICD-10-CM | POA: Diagnosis not present

## 2019-08-04 NOTE — Therapy (Signed)
[place], [place], [address] Phone: [phone]   Fax:  [phone]  Physical Therapy Treatment  Patient Details  Name: Jon Jones MRN: [id number] Date of Birth: Oct 23, 1955 Referring Provider (PT): Shanon Rosser, [place]   Encounter Date: 08/04/2019  PT End of Session - 08/04/19 1324    Visit Number  7    Number of Visits  16    Date for PT Re-Evaluation  09/05/19    Authorization Type  Medicaid    Authorization Time Period  07/28/19-08/10/19    Authorization - Visit Number  3    Authorization - Number of Visits  4    PT Start Time  1326    PT Stop Time  1404    PT Time Calculation (min)  38 min    Activity Tolerance  Patient tolerated treatment well    Behavior During Therapy  [facility] for tasks assessed/performed       Past Medical History:  Diagnosis Date  . 5 years ago   . AICD (automatic cardioverter/defibrillator) present   . Anxiety   . Arthritis   . CHF (congestive heart failure) ([place])   . Coronary artery disease   . Diabetes mellitus without complication ([place])   . Dysrhythmia   . GERD (gastroesophageal reflux disease)    PMH  . Hypertension   . Peripheral vascular disease ([place])   . Peripheral vascular disease ([place])   . Pneumonia   . Shoulder impingement, right   . Sleep apnea    wears cpap  . Stomach ulcer   . Wears glasses     Past Surgical History:  Procedure Laterality Date  . AMPUTATION Left 03/12/2019   Procedure: 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;  Surgeon: [name], MD;  Location: [place];  Service: Orthopedics;  Laterality: Left;  . CARDIAC CATHETERIZATION    . CARDIAC DEFIBRILLATOR PLACEMENT    . I & D EXTREMITY Left 07/19/2016   Procedure: IRRIGATION AND DEBRIDEMENT EXTREMITY/OLECRANON(WASHOUT);  Surgeon: Leandrew Koyanagi, MD;  Location: [place];  Service: Orthopedics;  Laterality: Left;  . OLECRANON BURSECTOMY Left 07/19/2016   Procedure: LEFT ELBOW OLECRANON BURSECTOMY;  Surgeon:  Leandrew Koyanagi, MD;  Location: [place];  Service: Orthopedics;  Laterality: Left;  . SHOULDER ARTHROSCOPY Right 05/16/2019   Procedure: Right Shoulder Arthroscopy;  Surgeon: [name], MD;  Location: [place];  Service: Orthopedics;  Laterality: Right;    There were no vitals filed for this visit.  Subjective Assessment - 08/04/19 1327    Subjective  Pt. reports right shoulder about the same-pain 4-5/10 this PM. He is still taking oxycodone 1x per night and also continues taking prednisone for sciatica. Next MD follow up is tomorrow.    Limitations  House hold activities;Other (comment)    Currently in Pain?  Yes    Pain Score  --   4-5/10   Pain Location  Shoulder    Pain Orientation  Right    Pain Descriptors / Indicators  Aching    Pain Type  Chronic pain    Pain Onset  More than a month ago    Pain Frequency  Intermittent    Aggravating Factors   after exercises    Pain Relieving Factors  rest, medication    Effect of Pain on Daily Activities  limits reaching ADLs         [facility] PT Assessment - 08/04/19 0001      AROM  Right Shoulder Flexion  148 Degrees    Right Shoulder ABduction  130 Degrees    Right Shoulder External Rotation  -5 Degrees                    [facility] Adult PT Treatment/Exercise - 08/04/19 0001      Elbow Exercises   Elbow Flexion  AROM;Right   2x12     Shoulder Exercises: Supine   Protraction  AROM;Strengthening;Right;12 reps    Protraction Limitations  2 sets    Horizontal ABduction  AROM;Strengthening;Both;12 reps    Theraband Level (Shoulder Horizontal ABduction)  Level 1 (Yellow)    Horizontal ABduction Limitations  2 sets    Flexion Limitations  supine wand chest press to overhead flexion 2x12      Shoulder Exercises: Sidelying   External Rotation  Strengthening;Left;10 reps   left hand assist concentric motion for sets 2-3   External Rotation Limitations  3 sets    ABduction  AROM;Strengthening;Right;12 reps     ABduction Limitations  2 sets-plane of scaption      Shoulder Exercises: Standing   Internal Rotation  AROM;Strengthening;Right;12 reps    Theraband Level (Shoulder Internal Rotation)  Level 3 (Green)    Internal Rotation Limitations  2 sets    Extension  Strengthening;Theraband;12 reps    Theraband Level (Shoulder Extension)  Level 3 (Green)    Extension Limitations  2 Counselling psychologist;Theraband;12 reps    Theraband Level (Shoulder Row)  Level 3 (Green)    Row Weight (lbs)  2 sets    Other Standing Exercises  UE ranger assisted flexion AAROM 2x10   assist at elbow to keep motion in sagittal plane   Other Standing Exercises  "bent over" row right side 2 lbs. 2x12      Shoulder Exercises: ROM/Strengthening   UBE (Upper Arm Bike)  L1; 1 mins each direction x 3 for 6 mins    Wall Wash  10x3;c mini-squat for facilitation      Shoulder Exercises: Isometric Strengthening   ABduction Limitations  15 x 5 sec holds with small ball at wall             PT Education - 08/04/19 1405    Education Details  shoulder anatomy, POC    Person(s) Educated  Patient    Methods  Explanation    Comprehension  Verbalized understanding       PT Short Term Goals - 07/22/19 0814      PT SHORT TERM GOAL #1   Title  Pt will be Ind c an initial HEP. MET    Baseline  no program      PT SHORT TERM GOAL #2   Title  Pt will demonstrate R shoulder ER strength to neutral in L SL. ON-GOING: Improved to -5 less than neutral    Baseline  -30d less than neutral    Target Date  07/30/19        PT Long Term Goals - 07/22/19 0816      PT LONG TERM GOAL #1   Title  Improve R shoulder ER strength to 3/5 for improved functional use of the R UE.ON-GOING: 2+/5    Baseline  2/5    Time  8    Period  Weeks    Status  On-going    Target Date  09/03/19      PT LONG TERM GOAL #2   Title  Improve AROM for R flex  to 90d, abd to 100d in standing and ER to 10d in L SL  for improvedfunctional use of  the R UE.PARTIALLY MET: Flex 148d, abd 70d, ER -5d.    Baseline  Flex 50d, abd 70d, ER -30    Time  8    Period  Weeks    Status  Partially Met    Target Date  09/03/19      PT LONG TERM GOAL #3   Title  Pt will report a decrease in R shoulder pain c forward and above head reaching with a range of 0-3/10. PARTIALLY MET: Intermittent 5/10    Baseline  5/10 constant    Time  8    Period  Weeks    Status  Partially Met    Target Date  09/03/19            Plan - 08/04/19 1403    Clinical Impression Statement  Continued right shoulder exercise progresssion-shoulder ER ROM remains greatest limitation with associated difficulty maintaining shoulder alignment in sagittal plane for flexion ROM. Other than muscle weakness/fatigue exercises today were well-tolerated. Pt. would benefit from continued PT for further progression of shoulder ROM and strengthening to improve functional use of RUE for reaching ADLs.    Personal Factors and Comorbidities  Comorbidity 3+    Comorbidities  CHF,anxiety, DM    Examination-Activity Limitations  Carry;Lift;Reach Overhead;Self Feeding    Stability/Clinical Decision Making  Evolving/Moderate complexity    Clinical Decision Making  Moderate    Rehab Potential  Good    PT Frequency  2x / week    PT Duration  2 weeks    PT Treatment/Interventions  Moist Heat;Therapeutic activities;Therapeutic exercise;Manual techniques;Taping;Dry needling;Passive range of motion;Patient/family education;Electrical Stimulation;Iontophoresis 85m/ml Dexamethasone    PT Next Visit Plan  ERO next visit (will be visit 4 of 4 currently approved insurance visits per chart notes) Continue rehab for strengthening, ROM, and function of the R shoulder    PT Home Exercise Plan  [id number].    Consulted and Agree with Plan of Care  Patient       Patient will benefit from skilled therapeutic intervention in order to improve the following deficits and impairments:  Postural dysfunction,  Improper body mechanics, Decreased range of motion, Pain, Impaired UE functional use, Decreased strength, Obesity  Visit Diagnosis: Muscle weakness (generalized)  Chronic pain in right shoulder     Problem List Patient Active Problem List   Diagnosis Date Noted  . Impingement syndrome of right shoulder   . Nontraumatic complete tear of right rotator cuff   . Osteomyelitis ([facility] 03/11/2019  . Osteomyelitis of fifth toe of left foot ([place]   . Type 2 diabetes mellitus with foot ulcer, without long-term current use of insulin ([place]   . 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 S/P debridement 07/19/2016  . Acute gout of left elbow 07/19/2016    CBeaulah Dinning PT, DPT 08/04/19 2:10 PM  [facility] [place] [address]Phone: [phone]  Fax:  [phone] Name: OQuamaine WebbMRN: [id number] of Birth: 6February 13, 1957

## 2019-08-05 ENCOUNTER — Ambulatory Visit (INDEPENDENT_AMBULATORY_CARE_PROVIDER_SITE_OTHER): Payer: Medicaid Other | Admitting: Physician Assistant

## 2019-08-05 ENCOUNTER — Encounter: Payer: Self-pay | Admitting: Physician Assistant

## 2019-08-05 DIAGNOSIS — M5416 Radiculopathy, lumbar region: Secondary | ICD-10-CM

## 2019-08-05 NOTE — Progress Notes (Signed)
Office Visit Note   [name]           Date of Birth: 04/30/1955           MRN: [id number] Visit Date: 08/05/2019              Requested by: No referring provider defined for this encounter. PCP: System, Provider Not In  Chief Complaint  Patient presents with  . Right Shoulder - Follow-up      HPI: This is a pleasant 64 year old gentleman who I have been following.  He is status post right shoulder arthroscopy and debridement.  With regards to this he is doing quite well and has no complaints.  He has also been followed for lower back pain radiating down his left buttock into the back of his leg as far as his left knee.  We did try a dose of steroids which helps minimally.  He relates this all happened secondary to a fall he sustained in January which also caused his shoulder issues.  He denies any weakness or paresthesias.  Unfortunately he cannot have an MRI because he has a pacemaker and a implanted defibrillator  Assessment & Plan: Visit Diagnoses:  1. Lumbar back pain with radiculopathy affecting left lower extremity     Plan: Left-sided sciatica.  Patient will be referred to physical medicine to see if an epidural steroid injection is possible without the MRI.  Prior to this episode he did not have any difficulties with his back  Follow-Up Instructions: No follow-ups on file.   Ortho Exam  Patient is alert, oriented, no adenopathy, well-dressed, normal affect, normal respiratory effort. Focused examination demonstrates pain traced from the lower back into the left buttock down the posterior aspect of his leg as far as his popliteal fossa.  Good dorsiflexion plantarflexion strength.  He does have a positive straight leg raise on the left.  Imaging: No results found. No images are attached to the encounter.  Labs: Lab Results  Component Value Date   HGBA1C 7.6 (H) 03/11/2019   ESRSEDRATE 11 03/11/2019   CRP 1.8 (H) 03/11/2019   REPTSTATUS 07/24/2016 FINAL  07/19/2016   GRAMSTAIN  07/19/2016    RARE WBC PRESENT, PREDOMINANTLY MONONUCLEAR NO ORGANISMS SEEN    CULT NO GROWTH 5 DAYS 07/19/2016     Lab Results  Component Value Date   ALBUMIN 3.7 03/11/2019    No results found for: MG No results found for: VD25OH  No results found for: PREALBUMIN CBC EXTENDED Latest Ref Rng & Units 05/16/2019 03/13/2019 03/12/2019  WBC 4.0 - 10.5 K/uL - 9.5 9.2  RBC 4.22 - 5.81 MIL/uL - 4.43 4.58  HGB 13.0 - 17.0 g/dL 61.9 50.9 32.6  HCT 71.2 - 52.0 % 43.0 42.4 44.0  PLT 150 - 400 K/uL - 197 198  NEUTROABS 1.7 - 7.7 K/uL - - -  LYMPHSABS 0.7 - 4.0 K/uL - - -     There is no height or weight on file to calculate BMI.  Orders:  Orders Placed This Encounter  Procedures  . Ambulatory referral to Physical Medicine Rehab   No orders of the defined types were placed in this encounter.    Procedures: No procedures performed  Clinical Data: No additional findings.  ROS:  All other systems negative, except as noted in the HPI. Review of Systems  Objective: Vital Signs: There were no vitals taken for this visit.  Specialty Comments:  No specialty comments available.  PMFS History: Patient Active  Problem List   Diagnosis Date Noted  . Impingement syndrome of right shoulder   . Nontraumatic complete tear of right rotator cuff   . Osteomyelitis ([place]) 03/11/2019  . Osteomyelitis of fifth toe of left foot ([place])   . Type 2 diabetes mellitus with foot ulcer, without long-term current use of insulin ([place])   . 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 S/P debridement 07/19/2016  . Acute gout of left elbow 07/19/2016   Past Medical History:  Diagnosis Date  . 5 years ago   . AICD (automatic cardioverter/defibrillator) present   . Anxiety   . Arthritis   . CHF (congestive heart failure) ([place])   . Coronary artery disease   . Diabetes mellitus without complication ([place])   . Dysrhythmia   . GERD (gastroesophageal  reflux disease)    PMH  . Hypertension   . Peripheral vascular disease ([place])   . Peripheral vascular disease ([place])   . Pneumonia   . Shoulder impingement, right   . Sleep apnea    wears cpap  . Stomach ulcer   . Wears glasses     Family History  Problem Relation Age of Onset  . Heart disease Mother   . Diabetes Mother   . Peripheral vascular disease Mother     Past Surgical History:  Procedure Laterality Date  . AMPUTATION Left 03/12/2019   Procedure: 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;  Surgeon: [name], MD;  Location: [place];  Service: Orthopedics;  Laterality: Left;  . CARDIAC CATHETERIZATION    . CARDIAC DEFIBRILLATOR PLACEMENT    . I & D EXTREMITY Left 07/19/2016   Procedure: IRRIGATION AND DEBRIDEMENT EXTREMITY/OLECRANON(WASHOUT);  Surgeon: Leandrew Koyanagi, MD;  Location: [place];  Service: Orthopedics;  Laterality: Left;  . OLECRANON BURSECTOMY Left 07/19/2016   Procedure: LEFT ELBOW OLECRANON BURSECTOMY;  Surgeon: Leandrew Koyanagi, MD;  Location: [place];  Service: Orthopedics;  Laterality: Left;  . SHOULDER ARTHROSCOPY Right 05/16/2019   Procedure: Right Shoulder Arthroscopy;  Surgeon: [name], MD;  Location: [place];  Service: Orthopedics;  Laterality: Right;   Social History   Occupational History  . Not on file  Tobacco Use  . Smoking status: Former Smoker    Packs/day: 1.00    Years: 40.00    Pack years: 40.00    Types: Cigarettes    Quit date: 03/11/2019    Years since quitting: 0.4  . Smokeless tobacco: Never Used  Substance and Sexual Activity  . Alcohol use: Not Currently  . Drug use: Yes    Types: Oxycodone  . Sexual activity: Not on file

## 2019-08-06 ENCOUNTER — Other Ambulatory Visit: Payer: Self-pay

## 2019-08-06 ENCOUNTER — Ambulatory Visit: Payer: Medicaid Other

## 2019-08-06 DIAGNOSIS — G8929 Other chronic pain: Secondary | ICD-10-CM

## 2019-08-06 DIAGNOSIS — M25511 Pain in right shoulder: Secondary | ICD-10-CM | POA: Diagnosis not present

## 2019-08-06 DIAGNOSIS — M6281 Muscle weakness (generalized): Secondary | ICD-10-CM | POA: Diagnosis not present

## 2019-08-06 NOTE — Patient Instructions (Signed)
  Green Tband for IR; Red Tband for flexion    Yellow Tband for ER

## 2019-08-07 NOTE — Therapy (Signed)
[place], [place], [address] Phone: [phone]   Fax:  [phone]  Physical Therapy Treatment  Patient Details  Name: Jon Jones MRN: [id number] Date of Birth: 05-02-1955 Referring Provider (PT): Shanon Rosser, [place]   Encounter Date: 08/06/2019  PT End of Session - 08/06/19 1759    Visit Number  8    Number of Visits  16    Date for PT Re-Evaluation  09/05/19    Authorization Type  Medicaid    PT Start Time  8756    PT Stop Time  1448    PT Time Calculation (min)  46 min    Activity Tolerance  Patient tolerated treatment well    Behavior During Therapy  [facility] for tasks assessed/performed       Past Medical History:  Diagnosis Date  . 5 years ago   . AICD (automatic cardioverter/defibrillator) present   . Anxiety   . Arthritis   . CHF (congestive heart failure) ([place])   . Coronary artery disease   . Diabetes mellitus without complication ([place])   . Dysrhythmia   . GERD (gastroesophageal reflux disease)    PMH  . Hypertension   . Peripheral vascular disease ([place])   . Peripheral vascular disease ([place])   . Pneumonia   . Shoulder impingement, right   . Sleep apnea    wears cpap  . Stomach ulcer   . Wears glasses     Past Surgical History:  Procedure Laterality Date  . AMPUTATION Left 03/12/2019   Procedure: 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;  Surgeon: [name], MD;  Location: [place];  Service: Orthopedics;  Laterality: Left;  . CARDIAC CATHETERIZATION    . CARDIAC DEFIBRILLATOR PLACEMENT    . I & D EXTREMITY Left 07/19/2016   Procedure: IRRIGATION AND DEBRIDEMENT EXTREMITY/OLECRANON(WASHOUT);  Surgeon: Leandrew Koyanagi, MD;  Location: [place];  Service: Orthopedics;  Laterality: Left;  . OLECRANON BURSECTOMY Left 07/19/2016   Procedure: LEFT ELBOW OLECRANON BURSECTOMY;  Surgeon: Leandrew Koyanagi, MD;  Location: [place];  Service: Orthopedics;  Laterality: Left;  . SHOULDER ARTHROSCOPY  Right 05/16/2019   Procedure: Right Shoulder Arthroscopy;  Surgeon: [name], MD;  Location: [place];  Service: Orthopedics;  Laterality: Right;    There were no vitals filed for this visit.  Subjective Assessment - 08/06/19 1408    Subjective  Pt denies pain currently. Rates R shoulder pain a 4-5/10 intermittently after HEP/PT and the pain resolves in 1 to 2 hours. Pt denied pain following today's session.    Currently in Pain?  Yes   Intermittently but not today   Pain Location  Shoulder    Pain Orientation  Right    Pain Descriptors / Indicators  Aching    Pain Type  Chronic pain    Pain Radiating Towards  NA    Pain Onset  More than a month ago    Pain Frequency  Intermittent    Aggravating Factors   after exercise    Pain Relieving Factors  rest medication    Effect of Pain on Daily Activities  Affects reaching above shoulder         [facility] PT Assessment - 08/07/19 0001      AROM   Right Shoulder Flexion  155 Degrees    Right Shoulder ABduction  149 Degrees    Right Shoulder External Rotation  0 Degrees   neutral  [facility] Adult PT Treatment/Exercise - 08/07/19 0001      Exercises   Exercises  Shoulder      Shoulder Exercises: Sidelying   External Rotation  Strengthening;Left;10 reps   left hand assist concentric motion for sets 2-3   External Rotation Limitations  3 sets    Other Sidelying Exercises  L; ER eccentric control 10x3; wand assisted the 1st 1/3 of lowering motion      Shoulder Exercises: Standing   External Rotation  Strengthening;Right;10 reps;Theraband    Theraband Level (Shoulder External Rotation)  Level 1 (Yellow)    External Rotation Limitations  3 sets    Internal Rotation  AROM;Strengthening;Right;12 reps    Theraband Level (Shoulder Internal Rotation)  Level 3 (Green)    Internal Rotation Limitations  2 sets    Flexion  AROM;Strengthening;Right;10 reps;Theraband    Theraband Level (Shoulder Flexion)  Level 1  (Yellow)    Flexion Limitations  3 sets    Extension  Strengthening;Theraband;12 reps    Theraband Level (Shoulder Extension)  Level 3 (Green)    Extension Limitations  2 Counselling psychologist;Theraband;12 reps    Theraband Level (Shoulder Row)  Level 3 (Green)    Row Weight (lbs)  2 sets      Shoulder Exercises: ROM/Strengthening   UBE (Upper Arm Bike)  L1; 1 mins each direction x 3 for 6 mins    Wall Wash  10x3;c mini-squat for facilitation               PT Short Term Goals - 08/07/19 1110      PT SHORT TERM GOAL #2   Title  Pt will demonstrate R shoulder ER strength to neutral in L SL. MET    Baseline  -30d less than neutral in L SL    Status  Achieved        PT Long Term Goals - 08/07/19 1111      PT LONG TERM GOAL #1   Title  Improve R shoulder ER strength to 3/5 for improved functional use of the R UE.ON-GOING: 3-/5    Baseline  2/5    Time  8    Period  Weeks    Status  New    Target Date  09/03/19      PT LONG TERM GOAL #2   Title  Improve AROM for R flex to 90d, abd to 100d in standing and ER to 10d in L SL  for improvedfunctional use of the R UE. MET: Flex 155d, abd 150d, ER 0d.    Baseline  Flex 50d, abd 70d, ER -30    Time  8    Period  Weeks    Status  Achieved    Target Date  09/03/19      PT LONG TERM GOAL #3   Title  Pt will report a decrease in R shoulder pain c forward and above head reaching with a range of 0-3/10. PARTIALLY MET: Intermittent 5/10    Baseline  5/10 constant    Time  8    Period  Weeks    Status  Partially Met    Target Date  09/03/19      PT LONG TERM GOAL #4   Title  Pt will be Ind a final HEP to inprove pt's R shoulder AROM, strength and functional use.    Baseline  In process    Time  8    Period  Weeks  Status  New    Target Date  09/03/19      PT LONG TERM GOAL #5   Title  Pt will demonstrate R shoulder flex and abd strength of at least 4/5 strength for improved functional use of the R UE. 08/06/19=  3+/5    Baseline  2/5    Time  8    Period  Weeks    Status  New    Target Date  09/03/19            Plan - 08/06/19 1802    Clinical Impression Statement  AROM and strength of the R shoulder continues to make appropriate gains with improved AROM and general strength of the R shoulder. Strength of the R RTC muscles is weak, but min improved, limited to 3-/5. Pt will benefit from PT to continue to address strength of the R RTC, R shoulder girdle, and R scapular stabilizers muscles for improved functional use of the R UE.    PT Treatment/Interventions  Moist Heat;Therapeutic activities;Therapeutic exercise;Manual techniques;Taping;Dry needling;Passive range of motion;Patient/family education;Electrical Stimulation;Iontophoresis 77m/ml Dexamethasone    PT Next Visit Plan  Request [facility] authorization for 2w6. Contact pt to schedule appts when auth received. Continue PT for R shoulder strengthening to improve function.    PT Home Exercise Plan  [id number].       Patient will benefit from skilled therapeutic intervention in order to improve the following deficits and impairments:  Postural dysfunction, Improper body mechanics, Decreased range of motion, Pain, Impaired UE functional use, Decreased strength, Obesity  Visit Diagnosis: Muscle weakness (generalized)  Chronic pain in right shoulder     Problem List Patient Active Problem List   Diagnosis Date Noted  . Impingement syndrome of right shoulder   . Nontraumatic complete tear of right rotator cuff   . Osteomyelitis ([place] 03/11/2019  . Osteomyelitis of fifth toe of left foot ([facility]   . Type 2 diabetes mellitus with foot ulcer, without long-term current use of insulin ([place]   . 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 S/P debridement 07/19/2016  . Acute gout of left elbow 07/19/2016    AGar PontoMS, PT 08/07/19 11:21 AM  [facility] [address]Phone: [phone]  Fax:  [phone] Name: ODewey ViensMRN: [id number] of Birth: 6December 22, 1957

## 2019-08-12 ENCOUNTER — Ambulatory Visit: Payer: Medicaid Other

## 2019-08-14 ENCOUNTER — Other Ambulatory Visit: Payer: Self-pay

## 2019-08-14 ENCOUNTER — Ambulatory Visit: Payer: Medicaid Other | Attending: Physician Assistant

## 2019-08-14 DIAGNOSIS — M25511 Pain in right shoulder: Secondary | ICD-10-CM | POA: Insufficient documentation

## 2019-08-14 DIAGNOSIS — M6281 Muscle weakness (generalized): Secondary | ICD-10-CM | POA: Diagnosis not present

## 2019-08-14 DIAGNOSIS — G8929 Other chronic pain: Secondary | ICD-10-CM | POA: Insufficient documentation

## 2019-08-14 NOTE — Therapy (Signed)
[place], [place], [address] Phone: [phone]   Fax:  [phone]  Physical Therapy Treatment  Patient Details  Name: Jon Jones MRN: [id number] Date of Birth: Feb 24, 1956 Referring Provider (PT): Shanon Rosser, [place]   Encounter Date: 08/14/2019  PT End of Session - 08/14/19 1738    Visit Number  9    Number of Visits  20    Date for PT Re-Evaluation  09/19/19    Authorization Type  Medicaid    PT Start Time  1400    PT Stop Time  1445    PT Time Calculation (min)  45 min    Activity Tolerance  Patient tolerated treatment well    Behavior During Therapy  [facility] for tasks assessed/performed       Past Medical History:  Diagnosis Date  . 5 years ago   . AICD (automatic cardioverter/defibrillator) present   . Anxiety   . Arthritis   . CHF (congestive heart failure) ([place])   . Coronary artery disease   . Diabetes mellitus without complication ([place])   . Dysrhythmia   . GERD (gastroesophageal reflux disease)    PMH  . Hypertension   . Peripheral vascular disease ([place])   . Peripheral vascular disease ([place])   . Pneumonia   . Shoulder impingement, right   . Sleep apnea    wears cpap  . Stomach ulcer   . Wears glasses     Past Surgical History:  Procedure Laterality Date  . AMPUTATION Left 03/12/2019   Procedure: 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;  Surgeon: [name], MD;  Location: [place];  Service: Orthopedics;  Laterality: Left;  . CARDIAC CATHETERIZATION    . CARDIAC DEFIBRILLATOR PLACEMENT    . I & D EXTREMITY Left 07/19/2016   Procedure: IRRIGATION AND DEBRIDEMENT EXTREMITY/OLECRANON(WASHOUT);  Surgeon: Leandrew Koyanagi, MD;  Location: [place];  Service: Orthopedics;  Laterality: Left;  . OLECRANON BURSECTOMY Left 07/19/2016   Procedure: LEFT ELBOW OLECRANON BURSECTOMY;  Surgeon: Leandrew Koyanagi, MD;  Location: [place];  Service: Orthopedics;  Laterality: Left;  . SHOULDER ARTHROSCOPY  Right 05/16/2019   Procedure: Right Shoulder Arthroscopy;  Surgeon: [name], MD;  Location: [place];  Service: Orthopedics;  Laterality: Right;    There were no vitals filed for this visit.  Subjective Assessment - 08/14/19 1729    Subjective  Pt reports he is having R shoulder pain at a 3/10 level.    Currently in Pain?  Yes    Pain Score  3     Pain Location  Shoulder    Pain Orientation  Lateral;Right    Pain Descriptors / Indicators  Aching    Pain Type  Chronic pain    Pain Radiating Towards  NA    Pain Onset  More than a month ago    Pain Frequency  Intermittent    Aggravating Factors   agter exs    Pain Relieving Factors  rest, meds    Effect of Pain on Daily Activities  Pain can limited use of the R arm                        [facility] Adult PT Treatment/Exercise - 08/14/19 0001      Exercises   Exercises  Shoulder      Shoulder Exercises: Sidelying   External Rotation  Strengthening;Left;10 reps   left hand assist concentric motion for  sets 2-3   External Rotation Limitations  3 sets    Other Sidelying Exercises  Rt; ER eccentric control 10x3; PT assisted the 1st 1/3 of lowering motion    Other Sidelying Exercises  Supraspinatus arm lifts 10x3      Shoulder Exercises: Standing   External Rotation  Strengthening;Right;10 reps;Theraband    Theraband Level (Shoulder External Rotation)  Level 1 (Yellow)    External Rotation Limitations  3 sets    Internal Rotation  AROM;Strengthening;Right;12 reps    Theraband Level (Shoulder Internal Rotation)  Level 3 (Green)    Internal Rotation Limitations  3 sets    Flexion  Strengthening;Right;10 reps;Theraband    Theraband Level (Shoulder Flexion)  Level 1 (Yellow)    Flexion Limitations  3 sets    Extension  Strengthening;Theraband;Left;10 reps    Theraband Level (Shoulder Extension)  Level 3 (Green)    Extension Limitations  3 Counselling psychologist;Theraband;Right;10 reps    Theraband Level (Shoulder  Row)  Level 3 (Green)    Row Weight (lbs)  3 sets      Shoulder Exercises: ROM/Strengthening   UBE (Upper Arm Bike)  L1; 1 mins each direction x 3 for 6 mins               PT Short Term Goals - 08/07/19 1110      PT SHORT TERM GOAL #2   Title  Pt will demonstrate R shoulder ER strength to neutral in L SL. MET    Baseline  -30d less than neutral in L SL    Status  Achieved        PT Long Term Goals - 08/07/19 1111      PT LONG TERM GOAL #1   Title  Improve R shoulder ER strength to 3/5 for improved functional use of the R UE.ON-GOING: 3-/5    Baseline  2/5    Time  8    Period  Weeks    Status  New    Target Date  09/03/19      PT LONG TERM GOAL #2   Title  Improve AROM for R flex to 90d, abd to 100d in standing and ER to 10d in L SL  for improvedfunctional use of the R UE. MET: Flex 155d, abd 150d, ER 0d.    Baseline  Flex 50d, abd 70d, ER -30    Time  8    Period  Weeks    Status  Achieved    Target Date  09/03/19      PT LONG TERM GOAL #3   Title  Pt will report a decrease in R shoulder pain c forward and above head reaching with a range of 0-3/10. PARTIALLY MET: Intermittent 5/10    Baseline  5/10 constant    Time  8    Period  Weeks    Status  Partially Met    Target Date  09/03/19      PT LONG TERM GOAL #4   Title  Pt will be Ind a final HEP to inprove pt's R shoulder AROM, strength and functional use.    Baseline  In process    Time  8    Period  Weeks    Status  New    Target Date  09/03/19      PT LONG TERM GOAL #5   Title  Pt will demonstrate R shoulder flex and abd strength of at least 4/5 strength for improved  functional use of the R UE. 08/06/19= 3+/5    Baseline  2/5    Time  8    Period  Weeks    Status  New    Target Date  09/03/19            Plan - 08/14/19 1743    Clinical Impression Statement  Pt participated in PT to improve R shoulder strength, ROM and function. R ER in L SL is limited to reaching neutral. Pt is able to  actively raise his R UE with a straight elbow (long lever). Pt reported a lower level of R shoulder pain prior to today's session which did not increase or decrease with today's session.    Personal Factors and Comorbidities  Comorbidity 3+    Comorbidities  CHF,anxiety, DM    Examination-Activity Limitations  Carry;Lift;Reach Overhead;Self Feeding    Stability/Clinical Decision Making  Evolving/Moderate complexity    Clinical Decision Making  Moderate    Rehab Potential  Good    PT Frequency  2x / week    PT Duration  6 weeks    PT Treatment/Interventions  Moist Heat;Therapeutic activities;Therapeutic exercise;Manual techniques;Taping;Dry needling;Passive range of motion;Patient/family education;Electrical Stimulation;Iontophoresis 96m/ml Dexamethasone    PT Next Visit Plan  Continue R shoulder strengthening c scapular stabilization exs. Modalities for pain management as needed.    PT Home Exercise Plan  [phone]      Patient will benefit from skilled therapeutic intervention in order to improve the following deficits and impairments:  Postural dysfunction, Improper body mechanics, Decreased range of motion, Pain, Impaired UE functional use, Decreased strength, Obesity  Visit Diagnosis: Muscle weakness (generalized)  Chronic pain in right shoulder     Problem List Patient Active Problem List   Diagnosis Date Noted  . Impingement syndrome of right shoulder   . Nontraumatic complete tear of right rotator cuff   . Osteomyelitis ([place] 03/11/2019  . Osteomyelitis of fifth toe of left foot ([place]   . Type 2 diabetes mellitus with foot ulcer, without long-term current use of insulin ([facility]   . 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 S/P debridement 07/19/2016  . Acute gout of left elbow 07/19/2016    AGar PontoMS, PT 08/14/19 5:55 PM  [facility] [address]Phone:  [phone]  Fax:  [phone] Name: OEdword CuMRN: [id number] of Birth: [birth year]

## 2019-08-15 DIAGNOSIS — G4733 Obstructive sleep apnea (adult) (pediatric): Secondary | ICD-10-CM | POA: Diagnosis not present

## 2019-08-19 ENCOUNTER — Ambulatory Visit: Payer: Medicaid Other

## 2019-08-19 ENCOUNTER — Other Ambulatory Visit: Payer: Self-pay

## 2019-08-19 DIAGNOSIS — M25511 Pain in right shoulder: Secondary | ICD-10-CM | POA: Diagnosis not present

## 2019-08-19 DIAGNOSIS — M6281 Muscle weakness (generalized): Secondary | ICD-10-CM | POA: Diagnosis not present

## 2019-08-19 DIAGNOSIS — G8929 Other chronic pain: Secondary | ICD-10-CM

## 2019-08-19 NOTE — Therapy (Signed)
[place], [place], [address] Phone: [phone]   Fax:  [phone]  Physical Therapy Treatment/Progress Note Progress Note  Reporting Period  to 07/08/19-08/19/19  See note below for Objective Data and Assessment of Progress/Goals.       Patient Details  Name: Jon Jones MRN: [id number] Date of Birth: Apr 02, 1955 Referring Provider (PT): Shanon Rosser, [place]   Encounter Date: 08/19/2019  PT End of Session - 08/19/19 1443    Visit Number  10    Number of Visits  10    Date for PT Re-Evaluation  09/19/19    Authorization Type  Medicaid    PT Start Time  7680    PT Stop Time  8811    PT Time Calculation (min)  41 min    Activity Tolerance  Patient tolerated treatment well    Behavior During Therapy  [facility] for tasks assessed/performed       Past Medical History:  Diagnosis Date  . 5 years ago   . AICD (automatic cardioverter/defibrillator) present   . Anxiety   . Arthritis   . CHF (congestive heart failure) ([place])   . Coronary artery disease   . Diabetes mellitus without complication ([place])   . Dysrhythmia   . GERD (gastroesophageal reflux disease)    PMH  . Hypertension   . Peripheral vascular disease ([place])   . Peripheral vascular disease ([place])   . Pneumonia   . Shoulder impingement, right   . Sleep apnea    wears cpap  . Stomach ulcer   . Wears glasses     Past Surgical History:  Procedure Laterality Date  . AMPUTATION Left 03/12/2019   Procedure: 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;  Surgeon: [name], MD;  Location: [place];  Service: Orthopedics;  Laterality: Left;  . CARDIAC CATHETERIZATION    . CARDIAC DEFIBRILLATOR PLACEMENT    . I & D EXTREMITY Left 07/19/2016   Procedure: IRRIGATION AND DEBRIDEMENT EXTREMITY/OLECRANON(WASHOUT);  Surgeon: Leandrew Koyanagi, MD;  Location: [place];  Service: Orthopedics;  Laterality: Left;  . OLECRANON BURSECTOMY Left 07/19/2016   Procedure: LEFT ELBOW OLECRANON  BURSECTOMY;  Surgeon: Leandrew Koyanagi, MD;  Location: [place];  Service: Orthopedics;  Laterality: Left;  . SHOULDER ARTHROSCOPY Right 05/16/2019   Procedure: Right Shoulder Arthroscopy;  Surgeon: [name], MD;  Location: [place];  Service: Orthopedics;  Laterality: Right;    There were no vitals filed for this visit.  Subjective Assessment - 08/19/19 1409    Subjective  Pt reports he is not having any R shoulder pain, and has not for the past 3 days. He states he has been taking ibuprofen 656m 2x a day for his chronic back pain and shoulder.    Currently in Pain?  Yes    Pain Score  0-No pain    Pain Location  Shoulder    Pain Orientation  Right    Pain Descriptors / Indicators  Aching    Pain Type  Chronic pain    Pain Onset  More than a month ago         [facility]PT [facility] - 08/19/19 0001      AROM   Right Shoulder Flexion  153 Degrees    Right Shoulder ABduction  150 Degrees                    [facility] Adult PT Treatment/Exercise - 08/19/19 0001  Exercises   Exercises  Shoulder      Shoulder Exercises: Standing   External Rotation  Strengthening;Right;10 reps;Theraband    Theraband Level (Shoulder External Rotation)  Level 1 (Yellow)    External Rotation Limitations  3 sets    Internal Rotation  AROM;Strengthening;Right;12 reps    Theraband Level (Shoulder Internal Rotation)  Level 3 (Green)    Internal Rotation Limitations  3 sets    Flexion  Strengthening;Right;10 reps;Weights    Shoulder Flexion Weight (lbs)  1 lb    Flexion Limitations  3 sets    ABduction  Strengthening;Right;10 reps;Weights    Shoulder ABduction Weight (lbs)  1 lb    Extension  Strengthening;Theraband;Left;10 reps    Theraband Level (Shoulder Extension)  Level 3 (Green)    Extension Limitations  3 Counselling psychologist;Theraband;Right;10 reps    Theraband Level (Shoulder Row)  Level 3 (Green)    Row Weight (lbs)  3 sets      Shoulder Exercises:  ROM/Strengthening   UBE (Upper Arm Bike)  L1; 1 mins each direction x 3 for 6 mins    Wall Wash  10x2 c mini-squat for facilitation    Wall Pushups  10 reps    Wall Pushups Limitations  3 sets; R UE               PT Short Term Goals - 08/07/19 1110      PT SHORT TERM GOAL #2   Title  Pt will demonstrate R shoulder ER strength to neutral in L SL. MET    Baseline  -30d less than neutral in L SL    Status  Achieved        PT Long Term Goals - 08/07/19 1111      PT LONG TERM GOAL #1   Title  Improve R shoulder ER strength to 3/5 for improved functional use of the R UE.ON-GOING: 3-/5    Baseline  2/5    Time  8    Period  Weeks    Status  New    Target Date  09/03/19      PT LONG TERM GOAL #2   Title  Improve AROM for R flex to 90d, abd to 100d in standing and ER to 10d in L SL  for improvedfunctional use of the R UE. MET: Flex 155d, abd 150d, ER 0d.    Baseline  Flex 50d, abd 70d, ER -30    Time  8    Period  Weeks    Status  Achieved    Target Date  09/03/19      PT LONG TERM GOAL #3   Title  Pt will report a decrease in R shoulder pain c forward and above head reaching with a range of 0-3/10. PARTIALLY MET: Intermittent 5/10    Baseline  5/10 constant    Time  8    Period  Weeks    Status  Partially Met    Target Date  09/03/19      PT LONG TERM GOAL #4   Title  Pt will be Ind a final HEP to inprove pt's R shoulder AROM, strength and functional use.    Baseline  In process    Time  8    Period  Weeks    Status  New    Target Date  09/03/19      PT LONG TERM GOAL #5   Title  Pt will demonstrate R shoulder  flex and abd strength of at least 4/5 strength for improved functional use of the R UE. 08/06/19= 3+/5    Baseline  2/5    Time  8    Period  Weeks    Status  New    Target Date  09/03/19            Plan - 08/19/19 1853    Clinical Impression Statement  Pts R shoulder pain is improved, however pt is taking 673m of ibuprofen in the AM and PM.  Pt demonstrates functional AROM of the R shoulder, but is not able to manage more than a couple lbs.    Personal Factors and Comorbidities  Comorbidity 3+    Comorbidities  CHF,anxiety, DM    Examination-Activity Limitations  Carry;Lift;Reach Overhead;Self Feeding    Stability/Clinical Decision Making  Evolving/Moderate complexity    Clinical Decision Making  Moderate    Rehab Potential  Good    PT Frequency  2x / week    PT Duration  6 weeks    PT Treatment/Interventions  Moist Heat;Therapeutic activities;Therapeutic exercise;Manual techniques;Taping;Dry needling;Passive range of motion;Patient/family education;Electrical Stimulation;Iontophoresis 449mml Dexamethasone    PT Next Visit Plan  Continue R shoulder strengthening c scapular stabilization exs. Use modalities for pain management as needed.    PT Home Exercise Plan  [phone]     Patient will benefit from skilled therapeutic intervention in order to improve the following deficits and impairments:  Postural dysfunction, Improper body mechanics, Decreased range of motion, Pain, Impaired UE functional use, Decreased strength, Obesity  Visit Diagnosis: Muscle weakness (generalized)  Chronic pain in right shoulder     Problem List Patient Active Problem List   Diagnosis Date Noted  . Impingement syndrome of right shoulder   . Nontraumatic complete tear of right rotator cuff   . Osteomyelitis (HCLexington12/29/2020  . Osteomyelitis of fifth toe of left foot (HCBre[facility]  . Type 2 diabetes mellitus with foot ulcer, without long-term current use of insulin (HCP[facility]  . 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 S/P debridement 07/19/2016  . Acute gout of left elbow 07/19/2016    AlGar PontoS, PT 08/19/19 7:02 PM  [facility]Bridge[place]hone: [phone] Fax:  [phone]: OmRumaldo DifattaRN: [id number] of Birth:  08/1955/07/02

## 2019-08-21 ENCOUNTER — Other Ambulatory Visit: Payer: Self-pay

## 2019-08-21 ENCOUNTER — Ambulatory Visit: Payer: Medicaid Other

## 2019-08-21 DIAGNOSIS — G8929 Other chronic pain: Secondary | ICD-10-CM

## 2019-08-21 DIAGNOSIS — M6281 Muscle weakness (generalized): Secondary | ICD-10-CM

## 2019-08-21 DIAGNOSIS — M25511 Pain in right shoulder: Secondary | ICD-10-CM | POA: Diagnosis not present

## 2019-08-22 NOTE — Therapy (Signed)
[place], [place], [address] Phone: [phone]   Fax:  [phone]  Physical Therapy Treatment  Patient Details  Name: Jon Jones MRN: [id number] Date of Birth: 1955-07-13 Referring Provider (PT): Shanon Rosser, [place]   Encounter Date: 08/21/2019   PT End of Session - 08/22/19 0619    Visit Number 11    Number of Visits 20    Date for PT Re-Evaluation 09/19/19    Authorization Type Medicaid    PT Start Time 1400    PT Stop Time 1444    PT Time Calculation (min) 44 min    Activity Tolerance Patient tolerated treatment well    Behavior During Therapy [facility] for tasks assessed/performed           Past Medical History:  Diagnosis Date  . 5 years ago   . AICD (automatic cardioverter/defibrillator) present   . Anxiety   . Arthritis   . CHF (congestive heart failure) ([place])   . Coronary artery disease   . Diabetes mellitus without complication ([place])   . Dysrhythmia   . GERD (gastroesophageal reflux disease)    PMH  . Hypertension   . Peripheral vascular disease ([place])   . Peripheral vascular disease ([place])   . Pneumonia   . Shoulder impingement, right   . Sleep apnea    wears cpap  . Stomach ulcer   . Wears glasses     Past Surgical History:  Procedure Laterality Date  . AMPUTATION Left 03/12/2019   Procedure: 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;  Surgeon: [name], MD;  Location: [place];  Service: Orthopedics;  Laterality: Left;  . CARDIAC CATHETERIZATION    . CARDIAC DEFIBRILLATOR PLACEMENT    . I & D EXTREMITY Left 07/19/2016   Procedure: IRRIGATION AND DEBRIDEMENT EXTREMITY/OLECRANON(WASHOUT);  Surgeon: Leandrew Koyanagi, MD;  Location: [place];  Service: Orthopedics;  Laterality: Left;  . OLECRANON BURSECTOMY Left 07/19/2016   Procedure: LEFT ELBOW OLECRANON BURSECTOMY;  Surgeon: Leandrew Koyanagi, MD;  Location: [place];  Service: Orthopedics;  Laterality: Left;  . SHOULDER ARTHROSCOPY  Right 05/16/2019   Procedure: Right Shoulder Arthroscopy;  Surgeon: [name], MD;  Location: [place];  Service: Orthopedics;  Laterality: Right;    There were no vitals filed for this visit.   Subjective Assessment - 08/21/19 1423    Subjective Pt reports no R shoulder pain. Pt states he is taking ibuprofen for pain. Pt states he has not consulted any of his MDs re: the use of ibuprofen.    Pain Score 0-No pain    Pain Location Shoulder    Pain Orientation Right                             [facility] Adult PT Treatment/Exercise - 08/22/19 0001      Exercises   Exercises Shoulder      Shoulder Exercises: Supine   Protraction Strengthening;Both;10 reps;Weights    Protraction Weight (lbs) 5#    Protraction Limitations arm ext to protraction; wand; 3 sets    Horizontal ABduction Strengthening;Both;10 reps;Theraband    Theraband Level (Shoulder Horizontal ABduction) Level 2 (Red)    Horizontal ABduction Limitations 3 sets      Shoulder Exercises: Standing   External Rotation --    Theraband Level (Shoulder External Rotation) --    External Rotation Limitations --    Internal Rotation --  Theraband Level (Shoulder Internal Rotation) --    Internal Rotation Limitations 3 sets    Flexion Strengthening;Right;10 reps;Weights    Shoulder Flexion Weight (lbs) 1 lb    Flexion Limitations 3 sets; to 90d    ABduction --    Shoulder ABduction Weight (lbs) --    Herbalist;Theraband;Left;10 reps    Theraband Level (Shoulder Extension) Level 3 (Green)    Extension Limitations --    Row --    Theraband Level (Shoulder Row) --    Row Weight (lbs) --    Other Standing Exercises Lat. Dorsi pull 10x3; green Tband      Shoulder Exercises: ROM/Strengthening   UBE (Upper Arm Bike) L1; 1 mins each direction x 3 for 6 mins    Wall Wash 10x2 c mini-squat for facilitation    Wall Pushups --    Wall Pushups Limitations --                  PT Education -  08/21/19 1429    Education Details [id number] not being recommended for use with pt's who have heart disease.    Person(s) Educated Patient    Methods Explanation    Comprehension Verbalized understanding            PT Short Term Goals - 08/07/19 1110      PT SHORT TERM GOAL #2   Title Pt will demonstrate R shoulder ER strength to neutral in L SL. MET    Baseline -30d less than neutral in L SL    Status Achieved             PT Long Term Goals - 08/07/19 1111      PT LONG TERM GOAL #1   Title Improve R shoulder ER strength to 3/5 for improved functional use of the R UE.ON-GOING: 3-/5    Baseline 2/5    Time 8    Period Weeks    Status New    Target Date 09/03/19      PT LONG TERM GOAL #2   Title Improve AROM for R flex to 90d, abd to 100d in standing and ER to 10d in L SL  for improvedfunctional use of the R UE. MET: Flex 155d, abd 150d, ER 0d.    Baseline Flex 50d, abd 70d, ER -30    Time 8    Period Weeks    Status Achieved    Target Date 09/03/19      PT LONG TERM GOAL #3   Title Pt will report a decrease in R shoulder pain c forward and above head reaching with a range of 0-3/10. PARTIALLY MET: Intermittent 5/10    Baseline 5/10 constant    Time 8    Period Weeks    Status Partially Met    Target Date 09/03/19      PT LONG TERM GOAL #4   Title Pt will be Ind a final HEP to inprove pt's R shoulder AROM, strength and functional use.    Baseline In process    Time 8    Period Weeks    Status New    Target Date 09/03/19      PT LONG TERM GOAL #5   Title Pt will demonstrate R shoulder flex and abd strength of at least 4/5 strength for improved functional use of the R UE. 08/06/19= 3+/5    Baseline 2/5    Time 8    Period Weeks    Status New  Target Date 09/03/19                 Plan - 08/21/19 1442    Clinical Impression Statement Following education re: ibuprofen not being recommended for pt's with heart disease, pt stated he would DC it's  use and use tylenol for pain management. Pt continues to be directed at [facility] with strengthening of the RT and shoulder girdle muscles as tolerated. pt participates in with good effort.    Personal Factors and Comorbidities Comorbidity 3+    Comorbidities CHF,anxiety, DM    Examination-Activity Limitations Carry;Lift;Reach Overhead;Self Feeding    Stability/Clinical Decision Making Evolving/Moderate complexity    Clinical Decision Making Moderate    Rehab Potential Good    PT Frequency 2x / week    PT Duration 6 weeks    PT Treatment/Interventions Moist Heat;Therapeutic activities;Therapeutic exercise;Manual techniques;Taping;Dry needling;Passive range of motion;Patient/family education;Electrical Stimulation;Iontophoresis 69m/ml Dexamethasone    PT Next Visit Plan Continue R shoulder strengthening for the R RC and shoulder girdle.    PT Home Exercise Plan [phone]          Patient will benefit from skilled therapeutic intervention in order to improve the following deficits and impairments:  Postural dysfunction, Improper body mechanics, Decreased range of motion, Pain, Impaired UE functional use, Decreased strength, Obesity  Visit Diagnosis: Muscle weakness (generalized)  Chronic pain in right shoulder     Problem List Patient Active Problem List   Diagnosis Date Noted  . Impingement syndrome of right shoulder   . Nontraumatic complete tear of right rotator cuff   . Osteomyelitis ([facility] 03/11/2019  . Osteomyelitis of fifth toe of left foot (HC[place]   . Type 2 diabetes mellitus with foot ulcer, without long-term current use of insulin ([place]   . 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 S/P debridement 07/19/2016  . Acute gout of left elbow 07/19/2016    AGar PontoMS, PT 08/22/19 6:27 AM  [facility] Center[address] StreetGRochester N[place]aska [address]Phone: [phone]  Fax:  [phone] Name: Jon WestallMRN: [id number] of Birth: 6Mar 20, 1957

## 2019-08-25 ENCOUNTER — Telehealth: Payer: Self-pay | Admitting: Physical Therapy

## 2019-08-25 ENCOUNTER — Other Ambulatory Visit: Payer: Self-pay | Admitting: Orthopedic Surgery

## 2019-08-25 NOTE — Telephone Encounter (Signed)
Do you wish to refill? 

## 2019-08-25 NOTE — Telephone Encounter (Signed)
Spoke with patient to get his schedule "straightened out". Able to schedule 9 visits at 2x/week frequency through 7/13. Will have print outs of both past appointments and future appointments available to him at his appointment scheduled tomorrow at 2pm.   Percell Boston, PT, DPT 08/25/19 11:39 AM Phone: [phone] Fax: [phone]

## 2019-08-26 ENCOUNTER — Other Ambulatory Visit: Payer: Self-pay

## 2019-08-26 ENCOUNTER — Encounter: Payer: Self-pay | Admitting: Physical Therapy

## 2019-08-26 ENCOUNTER — Ambulatory Visit: Payer: Medicaid Other | Admitting: Physical Therapy

## 2019-08-26 DIAGNOSIS — M25511 Pain in right shoulder: Secondary | ICD-10-CM

## 2019-08-26 DIAGNOSIS — G8929 Other chronic pain: Secondary | ICD-10-CM

## 2019-08-26 DIAGNOSIS — M6281 Muscle weakness (generalized): Secondary | ICD-10-CM | POA: Diagnosis not present

## 2019-08-26 NOTE — Therapy (Signed)
[place], [place], [address] Phone: [phone]   Fax:  [phone]  Physical Therapy Treatment  Patient Details  Name: Jon Jones MRN: [id number] Date of Birth: [birth year] Referring Provider (PT): Shanon Rosser, [place]   Encounter Date: 08/26/2019   PT End of Session - 08/26/19 1406    Visit Number 12    Number of Visits 20    Date for PT Re-Evaluation 09/19/19    Authorization Type Medicaid    Authorization Time Period 08/13/19-09/23/19    Authorization - Visit Number 4    Authorization - Number of Visits 12    PT Start Time 1400    PT Stop Time 4888    PT Time Calculation (min) 45 min           Past Medical History:  Diagnosis Date  . 5 years ago   . AICD (automatic cardioverter/defibrillator) present   . Anxiety   . Arthritis   . CHF (congestive heart failure) ([place])   . Coronary artery disease   . Diabetes mellitus without complication ([place])   . Dysrhythmia   . GERD (gastroesophageal reflux disease)    PMH  . Hypertension   . Peripheral vascular disease ([place])   . Peripheral vascular disease ([place])   . Pneumonia   . Shoulder impingement, right   . Sleep apnea    wears cpap  . Stomach ulcer   . Wears glasses     Past Surgical History:  Procedure Laterality Date  . AMPUTATION Left 03/12/2019   Procedure: 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;  Surgeon: [name], MD;  Location: [facility];  Service: Orthopedics;  Laterality: Left;  . CARDIAC CATHETERIZATION    . CARDIAC DEFIBRILLATOR PLACEMENT    . I & D EXTREMITY Left 07/19/2016   Procedure: IRRIGATION AND DEBRIDEMENT EXTREMITY/OLECRANON(WASHOUT);  Surgeon: Leandrew Koyanagi, MD;  Location: [facility];  Service: Orthopedics;  Laterality: Left;  . OLECRANON BURSECTOMY Left 07/19/2016   Procedure: LEFT ELBOW OLECRANON BURSECTOMY;  Surgeon: Leandrew Koyanagi, MD;  Location: [facility];  Service: Orthopedics;  Laterality: Left;  . SHOULDER ARTHROSCOPY  Right 05/16/2019   Procedure: Right Shoulder Arthroscopy;  Surgeon: [name], MD;  Location: [facility];  Service: Orthopedics;  Laterality: Right;    There were no vitals filed for this visit.   Subjective Assessment - 08/26/19 1405    Subjective Now I can raise my arm above my head and I can put my hands behind my head.    Currently in Pain? No/denies                             [facility] Adult PT Treatment/Exercise - 08/26/19 0001      Shoulder Exercises: Seated   Other Seated Exercises seated - using tray table to prop elbo at different degrees of abduction working on [facility] ER with eccentric lowering, elbow at side- towel slides for IR/ER, 1# on towl , assisted bicep curls while assist to maintin ER AROM, AAROM and 1# eccentric lowering       Shoulder Exercises: Standing   External Rotation Strengthening;Right;10 reps;Theraband    Theraband Level (Shoulder External Rotation) Level 1 (Yellow)    External Rotation Limitations 3 sets    Internal Rotation AROM;Strengthening;Right;12 reps    Theraband Level (Shoulder Internal Rotation) Level 1 (Yellow)    Internal Rotation Limitations 3 sets    Flexion Strengthening;Right;10 reps;Weights  Shoulder Flexion Weight (lbs) 1 lb    Flexion Limitations 3 sets    ABduction Strengthening;Right;10 reps;Weights    Shoulder ABduction Weight (lbs) 1 lb    Extension Strengthening;Theraband;Left;10 reps    Theraband Level (Shoulder Extension) Level 3 (Green)    Extension Limitations 3 Dealer;Theraband;Right;10 reps    Theraband Level (Shoulder Row) Level 3 (Green)    Row Weight (lbs) 3 sets      Shoulder Exercises: ROM/Strengthening   UBE (Upper Arm Bike) L1; 1 mins each direction x 3 for 6 mins    Wall Wash 10x2 c mini-squat for facilitation    Other ROM/Strengthening Exercises AAROM ER standing with cane -seat belt to keep upper arm at side                     PT Short Term Goals - 08/07/19  1110      PT SHORT TERM GOAL #2   Title Pt will demonstrate R shoulder ER strength to neutral in L SL. MET    Baseline -30d less than neutral in L SL    Status Achieved             PT Long Term Goals - 08/07/19 1111      PT LONG TERM GOAL #1   Title Improve R shoulder ER strength to 3/5 for improved functional use of the R UE.ON-GOING: 3-/5    Baseline 2/5    Time 8    Period Weeks    Status New    Target Date 09/03/19      PT LONG TERM GOAL #2   Title Improve AROM for R flex to 90d, abd to 100d in standing and ER to 10d in L SL  for improvedfunctional use of the R UE. MET: Flex 155d, abd 150d, ER 0d.    Baseline Flex 50d, abd 70d, ER -30    Time 8    Period Weeks    Status Achieved    Target Date 09/03/19      PT LONG TERM GOAL #3   Title Pt will report a decrease in R shoulder pain c forward and above head reaching with a range of 0-3/10. PARTIALLY MET: Intermittent 5/10    Baseline 5/10 constant    Time 8    Period Weeks    Status Partially Met    Target Date 09/03/19      PT LONG TERM GOAL #4   Title Pt will be Ind a final HEP to inprove pt's R shoulder AROM, strength and functional use.    Baseline In process    Time 8    Period Weeks    Status New    Target Date 09/03/19      PT LONG TERM GOAL #5   Title Pt will demonstrate R shoulder flex and abd strength of at least 4/5 strength for improved functional use of the R UE. 08/06/19= 3+/5    Baseline 2/5    Time 8    Period Weeks    Status New    Target Date 09/03/19                 Plan - 08/26/19 1503    Clinical Impression Statement Pt reports continued difficulty with ER AROM. Worked on scap stab. Also worked on ER AROM and AAROM in different elevations of abduction., He did well with elbow supported at 80 degrees abduction. He was able to control descent  to neutral.    PT Next Visit Plan Continue R shoulder strengthening for the R RC and shoulder girdle.    PT Home Exercise Plan [phone]            Patient will benefit from skilled therapeutic intervention in order to improve the following deficits and impairments:  Postural dysfunction, Improper body mechanics, Decreased range of motion, Pain, Impaired UE functional use, Decreased strength, Obesity  Visit Diagnosis: Muscle weakness (generalized)  Chronic pain in right shoulder     Problem List Patient Active Problem List   Diagnosis Date Noted  . Impingement syndrome of right shoulder   . Nontraumatic complete tear of right rotator cuff   . Osteomyelitis ([place]) 03/11/2019  . Osteomyelitis of fifth toe of left foot ([place])   . Type 2 diabetes mellitus with foot ulcer, without long-term current use of insulin ([place])   . 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 S/P debridement 07/19/2016  . Acute gout of left elbow 07/19/2016    Dorene Ar , PTA 08/26/2019, 3:07 PM  [facility] [address] [place], [place], [address] Phone: [phone]   Fax:  [phone]  Name: Polk Minor MRN: [id number] Date of Birth: [birth year]

## 2019-08-27 ENCOUNTER — Ambulatory Visit: Payer: Medicaid Other | Admitting: Physical Therapy

## 2019-08-27 DIAGNOSIS — G8929 Other chronic pain: Secondary | ICD-10-CM | POA: Diagnosis not present

## 2019-08-27 DIAGNOSIS — M6281 Muscle weakness (generalized): Secondary | ICD-10-CM

## 2019-08-27 DIAGNOSIS — M25511 Pain in right shoulder: Secondary | ICD-10-CM | POA: Diagnosis not present

## 2019-08-27 NOTE — Therapy (Signed)
[place], [place], [address] Phone: [phone]   Fax:  [phone]  Physical Therapy Treatment  Patient Details  Name: Jon Jones MRN: [id number] Date of Birth: 06-11-1955 Referring Provider (PT): Shanon Rosser, [place]   Encounter Date: 08/27/2019   PT End of Session - 08/27/19 1433    Visit Number 13    Number of Visits 20    Date for PT Re-Evaluation 09/19/19    Authorization Type Medicaid    Authorization Time Period 08/13/19-09/23/19    Authorization - Visit Number 5    Authorization - Number of Visits 12    PT Start Time 0277    PT Stop Time 1440    PT Time Calculation (min) 45 min           Past Medical History:  Diagnosis Date  . 5 years ago   . AICD (automatic cardioverter/defibrillator) present   . Anxiety   . Arthritis   . CHF (congestive heart failure) ([place])   . Coronary artery disease   . Diabetes mellitus without complication ([place])   . Dysrhythmia   . GERD (gastroesophageal reflux disease)    PMH  . Hypertension   . Peripheral vascular disease ([place])   . Peripheral vascular disease ([place])   . Pneumonia   . Shoulder impingement, right   . Sleep apnea    wears cpap  . Stomach ulcer   . Wears glasses     Past Surgical History:  Procedure Laterality Date  . AMPUTATION Left 03/12/2019   Procedure: 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;  Surgeon: [name], MD;  Location: [facility];  Service: Orthopedics;  Laterality: Left;  . CARDIAC CATHETERIZATION    . CARDIAC DEFIBRILLATOR PLACEMENT    . I & D EXTREMITY Left 07/19/2016   Procedure: IRRIGATION AND DEBRIDEMENT EXTREMITY/OLECRANON(WASHOUT);  Surgeon: Leandrew Koyanagi, MD;  Location: [facility];  Service: Orthopedics;  Laterality: Left;  . OLECRANON BURSECTOMY Left 07/19/2016   Procedure: LEFT ELBOW OLECRANON BURSECTOMY;  Surgeon: Leandrew Koyanagi, MD;  Location: [facility];  Service: Orthopedics;  Laterality: Left;  . SHOULDER ARTHROSCOPY  Right 05/16/2019   Procedure: Right Shoulder Arthroscopy;  Surgeon: [name], MD;  Location: [place] [facility];  Service: Orthopedics;  Laterality: Right;    There were no vitals filed for this visit.                      [facility] Adult PT Treatment/Exercise - 08/27/19 0001      Shoulder Exercises: Seated   Other Seated Exercises seated - using tray table to prop elbo at different degrees of abduction working on [facility] ER with eccentric lowering, elbow at side- towel slides for IR/ER, 1# on towl , assisted bicep curls while assist to maintin ER AROM, AAROM and 1# eccentric lowering       Shoulder Exercises: Standing   External Rotation Strengthening;Right;10 reps;Theraband    Theraband Level (Shoulder External Rotation) Level 1 (Yellow)    External Rotation Limitations 3 sets    Internal Rotation AROM;Strengthening;Right;12 reps    Theraband Level (Shoulder Internal Rotation) Level 1 (Yellow)    Internal Rotation Limitations 3 sets    Flexion Strengthening;Right;10 reps;Weights    Shoulder Flexion Weight (lbs) 1 lb    Flexion Limitations 3 sets    ABduction Strengthening;Right;10 reps;Weights    Shoulder ABduction Weight (lbs) 1 lb    Extension Strengthening;Theraband;Left;10 reps    Theraband Level (Shoulder  Extension) Level 3 (Green)    Extension Limitations 3 Dealer;Theraband;Right;10 reps    Theraband Level (Shoulder Row) Level 3 (Green)    Row Weight (lbs) 3 sets      Shoulder Exercises: ROM/Strengthening   UBE (Upper Arm Bike) L1; 1 mins each direction x 3 for 6 mins    Wall Wash 10x2 c mini-squat for facilitation    Wall Pushups 10 reps    Wall Pushups Limitations 3 sets; R UE    Other ROM/Strengthening Exercises AAROM ER standing with cane -seat belt to keep upper arm at side                     PT Short Term Goals - 08/07/19 1110      PT SHORT TERM GOAL #2   Title Pt will demonstrate R shoulder ER strength to neutral in L SL. MET     Baseline -30d less than neutral in L SL    Status Achieved             PT Long Term Goals - 08/07/19 1111      PT LONG TERM GOAL #1   Title Improve R shoulder ER strength to 3/5 for improved functional use of the R UE.ON-GOING: 3-/5    Baseline 2/5    Time 8    Period Weeks    Status New    Target Date 09/03/19      PT LONG TERM GOAL #2   Title Improve AROM for R flex to 90d, abd to 100d in standing and ER to 10d in L SL  for improvedfunctional use of the R UE. MET: Flex 155d, abd 150d, ER 0d.    Baseline Flex 50d, abd 70d, ER -30    Time 8    Period Weeks    Status Achieved    Target Date 09/03/19      PT LONG TERM GOAL #3   Title Pt will report a decrease in R shoulder pain c forward and above head reaching with a range of 0-3/10. PARTIALLY MET: Intermittent 5/10    Baseline 5/10 constant    Time 8    Period Weeks    Status Partially Met    Target Date 09/03/19      PT LONG TERM GOAL #4   Title Pt will be Ind a final HEP to inprove pt's R shoulder AROM, strength and functional use.    Baseline In process    Time 8    Period Weeks    Status New    Target Date 09/03/19      PT LONG TERM GOAL #5   Title Pt will demonstrate R shoulder flex and abd strength of at least 4/5 strength for improved functional use of the R UE. 08/06/19= 3+/5    Baseline 2/5    Time 8    Period Weeks    Status New    Target Date 09/03/19                 Plan - 08/27/19 1446    Clinical Impression Statement Continued working on shoulder strength and ROM. Encouraged pt to make follow up with MD regarding lack of progress.    PT Next Visit Plan Continue R shoulder strengthening for the R RC and shoulder girdle.    PT Home Exercise Plan [phone]           Patient will benefit from skilled therapeutic intervention  in order to improve the following deficits and impairments:  Postural dysfunction, Improper body mechanics, Decreased range of motion, Pain, Impaired UE functional  use, Decreased strength, Obesity  Visit Diagnosis: Muscle weakness (generalized)  Chronic pain in right shoulder     Problem List Patient Active Problem List   Diagnosis Date Noted  . Impingement syndrome of right shoulder   . Nontraumatic complete tear of right rotator cuff   . Osteomyelitis ([place]) 03/11/2019  . Osteomyelitis of fifth toe of left foot ([place])   . Type 2 diabetes mellitus with foot ulcer, without long-term current use of insulin ([place])   . 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 S/P debridement 07/19/2016  . Acute gout of left elbow 07/19/2016    Dorene Ar, PTA 08/27/2019, 2:47 PM  [facility] [address] [place], [place], [address] Phone: [phone]   Fax:  [phone]  Name: Jon Jones MRN: [id number] Date of Birth: [birth year]

## 2019-08-28 DIAGNOSIS — I70213 Atherosclerosis of native arteries of extremities with intermittent claudication, bilateral legs: Secondary | ICD-10-CM | POA: Diagnosis not present

## 2019-08-28 DIAGNOSIS — I10 Essential (primary) hypertension: Secondary | ICD-10-CM | POA: Diagnosis not present

## 2019-08-28 DIAGNOSIS — I255 Ischemic cardiomyopathy: Secondary | ICD-10-CM | POA: Diagnosis not present

## 2019-08-28 DIAGNOSIS — I2510 Atherosclerotic heart disease of native coronary artery without angina pectoris: Secondary | ICD-10-CM | POA: Diagnosis not present

## 2019-08-29 ENCOUNTER — Other Ambulatory Visit: Payer: Self-pay

## 2019-08-29 ENCOUNTER — Ambulatory Visit (INDEPENDENT_AMBULATORY_CARE_PROVIDER_SITE_OTHER): Payer: Medicaid Other | Admitting: Physician Assistant

## 2019-08-29 ENCOUNTER — Encounter: Payer: Self-pay | Admitting: Physician Assistant

## 2019-08-29 VITALS — Ht 65.0 in | Wt 234.0 lb

## 2019-08-29 DIAGNOSIS — G8929 Other chronic pain: Secondary | ICD-10-CM

## 2019-08-29 DIAGNOSIS — M25561 Pain in right knee: Secondary | ICD-10-CM | POA: Diagnosis not present

## 2019-08-29 DIAGNOSIS — M25562 Pain in left knee: Secondary | ICD-10-CM

## 2019-08-29 MED ORDER — LIDOCAINE HCL 1 % IJ SOLN
5.0000 mL | INTRAMUSCULAR | Status: AC | PRN
  Administered 2019-08-29: 5 mL

## 2019-08-29 MED ORDER — METHYLPREDNISOLONE ACETATE 40 MG/ML IJ SUSP
40.0000 mg | INTRAMUSCULAR | Status: AC | PRN
  Administered 2019-08-29: 40 mg via INTRA_ARTICULAR

## 2019-08-29 NOTE — Progress Notes (Signed)
Office Visit Note   [name]           Date of Birth: Mar 15, 1955           MRN: [id number] Visit Date: 08/29/2019              Requested by: No referring provider defined for this encounter. PCP: System, Provider Not In  Chief Complaint  Patient presents with  . Right Shoulder - Routine Post Op    05/16/19 Right shoulder arthroscopy      HPI: This is a pleasant gentleman who is approximately 3 months status post right shoulder arthroscopy and debridement.  At his last visit he was doing well.  He comes in today with 3 issues.  First he has been working with his physical therapist on his right shoulder.  He is having difficulty with certain ranges of motion and a crepitus feeling that is painful to him.  His physical therapist suggested he follow-up to evaluate what is going on with his shoulder.  And if there is further intervention that needs to be done.  Patient cannot have an MRI because he has an implant defibrillator or pacemaker.  He also comes in at his last visit he was having ongoing lower back pain and a referral was sent to Dr. Alvester Morin.  Patient states it has been 45 days and he is called and he has not yet been set up for this referral.  Also comes in today for recurrent left knee pain and wondering if he could get an injection  Assessment & Plan: Visit Diagnoses: No diagnosis found.  Plan: I apologized to the patient and told him I was unsure of where the communication gap was with regards to hitting into see Dr. Alvester Morin and we are following up on this.  With regards to his right shoulder after reviewing his arthroscopy report it does have findings consistent with a full thickness rotator cuff tear and I did discuss with him preoperative expectations with regards to returning to 100%.  He is very understanding of this.  I think his best option would be to follow-up with Dr. Lajoyce Corners who can better discuss with him future options on his right shoulder.  With regards to his  left knee I am happy to inject that today  Follow-Up Instructions: No follow-ups on file.   Ortho Exam  Patient is alert, oriented, no adenopathy, well-dressed, normal affect, normal respiratory effort. Focused examination of his right shoulder demonstrates well-healed surgical portals.  He does have limited internal and external rotation.  He does have fairly good extension above his head to about 160 degrees.  Strength is good.  He does have moderate amount of pain with range of motion  Left knee crepitus with range of motion review of x-rays demonstrate advanced degenerative changes in the knee  Imaging: No results found. No images are attached to the encounter.  Labs: Lab Results  Component Value Date   HGBA1C 7.6 (H) 03/11/2019   ESRSEDRATE 11 03/11/2019   CRP 1.8 (H) 03/11/2019   REPTSTATUS 07/24/2016 FINAL 07/19/2016   GRAMSTAIN  07/19/2016    RARE WBC PRESENT, PREDOMINANTLY MONONUCLEAR NO ORGANISMS SEEN    CULT NO GROWTH 5 DAYS 07/19/2016     Lab Results  Component Value Date   ALBUMIN 3.7 03/11/2019    No results found for: MG No results found for: VD25OH  No results found for: PREALBUMIN CBC EXTENDED Latest Ref Rng & Units 05/16/2019 03/13/2019 03/12/2019  WBC  4.0 - 10.5 K/uL - 9.5 9.2  RBC 4.22 - 5.81 MIL/uL - 4.43 4.58  HGB 13.0 - 17.0 g/dL 14.6 13.7 14.2  HCT 39 - 52 % 43.0 42.4 44.0  PLT 150 - 400 K/uL - 197 198  NEUTROABS 1.7 - 7.7 K/uL - - -  LYMPHSABS 0.7 - 4.0 K/uL - - -     Body mass index is 38.94 kg/m.  Orders:  No orders of the defined types were placed in this encounter.  No orders of the defined types were placed in this encounter.    Procedures: Large Joint Inj: L knee on 08/29/2019 2:50 PM Indications: pain and diagnostic evaluation Details: 22 G 1.5 in needle  Arthrogram: No  Medications: 40 mg methylPREDNISolone acetate 40 MG/ML; 5 mL lidocaine 1 % Outcome: tolerated well, no immediate complications Consent was given by the  patient.      Clinical Data: No additional findings.  ROS:  All other systems negative, except as noted in the HPI. Review of Systems  Objective: Vital Signs: Ht 5\' 5"  (1.651 m)   Wt 234 lb (106.1 kg)   BMI 38.94 kg/m   Specialty Comments:  No specialty comments available.  PMFS History: Patient Active Problem List   Diagnosis Date Noted  . Impingement syndrome of right shoulder   . Nontraumatic complete tear of right rotator cuff   . Osteomyelitis ([place]) 03/11/2019  . Osteomyelitis of fifth toe of left foot ([place])   . Type 2 diabetes mellitus with foot ulcer, without long-term current use of insulin ([place])   . 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 S/P debridement 07/19/2016  . Acute gout of left elbow 07/19/2016   Past Medical History:  Diagnosis Date  . 5 years ago   . AICD (automatic cardioverter/defibrillator) present   . Anxiety   . Arthritis   . CHF (congestive heart failure) ([place])   . Coronary artery disease   . Diabetes mellitus without complication ([place])   . Dysrhythmia   . GERD (gastroesophageal reflux disease)    PMH  . Hypertension   . Peripheral vascular disease ([place])   . Peripheral vascular disease ([place])   . Pneumonia   . Shoulder impingement, right   . Sleep apnea    wears cpap  . Stomach ulcer   . Wears glasses     Family History  Problem Relation Age of Onset  . Heart disease Mother   . Diabetes Mother   . Peripheral vascular disease Mother     Past Surgical History:  Procedure Laterality Date  . AMPUTATION Left 03/12/2019   Procedure: 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;  Surgeon: [name], MD;  Location: [facility];  Service: Orthopedics;  Laterality: Left;  . CARDIAC CATHETERIZATION    . CARDIAC DEFIBRILLATOR PLACEMENT    . I & D EXTREMITY Left 07/19/2016   Procedure: IRRIGATION AND DEBRIDEMENT EXTREMITY/OLECRANON(WASHOUT);  Surgeon: Leandrew Koyanagi, MD;  Location: [place];  Service: Orthopedics;  Laterality: Left;  .  OLECRANON BURSECTOMY Left 07/19/2016   Procedure: LEFT ELBOW OLECRANON BURSECTOMY;  Surgeon: Leandrew Koyanagi, MD;  Location: [place];  Service: Orthopedics;  Laterality: Left;  . SHOULDER ARTHROSCOPY Right 05/16/2019   Procedure: Right Shoulder Arthroscopy;  Surgeon: [name], MD;  Location: [facility] [place];  Service: Orthopedics;  Laterality: Right;   Social History   Occupational History  . Not on file  Tobacco Use  . Smoking status: Former Smoker  Packs/day: 1.00    Years: 40.00    Pack years: 40.00    Types: Cigarettes    Quit date: 03/11/2019    Years since quitting: 0.4  . Smokeless tobacco: Never Used  Vaping Use  . Vaping Use: Never used  Substance and Sexual Activity  . Alcohol use: Not Currently  . Drug use: Yes    Types: Oxycodone  . Sexual activity: Not on file

## 2019-09-02 ENCOUNTER — Encounter: Payer: Self-pay | Admitting: Physical Therapy

## 2019-09-02 ENCOUNTER — Ambulatory Visit: Payer: Medicaid Other | Admitting: Physical Therapy

## 2019-09-02 ENCOUNTER — Other Ambulatory Visit: Payer: Self-pay

## 2019-09-02 DIAGNOSIS — M6281 Muscle weakness (generalized): Secondary | ICD-10-CM

## 2019-09-02 DIAGNOSIS — M25511 Pain in right shoulder: Secondary | ICD-10-CM | POA: Diagnosis not present

## 2019-09-02 DIAGNOSIS — G8929 Other chronic pain: Secondary | ICD-10-CM

## 2019-09-02 DIAGNOSIS — Z95810 Presence of automatic (implantable) cardiac defibrillator: Secondary | ICD-10-CM | POA: Diagnosis not present

## 2019-09-02 NOTE — Therapy (Signed)
[place], [place], [address] Phone: [phone]   Fax:  [phone]  Physical Therapy Treatment  Patient Details  Name: Jon Jones MRN: [id number] Date of Birth: 1956/02/25 Referring Provider (PT): Shanon Rosser, [place]   Encounter Date: 09/02/2019   PT End of Session - 09/02/19 1030    Visit Number 14    Number of Visits 20    Date for PT Re-Evaluation 09/19/19    Authorization Type Medicaid    Authorization Time Period 08/13/19-09/23/19    Authorization - Visit Number 6    Authorization - Number of Visits 12    PT Start Time 1013    PT Stop Time 3361    PT Time Calculation (min) 39 min    Activity Tolerance Patient tolerated treatment well    Behavior During Therapy [facility] for tasks assessed/performed           Past Medical History:  Diagnosis Date   5 years ago    AICD (automatic cardioverter/defibrillator) present    Anxiety    Arthritis    CHF (congestive heart failure) ([place])    Coronary artery disease    Diabetes mellitus without complication ([name])    Dysrhythmia    GERD (gastroesophageal reflux disease)    PMH   Hypertension    Peripheral vascular disease ([place])    Peripheral vascular disease ([place])    Pneumonia    Shoulder impingement, right    Sleep apnea    wears cpap   Stomach ulcer    Wears glasses     Past Surgical History:  Procedure Laterality Date   AMPUTATION Left 03/12/2019   Procedure: 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;  Surgeon: [name], MD;  Location: [place];  Service: Orthopedics;  Laterality: Left;   CARDIAC CATHETERIZATION     CARDIAC DEFIBRILLATOR PLACEMENT     I & D EXTREMITY Left 07/19/2016   Procedure: IRRIGATION AND DEBRIDEMENT EXTREMITY/OLECRANON(WASHOUT);  Surgeon: Leandrew Koyanagi, MD;  Location: [place];  Service: Orthopedics;  Laterality: Left;   OLECRANON BURSECTOMY Left 07/19/2016   Procedure: LEFT ELBOW OLECRANON BURSECTOMY;  Surgeon: Leandrew Koyanagi, MD;  Location: [place];  Service: Orthopedics;  Laterality: Left;   SHOULDER ARTHROSCOPY Right 05/16/2019   Procedure: Right Shoulder Arthroscopy;  Surgeon: [name], MD;  Location: [place];  Service: Orthopedics;  Laterality: Right;    There were no vitals filed for this visit.   Subjective Assessment - 09/02/19 1021    Subjective Pt. continues to reports continued issues with right shoulder pain. He has follow up scheduled with Dr. Sharol Given 09/04/19 to discuss options moving forward. Pain 2/10 this AM.    Currently in Pain? Yes    Pain Score 2     Pain Location Shoulder    Pain Orientation Right    Pain Descriptors / Indicators Aching    Pain Type Chronic pain    Pain Onset More than a month ago    Pain Frequency Intermittent    Aggravating Factors  AROM/reaching    Pain Relieving Factors rest, medication    Effect of Pain on Daily Activities limits use reaching ADLs and lifting activities              [facility] PT Assessment - 09/02/19 0001      AROM   Right Shoulder Flexion 153 Degrees    Right Shoulder ABduction 140 Degrees  [place] Adult PT Treatment/Exercise - 09/02/19 0001      Shoulder Exercises: Supine   Protraction Limitations serratus punch 1 lb. 2x10    Horizontal ABduction AROM;Strengthening;Both;20 reps    Theraband Level (Shoulder Horizontal ABduction) Level 2 (Red)      Shoulder Exercises: Sidelying   ABduction AROM;Strengthening;Right;20 reps    ABduction Weight (lbs) 1      Shoulder Exercises: Standing   External Rotation Strengthening;Right;10 reps;Theraband    Theraband Level (Shoulder External Rotation) Level 1 (Yellow)    External Rotation Limitations 3 sets    Internal Rotation AROM;Strengthening;Right;12 reps    Theraband Level (Shoulder Internal Rotation) Level 1 (Yellow)    Internal Rotation Limitations 3 sets    Shoulder Flexion Weight (lbs) 1 lb    Flexion Limitations 3 sets     ABduction Strengthening;Right;10 reps;Weights    Shoulder ABduction Weight (lbs) 1 lb    ABduction Limitations plane of scaption with full can position    Extension Strengthening;Theraband;Left;10 reps    Theraband Level (Shoulder Extension) Level 3 (Green)    Extension Limitations 3 Dealer;Theraband;Right;10 reps    Theraband Level (Shoulder Row) Level 3 (Green)    Row Weight (lbs) 3 sets      Shoulder Exercises: ROM/Strengthening   UBE (Upper Arm Bike) L1; 1 mins each direction x 3 for 6 mins    Wall Wash 10x2 c mini-squat for facilitation    Wall Pushups 10 reps    Wall Pushups Limitations 3 sets; R UE    Other ROM/Strengthening Exercises [place] flexion yellow band 2x10 cues to keep arm in sagittal plane of motion                    PT Short Term Goals - 08/07/19 1110      PT SHORT TERM GOAL #2   Title Pt will demonstrate R shoulder ER strength to neutral in L SL. MET    Baseline -30d less than neutral in L SL    Status Achieved             PT Long Term Goals - 08/07/19 1111      PT LONG TERM GOAL #1   Title Improve R shoulder ER strength to 3/5 for improved functional use of the R UE.ON-GOING: 3-/5    Baseline 2/5    Time 8    Period Weeks    Status New    Target Date 09/03/19      PT LONG TERM GOAL #2   Title Improve AROM for R flex to 90d, abd to 100d in standing and ER to 10d in L SL  for improvedfunctional use of the R UE. MET: Flex 155d, abd 150d, ER 0d.    Baseline Flex 50d, abd 70d, ER -30    Time 8    Period Weeks    Status Achieved    Target Date 09/03/19      PT LONG TERM GOAL #3   Title Pt will report a decrease in R shoulder pain c forward and above head reaching with a range of 0-3/10. PARTIALLY MET: Intermittent 5/10    Baseline 5/10 constant    Time 8    Period Weeks    Status Partially Met    Target Date 09/03/19      PT LONG TERM GOAL #4   Title Pt will be Ind a final HEP to inprove pt's R shoulder AROM,  strength and functional use.  Baseline In process    Time 8    Period Weeks    Status New    Target Date 09/03/19      PT LONG TERM GOAL #5   Title Pt will demonstrate R shoulder flex and abd strength of at least 4/5 strength for improved functional use of the R UE. 08/06/19= 3+/5    Baseline 2/5    Time 8    Period Weeks    Status New    Target Date 09/03/19                 Plan - 09/02/19 1137    Clinical Impression Statement Pt. is frustrated with persistent shoulder pain. He has improved his right shoulder elevation motions as noted in objective but still weak in particular with ER motion with underlying rotator cuff tear. At this point pt. will follow up with Dr. Sharol Given Thursday to discuss potential further tx. options so will await status from this visit.    Personal Factors and Comorbidities Comorbidity 3+    Comorbidities CHF,anxiety, DM    Examination-Activity Limitations Carry;Lift;Reach Overhead;Self Feeding    Stability/Clinical Decision Making Evolving/Moderate complexity    Clinical Decision Making Moderate    Rehab Potential Good    PT Frequency 2x / week    PT Duration 6 weeks    PT Treatment/Interventions Moist Heat;Therapeutic activities;Therapeutic exercise;Manual techniques;Taping;Dry needling;Passive range of motion;Patient/family education;Electrical Stimulation;Iontophoresis 28m/ml Dexamethasone    PT Next Visit Plan Await status from visit with Dr. DSharol Given Continue R shoulder strengthening for the R RC and shoulder girdle.    PT Home Exercise Plan [id number]    Consulted and Agree with Plan of Care Patient           Patient will benefit from skilled therapeutic intervention in order to improve the following deficits and impairments:  Postural dysfunction, Improper body mechanics, Decreased range of motion, Pain, Impaired UE functional use, Decreased strength, Obesity  Visit Diagnosis: Muscle weakness (generalized)  Chronic pain in right  shoulder     Problem List Patient Active Problem List   Diagnosis Date Noted   Impingement syndrome of right shoulder    Nontraumatic complete tear of right rotator cuff    Osteomyelitis ([place] 03/11/2019   Osteomyelitis of fifth toe of left foot ([place]    Type 2 diabetes mellitus with foot ulcer, without long-term current use of insulin ([name])    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

## 2019-09-04 ENCOUNTER — Ambulatory Visit: Payer: Medicaid Other | Admitting: Physical Therapy

## 2019-09-04 ENCOUNTER — Encounter: Payer: Self-pay | Admitting: Physical Therapy

## 2019-09-04 ENCOUNTER — Other Ambulatory Visit: Payer: Self-pay

## 2019-09-04 ENCOUNTER — Ambulatory Visit (INDEPENDENT_AMBULATORY_CARE_PROVIDER_SITE_OTHER): Payer: Medicaid Other | Admitting: Orthopedic Surgery

## 2019-09-04 DIAGNOSIS — G8929 Other chronic pain: Secondary | ICD-10-CM

## 2019-09-04 DIAGNOSIS — M5416 Radiculopathy, lumbar region: Secondary | ICD-10-CM | POA: Diagnosis not present

## 2019-09-04 DIAGNOSIS — M6281 Muscle weakness (generalized): Secondary | ICD-10-CM

## 2019-09-04 DIAGNOSIS — M75121 Complete rotator cuff tear or rupture of right shoulder, not specified as traumatic: Secondary | ICD-10-CM

## 2019-09-04 DIAGNOSIS — M25511 Pain in right shoulder: Secondary | ICD-10-CM | POA: Diagnosis not present

## 2019-09-04 NOTE — Therapy (Signed)
[place], [place], [address] Phone: [phone]   Fax:  [phone]  Physical Therapy Treatment  Patient Details  Name: Jon Jones MRN: [id number] Date of Birth: 11-Oct-1955 Referring Provider (PT): Shanon Rosser, [place]   Encounter Date: 09/04/2019   PT End of Session - 09/04/19 1409    Visit Number 15    Number of Visits 20    Date for PT Re-Evaluation 09/19/19    Authorization Type Medicaid    Authorization Time Period 08/13/19-09/23/19    Authorization - Visit Number 7    Authorization - Number of Visits 12    PT Start Time 1400    PT Stop Time 1440    PT Time Calculation (min) 40 min           Past Medical History:  Diagnosis Date  . 5 years ago   . AICD (automatic cardioverter/defibrillator) present   . Anxiety   . Arthritis   . CHF (congestive heart failure) ([place])   . Coronary artery disease   . Diabetes mellitus without complication ([place])   . Dysrhythmia   . GERD (gastroesophageal reflux disease)    PMH  . Hypertension   . Peripheral vascular disease ([place])   . Peripheral vascular disease ([place])   . Pneumonia   . Shoulder impingement, right   . Sleep apnea    wears cpap  . Stomach ulcer   . Wears glasses     Past Surgical History:  Procedure Laterality Date  . AMPUTATION Left 03/12/2019   Procedure: 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;  Surgeon: [name], MD;  Location: [place];  Service: Orthopedics;  Laterality: Left;  . CARDIAC CATHETERIZATION    . CARDIAC DEFIBRILLATOR PLACEMENT    . I & D EXTREMITY Left 07/19/2016   Procedure: IRRIGATION AND DEBRIDEMENT EXTREMITY/OLECRANON(WASHOUT);  Surgeon: Leandrew Koyanagi, MD;  Location: [place];  Service: Orthopedics;  Laterality: Left;  . OLECRANON BURSECTOMY Left 07/19/2016   Procedure: LEFT ELBOW OLECRANON BURSECTOMY;  Surgeon: Leandrew Koyanagi, MD;  Location: [place];  Service: Orthopedics;  Laterality: Left;  . SHOULDER ARTHROSCOPY  Right 05/16/2019   Procedure: Right Shoulder Arthroscopy;  Surgeon: [name], MD;  Location: [place];  Service: Orthopedics;  Laterality: Right;    There were no vitals filed for this visit.   Subjective Assessment - 09/04/19 1402    Subjective MD said, between the fall and the arthritis the ball of my socket is almost gone. I would need a shoulder replacement and he does not recommend it due to risks of infection or failure of surgery. MD suggested that I get home program and wait one year to see if I want to have the surgery.    Currently in Pain? Yes    Pain Score 4     Pain Location Shoulder    Pain Orientation Right    Pain Descriptors / Indicators Sore    Pain Type Chronic pain                             [facility] Adult PT Treatment/Exercise - 09/04/19 0001      Shoulder Exercises: Supine   Protraction Limitations serratus punch 1 lb. 2x10    Horizontal ABduction AROM;Strengthening;Both;20 reps    Theraband Level (Shoulder Horizontal ABduction) Level 2 (Red)      Shoulder Exercises: Seated   Other Seated Exercises Seated ER AROM  from elbow propped at 80 degrees abduction  10 x 3       Shoulder Exercises: Sidelying   External Rotation Strengthening;Left;10 reps   left hand assist concentric motion for sets 2-3   ABduction AROM;Strengthening;Right;20 reps      Shoulder Exercises: Standing   External Rotation Strengthening;Right;10 reps;Theraband    Theraband Level (Shoulder External Rotation) Level 1 (Yellow)    External Rotation Limitations 3 sets    Internal Rotation AROM;Strengthening;Right;12 reps    Theraband Level (Shoulder Internal Rotation) Level 3 (Green)    Internal Rotation Limitations 3 sets    Flexion Limitations 3 sets    ABduction Strengthening;Right;10 reps;Weights    Extension Strengthening;Theraband;Left;10 reps    Theraband Level (Shoulder Extension) Level 3 (Green)    Extension Limitations 3 Counselling psychologist;Theraband;Right;10 reps    Theraband Level (Shoulder Row) Level 3 (Green)    Row Weight (lbs) 3 sets    Other Standing Exercises stanidng abduction AROM       Shoulder Exercises: Pulleys   Flexion 2 minutes      Shoulder Exercises: ROM/Strengthening   UBE (Upper Arm Bike) L1; 1 mins each direction x 3 for 6 mins    Wall Wash 10x2 c mini-squat for facilitation    Wall Pushups 10 reps    Wall Pushups Limitations 3 sets; R UE    Other ROM/Strengthening Exercises [place] flexion yellow band 2x10 cues to keep arm in sagittal plane of motion                    PT Short Term Goals - 08/07/19 1110      PT SHORT TERM GOAL #2   Title Pt will demonstrate R shoulder ER strength to neutral in L SL. MET    Baseline -30d less than neutral in L SL    Status Achieved             PT Long Term Goals - 08/07/19 1111      PT LONG TERM GOAL #1   Title Improve R shoulder ER strength to 3/5 for improved functional use of the R UE.ON-GOING: 3-/5    Baseline 2/5    Time 8    Period Weeks    Status New    Target Date 09/03/19      PT LONG TERM GOAL #2   Title Improve AROM for R flex to 90d, abd to 100d in standing and ER to 10d in L SL  for improvedfunctional use of the R UE. MET: Flex 155d, abd 150d, ER 0d.    Baseline Flex 50d, abd 70d, ER -30    Time 8    Period Weeks    Status Achieved    Target Date 09/03/19      PT LONG TERM GOAL #3   Title Pt will report a decrease in R shoulder pain c forward and above head reaching with a range of 0-3/10. PARTIALLY MET: Intermittent 5/10    Baseline 5/10 constant    Time 8    Period Weeks    Status Partially Met    Target Date 09/03/19      PT LONG TERM GOAL #4   Title Pt will be Ind a final HEP to inprove pt's R shoulder AROM, strength and functional use.    Baseline In process    Time 8    Period Weeks    Status New    Target Date 09/03/19  PT LONG TERM GOAL #5   Title Pt will demonstrate R shoulder flex  and abd strength of at least 4/5 strength for improved functional use of the R UE. 08/06/19= 3+/5    Baseline 2/5    Time 8    Period Weeks    Status New    Target Date 09/03/19                 Plan - 09/04/19 1409    Clinical Impression Statement Pt reports MD recommedned HEP for 1 year and then may consider shoulder replacement. Continued with strengthening as tolerated. Will plan to use remaining scheduled visits to review and finalize HEP.    PT Next Visit Plan Finalize HEP over remaining 5 visits, DC when appropriate    PT Home Exercise Plan [phone]           Patient will benefit from skilled therapeutic intervention in order to improve the following deficits and impairments:  Postural dysfunction, Improper body mechanics, Decreased range of motion, Pain, Impaired UE functional use, Decreased strength, Obesity  Visit Diagnosis: Muscle weakness (generalized)  Chronic pain in right shoulder     Problem List Patient Active Problem List   Diagnosis Date Noted  . Impingement syndrome of right shoulder   . Nontraumatic complete tear of right rotator cuff   . Osteomyelitis ([place]) 03/11/2019  . Osteomyelitis of fifth toe of left foot ([place])   . Type 2 diabetes mellitus with foot ulcer, without long-term current use of insulin ([place])   . 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 S/P debridement 07/19/2016  . Acute gout of left elbow 07/19/2016    Dorene Ar, PTA 09/04/2019, 2:37 PM  [facility] [address] [place], [place], [address] Phone: [phone]   Fax:  [phone]  Name: Kunal Levario MRN: [id number] Date of Birth: [birth year]

## 2019-09-07 DIAGNOSIS — I70219 Atherosclerosis of native arteries of extremities with intermittent claudication, unspecified extremity: Secondary | ICD-10-CM | POA: Diagnosis not present

## 2019-09-07 DIAGNOSIS — I70203 Unspecified atherosclerosis of native arteries of extremities, bilateral legs: Secondary | ICD-10-CM | POA: Diagnosis not present

## 2019-09-08 ENCOUNTER — Encounter: Payer: Self-pay | Admitting: Orthopedic Surgery

## 2019-09-08 NOTE — Progress Notes (Signed)
Office Visit Note   [name]           Date of Birth: 1956-03-09           MRN: [id number] Visit Date: 09/04/2019              Requested by: No referring provider defined for this encounter. PCP: System, Provider Not In  Chief Complaint  Patient presents with  . Right Shoulder - Routine Post Op  . Left Knee - Follow-up      HPI: Patient is a 64 year old gentleman who presents in follow-up for right shoulder lumbar spine and left knee.  Patient states his knee is better after the injection.  Patient states he has been having some ratcheting pain in the right shoulder he is about 3-1/2 months out from right shoulder arthroscopy.  His arthroscopic findings showed essentially complete tear of the biceps tendon as SLAP lesion as well as a complete rotator cuff tear.  Patient does have a follow-up with Dr. Alvester Morin for epidural steroid injection.  Patient states his hemoglobin A1c recently was 7.3.  Assessment & Plan: Visit Diagnoses:  1. Nontraumatic complete tear of right rotator cuff   2. Lumbar back pain with radiculopathy affecting left lower extremity     Plan: Recommended physical therapy for strengthening of the right shoulder.  Discussed that with muscle strengthening he should be able to get good function out of his shoulder otherwise his option would be to proceed with a reverse total shoulder.  Recommended stopping narcotics for the shoulder symptoms recommended anti-inflammatory gels such as Voltaren gel.  Follow-Up Instructions: No follow-ups on file.   Ortho Exam  Patient is alert, oriented, no adenopathy, well-dressed, normal affect, normal respiratory effort. Examination patient has full active range of motion of the right shoulder he has external rotation of 90 degrees internal rotation of 70 degrees he does have excellent range of motion but does have crepitation with range of motion.  Imaging: No results found. No images are attached to the  encounter.  Labs: Lab Results  Component Value Date   HGBA1C 7.6 (H) 03/11/2019   ESRSEDRATE 11 03/11/2019   CRP 1.8 (H) 03/11/2019   REPTSTATUS 07/24/2016 FINAL 07/19/2016   GRAMSTAIN  07/19/2016    RARE WBC PRESENT, PREDOMINANTLY MONONUCLEAR NO ORGANISMS SEEN    CULT NO GROWTH 5 DAYS 07/19/2016     Lab Results  Component Value Date   ALBUMIN 3.7 03/11/2019    No results found for: MG No results found for: VD25OH  No results found for: PREALBUMIN CBC EXTENDED Latest Ref Rng & Units 05/16/2019 03/13/2019 03/12/2019  WBC 4.0 - 10.5 K/uL - 9.5 9.2  RBC 4.22 - 5.81 MIL/uL - 4.43 4.58  HGB 13.0 - 17.0 g/dL 30.8 65.7 84.6  HCT 39 - 52 % 43.0 42.4 44.0  PLT 150 - 400 K/uL - 197 198  NEUTROABS 1.7 - 7.7 K/uL - - -  LYMPHSABS 0.7 - 4.0 K/uL - - -     There is no height or weight on file to calculate BMI.  Orders:  No orders of the defined types were placed in this encounter.  No orders of the defined types were placed in this encounter.    Procedures: No procedures performed  Clinical Data: No additional findings.  ROS:  All other systems negative, except as noted in the HPI. Review of Systems  Objective: Vital Signs: There were no vitals taken for this visit.  Specialty Comments:  No  specialty comments available.  PMFS History: Patient Active Problem List   Diagnosis Date Noted  . Impingement syndrome of right shoulder   . Nontraumatic complete tear of right rotator cuff   . Osteomyelitis ([name]) 03/11/2019  . Osteomyelitis of fifth toe of left foot ([name])   . Type 2 diabetes mellitus with foot ulcer, without long-term current use of insulin ([name])   . 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 S/P debridement 07/19/2016  . Acute gout of left elbow 07/19/2016   Past Medical History:  Diagnosis Date  . 5 years ago   . AICD (automatic cardioverter/defibrillator) present   . Anxiety   . Arthritis   . CHF (congestive heart failure)  ([name])   . Coronary artery disease   . Diabetes mellitus without complication ([name])   . Dysrhythmia   . GERD (gastroesophageal reflux disease)    PMH  . Hypertension   . Peripheral vascular disease ([name])   . Peripheral vascular disease ([name])   . Pneumonia   . Shoulder impingement, right   . Sleep apnea    wears cpap  . Stomach ulcer   . Wears glasses     Family History  Problem Relation Age of Onset  . Heart disease Mother   . Diabetes Mother   . Peripheral vascular disease Mother     Past Surgical History:  Procedure Laterality Date  . AMPUTATION Left 03/12/2019   Procedure: 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;  Surgeon: [name], MD;  Location: [facility];  Service: Orthopedics;  Laterality: Left;  . CARDIAC CATHETERIZATION    . CARDIAC DEFIBRILLATOR PLACEMENT    . I & D EXTREMITY Left 07/19/2016   Procedure: IRRIGATION AND DEBRIDEMENT EXTREMITY/OLECRANON(WASHOUT);  Surgeon: Tarry Kos, MD;  Location: [facility];  Service: Orthopedics;  Laterality: Left;  . OLECRANON BURSECTOMY Left 07/19/2016   Procedure: LEFT ELBOW OLECRANON BURSECTOMY;  Surgeon: Tarry Kos, MD;  Location: [facility];  Service: Orthopedics;  Laterality: Left;  . SHOULDER ARTHROSCOPY Right 05/16/2019   Procedure: Right Shoulder Arthroscopy;  Surgeon: [name], MD;  Location: [facility] OR;  Service: Orthopedics;  Laterality: Right;   Social History   Occupational History  . Not on file  Tobacco Use  . Smoking status: Former Smoker    Packs/day: 1.00    Years: 40.00    Pack years: 40.00    Types: Cigarettes    Quit date: 03/11/2019    Years since quitting: 0.4  . Smokeless tobacco: Never Used  Vaping Use  . Vaping Use: Never used  Substance and Sexual Activity  . Alcohol use: Not Currently  . Drug use: Yes    Types: Oxycodone  . Sexual activity: Not on file

## 2019-09-09 ENCOUNTER — Encounter: Payer: Self-pay | Admitting: Physical Therapy

## 2019-09-09 ENCOUNTER — Other Ambulatory Visit: Payer: Self-pay

## 2019-09-09 ENCOUNTER — Ambulatory Visit: Payer: Medicaid Other | Admitting: Physical Therapy

## 2019-09-09 DIAGNOSIS — M6281 Muscle weakness (generalized): Secondary | ICD-10-CM

## 2019-09-09 DIAGNOSIS — G8929 Other chronic pain: Secondary | ICD-10-CM

## 2019-09-09 DIAGNOSIS — M25511 Pain in right shoulder: Secondary | ICD-10-CM | POA: Diagnosis not present

## 2019-09-09 NOTE — Therapy (Signed)
[place], [place], [address] Phone: [phone]   Fax:  [phone]  Physical Therapy Treatment  Patient Details  Name: Jon Jones MRN: [id number] Date of Birth: [birth year] Referring Provider (PT): Shanon Rosser, [place]   Encounter Date: 09/09/2019   PT End of Session - 09/09/19 1102    Visit Number 16    Number of Visits 20    Date for PT Re-Evaluation 09/19/19    Authorization Type Medicaid    Authorization Time Period 08/13/19-09/23/19    Authorization - Visit Number 8    Authorization - Number of Visits 12    PT Start Time 4656    PT Stop Time 1137    PT Time Calculation (min) 39 min    Activity Tolerance Patient tolerated treatment well    Behavior During Therapy [facility] for tasks assessed/performed           Past Medical History:  Diagnosis Date  . 5 years ago   . AICD (automatic cardioverter/defibrillator) present   . Anxiety   . Arthritis   . CHF (congestive heart failure) ([place])   . Coronary artery disease   . Diabetes mellitus without complication ([place])   . Dysrhythmia   . GERD (gastroesophageal reflux disease)    PMH  . Hypertension   . Peripheral vascular disease ([place])   . Peripheral vascular disease ([place])   . Pneumonia   . Shoulder impingement, right   . Sleep apnea    wears cpap  . Stomach ulcer   . Wears glasses     Past Surgical History:  Procedure Laterality Date  . AMPUTATION Left 03/12/2019   Procedure: 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;  Surgeon: [name], MD;  Location: [place];  Service: Orthopedics;  Laterality: Left;  . CARDIAC CATHETERIZATION    . CARDIAC DEFIBRILLATOR PLACEMENT    . I & D EXTREMITY Left 07/19/2016   Procedure: IRRIGATION AND DEBRIDEMENT EXTREMITY/OLECRANON(WASHOUT);  Surgeon: Leandrew Koyanagi, MD;  Location: [place];  Service: Orthopedics;  Laterality: Left;  . OLECRANON BURSECTOMY Left 07/19/2016   Procedure: LEFT ELBOW OLECRANON BURSECTOMY;  Surgeon: Leandrew Koyanagi, MD;  Location: [place];  Service: Orthopedics;  Laterality: Left;  . SHOULDER ARTHROSCOPY Right 05/16/2019   Procedure: Right Shoulder Arthroscopy;  Surgeon: [name], MD;  Location: [place];  Service: Orthopedics;  Laterality: Right;    There were no vitals filed for this visit.   Subjective Assessment - 09/09/19 1100    Subjective Right shoulder pain 3/10 this AM. Some soreness after last session for about an hour but eased with ice. Pt. requests to update HEP today.    Currently in Pain? Yes    Pain Score 3     Pain Location Shoulder    Pain Orientation Right    Pain Descriptors / Indicators Sore    Pain Type Chronic pain    Pain Onset More than a month ago    Pain Frequency Intermittent    Aggravating Factors  AROM/reaching    Pain Relieving Factors rest, medication    Effect of Pain on Daily Activities limits use reaching ADLs and lifting activities                             [facility] Adult PT Treatment/Exercise - 09/09/19 0001      Shoulder Exercises: Supine   Protraction Limitations serratus punch 2x10 right side  Horizontal ABduction Limitations brief HEP review      Shoulder Exercises: Sidelying   External Rotation AROM;Right;20 reps    ABduction AROM;Strengthening;Right;20 reps    ABduction Limitations plane of scaption      Shoulder Exercises: Standing   Internal Rotation AROM;Strengthening;Right;12 reps    Theraband Level (Shoulder Internal Rotation) Level 3 (Green)    Internal Rotation Limitations 3 sets    Flexion Limitations 2x10 with 1 lb.    ABduction Limitations 1x10 with bodyweight in plane of scaption-tendency shoulder shrug with pain associated with motion so switched to sidelying    Extension Strengthening;Theraband;Left;10 reps    Theraband Level (Shoulder Extension) Level 3 (Green)    Extension Limitations 3 Dealer;Theraband;Right;10 reps    Theraband Level (Shoulder Row) Level 3  (Green)    Row Weight (lbs) 3 sets      Shoulder Exercises: ROM/Strengthening   UBE (Upper Arm Bike) L1; 1 mins each direction x 3 for 6 mins    Wall Pushups 20 reps      Shoulder Exercises: Isometric Strengthening   Flexion Limitations 5 sec holds x 10 reps    ABduction Limitations 5 sec holds x10 reps                    PT Short Term Goals - 08/07/19 1110      PT SHORT TERM GOAL #2   Title Pt will demonstrate R shoulder ER strength to neutral in L SL. MET    Baseline -30d less than neutral in L SL    Status Achieved             PT Long Term Goals - 08/07/19 1111      PT LONG TERM GOAL #1   Title Improve R shoulder ER strength to 3/5 for improved functional use of the R UE.ON-GOING: 3-/5    Baseline 2/5    Time 8    Period Weeks    Status New    Target Date 09/03/19      PT LONG TERM GOAL #2   Title Improve AROM for R flex to 90d, abd to 100d in standing and ER to 10d in L SL  for improvedfunctional use of the R UE. MET: Flex 155d, abd 150d, ER 0d.    Baseline Flex 50d, abd 70d, ER -30    Time 8    Period Weeks    Status Achieved    Target Date 09/03/19      PT LONG TERM GOAL #3   Title Pt will report a decrease in R shoulder pain c forward and above head reaching with a range of 0-3/10. PARTIALLY MET: Intermittent 5/10    Baseline 5/10 constant    Time 8    Period Weeks    Status Partially Met    Target Date 09/03/19      PT LONG TERM GOAL #4   Title Pt will be Ind a final HEP to inprove pt's R shoulder AROM, strength and functional use.    Baseline In process    Time 8    Period Weeks    Status New    Target Date 09/03/19      PT LONG TERM GOAL #5   Title Pt will demonstrate R shoulder flex and abd strength of at least 4/5 strength for improved functional use of the R UE. 08/06/19= 3+/5    Baseline 2/5    Time 8    Period  Weeks    Status New    Target Date 09/03/19                 Plan - 09/09/19 1103    Clinical Impression  Statement Updated/reviewed HEP per pt. request with therapy session focused on performance and practice of HEP exercises. Fair progress for shoulder function impacted by underlying complete rotator cuff tear and OA.    Personal Factors and Comorbidities Comorbidity 3+    Comorbidities CHF,anxiety, DM    Examination-Activity Limitations Carry;Lift;Reach Overhead;Self Feeding    Stability/Clinical Decision Making Evolving/Moderate complexity    Clinical Decision Making Moderate    Rehab Potential Fair    PT Frequency 2x / week    PT Duration 6 weeks    PT Treatment/Interventions Moist Heat;Therapeutic activities;Therapeutic exercise;Manual techniques;Taping;Dry needling;Passive range of motion;Patient/family education;Electrical Stimulation;Iontophoresis 23m/ml Dexamethasone    PT Next Visit Plan Finalize HEP over remaining 5 visits, DC when appropriate    PT Home Exercise Plan [id number]    Consulted and Agree with Plan of Care Patient           Patient will benefit from skilled therapeutic intervention in order to improve the following deficits and impairments:  Postural dysfunction, Improper body mechanics, Decreased range of motion, Pain, Impaired UE functional use, Decreased strength, Obesity  Visit Diagnosis: Muscle weakness (generalized)  Chronic pain in right shoulder     Problem List Patient Active Problem List   Diagnosis Date Noted  . Impingement syndrome of right shoulder   . Nontraumatic complete tear of right rotator cuff   . Osteomyelitis ([place] 03/11/2019  . Osteomyelitis of fifth toe of left foot ([place]   . Type 2 diabetes mellitus with foot ulcer, without long-term current use of insulin ([place]   . 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 S/P debridement 07/19/2016  . Acute gout of left elbow 07/19/2016    CBeaulah Dinning PT, DPT 09/09/19 1:24 PM  C[facility]water[facility]ast[facility]GExperiment NAlaska [address]Phone: [phone]  Fax:  [phone] Name: Jon SchutterMRN: [id number] of Birth: [birth year]

## 2019-09-11 ENCOUNTER — Ambulatory Visit: Payer: Medicare Other | Attending: Physician Assistant

## 2019-09-11 DIAGNOSIS — G8929 Other chronic pain: Secondary | ICD-10-CM | POA: Insufficient documentation

## 2019-09-11 DIAGNOSIS — M25511 Pain in right shoulder: Secondary | ICD-10-CM | POA: Insufficient documentation

## 2019-09-11 DIAGNOSIS — M6281 Muscle weakness (generalized): Secondary | ICD-10-CM | POA: Insufficient documentation

## 2019-09-11 NOTE — Therapy (Signed)
[place], [place], [address] Phone: [phone]   Fax:  [phone]  Physical Therapy Treatment  Patient Details  Name: Jon Jones MRN: [id number] Date of Birth: [birth year] Referring Provider (PT): Shanon Rosser, [place]   Encounter Date: 09/11/2019   PT End of Session - 09/11/19 1446    Visit Number 17    Number of Visits 20    Date for PT Re-Evaluation 09/19/19    Authorization Type Medicaid    PT Start Time 1220    PT Stop Time 1306    PT Time Calculation (min) 46 min    Activity Tolerance Patient tolerated treatment well;Patient limited by fatigue    Behavior During Therapy [facility] for tasks assessed/performed           Past Medical History:  Diagnosis Date  . 5 years ago   . AICD (automatic cardioverter/defibrillator) present   . Anxiety   . Arthritis   . CHF (congestive heart failure) ([place])   . Coronary artery disease   . Diabetes mellitus without complication ([place])   . Dysrhythmia   . GERD (gastroesophageal reflux disease)    PMH  . Hypertension   . Peripheral vascular disease ([place])   . Peripheral vascular disease ([place])   . Pneumonia   . Shoulder impingement, right   . Sleep apnea    wears cpap  . Stomach ulcer   . Wears glasses     Past Surgical History:  Procedure Laterality Date  . AMPUTATION Left 03/12/2019   Procedure: 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;  Surgeon: [name], MD;  Location: [place];  Service: Orthopedics;  Laterality: Left;  . CARDIAC CATHETERIZATION    . CARDIAC DEFIBRILLATOR PLACEMENT    . I & D EXTREMITY Left 07/19/2016   Procedure: IRRIGATION AND DEBRIDEMENT EXTREMITY/OLECRANON(WASHOUT);  Surgeon: Leandrew Koyanagi, MD;  Location: [place];  Service: Orthopedics;  Laterality: Left;  . OLECRANON BURSECTOMY Left 07/19/2016   Procedure: LEFT ELBOW OLECRANON BURSECTOMY;  Surgeon: Leandrew Koyanagi, MD;  Location: [place];  Service: Orthopedics;  Laterality: Left;   . SHOULDER ARTHROSCOPY Right 05/16/2019   Procedure: Right Shoulder Arthroscopy;  Surgeon: [name], MD;  Location: [place];  Service: Orthopedics;  Laterality: Right;    There were no vitals filed for this visit.   Subjective Assessment - 09/11/19 1440    Subjective Pt continues to have good reports of low R shoulder pain. He reports the pain will increase with activity and exs to 4/10, but resolves.    Currently in Pain? No/denies    Pain Score 0-No pain    Pain Location Shoulder    Pain Orientation Right    Pain Descriptors / Indicators Sore    Pain Type Chronic pain    Pain Onset More than a month ago    Pain Frequency Intermittent    Aggravating Factors  repetitive r arm movements    Pain Relieving Factors rets, medications    Multiple Pain Sites No                             [facility] Adult PT Treatment/Exercise - 09/11/19 0001      Self-Care   Self-Care Other Self-Care Comments    Other Self-Care Comments  Review of R shoulder HEP      Shoulder Exercises: Supine   Protraction Limitations serratus punch 2x10 right side  Horizontal ABduction AROM;Strengthening;Both;20 reps    Theraband Level (Shoulder Horizontal ABduction) Level 2 (Red)    Horizontal ABduction Limitations 2 sets      Shoulder Exercises: Sidelying   External Rotation AROM;Right;10 reps    External Rotation Limitations 3 sets    ABduction AROM;Strengthening;Right;10 reps    ABduction Limitations 3 sets; plane of scaption      Shoulder Exercises: Standing   Internal Rotation AROM;Strengthening;Right;12 reps    Theraband Level (Shoulder Internal Rotation) Level 3 (Green)    Internal Rotation Limitations 3 sets    Flexion Limitations 2x10 with 1 lb.    Extension Strengthening;Theraband;Left;10 reps    Theraband Level (Shoulder Extension) Level 3 (Green)    Extension Limitations 3 sets      Shoulder Exercises: ROM/Strengthening   Wall Wash 10x2 c mini-squat for facilitation     Wall Pushups 20 reps      Shoulder Exercises: Isometric Strengthening   Flexion Limitations 5 sec holds x 10 reps    ABduction Limitations 5 sec holds x10 reps                  PT Education - 09/11/19 1445    Education Details HEP for r shoulder strengthening and ROM    Person(s) Educated Patient    Methods Explanation;Demonstration;Tactile cues;Verbal cues;Handout    Comprehension Verbalized understanding;Returned demonstration            PT Short Term Goals - 08/07/19 1110      PT SHORT TERM GOAL #2   Title Pt will demonstrate R shoulder ER strength to neutral in L SL. MET    Baseline -30d less than neutral in L SL    Status Achieved             PT Long Term Goals - 08/07/19 1111      PT LONG TERM GOAL #1   Title Improve R shoulder ER strength to 3/5 for improved functional use of the R UE.ON-GOING: 3-/5    Baseline 2/5    Time 8    Period Weeks    Status New    Target Date 09/03/19      PT LONG TERM GOAL #2   Title Improve AROM for R flex to 90d, abd to 100d in standing and ER to 10d in L SL  for improvedfunctional use of the R UE. MET: Flex 155d, abd 150d, ER 0d.    Baseline Flex 50d, abd 70d, ER -30    Time 8    Period Weeks    Status Achieved    Target Date 09/03/19      PT LONG TERM GOAL #3   Title Pt will report a decrease in R shoulder pain c forward and above head reaching with a range of 0-3/10. PARTIALLY MET: Intermittent 5/10    Baseline 5/10 constant    Time 8    Period Weeks    Status Partially Met    Target Date 09/03/19      PT LONG TERM GOAL #4   Title Pt will be Ind a final HEP to inprove pt's R shoulder AROM, strength and functional use.    Baseline In process    Time 8    Period Weeks    Status New    Target Date 09/03/19      PT LONG TERM GOAL #5   Title Pt will demonstrate R shoulder flex and abd strength of at least 4/5 strength for improved functional use of  the R UE. 08/06/19= 3+/5    Baseline 2/5    Time 8    Period  Weeks    Status New    Target Date 09/03/19                 Plan - 09/11/19 1456    Clinical Impression Statement Continued review of HEP c verbal and tactile cueing as needed. Pt paricipated with good effort. Overall, pain level and functional use of the R sh/UE are improved.    Personal Factors and Comorbidities Comorbidity 3+    Comorbidities CHF,anxiety, DM    Examination-Activity Limitations Carry;Lift;Reach Overhead;Self Feeding    Stability/Clinical Decision Making Evolving/Moderate complexity    Clinical Decision Making Moderate    Rehab Potential Poor    PT Frequency 2x / week    PT Duration 6 weeks    PT Treatment/Interventions Moist Heat;Therapeutic activities;Therapeutic exercise;Manual techniques;Taping;Dry needling;Passive range of motion;Patient/family education;Electrical Stimulation;Iontophoresis 43m/ml Dexamethasone    PT Next Visit Plan Continue R sh/scapular strengthening and development/finalization of HEP. t will missed the next 2 scheduled appts due to being out of town    PT HM[place]and Agree with Plan of Care Patient           Patient will benefit from skilled therapeutic intervention in order to improve the following deficits and impairments:  Postural dysfunction, Improper body mechanics, Decreased range of motion, Pain, Impaired UE functional use, Decreased strength, Obesity  Visit Diagnosis: Muscle weakness (generalized)  Chronic pain in right shoulder     Problem List Patient Active Problem List   Diagnosis Date Noted  . Impingement syndrome of right shoulder   . Nontraumatic complete tear of right rotator cuff   . Osteomyelitis ([place] 03/11/2019  . Osteomyelitis of fifth toe of left foot ([place]   . Type 2 diabetes mellitus with foot ulcer, without long-term current use of insulin ([place]   . 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 S/P debridement 07/19/2016  . Acute gout of left  elbow 07/19/2016    AGar PontoMS, PT 09/11/19 3:05 PM  [facility]rancon[facility] [address]Phone: [phone]  Fax:  [phone] Name: OChazz PhilsonMRN: [id number] of Birth: [birth year]

## 2019-09-16 ENCOUNTER — Ambulatory Visit: Payer: Medicare Other | Admitting: Physical Therapy

## 2019-09-18 ENCOUNTER — Ambulatory Visit: Payer: Medicare Other | Admitting: Physical Therapy

## 2019-09-18 DIAGNOSIS — G4733 Obstructive sleep apnea (adult) (pediatric): Secondary | ICD-10-CM | POA: Diagnosis not present

## 2019-09-22 ENCOUNTER — Other Ambulatory Visit: Payer: Self-pay

## 2019-09-22 ENCOUNTER — Encounter: Payer: Self-pay | Admitting: Physical Therapy

## 2019-09-22 ENCOUNTER — Ambulatory Visit: Payer: Medicare Other | Admitting: Physical Therapy

## 2019-09-22 DIAGNOSIS — M25511 Pain in right shoulder: Secondary | ICD-10-CM | POA: Diagnosis not present

## 2019-09-22 DIAGNOSIS — M6281 Muscle weakness (generalized): Secondary | ICD-10-CM

## 2019-09-22 DIAGNOSIS — G8929 Other chronic pain: Secondary | ICD-10-CM | POA: Diagnosis not present

## 2019-09-22 NOTE — Therapy (Signed)
[place], [place], [address] Phone: [phone]   Fax:  [phone]  Physical Therapy Treatment/Recertification  Patient Details  Name: Jon Jones MRN: [id number] Date of Birth: Mar 17, 1955 Referring Provider (PT): Bevely Palmer Persons, [place]   Encounter Date: 09/22/2019   PT End of Session - 09/22/19 1506    Visit Number 18    Number of Visits 20    Date for PT Re-Evaluation 09/23/19    Authorization Type Medicaid    Authorization Time Period 08/13/19-09/23/19    Authorization - Visit Number 10    Authorization - Number of Visits 12    PT Start Time 1502    PT Stop Time 1540    PT Time Calculation (min) 38 min    Activity Tolerance Patient tolerated treatment well;Patient limited by fatigue    Behavior During Therapy [facility] for tasks assessed/performed           Past Medical History:  Diagnosis Date  . 5 years ago   . AICD (automatic cardioverter/defibrillator) present   . Anxiety   . Arthritis   . CHF (congestive heart failure) ([place])   . Coronary artery disease   . Diabetes mellitus without complication ([place])   . Dysrhythmia   . GERD (gastroesophageal reflux disease)    PMH  . Hypertension   . Peripheral vascular disease ([place])   . Peripheral vascular disease ([place])   . Pneumonia   . Shoulder impingement, right   . Sleep apnea    wears cpap  . Stomach ulcer   . Wears glasses     Past Surgical History:  Procedure Laterality Date  . AMPUTATION Left 03/12/2019   Procedure: 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;  Surgeon: [name], MD;  Location: [place];  Service: Orthopedics;  Laterality: Left;  . CARDIAC CATHETERIZATION    . CARDIAC DEFIBRILLATOR PLACEMENT    . I & D EXTREMITY Left 07/19/2016   Procedure: IRRIGATION AND DEBRIDEMENT EXTREMITY/OLECRANON(WASHOUT);  Surgeon: Leandrew Koyanagi, MD;  Location: [place];  Service: Orthopedics;  Laterality: Left;  . OLECRANON BURSECTOMY Left 07/19/2016   Procedure: LEFT  ELBOW OLECRANON BURSECTOMY;  Surgeon: Leandrew Koyanagi, MD;  Location: [place];  Service: Orthopedics;  Laterality: Left;  . SHOULDER ARTHROSCOPY Right 05/16/2019   Procedure: Right Shoulder Arthroscopy;  Surgeon: [name], MD;  Location: [place];  Service: Orthopedics;  Laterality: Right;    There were no vitals filed for this visit.   Subjective Assessment - 09/22/19 1503    Subjective Pt. rates right shoulder pain 2/10 this PM. He continues to note some issues with crepitus when moving right arm.    Limitations House hold activities;Other (comment)              [facility] PT Assessment - 09/22/19 0001      Assessment   Medical Diagnosis Nontramatic complete tear r rotator cuff    Referring Provider (PT) Bevely Palmer Persons, PA    Onset Date/Surgical Date 05/16/19      AROM   Right Shoulder Flexion 150 Degrees    Right Shoulder ABduction 140 Degrees    Right Shoulder Internal Rotation --   reach to gluteals   Right Shoulder External Rotation --   reach to rigth ear     Strength   Right Shoulder Flexion 4/5    Right Shoulder ABduction 4/5    Right Shoulder Internal Rotation 5/5    Right Shoulder External Rotation 3-/5  [place] Adult PT Treatment/Exercise - 09/22/19 0001      Shoulder Exercises: Supine   Protraction Limitations serratus punch 2x10 right side with 1 lb.    Horizontal ABduction AROM;Strengthening;Both;20 reps    Theraband Level (Shoulder Horizontal ABduction) Level 2 (Red)    Horizontal ABduction Limitations 2 sets    Flexion Limitations short arc flexion AROM "punch" 2x10    Other Supine Exercises supine wand flexion 2x10    Other Supine Exercises supine rhytmic stabilization at 90 deg flexion 20 sec x 3      Shoulder Exercises: Sidelying   External Rotation AROM;Right;10 reps    External Rotation Limitations 3 sets    ABduction AROM;Strengthening;Right;20 reps      Shoulder Exercises: Standing    Internal Rotation AROM;Strengthening;Right;12 reps    Theraband Level (Shoulder Internal Rotation) Level 3 (Green)    Internal Rotation Limitations 3 sets    Extension Limitations standing DB extension 2 lbs. 2x10    Row Limitations standing DB row 3 lbs. 3x10    Other Standing Exercises UE ranger flexion AAROM 3x10, scaption 2x10      Shoulder Exercises: ROM/Strengthening   UBE (Upper Arm Bike) L1 x 6 min alternating 1 min ea. fw/rev    Wall Wash 10x3 c mini-squat for facilitation    Wall Pushups 20 reps                    PT Short Term Goals - 08/07/19 1110      PT SHORT TERM GOAL #2   Title Pt will demonstrate R shoulder ER strength to neutral in L SL. MET    Baseline -30d less than neutral in L SL    Status Achieved             PT Long Term Goals - 09/22/19 1511      PT LONG TERM GOAL #1   Title Improve R shoulder ER strength to 3/5 for improved functional use of the R UE.ON-GOING: 3-/5    Baseline 3-/5    Time 8    Period Weeks    Status On-going    Target Date 09/23/19      PT LONG TERM GOAL #2   Title met except ER    Time 8    Period Weeks    Status Partially Met    Target Date 09/23/19      PT LONG TERM GOAL #3   Title Pt will report a decrease in R shoulder pain c forward and above head reaching with a range of 0-3/10. PARTIALLY MET: Intermittent 5/10    Time 8    Period Weeks    Status Partially Met    Target Date 09/23/19      PT LONG TERM GOAL #4   Title Pt will be Ind a final HEP to inprove pt's R shoulder AROM, strength and functional use.    Baseline In process    Time 8    Period Weeks    Status On-going    Target Date 09/23/19      PT LONG TERM GOAL #5   Title Pt will demonstrate R shoulder flex and abd strength of at least 4/5 strength for improved functional use of the R UE. 08/06/19= 3+/5    Baseline met    Time 8    Period Weeks    Status Achieved    Target Date 09/23/19  Plan - 09/22/19 1506     Clinical Impression Statement Fair progress with therapy. Pt. has made progress with right shoulder flexion and abduction ROM with reaching ability but still with weakness in particular in ER consistent with underlying cuff tear. His c/o crepitus also likely consistent with underlying OA. Plan one more visit with therapy after today's visit then d/c to HEP and he will continue with (HEP) and follow up with Dr. Sharol Given with any ongoing issues by next year for consideration TSA.    Personal Factors and Comorbidities Comorbidity 3+    Comorbidities CHF,anxiety, DM    Examination-Activity Limitations Carry;Lift;Reach Overhead;Self Feeding    Stability/Clinical Decision Making Evolving/Moderate complexity    Clinical Decision Making Moderate    Rehab Potential Poor    PT Frequency 2x / week    PT Duration 6 weeks    PT Treatment/Interventions Moist Heat;Therapeutic activities;Therapeutic exercise;Manual techniques;Taping;Dry needling;Passive range of motion;Patient/family education;Electrical Stimulation;Iontophoresis 14m/ml Dexamethasone    PT Next Visit Plan d/c next session    PT Home Exercise Plan [id number]    Consulted and Agree with Plan of Care Patient           Patient will benefit from skilled therapeutic intervention in order to improve the following deficits and impairments:  Postural dysfunction, Improper body mechanics, Decreased range of motion, Pain, Impaired UE functional use, Decreased strength, Obesity  Visit Diagnosis: Muscle weakness (generalized)  Chronic pain in right shoulder     Problem List Patient Active Problem List   Diagnosis Date Noted  . Impingement syndrome of right shoulder   . Nontraumatic complete tear of right rotator cuff   . Osteomyelitis ([place] 03/11/2019  . Osteomyelitis of fifth toe of left foot (H[facility]   . Type 2 diabetes mellitus with foot ulcer, without long-term current use of insulin ([place]   . 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 S/P debridement 07/19/2016  . Acute gout of left elbow 07/19/2016    CBeaulah Dinning PT, DPT 09/22/19 3:40 PM  CSt[facility]ixC[facility]18[address]GNarrowsburg N[place] [address]Phone: [phone]  Fax:  [phone] Name: Jon SadowskyMRN: [id number] of Birth: 604-29-1957

## 2019-09-23 ENCOUNTER — Ambulatory Visit: Payer: Medicare Other

## 2019-09-23 DIAGNOSIS — M6281 Muscle weakness (generalized): Secondary | ICD-10-CM | POA: Diagnosis not present

## 2019-09-23 DIAGNOSIS — M25511 Pain in right shoulder: Secondary | ICD-10-CM

## 2019-09-23 DIAGNOSIS — G8929 Other chronic pain: Secondary | ICD-10-CM | POA: Diagnosis not present

## 2019-09-24 NOTE — Therapy (Addendum)
[place], [place], [address] Phone: [phone]   Fax:  [phone]  Physical Therapy Treatment/PHYSICAL THERAPY DISCHARGE SUMMARY Name: Jon Jones MRN: [id number] Date of Birth: 1955-11-25 Referring Provider (PT): Bevely Palmer Persons, [place]   Encounter Date: 09/23/2019   PT End of Session - 09/24/19 1239    Visit Number 19    Number of Visits 20    Authorization Type Medicaid    PT Start Time 5361    PT Stop Time 4431    PT Time Calculation (min) 42 min    Activity Tolerance Patient tolerated treatment well;Patient limited by fatigue    Behavior During Therapy [facility] for tasks assessed/performed           Past Medical History:  Diagnosis Date  . 5 years ago   . AICD (automatic cardioverter/defibrillator) present   . Anxiety   . Arthritis   . CHF (congestive heart failure) ([place])   . Coronary artery disease   . Diabetes mellitus without complication ([place])   . Dysrhythmia   . GERD (gastroesophageal reflux disease)    PMH  . Hypertension   . Peripheral vascular disease ([place])   . Peripheral vascular disease ([place])   . Pneumonia   . Shoulder impingement, right   . Sleep apnea    wears cpap  . Stomach ulcer   . Wears glasses     Past Surgical History:  Procedure Laterality Date  . AMPUTATION Left 03/12/2019   Procedure: 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;  Surgeon: [name], MD;  Location: [place];  Service: Orthopedics;  Laterality: Left;  . CARDIAC CATHETERIZATION    . CARDIAC DEFIBRILLATOR PLACEMENT    . I & D EXTREMITY Left 07/19/2016   Procedure: IRRIGATION AND DEBRIDEMENT EXTREMITY/OLECRANON(WASHOUT);  Surgeon: Leandrew Koyanagi, MD;  Location: [place];  Service: Orthopedics;  Laterality: Left;  . OLECRANON BURSECTOMY Left 07/19/2016   Procedure: LEFT ELBOW OLECRANON BURSECTOMY;  Surgeon: Leandrew Koyanagi, MD;  Location: [place];  Service: Orthopedics;  Laterality: Left;  . SHOULDER ARTHROSCOPY  Right 05/16/2019   Procedure: Right Shoulder Arthroscopy;  Surgeon: [name], MD;  Location: [place];  Service: Orthopedics;  Laterality: Right;    There were no vitals filed for this visit.   Subjective Assessment - 09/23/19 1538    Subjective Pt rates his R shoulder pain range as 0-4, wrose in the AM. Pain improves during the day.    Limitations House hold activities;Other (comment)   Decreased R UE strength c overhead activities   Patient Stated Goals To improve my use of my R arm and to have decreased pain    Currently in Pain? No/denies    Pain Score 0-No pain    Pain Location Shoulder    Pain Orientation Right    Pain Descriptors / Indicators Sore   When he wakes up in the AM   Pain Type Chronic pain    Pain Onset More than a month ago    Aggravating Factors  rerpetitive use of R UE or with weight above shoulder height    Pain Relieving Factors Rest, medication    Effect of Pain on Daily Activities Limits use of R UE above shoulder                             [facility] Adult PT Treatment/Exercise - 09/24/19 0001      Self-Care  Self-Care Other Self-Care Comments    Other Self-Care Comments  Completion and review of final HEP      Exercises   Exercises Shoulder      Shoulder Exercises: Supine   Protraction Limitations serratus punch 2x10 right side with 1 lb.    Horizontal ABduction AROM;Strengthening;Both;20 reps    Theraband Level (Shoulder Horizontal ABduction) Level 2 (Red)    Horizontal ABduction Limitations 2 sets    Flexion Limitations short arc flexion AROM "punch" 2x10    Other Supine Exercises supine wand flexion 2x10      Shoulder Exercises: Sidelying   External Rotation AROM;Right;10 reps    External Rotation Limitations 3 sets    ABduction AROM;Strengthening;Right;20 reps    ABduction Limitations 3 sets; plane of scaption      Shoulder Exercises: Standing   Internal Rotation AROM;Strengthening;Right;12 reps    Theraband Level  (Shoulder Internal Rotation) Level 3 (Green)    Internal Rotation Limitations 3 sets    Flexion Limitations 2x10 with 1 lb.    Theraband Level (Shoulder Extension) Level 3 (Green)    Extension Limitations standing DB extension 2 lbs. 2x10    Row Strengthening;Theraband;Right;10 reps    Theraband Level (Shoulder Row) Level 3 (Green)    Row Weight (lbs) 3 sets    Row Limitations standing DB row 3 lbs. 3x10      Shoulder Exercises: ROM/Strengthening   UBE (Upper Arm Bike) L1 x 6 min alternating 1 min ea. fw/rev    Wall Pushups 20 reps                  PT Education - 09/24/19 1238    Education Details Completion and review of final HEP    Person(s) Educated Patient    Methods Explanation;Demonstration;Verbal cues    Comprehension Returned demonstration;Verbalized understanding            PT Short Term Goals - 08/07/19 1110      PT SHORT TERM GOAL #2   Title Pt will demonstrate R shoulder ER strength to neutral in L SL. MET    Baseline -30d less than neutral in L SL    Status Achieved             PT Long Term Goals - 09/24/19 1249      PT LONG TERM GOAL #4   Title Pt will be Ind a final HEP to inprove pt's R shoulder AROM, strength and functional use.MET    Status Achieved    Target Date 09/23/19                 Plan - 09/24/19 1242    Clinical Impression Statement Pt demonstrates appropriate understanding his HEP, completing his exs correctly. Pt is to complete HEP 3x per week.Pt is DCed from PT c goals met. Pt is to f/u wih Dr. Sharol Given re: further R shoulder issues and in 1 year for assessment for TSA.    Personal Factors and Comorbidities Comorbidity 3+    Comorbidities CHF,anxiety, DM    Examination-Activity Limitations Carry;Lift;Reach Overhead;Self Feeding    Stability/Clinical Decision Making Evolving/Moderate complexity    Clinical Decision Making Moderate    Rehab Potential Poor    PT Frequency 2x / week    PT Duration 6 weeks    PT  Treatment/Interventions Moist Heat;Therapeutic activities;Therapeutic exercise;Manual techniques;Taping;Dry needling;Passive range of motion;Patient/family education;Electrical Stimulation;Iontophoresis 36m/ml Dexamethasone    [facility]   PT Home Exercise Plan [id number]  Consulted and Agree with Plan of Care Patient           Patient will benefit from skilled therapeutic intervention in order to improve the following deficits and impairments:  Postural dysfunction, Improper body mechanics, Decreased range of motion, Pain, Impaired UE functional use, Decreased strength, Obesity  Visit Diagnosis: Muscle weakness (generalized)  Chronic pain in right shoulder     Problem List Patient Active Problem List   Diagnosis Date Noted  . Impingement syndrome of right shoulder   . Nontraumatic complete tear of right rotator cuff   . Osteomyelitis ([place]) 03/11/2019  . Osteomyelitis of fifth toe of left foot ([place])   . Type 2 diabetes mellitus with foot ulcer, without long-term current use of insulin ([place])   . 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 S/P debridement 07/19/2016  . Acute gout of left elbow 07/19/2016    PHYSICAL THERAPY DISCHARGE SUMMARY  Visits from Start of Care: 19  Current functional level related to goals / functional outcomes: See above   Remaining deficits: See above   Education / Equipment: HEP Plan: Patient agrees to discharge.  Patient goals were not met. Patient is being discharged due to being pleased with the current functional level.  ?????   Gar Ponto MS, PT 09/24/19 1:04 PM           [facility] [address] [place], [place], [address] Phone: [phone]   Fax:  [phone]  Name: Jon Jones MRN: [id number] Date of Birth: 02/20/1956

## 2019-09-29 ENCOUNTER — Ambulatory Visit: Payer: Medicare Other | Admitting: Physical Therapy

## 2019-10-05 ENCOUNTER — Other Ambulatory Visit: Payer: Self-pay | Admitting: Physician Assistant

## 2019-10-05 DIAGNOSIS — Z95810 Presence of automatic (implantable) cardiac defibrillator: Secondary | ICD-10-CM | POA: Diagnosis not present

## 2019-10-06 ENCOUNTER — Ambulatory Visit: Payer: Self-pay

## 2019-10-06 ENCOUNTER — Ambulatory Visit (INDEPENDENT_AMBULATORY_CARE_PROVIDER_SITE_OTHER): Payer: Medicaid Other | Admitting: Physical Medicine and Rehabilitation

## 2019-10-06 ENCOUNTER — Other Ambulatory Visit: Payer: Self-pay

## 2019-10-06 ENCOUNTER — Encounter: Payer: Self-pay | Admitting: Physical Medicine and Rehabilitation

## 2019-10-06 VITALS — BP 117/72 | HR 78

## 2019-10-06 DIAGNOSIS — M5416 Radiculopathy, lumbar region: Secondary | ICD-10-CM

## 2019-10-06 MED ORDER — METHYLPREDNISOLONE ACETATE 80 MG/ML IJ SUSP
80.0000 mg | Freq: Once | INTRAMUSCULAR | Status: AC
  Administered 2019-10-06: 80 mg

## 2019-10-06 NOTE — Progress Notes (Signed)
Pt States lower back down left leg. Pt states resting help with the pain.  Numeric Pain Rating Scale and Functional Assessment Average Pain 3   In the last MONTH (on 0-10 scale) has pain interfered with the following?  1. General activity like being  able to carry out your everyday physical activities such as walking, climbing stairs, carrying groceries, or moving a chair?  Rating(7)   +Driver, +BT, -Dye Allergies.

## 2019-10-20 DIAGNOSIS — G4733 Obstructive sleep apnea (adult) (pediatric): Secondary | ICD-10-CM | POA: Diagnosis not present

## 2019-11-05 NOTE — Progress Notes (Signed)
Jon Jones - 64 y.o. male MRN [id number]  Date of birth: [birth year]  Office Visit Note: Visit Date: 10/06/2019 PCP: System, Provider Not In Referred by: Persons, [place], [place]  Subjective: No chief complaint on file.  HPI:  Jon Jones is a 64 y.o. male who comes in today at the request of [place] Persons, PA-C for planned Left L5-S1 Lumbar epidural steroid injection with fluoroscopic guidance.  The patient has failed conservative care including home exercise, medications, time and activity modification.  This injection will be diagnostic and hopefully therapeutic.  Please see requesting physician notes for further details and justification.  If not much help then needs MRI or CT scan.  ROS Otherwise per HPI.  Assessment & Plan: Visit Diagnoses:  1. Lumbar radiculopathy     Plan: No additional findings.   Meds & Orders:  Meds ordered this encounter  Medications  . methylPREDNISolone acetate (DEPO-MEDROL) injection 80 mg    Orders Placed This Encounter  Procedures  . XR C-ARM NO REPORT  . Epidural Steroid injection    Follow-up: Return if symptoms worsen or fail to improve.   Procedures: No procedures performed  Lumbosacral Transforaminal Epidural Steroid Injection - Sub-Pedicular Approach with Fluoroscopic Guidance  [name]      Date of Birth: 12-Sep-1955 MRN: [id number] PCP: System, Provider Not In      Visit Date: 10/06/2019   Universal Protocol:    Date/Time: 10/06/2019  Consent Given By: the patient  Position: PRONE  Additional Comments: Vital signs were monitored before and after the procedure. Patient was prepped and draped in the usual sterile fashion. The correct patient, procedure, and site was verified.   Injection Procedure Details:  Procedure Site One Meds Administered:  Meds ordered this encounter  Medications  . methylPREDNISolone acetate (DEPO-MEDROL) injection 80 mg    Laterality: Left  Location/Site:  L5-S1  Needle  size: 22 G  Needle type: Spinal  Needle Placement: Transforaminal  Findings:    -Comments: Excellent flow of contrast along the nerve, nerve root and into the epidural space.  Procedure Details: After squaring off the end-plates to get a true AP view, the C-arm was positioned so that an oblique view of the foramen as noted above was visualized. The target area is just inferior to the "nose of the scotty dog" or sub pedicular. The soft tissues overlying this structure were infiltrated with 2-3 ml. of 1% Lidocaine without Epinephrine.  The spinal needle was inserted toward the target using a "trajectory" view along the fluoroscope beam.  Under AP and lateral visualization, the needle was advanced so it did not puncture dura and was located close the 6 O'Clock position of the pedical in AP tracterory. Biplanar projections were used to confirm position. Aspiration was confirmed to be negative for CSF and/or blood. A 1-2 ml. volume of Isovue-250 was injected and flow of contrast was noted at each level. Radiographs were obtained for documentation purposes.   After attaining the desired flow of contrast documented above, a 0.5 to 1.0 ml test dose of 0.25% Marcaine was injected into each respective transforaminal space.  The patient was observed for 90 seconds post injection.  After no sensory deficits were reported, and normal lower extremity motor function was noted,   the above injectate was administered so that equal amounts of the injectate were placed at each foramen (level) into the transforaminal epidural space.   Additional Comments:  The patient tolerated the procedure well Dressing: 2 x 2 sterile  gauze and Band-Aid    Post-procedure details: Patient was observed during the procedure. Post-procedure instructions were reviewed.  Patient left the clinic in stable condition.     Clinical History: 07/14/2019 2 view radiographs of the lumbar spine shows advanced degenerative disc   disease with a grade 1 spondylolisthesis at L5-S1 anterior chronic  compression at T12-L1 and L2 with joint space narrowing anterior bony  spurs and calcification of the aorta without aneurysm.     Objective:  VS:  HT:    WT:   BMI:     BP:117/72  HR:78bpm  TEMP: ( )  RESP:  Physical Exam Constitutional:      General: He is not in acute distress.    Appearance: Normal appearance. He is not ill-appearing.  HENT:     Head: Normocephalic and atraumatic.     Right Ear: External ear normal.     Left Ear: External ear normal.  Eyes:     Extraocular Movements: Extraocular movements intact.  Cardiovascular:     Rate and Rhythm: Normal rate.     Pulses: Normal pulses.  Abdominal:     General: There is no distension.     Palpations: Abdomen is soft.  Musculoskeletal:        General: No tenderness or signs of injury.     Right lower leg: No edema.     Left lower leg: No edema.     Comments: Patient has good distal strength without clonus.  Skin:    Findings: No erythema or rash.  Neurological:     General: No focal deficit present.     Mental Status: He is alert and oriented to person, place, and time.     Sensory: No sensory deficit.     Motor: No weakness or abnormal muscle tone.     Coordination: Coordination normal.  Psychiatric:        Mood and Affect: Mood normal.        Behavior: Behavior normal.      Imaging: No results found.

## 2019-11-05 NOTE — Procedures (Signed)
Lumbosacral Transforaminal Epidural Steroid Injection - Sub-Pedicular Approach with Fluoroscopic Guidance  [name]      Date of Birth: 15-Nov-1955 MRN: [id number] PCP: System, Provider Not In      Visit Date: 10/06/2019   Universal Protocol:    Date/Time: 10/06/2019  Consent Given By: the patient  Position: PRONE  Additional Comments: Vital signs were monitored before and after the procedure. Patient was prepped and draped in the usual sterile fashion. The correct patient, procedure, and site was verified.   Injection Procedure Details:  Procedure Site One Meds Administered:  Meds ordered this encounter  Medications  . methylPREDNISolone acetate (DEPO-MEDROL) injection 80 mg    Laterality: Left  Location/Site:  L5-S1  Needle size: 22 G  Needle type: Spinal  Needle Placement: Transforaminal  Findings:    -Comments: Excellent flow of contrast along the nerve, nerve root and into the epidural space.  Procedure Details: After squaring off the end-plates to get a true AP view, the C-arm was positioned so that an oblique view of the foramen as noted above was visualized. The target area is just inferior to the "nose of the scotty dog" or sub pedicular. The soft tissues overlying this structure were infiltrated with 2-3 ml. of 1% Lidocaine without Epinephrine.  The spinal needle was inserted toward the target using a "trajectory" view along the fluoroscope beam.  Under AP and lateral visualization, the needle was advanced so it did not puncture dura and was located close the 6 O'Clock position of the pedical in AP tracterory. Biplanar projections were used to confirm position. Aspiration was confirmed to be negative for CSF and/or blood. A 1-2 ml. volume of Isovue-250 was injected and flow of contrast was noted at each level. Radiographs were obtained for documentation purposes.   After attaining the desired flow of contrast documented above, a 0.5 to 1.0 ml test  dose of 0.25% Marcaine was injected into each respective transforaminal space.  The patient was observed for 90 seconds post injection.  After no sensory deficits were reported, and normal lower extremity motor function was noted,   the above injectate was administered so that equal amounts of the injectate were placed at each foramen (level) into the transforaminal epidural space.   Additional Comments:  The patient tolerated the procedure well Dressing: 2 x 2 sterile gauze and Band-Aid    Post-procedure details: Patient was observed during the procedure. Post-procedure instructions were reviewed.  Patient left the clinic in stable condition.

## 2019-11-19 DIAGNOSIS — G4733 Obstructive sleep apnea (adult) (pediatric): Secondary | ICD-10-CM | POA: Diagnosis not present

## 2019-12-04 ENCOUNTER — Other Ambulatory Visit: Payer: Self-pay | Admitting: Physician Assistant

## 2019-12-04 DIAGNOSIS — I2510 Atherosclerotic heart disease of native coronary artery without angina pectoris: Secondary | ICD-10-CM | POA: Diagnosis not present

## 2019-12-04 DIAGNOSIS — M79604 Pain in right leg: Secondary | ICD-10-CM | POA: Diagnosis not present

## 2019-12-04 DIAGNOSIS — I70213 Atherosclerosis of native arteries of extremities with intermittent claudication, bilateral legs: Secondary | ICD-10-CM | POA: Diagnosis not present

## 2019-12-04 DIAGNOSIS — I10 Essential (primary) hypertension: Secondary | ICD-10-CM | POA: Diagnosis not present

## 2019-12-04 DIAGNOSIS — Z23 Encounter for immunization: Secondary | ICD-10-CM | POA: Diagnosis not present

## 2019-12-09 DIAGNOSIS — Z95810 Presence of automatic (implantable) cardiac defibrillator: Secondary | ICD-10-CM | POA: Diagnosis not present

## 2019-12-11 DIAGNOSIS — Z95810 Presence of automatic (implantable) cardiac defibrillator: Secondary | ICD-10-CM | POA: Diagnosis not present

## 2020-01-10 ENCOUNTER — Other Ambulatory Visit: Payer: Self-pay | Admitting: Physician Assistant

## 2020-01-19 DIAGNOSIS — G4733 Obstructive sleep apnea (adult) (pediatric): Secondary | ICD-10-CM | POA: Diagnosis not present

## 2020-02-09 DIAGNOSIS — N5201 Erectile dysfunction due to arterial insufficiency: Secondary | ICD-10-CM | POA: Diagnosis not present

## 2020-02-09 DIAGNOSIS — N471 Phimosis: Secondary | ICD-10-CM | POA: Diagnosis not present

## 2020-02-09 DIAGNOSIS — N476 Balanoposthitis: Secondary | ICD-10-CM | POA: Diagnosis not present

## 2020-02-20 DIAGNOSIS — G4733 Obstructive sleep apnea (adult) (pediatric): Secondary | ICD-10-CM | POA: Diagnosis not present

## 2020-03-09 DIAGNOSIS — Z95810 Presence of automatic (implantable) cardiac defibrillator: Secondary | ICD-10-CM | POA: Diagnosis not present

## 2020-03-22 DIAGNOSIS — G4733 Obstructive sleep apnea (adult) (pediatric): Secondary | ICD-10-CM | POA: Diagnosis not present

## 2020-03-24 DIAGNOSIS — N476 Balanoposthitis: Secondary | ICD-10-CM | POA: Diagnosis not present

## 2020-03-24 DIAGNOSIS — N471 Phimosis: Secondary | ICD-10-CM | POA: Diagnosis not present

## 2020-03-24 DIAGNOSIS — N5201 Erectile dysfunction due to arterial insufficiency: Secondary | ICD-10-CM | POA: Diagnosis not present

## 2020-04-21 DIAGNOSIS — G4733 Obstructive sleep apnea (adult) (pediatric): Secondary | ICD-10-CM | POA: Diagnosis not present

## 2020-04-26 ENCOUNTER — Ambulatory Visit: Payer: Medicaid Other | Attending: Internal Medicine | Admitting: Internal Medicine

## 2020-04-26 ENCOUNTER — Other Ambulatory Visit: Payer: Self-pay

## 2020-04-26 ENCOUNTER — Encounter: Payer: Self-pay | Admitting: Internal Medicine

## 2020-04-26 VITALS — BP 120/62 | HR 78 | Resp 16 | Ht 66.0 in | Wt 238.8 lb

## 2020-04-26 DIAGNOSIS — I739 Peripheral vascular disease, unspecified: Secondary | ICD-10-CM | POA: Insufficient documentation

## 2020-04-26 DIAGNOSIS — N3941 Urge incontinence: Secondary | ICD-10-CM

## 2020-04-26 DIAGNOSIS — I2510 Atherosclerotic heart disease of native coronary artery without angina pectoris: Secondary | ICD-10-CM

## 2020-04-26 DIAGNOSIS — E1142 Type 2 diabetes mellitus with diabetic polyneuropathy: Secondary | ICD-10-CM

## 2020-04-26 DIAGNOSIS — L97509 Non-pressure chronic ulcer of other part of unspecified foot with unspecified severity: Secondary | ICD-10-CM

## 2020-04-26 DIAGNOSIS — Z1211 Encounter for screening for malignant neoplasm of colon: Secondary | ICD-10-CM | POA: Diagnosis not present

## 2020-04-26 DIAGNOSIS — Z6838 Body mass index (BMI) 38.0-38.9, adult: Secondary | ICD-10-CM | POA: Diagnosis not present

## 2020-04-26 DIAGNOSIS — Z9989 Dependence on other enabling machines and devices: Secondary | ICD-10-CM

## 2020-04-26 DIAGNOSIS — E11621 Type 2 diabetes mellitus with foot ulcer: Secondary | ICD-10-CM | POA: Diagnosis not present

## 2020-04-26 DIAGNOSIS — G4733 Obstructive sleep apnea (adult) (pediatric): Secondary | ICD-10-CM | POA: Insufficient documentation

## 2020-04-26 DIAGNOSIS — I110 Hypertensive heart disease with heart failure: Secondary | ICD-10-CM | POA: Insufficient documentation

## 2020-04-26 DIAGNOSIS — Z125 Encounter for screening for malignant neoplasm of prostate: Secondary | ICD-10-CM

## 2020-04-26 DIAGNOSIS — E1151 Type 2 diabetes mellitus with diabetic peripheral angiopathy without gangrene: Secondary | ICD-10-CM | POA: Insufficient documentation

## 2020-04-26 DIAGNOSIS — Z7689 Persons encountering health services in other specified circumstances: Secondary | ICD-10-CM

## 2020-04-26 LAB — POCT GLYCOSYLATED HEMOGLOBIN (HGB A1C): HbA1c, POC (controlled diabetic range): 10.1 % — AB (ref 0.0–7.0)

## 2020-04-26 LAB — GLUCOSE, POCT (MANUAL RESULT ENTRY): POC Glucose: 397 mg/dl — AB (ref 70–99)

## 2020-04-26 MED ORDER — GABAPENTIN 300 MG PO CAPS: 300.0000 mg | ORAL_CAPSULE | Freq: Every day | ORAL | 5 refills | Status: DC

## 2020-04-26 NOTE — Patient Instructions (Signed)
You should purchase and use Curel lotion or Aveeno intensive care lotion on your feet to help decrease dryness.   Diabetes Mellitus and Nutrition, Adult When you have diabetes, or diabetes mellitus, it is very important to have healthy eating habits because your blood sugar (glucose) levels are greatly affected by what you eat and drink. Eating healthy foods in the right amounts, at about the same times every day, can help you:  Control your blood glucose.  Lower your risk of heart disease.  Improve your blood pressure.  Reach or maintain a healthy weight. What can affect my meal plan? Every person with diabetes is different, and each person has different needs for a meal plan. Your health care provider may recommend that you work with a dietitian to make a meal plan that is best for you. Your meal plan may vary depending on factors such as:  The calories you need.  The medicines you take.  Your weight.  Your blood glucose, blood pressure, and cholesterol levels.  Your activity level.  Other health conditions you have, such as heart or kidney disease. How do carbohydrates affect me? Carbohydrates, also called carbs, affect your blood glucose level more than any other type of food. Eating carbs naturally raises the amount of glucose in your blood. Carb counting is a method for keeping track of how many carbs you eat. Counting carbs is important to keep your blood glucose at a healthy level, especially if you use insulin or take certain oral diabetes medicines. It is important to know how many carbs you can safely have in each meal. This is different for every person. Your dietitian can help you calculate how many carbs you should have at each meal and for each snack. How does alcohol affect me? Alcohol can cause a sudden decrease in blood glucose (hypoglycemia), especially if you use insulin or take certain oral diabetes medicines. Hypoglycemia can be a life-threatening condition.  Symptoms of hypoglycemia, such as sleepiness, dizziness, and confusion, are similar to symptoms of having too much alcohol.  Do not drink alcohol if: ? Your health care provider tells you not to drink. ? You are pregnant, may be pregnant, or are planning to become pregnant.  If you drink alcohol: ? Do not drink on an empty stomach. ? Limit how much you use to:  0-1 drink a day for women.  0-2 drinks a day for men. ? Be aware of how much alcohol is in your drink. In the [place] one drink equals one 12 oz bottle of beer (355 mL), one 5 oz glass of wine (148 mL), or one 1 oz glass of hard liquor (44 mL). ? Keep yourself hydrated with water, diet soda, or unsweetened iced tea.  Keep in mind that regular soda, juice, and other mixers may contain a lot of sugar and must be counted as carbs. What are tips for following this plan? Reading food labels  Start by checking the serving size on the "Nutrition Facts" label of packaged foods and drinks. The amount of calories, carbs, fats, and other nutrients listed on the label is based on one serving of the item. Many items contain more than one serving per package.  Check the total grams (g) of carbs in one serving. You can calculate the number of servings of carbs in one serving by dividing the total carbs by 15. For example, if a food has 30 g of total carbs per serving, it would be equal to 2 servings of  amount or none of these fats. Check the number of milligrams (mg) of salt (sodium) in one serving. Most people should limit total sodium intake to less than 2,300 mg per day. Always check the nutrition information of foods labeled as "low-fat" or "nonfat." These foods may be higher in added sugar or refined carbs and should be avoided. Talk to your dietitian to identify your daily goals for nutrients listed on the label. Shopping Avoid buying canned, pre-made, or processed  foods. These foods tend to be high in fat, sodium, and added sugar. Shop around the outside edge of the grocery store. This is where you will most often find fresh fruits and vegetables, bulk grains, fresh meats, and fresh dairy. Cooking Use low-heat cooking methods, such as baking, instead of high-heat cooking methods like deep frying. Cook using healthy oils, such as olive, canola, or sunflower oil. Avoid cooking with butter, cream, or high-fat meats. Meal planning Eat meals and snacks regularly, preferably at the same times every day. Avoid going long periods of time without eating. Eat foods that are high in fiber, such as fresh fruits, vegetables, beans, and whole grains. Talk with your dietitian about how many servings of carbs you can eat at each meal. Eat 4-6 oz (112-168 g) of lean protein each day, such as lean meat, chicken, fish, eggs, or tofu. One ounce (oz) of lean protein is equal to: 1 oz (28 g) of meat, chicken, or fish. 1 egg.  cup (62 g) of tofu. Eat some foods each day that contain healthy fats, such as avocado, nuts, seeds, and fish. What foods should I eat? Fruits Berries. Apples. Oranges. Peaches. Apricots. Plums. Grapes. Mango. Papaya. Pomegranate. Kiwi. Cherries. Vegetables Lettuce. Spinach. Leafy greens, including kale, chard, collard greens, and mustard greens. Beets. Cauliflower. Cabbage. Broccoli. Carrots. Green beans. Tomatoes. Peppers. Onions. Cucumbers. Brussels sprouts. Grains Whole grains, such as whole-wheat or whole-grain bread, crackers, tortillas, cereal, and pasta. Unsweetened oatmeal. Quinoa. Brown or wild rice. Meats and other proteins Seafood. Poultry without skin. Lean cuts of poultry and beef. Tofu. Nuts. Seeds. Dairy Low-fat or fat-free dairy products such as milk, yogurt, and cheese. The items listed above may not be a complete list of foods and beverages you can eat. Contact a dietitian for more information. What foods should I  avoid? Fruits Fruits canned with syrup. Vegetables Canned vegetables. Frozen vegetables with butter or cream sauce. Grains Refined white flour and flour products such as bread, pasta, snack foods, and cereals. Avoid all processed foods. Meats and other proteins Fatty cuts of meat. Poultry with skin. Breaded or fried meats. Processed meat. Avoid saturated fats. Dairy Full-fat yogurt, cheese, or milk. Beverages Sweetened drinks, such as soda or iced tea. The items listed above may not be a complete list of foods and beverages you should avoid. Contact a dietitian for more information. Questions to ask a health care provider Do I need to meet with a diabetes educator? Do I need to meet with a dietitian? What number can I call if I have questions? When are the best times to check my blood glucose? Where to find more information: American Diabetes Association: diabetes.org Academy of Nutrition and Dietetics: www.eatright.org National Institute of Diabetes and Digestive and Kidney Diseases: www.niddk.nih.gov Association of Diabetes Care and Education Specialists: www.diabeteseducator.org Summary It is important to have healthy eating habits because your blood sugar (glucose) levels are greatly affected by what you eat and drink. A healthy meal plan will help you control your blood glucose and   maintain a healthy lifestyle. Your health care provider may recommend that you work with a dietitian to make a meal plan that is best for you. Keep in mind that carbohydrates (carbs) and alcohol have immediate effects on your blood glucose levels. It is important to count carbs and to use alcohol carefully. This information is not intended to replace advice given to you by your health care provider. Make sure you discuss any questions you have with your health care provider. Document Revised: 02/04/2019 Document Reviewed: 02/04/2019 Elsevier Patient Education  2021 [employer].  

## 2020-04-26 NOTE — Progress Notes (Addendum)
Patient ID: Jon Jones, male    DOB: [birth year]  MRN: [id number]  CC: New Patient (Initial Visit) and Diabetes   Subjective: Jon Jones is a 65 y.o. male who presents for new pt visit.  Pt is english speaking His concerns today include:  Patient with history of DM type II with neuropathy, amputation fifth left toe, former smoker, CAD with CHF and AICD, PVD, tophus gout left elbow, OSA on CPAP, OA left knee, lumbar radiculopathy  Previous PCP was at [facility] in [place].  Last seen 6-7 mths ago.  Moved to [place].  DIABETES TYPE 2 Last A1C:   Results for orders placed or performed in visit on 04/26/20  POCT glucose (manual entry)  Result Value Ref Range   POC Glucose 397 (A) 70 - 99 mg/dl  POCT glycosylated hemoglobin (Hb A1C)  Result Value Ref Range   Hemoglobin A1C     HbA1c POC (<> result, manual entry)     HbA1c, POC (prediabetic range)     HbA1c, POC (controlled diabetic range) 10.1 (A) 0.0 - 7.0 %    Med Adherence:  [x]  Yes -currently on Glucotrol 10 mg twice a day, Metformin 1 g twice a day, Januvia 50 mg daily, Jardiance 25 mg Medication side effects:  []  Yes    [x]  No Home Monitoring?  [x]  Yes    []  No Home glucose results range: once a day sometimes in mornings and at lunch.  Range 180-mid 200s Diet Adherence: not eating as healthy due to expense of foods.  Saw nutritionist 2 yrs and did not find it helpful Exercise: []  Yes    [x]  No.  Had gym membership in past and was doing well until COVID start.  When he was going to the gym he had gotten his A1c down to 7.6 Hypoglycemic episodes?: []  Yes    [x]  No Numbness of the feet? []  Yes    [x]  No.  Endorses pain b/w toes that interfere with sleep.  Currently on gabapentin 100 mg daily Retinopathy hx? []  Yes    [x]  No Last eye exam:   2-3 yrs ago Comments:   CAD/A1CD/PVD Currently taking: see medication list.  He is on Zetia, atorvastatin, aspirin, Plavix, metoprolol, lisinopril Med Adherence: [x]  Yes     []  No Medication side effects: []  Yes    [x]  No Adherence with salt restriction: not as much Home Monitoring?: []  Yes    [x]  No Monitoring Frequency: []  Yes    []  No Home BP results range: []  Yes    []  No SOB? []  Yes    [x]  No Chest Pain?: []  Yes    [x]  No; no recent use of SL Nitro Leg swelling?: []  Yes    [x]  No Headaches?: []  Yes    [x]  No Dizziness? [x]  Yes -sometimes when he gets up quickly Comments:  Tightness in calf BL when he walks. Can walk about 2 mins before he starts to feel it. Also had sciatica in past that contributes.  Given ESI last yr that helped.   OSA:  Using CPAP consistently  Patient's chart reviewed including past medical, social, family history and surgical history Patient Active Problem List   Diagnosis Date Noted  . Hypertensive heart disease with heart failure ([name]) 04/26/2020  . Type 2 diabetes mellitus with diabetic peripheral angiopathy without gangrene, without long-term current use of insulin ([name]) 04/26/2020  . Impingement syndrome of right shoulder   . Nontraumatic complete tear of right  rotator cuff   . Type 2 diabetes mellitus with foot ulcer, without long-term current use of insulin ([name])   . 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 S/P debridement 07/19/2016  . Acute gout of left elbow 07/19/2016     Current Outpatient Medications on File Prior to Visit  Medication Sig Dispense Refill  . aspirin 81 MG EC tablet Take 81 mg by mouth daily.    Marland Kitchen buPROPion (WELLBUTRIN SR) 150 MG 12 hr tablet Take 150 mg by mouth 2 (two) times daily.     . clobetasol ointment (TEMOVATE) 0.05 % Apply 1 application topically 2 (two) times daily as needed for dry skin.    Marland Kitchen clopidogrel (PLAVIX) 75 MG tablet Take 75 mg by mouth daily.    . empagliflozin (JARDIANCE) 25 MG TABS tablet Take 25 mg by mouth at bedtime.     Marland Kitchen ezetimibe (ZETIA) 10 MG tablet Take 10 mg by mouth at bedtime.     Marland Kitchen glipiZIDE (GLUCOTROL) 10 MG tablet Take 10 mg by mouth 2  (two) times daily.    . metFORMIN (GLUCOPHAGE) 1000 MG tablet Take 1,000 mg by mouth 2 (two) times daily with a meal.    . nitroGLYCERIN (NITROSTAT) 0.4 MG SL tablet Place 0.4 mg under the tongue See admin instructions. Every 5 minutes as needed for chest pain. Max of 3 doses    . sitaGLIPtin (JANUVIA) 50 MG tablet Take 50 mg by mouth daily.     Marland Kitchen spironolactone (ALDACTONE) 25 MG tablet Take 12.5 mg by mouth daily.    Marland Kitchen atorvastatin (LIPITOR) 80 MG tablet Take 80 mg by mouth every evening.    Marland Kitchen lisinopril (ZESTRIL) 10 MG tablet Take 10 mg by mouth daily.     . metoprolol succinate (TOPROL-XL) 100 MG 24 hr tablet Take 100 mg by mouth daily.      No current facility-administered medications on file prior to visit.    No Known Allergies  Social History   Socioeconomic History  . Marital status: Married    Spouse name: Not on file  . Number of children: Not on file  . Years of education: Not on file  . Highest education level: Not on file  Occupational History  . Not on file  Tobacco Use  . Smoking status: Former Smoker    Packs/day: 1.00    Years: 40.00    Pack years: 40.00    Types: Cigarettes    Quit date: 03/11/2019    Years since quitting: 1.1  . Smokeless tobacco: Never Used  Vaping Use  . Vaping Use: Never used  Substance and Sexual Activity  . Alcohol use: Not Currently  . Drug use: Yes    Types: Oxycodone  . Sexual activity: Not on file  Other Topics Concern  . Not on file  Social History Narrative  . Not on file   Social Determinants of Health   Financial Resource Strain: Not on file  Food Insecurity: Not on file  Transportation Needs: Not on file  Physical Activity: Not on file  Stress: Not on file  Social Connections: Not on file  Intimate Partner Violence: Not on file    Family History  Problem Relation Age of Onset  . Heart disease Mother   . Diabetes Mother   . Peripheral vascular disease Mother     Past Surgical History:  Procedure  Laterality Date  . AMPUTATION Left 03/12/2019   Procedure: 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;  Surgeon:  Nadara Mustarduda, Marcus V, MD;  [facility];  Service: Orthopedics;  Laterality: Left;  . CARDIAC CATHETERIZATION    . CARDIAC DEFIBRILLATOR PLACEMENT    . I & D EXTREMITY Left 07/19/2016   Procedure: IRRIGATION AND DEBRIDEMENT EXTREMITY/OLECRANON(WASHOUT);  Surgeon: Tarry KosXu, Naiping M, MD;  [facility];  Service: Orthopedics;  Laterality: Left;  . OLECRANON BURSECTOMY Left 07/19/2016   Procedure: LEFT ELBOW OLECRANON BURSECTOMY;  Surgeon: Tarry KosXu, Naiping M, MD;  Location: [facility];  Service: Orthopedics;  Laterality: Left;  . SHOULDER ARTHROSCOPY Right 05/16/2019   Procedure: Right Shoulder Arthroscopy;  Surgeon: Nadara Mustarduda, Marcus V, MD;  [facility];  Service: Orthopedics;  Laterality: Right;    ROS: Review of Systems Negative except as stated above  PHYSICAL EXAM: BP 120/62   Pulse 78   Resp 16   Ht 5\' 6"  (1.676 m)   Wt 238 lb 12.8 oz (108.3 kg)   SpO2 98%   BMI 38.54 kg/m   Wt Readings from Last 3 Encounters:  04/26/20 238 lb 12.8 oz (108.3 kg)  08/29/19 234 lb (106.1 kg)  07/14/19 234 lb (106.1 kg)   Physical Exam General appearance - alert, well appearing, obese older Hispanic male and in no distress Mental status - normal mood, behavior, speech, dress, motor activity, and thought processes Eyes - pupils equal and reactive, extraocular eye movements intact Nose - normal and patent, no erythema, discharge or polyps Mouth - mucous membranes moist, pharynx normal without lesions he wears dentures. Neck - supple, no significant adenopathy Chest - clear to auscultation, no wheezes, rales or rhonchi, symmetric air entry Heart - normal rate, regular rhythm, normal S1, S2, no murmurs, rubs, clicks or gallops Extremities - peripheral pulses normal, no pedal edema, no clubbing or cyanosis Diabetic Foot Exam - Simple   Simple Foot Form Visual Inspection See comments: Yes Sensation  Testing See comments: Yes Pulse Check Posterior Tibialis and Dorsalis pulse intact bilaterally: Yes Comments Toenails are thick, discolored and slightly overgrown.  He has amputation of the left fifth toe.  Skin on the plantar surface is very dry and cracking.  He has decreased sensation on the plantar surface of both feet.     CMP Latest Ref Rng & Units 05/16/2019 03/13/2019 03/12/2019  Glucose 70 - 99 mg/dL 409(W241(H) 119(J119(H) 478(G102(H)  BUN 8 - 23 mg/dL 95(A26(H) 19 19  Creatinine 0.61 - 1.24 mg/dL 2.131.00 0.861.01 5.780.99  Sodium 135 - 145 mmol/L 138 138 137  Potassium 3.5 - 5.1 mmol/L 4.3 4.2 4.4  Chloride 98 - 111 mmol/L 105 104 105  CO2 22 - 32 mmol/L - 23 18(L)  Calcium 8.9 - 10.3 mg/dL - 9.0 9.2  Total Protein 6.5 - 8.1 g/dL - - -  Total Bilirubin 0.3 - 1.2 mg/dL - - -  Alkaline Phos 38 - 126 U/L - - -  AST 15 - 41 U/L - - -  ALT 0 - 44 U/L - - -   Lipid Panel  No results found for: CHOL, TRIG, HDL, CHOLHDL, VLDL, LDLCALC, LDLDIRECT  CBC    Component Value Date/Time   WBC 9.5 03/13/2019 0320   RBC 4.43 03/13/2019 0320   HGB 14.6 05/16/2019 1100   HCT 43.0 05/16/2019 1100   PLT 197 03/13/2019 0320   MCV 95.7 03/13/2019 0320   MCH 30.9 03/13/2019 0320   MCHC 32.3 03/13/2019 0320   RDW 13.7 03/13/2019 0320   LYMPHSABS 2.7 03/11/2019 0740   MONOABS 0.7 03/11/2019 0740  EOSABS 0.1 03/11/2019 0740   BASOSABS 0.1 03/11/2019 0740    ASSESSMENT AND PLAN: 1. Establishing care with new doctor, encounter for   2. Type 2 diabetes mellitus with peripheral neuropathy ([name]) Not at goal. Discussed the importance of healthy eating habits, regular aerobic exercise (at least 150 minutes a week as tolerated) and medication compliance to achieve or maintain control of diabetes. -Recommend stopping Januvia and starting Trulicity or Lantus insulin.  Patient declined stating that he would prefer not to make any changes in his medications.  States that he wants to work on improving eating habits and  starting an exercise routine again.  He is agreeable to seeing the nutritionist. - POCT glucose (manual entry) - POCT glycosylated hemoglobin (Hb A1C) - Microalbumin / creatinine urine ratio - Ambulatory referral to Ophthalmology - Amb ref to Medical Nutrition Therapy-MNT - CBC - Comprehensive metabolic panel - Lipid panel  3. Class 2 severe obesity due to excess calories with serious comorbidity and body mass index (BMI) of 38.0 to 38.9 in adult [facility]) See #2 above  4. CAD in native artery Stable.  He needs to establish with a cardiologist in [place].  Continue beta-blocker, aspirin, Plavix, statin therapy. - Ambulatory referral to Cardiology  5. PAD (peripheral artery disease) [facility]) Patient requests appointment to establish with a vascular surgeon here in the area.  Continue aspirin/Plavix and atorvastatin - Ambulatory referral to Vascular Surgery  6. OSA on CPAP Encouraged him to continue using his CPAP every night  7. Screening for colon cancer Discussed colon cancer screening methods.  Patient prefers Cologuard test. - Cologuard  8. Urge incontinence 9. Prostate cancer screening -After visit and ended, patient came down the hall to the nurses station requesting prescription for incontinence supplies stating that when he gets the urge to urinate he is unable to hold it and it has been going on for several months.  Advised patient that we may be able to give medication for it.  He declined stating he would just like to get incontinence supplies for now. - PSA    Patient was given the opportunity to ask questions.  Patient verbalized understanding of the plan and was able to repeat key elements of the plan.   Orders Placed This Encounter  Procedures  . Microalbumin / creatinine urine ratio  . CBC  . Comprehensive metabolic panel  . Lipid panel  . Cologuard  . PSA  . Ambulatory referral to Ophthalmology  . Amb ref to Medical Nutrition Therapy-MNT  . Ambulatory  referral to Cardiology  . Ambulatory referral to Vascular Surgery  . POCT glucose (manual entry)  . POCT glycosylated hemoglobin (Hb A1C)     Requested Prescriptions    No prescriptions requested or ordered in this encounter    Return in about 4 months (around 08/24/2020).  Jonah Blue, MD, FACP

## 2020-04-28 NOTE — Progress Notes (Signed)
Let patient know that he does not have any significant amount of protein buildup in the urine.  Blood cell counts including red blood cell, white blood cell and platelet counts are normal.  Kidney function is good.  LDL cholesterol is normal.  Triglyceride level elevated.  Healthy eating habits and regular exercise will help to lower triglyceride.  PSA which is a screening test for prostate cancer is normal.

## 2020-04-29 ENCOUNTER — Other Ambulatory Visit: Payer: Self-pay | Admitting: Internal Medicine

## 2020-04-29 NOTE — Telephone Encounter (Signed)
Copied from CRM [id number]. Topic: General - Other >> Apr 29, 2020  2:46 PM Jaquita Rector A wrote: Reason for CRM: Patient called in to inquire of medication discussed at visit with Dr Laural Benes a few days ago for his dry feet. Also need all his refills in 90 day supply please Ph# [phone]

## 2020-04-29 NOTE — Telephone Encounter (Signed)
Requested medication (s) are due for refill today: Yes  Requested medication (s) are on the active medication list: Yes  Last refill:  1-2 years ago  Future visit scheduled: Yes  Notes to clinic:  Unable to refill per protocol, Rx expired, last refill by historical provider     Requested Prescriptions  Pending Prescriptions Disp Refills   glipiZIDE (GLUCOTROL) 10 MG tablet      Sig: Take 1 tablet (10 mg total) by mouth 2 (two) times daily.      Endocrinology:  Diabetes - Sulfonylureas Failed - 04/29/2020  3:00 PM      Failed - HBA1C is between 0 and 7.9 and within 180 days    HbA1c, POC (controlled diabetic range)  Date Value Ref Range Status  04/26/2020 10.1 (A) 0.0 - 7.0 % Final          Passed - Valid encounter within last 6 months    Recent Outpatient Visits           3 days ago Establishing care with new doctor, encounter for   [place], MD       Future Appointments             In 3 months Ladell Pier, MD [place]               metFORMIN (GLUCOPHAGE) 1000 MG tablet      Sig: Take 1 tablet (1,000 mg total) by mouth 2 (two) times daily with a meal.      Endocrinology:  Diabetes - Biguanides Failed - 04/29/2020  3:00 PM      Failed - HBA1C is between 0 and 7.9 and within 180 days    HbA1c, POC (controlled diabetic range)  Date Value Ref Range Status  04/26/2020 10.1 (A) 0.0 - 7.0 % Final          Passed - Cr in normal range and within 360 days    Creatinine, Ser  Date Value Ref Range Status  04/26/2020 1.19 0.76 - 1.27 mg/dL Final          Passed - eGFR in normal range and within 360 days    GFR calc Af Amer  Date Value Ref Range Status  04/26/2020 75 >59 mL/min/1.73 Final    Comment:    **In accordance with recommendations from the NKF-ASN Task force,**   [facility] is in the process of updating its eGFR calculation to the   2021 CKD-EPI creatinine  equation that estimates kidney function   without a race variable.    GFR calc non Af Amer  Date Value Ref Range Status  04/26/2020 65 >59 mL/min/1.73 Final          Passed - Valid encounter within last 6 months    Recent Outpatient Visits           3 days ago Establishing care with new doctor, encounter for   [place], MD       Future Appointments             In 3 months Ladell Pier, MD [place]               sitaGLIPtin (JANUVIA) 50 MG tablet      Sig: Take 1 tablet (50 mg total) by mouth daily.      Endocrinology:  Diabetes - DPP-4  Inhibitors Failed - 04/29/2020  3:00 PM      Failed - HBA1C is between 0 and 7.9 and within 180 days    HbA1c, POC (controlled diabetic range)  Date Value Ref Range Status  04/26/2020 10.1 (A) 0.0 - 7.0 % Final          Passed - Cr in normal range and within 360 days    Creatinine, Ser  Date Value Ref Range Status  04/26/2020 1.19 0.76 - 1.27 mg/dL Final          Passed - Valid encounter within last 6 months    Recent Outpatient Visits           3 days ago Establishing care with new doctor, encounter for   [place], MD       Future Appointments             In 3 months Ladell Pier, MD [place]               clobetasol ointment (TEMOVATE) 0.05 % 30 g     Sig: Apply 1 application topically 2 (two) times daily as needed.      Dermatology:  Corticosteroids Passed - 04/29/2020  3:00 PM      Passed - Valid encounter within last 12 months    Recent Outpatient Visits           3 days ago Establishing care with new doctor, encounter for   [place], MD       Future Appointments             In 3 months Wynetta Emery Dalbert [place], MD [place]

## 2020-04-29 NOTE — Telephone Encounter (Signed)
Copied from CRM [id number]. Topic: Quick Communication - Rx Refill/Question >> Apr 29, 2020  2:41 PM Jaquita Rector A wrote: Medication: metFORMIN (GLUCOPHAGE) 1000 MG tablet, glipiZIDE (GLUCOTROL) 10 MG tablet, sitaGLIPtin (JANUVIA) 50 MG tablet, sitaGLIPtin (JANUVIA) 50 MG tablet, clobetasol ointment (TEMOVATE) 0.05 %,   Has the patient contacted their pharmacy? Yes.   (Agent: If no, request that the patient contact the pharmacy for the refill.) (Agent: If yes, when and what did the pharmacy advise?)  Preferred Pharmacy (with phone number or street name): [facility] [phone] - [place], [place] - [address]  Phone:  [phone] Fax:  [phone]     Agent: Please be advised that RX refills may take up to 3 business days. We ask that you follow-up with your pharmacy.

## 2020-04-30 ENCOUNTER — Other Ambulatory Visit: Payer: Self-pay

## 2020-04-30 ENCOUNTER — Other Ambulatory Visit: Payer: Self-pay | Admitting: Pharmacist

## 2020-04-30 ENCOUNTER — Encounter: Payer: Medicare Other | Attending: Internal Medicine | Admitting: Dietician

## 2020-04-30 ENCOUNTER — Encounter: Payer: Self-pay | Admitting: Dietician

## 2020-04-30 DIAGNOSIS — E11621 Type 2 diabetes mellitus with foot ulcer: Secondary | ICD-10-CM | POA: Insufficient documentation

## 2020-04-30 DIAGNOSIS — L97509 Non-pressure chronic ulcer of other part of unspecified foot with unspecified severity: Secondary | ICD-10-CM | POA: Insufficient documentation

## 2020-04-30 MED ORDER — ACCU-CHEK GUIDE ME W/DEVICE KIT: PACK | 0 refills | Status: DC

## 2020-04-30 MED ORDER — SITAGLIPTIN PHOSPHATE 50 MG PO TABS: 50.0000 mg | ORAL_TABLET | Freq: Every day | ORAL | 0 refills | Status: DC

## 2020-04-30 MED ORDER — GLIPIZIDE 10 MG PO TABS: 10.0000 mg | ORAL_TABLET | Freq: Two times a day (BID) | ORAL | 0 refills | Status: DC

## 2020-04-30 MED ORDER — ACCU-CHEK GUIDE VI STRP: ORAL_STRIP | 0 refills | Status: DC

## 2020-04-30 MED ORDER — CLOBETASOL PROPIONATE 0.05 % EX OINT: 1.0000 "application " | TOPICAL_OINTMENT | Freq: Two times a day (BID) | CUTANEOUS | 0 refills | Status: DC | PRN

## 2020-04-30 MED ORDER — ACCU-CHEK SOFTCLIX LANCETS MISC: 0 refills | Status: DC

## 2020-04-30 MED ORDER — METFORMIN HCL 1000 MG PO TABS: 1000.0000 mg | ORAL_TABLET | Freq: Two times a day (BID) | ORAL | 0 refills | Status: DC

## 2020-04-30 NOTE — Progress Notes (Signed)
Medical Nutrition Therapy  Appointment Start time:  1030  Appointment End time:  1150  Primary concerns today: He would like to improve his lifestyle to improve his A1C.  He states that in the past he was able to decreased his A1C to 7.3% when he was exercising most days in the past. He states that he cannot walk due to pain in his calves caused by PVD.  He would like to bike or swim but does not own a bike or have a place to swim. He states that he would like to check his glucose more frequently but he does not get enough strips. He saw a dietitian in [place] a few years ago but cannot remember this. Referral diagnosis: Type 2 diabetes Preferred learning style:  no preference indicated Learning readiness: ready   NUTRITION ASSESSMENT   Anthropometrics  Weight:  237 lbs 04/30/2020  220 lbs UBW 2016 190 lbs lowest adult weight 245 lbs highest adult weight.  He started at the gym and lost weight but stopped going due to the pandemic.  Clinical Medical Hx: Type 2 Diabetes, CAD, PAD, PVD, MI, HTN, periferal neuropathy, gout, OSA -cpap Medications: glipoizide, metformin, januvia, jardiance  Spironolactone.  He is currently out of  Metformin and Januvia as he needs a prescription.  A request was sent into his MD. Labs: 04/26/2020:  Cholesterol 93, Triglycerides 289, HDL 25, LDL 25, A1C 10.1% increased from 7.6% 02/2019 Fasting blood glucose 220-240  Lifestyle & Dietary Hx Patient lives with his wife and son.  She does the shopping and cooking.  His wife has diabetes.  Food is often quite rich.  He does not like artificial sweeteners. He has applied for disability.  He is on medicaid and food stamps and has not food security concerns.  He moved from [place] about 6 years ago due to his son's job.  He used to work for a [employer].  Supplements: none Sleep: not noted Stress / self-care: challenges with consistent medication intake with barriers  Current average weekly physical  activity: none  24-Hr Dietary Recall First Meal: coffee with cream and 1 1/2 T sugar, 3-4 toast with butter or cream cheese or bagel OR cereal (sweetened) and milk Snack: none Second Meal: 2 hotdogs on buns or hotdog with egg OR chicken pot pie Snack: none Third Meal (8:30 pm): white rice, black beans, chicken (prepared with butter and soy sauce- baked) and usually vegetables but none last night Snack: none Beverages: coffee with cream and sugar, water, juice, regular Pepsi  NUTRITION DIAGNOSIS  NB-1.1 Food and nutrition-related knowledge deficit As related to balance of carbohydrates, protein, fat.  As evidenced by diet hx and patient report.   NUTRITION INTERVENTION  Nutrition education (E-1) on the following topics:  . Blood glucose and A1C goals . Importance of consistent medication intake . Implications of saturated fat and excessive intake of fat on his health.  Recommendation to decrease fat intake. Marland Kitchen Discussion of beverage intake containing sugar and implications on his blood sugar.  Patient verbalized that he can decrease sugar in his coffee but not eliminate this.   . Discussed healthier meal options. . Discussed meal timing - having an earlier dinner . Importance of exercise . Portion size . Mindfulness  Handouts Provided Include  How to Thrive:  A Guide for Your Journey with Diabetes by the ADA Meal Plan Card in Spanish Planning Diabetic Meals in Spanish  Learning Style & Readiness for Change Teaching method utilized: Visual &  Auditory  Demonstrated degree of understanding via: Teach Back  Barriers to learning/adherence to lifestyle change: English as a second language  Goals Bake rather than fry most often. Consider fish more frequently than red meat and chicken or eat meatless meals. Limit butter (1 teaspoon is 1 serving), limit cream  Rethink what you drink.  Drink beverages without sugar/carbohydrates. Consider decreasing the sugar and cream in your coffee.    Consider soup and small sandwich and or salad for lunch.  Eat slowly and stop when you are satisfied Consider eating dinner earlier.  Consider getting a pill box to help with your medications. Find your glipizide and Jardiance and pick up the Metformin and Januvia from your pharmacy.  Taking your medication consistently will help your blood sugar.    Recommend exercise for 30 minutes most days.  "Exercise, eat less, choose wisely."  Norberta Keens   MONITORING & EVALUATION Dietary intake, weekly physical activity,  in 1 month.  Next Steps  Patient is to call for questions as needed prior to our next appointment.

## 2020-04-30 NOTE — Telephone Encounter (Signed)
Per Dr. Laural Benes note she had recommend stopping januvia and starting trulicity or lantus but pt declined and didn't want to make any changes to medications. He wants to work on a healthy diet. Franky Macho could you fill pt medications.

## 2020-04-30 NOTE — Patient Instructions (Addendum)
Bake rather than fry most often. Consider fish more frequently than red meat and chicken or eat meatless meals. Limit butter (1 teaspoon is 1 serving), limit cream  Rethink what you drink.  Drink beverages without sugar/carbohydrates. Consider decreasing the sugar and cream in your coffee.   Consider soup and small sandwich and or salad for lunch.  Eat slowly and stop when you are satisfied Consider eating dinner earlier.  Consider getting a pill box to help with your medications. Find your glipizide and Jardiance and pick up the Metformin and Januvia from your pharmacy.  Taking your medication consistently will help your blood sugar.    Recommend exercise for 30 minutes most days.  "Exercise, eat less, choose wisely."  Norberta Keens

## 2020-05-01 ENCOUNTER — Other Ambulatory Visit: Payer: Self-pay | Admitting: Internal Medicine

## 2020-05-01 MED ORDER — METFORMIN HCL 1000 MG PO TABS: 1000.0000 mg | ORAL_TABLET | Freq: Two times a day (BID) | ORAL | 1 refills | Status: DC

## 2020-05-01 MED ORDER — SITAGLIPTIN PHOSPHATE 50 MG PO TABS: 50.0000 mg | ORAL_TABLET | Freq: Every day | ORAL | 1 refills | Status: DC

## 2020-05-02 DIAGNOSIS — Z95810 Presence of automatic (implantable) cardiac defibrillator: Secondary | ICD-10-CM | POA: Diagnosis not present

## 2020-05-03 ENCOUNTER — Telehealth: Payer: Self-pay

## 2020-05-03 LAB — COMPREHENSIVE METABOLIC PANEL
ALT: 26 IU/L (ref 0–44)
AST: 21 IU/L (ref 0–40)
Albumin/Globulin Ratio: 1.4 (ref 1.2–2.2)
Albumin: 4.2 g/dL (ref 3.8–4.8)
Alkaline Phosphatase: 75 IU/L (ref 44–121)
BUN/Creatinine Ratio: 16 (ref 10–24)
BUN: 19 mg/dL (ref 8–27)
Bilirubin Total: 0.5 mg/dL (ref 0.0–1.2)
CO2: 19 mmol/L — ABNORMAL LOW (ref 20–29)
Calcium: 9.6 mg/dL (ref 8.6–10.2)
Chloride: 101 mmol/L (ref 96–106)
Creatinine, Ser: 1.19 mg/dL (ref 0.76–1.27)
GFR calc Af Amer: 75 mL/min/{1.73_m2} (ref >59)
GFR calc non Af Amer: 65 mL/min/{1.73_m2} (ref >59)
Globulin, Total: 3 g/dL (ref 1.5–4.5)
Glucose: 327 mg/dL — ABNORMAL HIGH (ref 65–99)
Potassium: 4.8 mmol/L (ref 3.5–5.2)
Sodium: 138 mmol/L (ref 134–144)
Total Protein: 7.2 g/dL (ref 6.0–8.5)

## 2020-05-03 LAB — CBC
Hematocrit: 44.8 % (ref 37.5–51.0)
Hemoglobin: 14.1 g/dL (ref 13.0–17.7)
MCH: 29.3 pg (ref 26.6–33.0)
MCHC: 31.5 g/dL (ref 31.5–35.7)
MCV: 93 fL (ref 79–97)
Platelets: 198 10*3/uL (ref 150–450)
RBC: 4.81 x10E6/uL (ref 4.14–5.80)
RDW: 12.7 % (ref 11.6–15.4)
WBC: 7.4 10*3/uL (ref 3.4–10.8)

## 2020-05-03 LAB — LIPID PANEL
Chol/HDL Ratio: 3.7 ratio (ref 0.0–5.0)
Cholesterol, Total: 93 mg/dL — ABNORMAL LOW (ref 100–199)
HDL: 25 mg/dL — ABNORMAL LOW (ref >39)
LDL Chol Calc (NIH): 25 mg/dL (ref 0–99)
Triglycerides: 289 mg/dL — ABNORMAL HIGH (ref 0–149)
VLDL Cholesterol Cal: 43 mg/dL — ABNORMAL HIGH (ref 5–40)

## 2020-05-03 LAB — MICROALBUMIN / CREATININE URINE RATIO
Creatinine, Urine: 38.8 mg/dL
Microalb/Creat Ratio: 100 mg/g creat — ABNORMAL HIGH (ref 0–29)
Microalbumin, Urine: 38.9 ug/mL

## 2020-05-03 LAB — PSA: Prostate Specific Ag, Serum: 0.5 ng/mL (ref 0.0–4.0)

## 2020-05-03 NOTE — Telephone Encounter (Signed)
Contacted pt to go over lab results spoke to pt wife and provided results pt wife doesn't have any questions or concerns

## 2020-05-04 ENCOUNTER — Encounter: Payer: Self-pay | Admitting: Internal Medicine

## 2020-05-04 DIAGNOSIS — E1129 Type 2 diabetes mellitus with other diabetic kidney complication: Secondary | ICD-10-CM | POA: Insufficient documentation

## 2020-05-04 DIAGNOSIS — R809 Proteinuria, unspecified: Secondary | ICD-10-CM | POA: Insufficient documentation

## 2020-05-04 NOTE — Progress Notes (Signed)
Correction: Please let patient know that he does have small amount of protein buildup in the urine.  This is an early indication of diabetes affecting the kidneys.  The lisinopril will help to protect the kidneys.  Make sure that he takes the lisinopril every day.

## 2020-05-11 DIAGNOSIS — Z1211 Encounter for screening for malignant neoplasm of colon: Secondary | ICD-10-CM | POA: Diagnosis not present

## 2020-05-17 ENCOUNTER — Encounter: Payer: Self-pay | Admitting: Orthopedic Surgery

## 2020-05-17 ENCOUNTER — Ambulatory Visit (INDEPENDENT_AMBULATORY_CARE_PROVIDER_SITE_OTHER): Payer: Medicaid Other

## 2020-05-17 ENCOUNTER — Ambulatory Visit (INDEPENDENT_AMBULATORY_CARE_PROVIDER_SITE_OTHER): Payer: Medicaid Other | Admitting: Orthopedic Surgery

## 2020-05-17 DIAGNOSIS — M25511 Pain in right shoulder: Secondary | ICD-10-CM | POA: Diagnosis not present

## 2020-05-17 DIAGNOSIS — G8929 Other chronic pain: Secondary | ICD-10-CM

## 2020-05-17 DIAGNOSIS — M5416 Radiculopathy, lumbar region: Secondary | ICD-10-CM

## 2020-05-17 NOTE — Progress Notes (Signed)
Office Visit Note   [name]           Date of Birth: 05/26/1955           MRN: [id number] Visit Date: 05/17/2020              Requested by: Marcine Matar, MD [address] [place],  [place] [address] PCP: Marcine Matar, MD  Chief Complaint  Patient presents with  . Right Shoulder - Pain    Hx of shoulder scope 05/2019      HPI: Patient is a 65 year old gentleman who presents with worsening pain of the right shoulder he is about a year out from arthroscopic debridement for a complete rotator cuff tear.  Patient states that he has pain at night decreased range of motion he states he cannot use his right hand to eat with.  He states that injections in the past have provided temporary relief.  Patient also reports claudication in the left calf with ambulation of about 5 minutes.  Patient has undergone a CT arthrogram which showed no vascular occlusion.  Patient has had epidural steroid injections with Dr. Alvester Morin about 4 months ago which did relieve the majority of his sciatic symptoms.  Patient states he still has calf pain after short ambulation.  Patient reports a history of tobacco use.  Assessment & Plan: Visit Diagnoses:  1. Chronic right shoulder pain   2. Lumbar back pain with radiculopathy affecting left lower extremity     Plan: Patient's symptoms seem to be more consistent with neurogenic claudication he will follow with Dr. Alvester Morin for the left calf claudication symptoms.  Will make referral to Dr. August Saucer for evaluation for reverse total shoulder arthroplasty.  Follow-Up Instructions: Return if symptoms worsen or fail to improve, for Follow-up Dr. Alvester Morin and Dr. August Saucer.Gaylord Shih Exam  Patient is alert, oriented, no adenopathy, well-dressed, normal affect, normal respiratory effort. Examination patient has abduction and flexion to about 90 degrees of the right shoulder there is crepitation with range of motion he has good internal and external rotation  passively range of motion to 120 degrees no adhesive capsulitis.  Examination of the left lower extremity patient has a good dorsalis pedis pulse which is equal bilaterally he does not have any focal motor weakness.  Negative straight leg raise on the left.  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  No images are attached to the encounter.  Labs: Lab Results  Component Value Date   HGBA1C 10.1 (A) 04/26/2020   HGBA1C 7.6 (H) 03/11/2019   ESRSEDRATE 11 03/11/2019   CRP 1.8 (H) 03/11/2019   REPTSTATUS 07/24/2016 FINAL 07/19/2016   GRAMSTAIN  07/19/2016    RARE WBC PRESENT, PREDOMINANTLY MONONUCLEAR NO ORGANISMS SEEN    CULT NO GROWTH 5 DAYS 07/19/2016     Lab Results  Component Value Date   ALBUMIN 4.2 04/26/2020   ALBUMIN 3.7 03/11/2019    No results found for: MG No results found for: VD25OH  No results found for: PREALBUMIN CBC EXTENDED Latest Ref Rng & Units 04/26/2020 05/16/2019 03/13/2019  WBC 3.4 - 10.8 x10E3/uL 7.4 - 9.5  RBC 4.14 - 5.80 x10E6/uL 4.81 - 4.43  HGB 13.0 - 17.7 g/dL 32.2 02.5 42.7  HCT 06.2 - 51.0 % 44.8 43.0 42.4  PLT 150 - 450 x10E3/uL 198 -  197  NEUTROABS 1.7 - 7.7 K/uL - - -  LYMPHSABS 0.7 - 4.0 K/uL - - -     There is no height or weight on file to calculate BMI.  Orders:  Orders Placed This Encounter  Procedures  . XR Shoulder Right   No orders of the defined types were placed in this encounter.    Procedures: No procedures performed  Clinical Data: No additional findings.  ROS:  All other systems negative, except as noted in the HPI. Review of Systems  Objective: Vital Signs: There were no vitals taken for this visit.  Specialty Comments:  No specialty comments available.  PMFS History: Patient Active Problem List   Diagnosis Date Noted  . Microalbuminuria due  to type 2 diabetes mellitus ([name]) 05/04/2020  . Hypertensive heart disease with heart failure ([name]) 04/26/2020  . Type 2 diabetes mellitus with diabetic peripheral angiopathy without gangrene, without long-term current use of insulin ([name]) 04/26/2020  . OSA on CPAP 04/26/2020  . PAD (peripheral artery disease) ([name]) 04/26/2020  . CAD in native artery 04/26/2020  . Class 2 severe obesity due to excess calories with serious comorbidity and body mass index (BMI) of 38.0 to 38.9 in adult ([name]) 04/26/2020  . Impingement syndrome of right shoulder   . Nontraumatic complete tear of right rotator cuff   . Type 2 diabetes mellitus with foot ulcer, without long-term current use of insulin ([name])   . 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 S/P debridement 07/19/2016  . Acute gout of left elbow 07/19/2016   Past Medical History:  Diagnosis Date  . 5 years ago   . AICD (automatic cardioverter/defibrillator) present   . Anxiety   . Arthritis   . CHF (congestive heart failure) ([name])   . Coronary artery disease   . Diabetes mellitus without complication ([name])   . Dysrhythmia   . GERD (gastroesophageal reflux disease)    PMH  . Hypertension   . Peripheral vascular disease ([name])   . Peripheral vascular disease ([name])   . Pneumonia   . Shoulder impingement, right   . Sleep apnea    wears cpap  . Stomach ulcer   . Wears glasses     Family History  Problem Relation Age of Onset  . Heart disease Mother   . Diabetes Mother   . Peripheral vascular disease Mother     Past Surgical History:  Procedure Laterality Date  . AMPUTATION Left 03/12/2019   Procedure: 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;  Surgeon: [name], MD;  Location: [facility] OR;  Service: Orthopedics;  Laterality: Left;  . CARDIAC CATHETERIZATION    . CARDIAC DEFIBRILLATOR PLACEMENT    . I & D EXTREMITY Left 07/19/2016   Procedure: IRRIGATION AND DEBRIDEMENT EXTREMITY/OLECRANON(WASHOUT);  Surgeon: Tarry Kos, MD;   Location: [facility] OR;  Service: Orthopedics;  Laterality: Left;  . OLECRANON BURSECTOMY Left 07/19/2016   Procedure: LEFT ELBOW OLECRANON BURSECTOMY;  Surgeon: Tarry Kos, MD;  Location: [facility];  Service: Orthopedics;  Laterality: Left;  . SHOULDER ARTHROSCOPY Right 05/16/2019   Procedure: Right Shoulder Arthroscopy;  Surgeon: [name], MD;  Location: [facility] OR;  Service: Orthopedics;  Laterality: Right;   Social History   Occupational History  . Not on file  Tobacco Use  . Smoking status: Former Smoker    Packs/day: 1.00    Years: 40.00    Pack years: 40.00    Types: Cigarettes    Quit  date: 03/11/2019    Years since quitting: 1.1  . Smokeless tobacco: Never Used  Vaping Use  . Vaping Use: Never used  Substance and Sexual Activity  . Alcohol use: Not Currently  . Drug use: Yes    Types: Oxycodone  . Sexual activity: Not on file

## 2020-05-19 LAB — COLOGUARD
COLOGUARD: NEGATIVE
Cologuard: NEGATIVE

## 2020-05-20 ENCOUNTER — Other Ambulatory Visit: Payer: Self-pay

## 2020-05-20 ENCOUNTER — Telehealth: Payer: Self-pay | Admitting: Internal Medicine

## 2020-05-21 ENCOUNTER — Encounter: Payer: Self-pay | Admitting: Cardiology

## 2020-05-21 ENCOUNTER — Ambulatory Visit (INDEPENDENT_AMBULATORY_CARE_PROVIDER_SITE_OTHER): Payer: Medicaid Other

## 2020-05-21 ENCOUNTER — Ambulatory Visit (INDEPENDENT_AMBULATORY_CARE_PROVIDER_SITE_OTHER): Payer: Medicaid Other | Admitting: Cardiology

## 2020-05-21 ENCOUNTER — Encounter: Payer: Self-pay | Admitting: Radiology

## 2020-05-21 ENCOUNTER — Other Ambulatory Visit: Payer: Self-pay

## 2020-05-21 VITALS — BP 120/52 | HR 93 | Ht 66.0 in | Wt 234.2 lb

## 2020-05-21 DIAGNOSIS — E785 Hyperlipidemia, unspecified: Secondary | ICD-10-CM

## 2020-05-21 DIAGNOSIS — I493 Ventricular premature depolarization: Secondary | ICD-10-CM

## 2020-05-21 DIAGNOSIS — E1151 Type 2 diabetes mellitus with diabetic peripheral angiopathy without gangrene: Secondary | ICD-10-CM | POA: Diagnosis not present

## 2020-05-21 DIAGNOSIS — I739 Peripheral vascular disease, unspecified: Secondary | ICD-10-CM

## 2020-05-21 DIAGNOSIS — R32 Unspecified urinary incontinence: Secondary | ICD-10-CM | POA: Diagnosis not present

## 2020-05-21 DIAGNOSIS — G4733 Obstructive sleep apnea (adult) (pediatric): Secondary | ICD-10-CM | POA: Diagnosis not present

## 2020-05-21 DIAGNOSIS — Z720 Tobacco use: Secondary | ICD-10-CM

## 2020-05-21 DIAGNOSIS — I2510 Atherosclerotic heart disease of native coronary artery without angina pectoris: Secondary | ICD-10-CM | POA: Diagnosis not present

## 2020-05-21 DIAGNOSIS — I255 Ischemic cardiomyopathy: Secondary | ICD-10-CM | POA: Diagnosis not present

## 2020-05-21 DIAGNOSIS — I10 Essential (primary) hypertension: Secondary | ICD-10-CM

## 2020-05-21 NOTE — Progress Notes (Addendum)
Cardiology Office Note:    Date:  05/23/2020   ID:  Jon Jones, DOB 09-Dec-1955, MRN [id number]  PCP:  Ladell Pier, MD  Cardiologist:  No primary care provider on file.  Electrophysiologist:  None   Referring MD: Ladell Pier, MD   Chief Complaint  Patient presents with  . Coronary Artery Disease    History of Present Illness:    Jon Jones is a 65 y.o. male with a hx of CAD status post IMI in 2016, ischemic cardiomyopathy (EF 35%) status post ICD, tobacco use, OSA, PAD, T2DM, hypertension, hyperlipidemia who is referred by Dr. Wynetta Emery for evaluation of CAD.  He had an inferior MI in [place] in 2015.  No intervention.  Echocardiogram 02/2015 showed LVEF 35%.  He has been following at [facility].  Echo on 04/2016 showed LVEF 35%, no change from prior echo.  Underwent ICD implantation 09/2016.  Moved to [place] 4-5 years ago.  He previously had followed at [facility] but wishes to transfer his care here.  He denies any chest pain.  Reports dyspnea with going up and down stairs.  Reports occasional lightheadedness, denies any syncope.  Denies any lower extremity edema.  Reports rare palpitations.  Denies any ICD shocks.  He has been taking DAPT, denies any bleeding issues.   Past Medical History:  Diagnosis Date  . 5 years ago   . AICD (automatic cardioverter/defibrillator) present   . Anxiety   . Arthritis   . CHF (congestive heart failure) ([place])   . Coronary artery disease   . Diabetes mellitus without complication ([place])   . Dysrhythmia   . GERD (gastroesophageal reflux disease)    PMH  . Hypertension   . Peripheral vascular disease ([place])   . Peripheral vascular disease ([place])   . Pneumonia   . Shoulder impingement, right   . Sleep apnea    wears cpap  . Stomach ulcer   . Wears glasses     Past Surgical History:  Procedure Laterality Date  . AMPUTATION Left 03/12/2019   Procedure: 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;  Surgeon: [name], MD;  Location: [place];  Service:  Orthopedics;  Laterality: Left;  . CARDIAC CATHETERIZATION    . CARDIAC DEFIBRILLATOR PLACEMENT    . I & D EXTREMITY Left 07/19/2016   Procedure: IRRIGATION AND DEBRIDEMENT EXTREMITY/OLECRANON(WASHOUT);  Surgeon: Leandrew Koyanagi, MD;  Location: [place];  Service: Orthopedics;  Laterality: Left;  . OLECRANON BURSECTOMY Left 07/19/2016   Procedure: LEFT ELBOW OLECRANON BURSECTOMY;  Surgeon: Leandrew Koyanagi, MD;  Location: [place];  Service: Orthopedics;  Laterality: Left;  . SHOULDER ARTHROSCOPY Right 05/16/2019   Procedure: Right Shoulder Arthroscopy;  Surgeon: [name], MD;  Location: [place];  Service: Orthopedics;  Laterality: Right;    Current Medications: Current Meds  Medication Sig  . Accu-Chek Softclix Lancets lancets Check blood sugar once daily.  Marland Kitchen aspirin 81 MG EC tablet Take 81 mg by mouth daily.  . Blood Glucose Monitoring Suppl (ACCU-CHEK GUIDE ME) w/Device KIT Check blood sugar once daily.  Marland Kitchen buPROPion (WELLBUTRIN SR) 150 MG 12 hr tablet Take 150 mg by mouth 2 (two) times daily.   . clobetasol ointment (TEMOVATE) 2.64 % Apply 1 application topically 2 (two) times daily as needed.  . clopidogrel (PLAVIX) 75 MG tablet Take 75 mg by mouth daily.  . empagliflozin (JARDIANCE) 25 MG TABS tablet Take 25 mg by mouth at bedtime.   Marland Kitchen ezetimibe (ZETIA) 10 MG tablet Take  10 mg by mouth at bedtime.   . gabapentin (NEURONTIN) 300 MG capsule Take 1 capsule (300 mg total) by mouth at bedtime.  Marland Kitchen glipiZIDE (GLUCOTROL) 10 MG tablet Take 1 tablet (10 mg total) by mouth 2 (two) times daily.  Marland Kitchen glucose blood (ACCU-CHEK GUIDE) test strip Check blood sugar once daily.  . metFORMIN (GLUCOPHAGE) 1000 MG tablet Take 1 tablet (1,000 mg total) by mouth 2 (two) times daily with a meal.  . nitroGLYCERIN (NITROSTAT) 0.4 MG SL tablet Place 0.4 mg under the tongue See admin instructions. Every 5 minutes as needed for chest pain. Max of 3 doses  . sitaGLIPtin (JANUVIA) 50 MG tablet Take 1 tablet (50  mg total) by mouth daily.  Marland Kitchen spironolactone (ALDACTONE) 25 MG tablet Take 12.5 mg by mouth daily.     Allergies:   Patient has no known allergies.   Social History   Socioeconomic History  . Marital status: Married    Spouse name: Not on file  . Number of children: Not on file  . Years of education: Not on file  . Highest education level: Not on file  Occupational History  . Not on file  Tobacco Use  . Smoking status: Former Smoker    Packs/day: 1.00    Years: 40.00    Pack years: 40.00    Types: Cigarettes    Quit date: 03/11/2019    Years since quitting: 1.2  . Smokeless tobacco: Never Used  Vaping Use  . Vaping Use: Never used  Substance and Sexual Activity  . Alcohol use: Not Currently  . Drug use: Yes    Types: Oxycodone  . Sexual activity: Not on file  Other Topics Concern  . Not on file  Social History Narrative  . Not on file   Social Determinants of Health   Financial Resource Strain: Not on file  Food Insecurity: Not on file  Transportation Needs: Not on file  Physical Activity: Not on file  Stress: Not on file  Social Connections: Not on file     Family History: The patient's family history includes Diabetes in his mother; Heart disease in his mother; Peripheral vascular disease in his mother.  ROS:   Please see the history of present illness.     All other systems reviewed and are negative.  EKGs/Labs/Other Studies Reviewed:    The following studies were reviewed today:   EKG:  EKG is  ordered today.  The ekg ordered today demonstrates sinus rhythm, rate 93, frequent PVCs Q waves in inferior leads  Recent Labs: 04/26/2020: ALT 26; BUN 19; Creatinine, Ser 1.19; Hemoglobin 14.1; Platelets 198; Potassium 4.8; Sodium 138  Recent Lipid Panel    Component Value Date/Time   CHOL 93 (L) 04/26/2020 1002   TRIG 289 (H) 04/26/2020 1002   HDL 25 (L) 04/26/2020 1002   CHOLHDL 3.7 04/26/2020 1002   LDLCALC 25 04/26/2020 1002    Physical Exam:     VS:  BP (!) 120/52   Pulse 93   Ht 5' 6"  (1.676 m)   Wt 234 lb 3.2 oz (106.2 kg)   SpO2 96%   BMI 37.80 kg/m     Wt Readings from Last 3 Encounters:  05/21/20 234 lb 3.2 oz (106.2 kg)  04/30/20 237 lb (107.5 kg)  04/26/20 238 lb 12.8 oz (108.3 kg)     GEN: Well nourished, well developed in no acute distress HEENT: Normal NECK: No JVD; No carotid bruits LYMPHATICS: No lymphadenopathy CARDIAC: RRR, no  murmurs, rubs, gallops RESPIRATORY:  Clear to auscultation without rales, wheezing or rhonchi  ABDOMEN: Soft, non-tender, non-distended MUSCULOSKELETAL:  No edema; No deformity  SKIN: Warm and dry NEUROLOGIC:  Alert and oriented x 3 PSYCHIATRIC:  Normal affect   ASSESSMENT:    1. CAD in native artery   2. Ischemic cardiomyopathy   3. PVCs (premature ventricular contractions)   4. Type 2 diabetes mellitus with diabetic peripheral angiopathy without gangrene, without long-term current use of insulin ([place])   5. PAD (peripheral artery disease) ([place])   6. Essential hypertension   7. Hyperlipidemia, unspecified hyperlipidemia type   8. Tobacco use    PLAN:     CAD: Status post reported inferior MI in 2014 in [place], a had nuclear stress test in 2015 showing inferior infarct, no ischemia, EF 36%.  Cath was not done. -Continue aspirin 81 mg daily Plavix 75 mg daily -Continue atorvastatin 80 mg daily -Continue Jardiance 25 mg daily -Continue Toprol-XL 100 mg daily -Sublingual nitroglycerin as needed -Smoking cessation strongly encouraged  Ischemic cardiomyopathy: EF 35% on echo in 2018.  Status post ICD. -Echocardiogram -On lisinopril 10 mg daily.  Will follow up results of echocardiogram, if EF remains reduced would favor switching to Entresto -Continue Toprol-XL 100 mg daily -Continue Aldactone 12.5 mg daily -Continue Jardiance 25 mg daily -He is transfering [facility] care from [facility], will arrange follow-up in device clinic with Dr Sallyanne Kuster  Frequent PVCs: Noted on EKG.   Will quantify PVC burden with Zio patch x3 days  PAD: Continue aspirin, Plavix, statin  Hypertension: Continue lisinopril, Toprol-XL, Aldactone.  Appears well controlled.  Hyperlipidemia: On atorvastatin 80 mg daily and Zetia 10 mg daily.  LDL 25 on 04/26/2020, can discontinue Zetia  T2DM: A1c 10.1 on 04/26/2020.  Will refer to endocrinology for management  Tobacco use: Patient counseled on the risk of tobacco use and cessation strongly encouraged  RTC in 3 months  Medication Adjustments/Labs and Tests Ordered: Current medicines are reviewed at length with the patient today.  Concerns regarding medicines are outlined above.  Orders Placed This Encounter  Procedures  . Ambulatory referral to Endocrinology  . LONG TERM MONITOR (3-14 DAYS)  . EKG 12-Lead  . ECHOCARDIOGRAM COMPLETE   No orders of the defined types were placed in this encounter.   Patient Instructions  Medication Instructions:  STOP Zetia  *If you need a refill on your cardiac medications before your next appointment, please call your pharmacy*  Testing/Procedures: Your physician has requested that you have an echocardiogram. Echocardiography is a painless test that uses sound waves to create images of your heart. It provides your doctor with information about the size and shape of your heart and how well your heart's chambers and valves are working. This procedure takes approximately one hour. There are no restrictions for this procedure.  This will be done at our [facility] location:  [place] has requested you wear your ZIO patch monitor 3 days.   This is a single patch monitor.  [employer] supplies one patch monitor per enrollment.  Additional stickers are not available.   Please do not apply patch if you will be having a Nuclear Stress Test, Echocardiogram, Cardiac CT, MRI, or Chest Xray during the time frame you would be wearing  the monitor. The patch cannot be worn during these tests.  You cannot remove and re-apply the ZIO XT patch monitor.  Your ZIO patch monitor will be sent USPS Priority mail from [facility] directly to your home address. The monitor may also be mailed to a PO BOX if home delivery is not available.   It may take 3-5 days to receive your monitor after you have been enrolled.   Once you have received you monitor, please review enclosed instructions.  Your monitor has already been registered assigning a specific monitor serial # to you.   Applying the monitor   Shave hair from upper left chest.   Hold abrader disc by orange tab.  Rub abrader in 40 strokes over left upper chest as indicated in your monitor instructions.   Clean area with 4 enclosed alcohol pads .  Use all pads to assure are is cleaned thoroughly.  Let dry.   Apply patch as indicated in monitor instructions.  Patch will be place under collarbone on left side of chest with arrow pointing upward.   Rub patch adhesive wings for 2 minutes.Remove white label marked "1".  Remove white label marked "2".  Rub patch adhesive wings for 2 additional minutes.   While looking in a mirror, press and release button in center of patch.  A small green light will flash 3-4 times .  This will be your only indicator the monitor has been turned on.     Do not shower for the first 24 hours.  You may shower after the first 24 hours.   Press button if you feel a symptom. You will hear a small click.  Record Date, Time and Symptom in the Patient Log Book.   When you are ready to remove patch, follow instructions on last 2 pages of Patient Log Book.  Stick patch monitor onto last page of Patient Log Book.   Place Patient Log Book in [place] box.  Use locking tab on box and tape box closed securely.  The [employer] has [employer] on it.  Please place in mailbox as soon as possible.  Your physician should have your test results  approximately 7 days after the monitor has been mailed back to [facility].   Call [place] at [phone] if you have questions regarding your ZIO XT patch monitor.  Call them immediately if you see an orange light blinking on your monitor.   If your monitor falls off in less than 4 days contact our Monitor department at [phone].  If your monitor becomes loose or falls off after 4 days call [employer] at [phone] for suggestions on securing your monitor.    Follow-Up: At [facility], you and your health needs are our priority.  As part of our continuing mission to provide you with exceptional heart care, we have created designated Provider Care Teams.  These Care Teams include your primary Cardiologist (physician) and Advanced Practice Providers (APPs -  Physician Assistants and Nurse Practitioners) who all work together to provide you with the care you need, when you need it.  We recommend signing up for the patient portal called "MyChart".  Sign up information is provided on this After Visit Summary.  MyChart is used to connect with patients for Virtual Visits (Telemedicine).  Patients are able to view lab/test results, encounter notes, upcoming appointments, etc.  Non-urgent messages can be sent to your provider as well.   To learn more about what you can do with MyChart, go to [email or web address].    Your next appointment:   3 month(s)  The format for your  next appointment:   In Person  Provider:   Oswaldo Milian, MD   Other Instructions You have been referred to: Endocrinology for diabetes management You have been referred to: Dr. Sallyanne Kuster to manage ICD       Signed, Donato Heinz, MD  05/23/2020 7:26 PM    [place]

## 2020-05-21 NOTE — Telephone Encounter (Signed)
Contacted pt to go over results pt is aware and doesn't have any questions or concerns  

## 2020-05-21 NOTE — Progress Notes (Signed)
Enrolled patient for a 3 day Zio XT Monitor to be mailed to patients home.  °

## 2020-05-21 NOTE — Patient Instructions (Signed)
Medication Instructions:  STOP Zetia  *If you need a refill on your cardiac medications before your next appointment, please call your pharmacy*  Testing/Procedures: Your physician has requested that you have an echocardiogram. Echocardiography is a painless test that uses sound waves to create images of your heart. It provides your doctor with information about the size and shape of your heart and how well your heart's chambers and valves are working. This procedure takes approximately one hour. There are no restrictions for this procedure.  This will be done at our [facility] location:  [address]   ZIO XT- Long Term Monitor Instructions   Your physician has requested you wear your ZIO patch monitor 3 days.   This is a single patch monitor.  [employer] supplies one patch monitor per enrollment.  Additional stickers are not available.   Please do not apply patch if you will be having a Nuclear Stress Test, Echocardiogram, Cardiac CT, MRI, or Chest Xray during the time frame you would be wearing the monitor. The patch cannot be worn during these tests.  You cannot remove and re-apply the ZIO XT patch monitor.   Your ZIO patch monitor will be sent USPS Priority mail from [facility] directly to your home address. The monitor may also be mailed to a PO BOX if home delivery is not available.   It may take 3-5 days to receive your monitor after you have been enrolled.   Once you have received you monitor, please review enclosed instructions.  Your monitor has already been registered assigning a specific monitor serial # to you.   Applying the monitor   Shave hair from upper left chest.   Hold abrader disc by orange tab.  Rub abrader in 40 strokes over left upper chest as indicated in your monitor instructions.   Clean area with 4 enclosed alcohol pads .  Use all pads to assure are is cleaned thoroughly.  Let dry.   Apply patch as indicated in monitor  instructions.  Patch will be place under collarbone on left side of chest with arrow pointing upward.   Rub patch adhesive wings for 2 minutes.Remove white label marked "1".  Remove white label marked "2".  Rub patch adhesive wings for 2 additional minutes.   While looking in a mirror, press and release button in center of patch.  A small green light will flash 3-4 times .  This will be your only indicator the monitor has been turned on.     Do not shower for the first 24 hours.  You may shower after the first 24 hours.   Press button if you feel a symptom. You will hear a small click.  Record Date, Time and Symptom in the Patient Log Book.   When you are ready to remove patch, follow instructions on last 2 pages of Patient Log Book.  Stick patch monitor onto last page of Patient Log Book.   Place Patient Log Book in [place] box.  Use locking tab on box and tape box closed securely.  The [employer] has [employer] on it.  Please place in mailbox as soon as possible.  Your physician should have your test results approximately 7 days after the monitor has been mailed back to [facility].   Call [facility] Customer Care at [phone] if you have questions regarding your ZIO XT patch monitor.  Call them immediately if you see an orange light blinking on your monitor.  If your monitor falls off in less than 4 days contact our Monitor department at [phone].  If your monitor becomes loose or falls off after 4 days call [employer] at [phone] for suggestions on securing your monitor.    Follow-Up: At [facility], you and your health needs are our priority.  As part of our continuing mission to provide you with exceptional heart care, we have created designated Provider Care Teams.  These Care Teams include your primary Cardiologist (physician) and Advanced Practice Providers (APPs -  Physician Assistants and Nurse Practitioners) who all work together to provide you with  the care you need, when you need it.  We recommend signing up for the patient portal called "MyChart".  Sign up information is provided on this After Visit Summary.  MyChart is used to connect with patients for Virtual Visits (Telemedicine).  Patients are able to view lab/test results, encounter notes, upcoming appointments, etc.  Non-urgent messages can be sent to your provider as well.   To learn more about what you can do with MyChart, go to [email or web address].    Your next appointment:   3 month(s)  The format for your next appointment:   In Person  Provider:   [name], MD   Other Instructions You have been referred to: Endocrinology for diabetes management You have been referred to: Dr. Royann Shivers to manage ICD

## 2020-05-26 ENCOUNTER — Telehealth: Payer: Self-pay | Admitting: Physician Assistant

## 2020-05-26 ENCOUNTER — Ambulatory Visit (INDEPENDENT_AMBULATORY_CARE_PROVIDER_SITE_OTHER): Payer: Medicaid Other | Admitting: Orthopedic Surgery

## 2020-05-26 ENCOUNTER — Other Ambulatory Visit: Payer: Self-pay

## 2020-05-26 DIAGNOSIS — G8929 Other chronic pain: Secondary | ICD-10-CM | POA: Diagnosis not present

## 2020-05-26 DIAGNOSIS — M25511 Pain in right shoulder: Secondary | ICD-10-CM | POA: Diagnosis not present

## 2020-05-26 NOTE — Telephone Encounter (Signed)
Patient is requesting a phone call from PA Persons as soon as possible concerning his referral with Dr. Alvester Morin. Patient states he does not want to see Dr. Alvester Morin. Please call so he may explain in further detail. Please call patient at [phone].

## 2020-05-26 NOTE — Telephone Encounter (Signed)
Patient can be referred to whoever he would like. Can do external referrel

## 2020-05-26 NOTE — Telephone Encounter (Signed)
LMOM for patient that we could refer him to [facility]

## 2020-05-27 ENCOUNTER — Encounter: Payer: Self-pay | Admitting: Orthopedic Surgery

## 2020-05-27 ENCOUNTER — Telehealth: Payer: Self-pay | Admitting: Physical Medicine and Rehabilitation

## 2020-05-27 DIAGNOSIS — I493 Ventricular premature depolarization: Secondary | ICD-10-CM | POA: Diagnosis not present

## 2020-05-27 NOTE — Telephone Encounter (Signed)
Called pt and sch OV lower back 4/6. Pt mention a different problem and Blood clots. Pt did see Dr. Lajoyce Corners.

## 2020-05-27 NOTE — Progress Notes (Signed)
Office Visit Note   [name]           Date of Birth: 1955/05/05           MRN: [id number] Visit Date: 05/26/2020 Requested by: Marcine Matar, MD [address] [place],  [place] [address] PCP: Marcine Matar, MD  Subjective: Chief Complaint  Patient presents with  . Right Shoulder - Pain    HPI: Jon Jones is a 65 year old patient with right shoulder pain.  He has had pain since a fall over a year ago.  He has been getting injections which initially gave him good relief but not at the current time.  Pain wakes him from sleep at night.  He describes weakness as well as painful range of motion.  He is right-hand dominant.  He has had prior arthroscopy.  Tried oxycodone prednisone and Aleve without relief.  Last injection was 2 weeks ago.  He is retired.  Hemoglobin A1c is 10.  He does have low EF and a defibrillator in place.              ROS: All systems reviewed are negative as they relate to the chief complaint within the history of present illness.  Patient denies  fevers or chills.   Assessment & Plan: Visit Diagnoses:  1. Chronic right shoulder pain     Plan: Impression is right shoulder rotator cuff arthropathy with superior migration of the humeral head and mild to moderate arthritis present.  Plan is thin cut CT scan for preop planning with 41-month return to recheck hemoglobin A1c.  If that is in the 8 range we could proceed with cardiac risk stratification and possible surgery scheduling at that time.  He does have many medical comorbidities.  He will need to get his hemoglobin A1c in a better spot prior to surgery.  Follow-Up Instructions: Return for after MRI.   Orders:  Orders Placed This Encounter  Procedures  . CT SHOULDER RIGHT WO CONTRAST   No orders of the defined types were placed in this encounter.     Procedures: No procedures performed   Clinical Data: No additional findings.  Objective: Vital Signs: There were no vitals taken for  this visit.  Physical Exam:   Constitutional: Patient appears well-developed HEENT:  Head: Normocephalic Eyes:EOM are normal Neck: Normal range of motion Cardiovascular: Normal rate Pulmonary/chest: Effort normal Neurologic: Patient is alert Skin: Skin is warm Psychiatric: Patient has normal mood and affect    Ortho Exam: Ortho exam demonstrates intact deltoid function.  Has active range of motion right shoulder 30 80 120 and passive range of motion in the right shoulder 30/110/170.  Rotator cuff strength is diminished supraspinatus and infraspinatus on the right.  Motor sensory function to the hand is intact.  He does have some crepitus with passive range of motion. Specialty Comments:  No specialty comments available.  Imaging: No results found.   PMFS History: Patient Active Problem List   Diagnosis Date Noted  . 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 diabetes mellitus ([name]) 05/04/2020  . Hypertensive heart disease with heart failure ([name]) 04/26/2020  . Type 2 diabetes mellitus with diabetic peripheral angiopathy without gangrene, without long-term current use of insulin ([name]) 04/26/2020  . OSA on CPAP 04/26/2020  . PAD (peripheral artery disease) ([name]) 04/26/2020  . CAD in native artery 04/26/2020  . Class 2 severe obesity due to excess calories with serious comorbidity and body mass index (BMI) of 38.0 to 38.9 in adult (  [name]) 04/26/2020  . Impingement syndrome of right shoulder   . Nontraumatic complete tear of right rotator cuff   . Type 2 diabetes mellitus with foot ulcer, without long-term current use of insulin ([name])   . 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 S/P debridement 07/19/2016  . Acute gout of left elbow 07/19/2016   Past Medical History:  Diagnosis Date  . 5 years ago   . AICD (automatic cardioverter/defibrillator) present   . Anxiety   . Arthritis   . CHF (congestive heart failure) ([name])   . Coronary artery disease   . Diabetes  mellitus without complication ([name])   . Dysrhythmia   . GERD (gastroesophageal reflux disease)    PMH  . Hypertension   . Peripheral vascular disease ([name])   . Peripheral vascular disease ([name])   . Pneumonia   . Shoulder impingement, right   . Sleep apnea    wears cpap  . Stomach ulcer   . Wears glasses     Family History  Problem Relation Age of Onset  . Heart disease Mother   . Diabetes Mother   . Peripheral vascular disease Mother     Past Surgical History:  Procedure Laterality Date  . AMPUTATION Left 03/12/2019   Procedure: 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;  Surgeon: [name], MD;  Location: [facility] OR;  Service: Orthopedics;  Laterality: Left;  . CARDIAC CATHETERIZATION    . CARDIAC DEFIBRILLATOR PLACEMENT    . I & D EXTREMITY Left 07/19/2016   Procedure: IRRIGATION AND DEBRIDEMENT EXTREMITY/OLECRANON(WASHOUT);  Surgeon: Tarry Kos, MD;  Location: [facility] OR;  Service: Orthopedics;  Laterality: Left;  . OLECRANON BURSECTOMY Left 07/19/2016   Procedure: LEFT ELBOW OLECRANON BURSECTOMY;  Surgeon: Tarry Kos, MD;  Location: [facility];  Service: Orthopedics;  Laterality: Left;  . SHOULDER ARTHROSCOPY Right 05/16/2019   Procedure: Right Shoulder Arthroscopy;  Surgeon: [name], MD;  Location: [facility] OR;  Service: Orthopedics;  Laterality: Right;   Social History   Occupational History  . Not on file  Tobacco Use  . Smoking status: Former Smoker    Packs/day: 1.00    Years: 40.00    Pack years: 40.00    Types: Cigarettes    Quit date: 03/11/2019    Years since quitting: 1.2  . Smokeless tobacco: Never Used  Vaping Use  . Vaping Use: Never used  Substance and Sexual Activity  . Alcohol use: Not Currently  . Drug use: Yes    Types: Oxycodone  . Sexual activity: Not on file

## 2020-05-27 NOTE — Telephone Encounter (Signed)
Patient called needing to schedule an appointment with Dr Alvester Morin for his back. Patient was referred back to Dr Alvester Morin. The number to contact patient is [phone]

## 2020-05-28 ENCOUNTER — Other Ambulatory Visit: Payer: Self-pay

## 2020-05-28 ENCOUNTER — Encounter: Payer: Self-pay | Admitting: Dietician

## 2020-05-28 ENCOUNTER — Encounter: Payer: Medicaid Other | Attending: Internal Medicine | Admitting: Dietician

## 2020-05-28 DIAGNOSIS — E1151 Type 2 diabetes mellitus with diabetic peripheral angiopathy without gangrene: Secondary | ICD-10-CM | POA: Insufficient documentation

## 2020-05-28 NOTE — Progress Notes (Signed)
Patient is here today alone.  He was last seen by this RD 2/18 lbs.  He is going to get an elliptical for his home.  He states that he needs shoulder surgery.  He is currently not exercising.  When he walks, he has been getting severe calf pain.  He states that he is going to have his back checked to see if this is the cause.  Medical Nutrition Therapy  Appointment Start time:  1030  Appointment End time:  1150  Primary concerns today: He needs to reduce his A1C to be eligible for shoulder surgery. He continues to verbalize that he has lowered his A1C in the past with lifestyle and wishes to do this again.  He saw a dietitian in [place] a few years ago but cannot remember this. Referral diagnosis: Type 2 diabetes Preferred learning style:  no preference indicated Learning readiness: ready   NUTRITION ASSESSMENT   Anthropometrics  Weight: 234 lbs 06/07/2020 237 lbs 04/30/2020  220 lbs UBW 2016 190 lbs lowest adult weight 245 lbs highest adult weight.  He started at the gym and lost weight but stopped going due to the pandemic.  Clinical Medical Hx: Type 2 Diabetes, CAD, PAD, PVD, MI, HTN, periferal neuropathy, gout, OSA -cpap Medications: glipoizide, metformin, januvia, jardiance  Spironolactone.  He now reports consistent intake of these medications. Labs: 04/26/2020:  Cholesterol 93, Triglycerides 289, HDL 25, LDL 25, A1C 10.1% increased from 7.6% 02/2019  He requested a new blood glucose meter.   AccuChek Guide me was provided, set up, and patient was able to deomonstrat it's use.   Lot: [id number] Exp 05/02/2021 Glucose 354 (1.75 hours after coffee with 1T sugar, cream, roll)  Discussed the difference between premeal and post meal glucose which ideally should only rise 40-60 points.   Further discussed options to treat blood glucose.  Patient states that his doctor discussed injectable medications that were not insulin (GLP1).   Provided a brochure on Type 2 Diabetes and GLP1 and  showed the injection pens.  Patient showed more interest in this treatment rather than insulin at this time. He remains resistant to discussion of insulin.  He is to see an endocrinologist in our office 07/2020.  He reports that his fasting glucose is now 206-211 which has decreased from 220-240 in February.  Discussed that although his blood glucose is lower, it remains above range and may not show a significantly lower A1C at this time.  Lifestyle & Dietary Hx Patient lives with his wife and son.  She does the shopping and cooking.  His wife has diabetes.  Food is often quite rich.  He does not like artificial sweeteners. He has applied for disability.  He is on medicaid and food stamps and has not food security concerns.  He moved from [place] about 6 years ago due to his son's job.  He used to work for a [employer].  Supplements: none Sleep: not noted Stress / self-care: challenges with consistent medication intake with barriers  Current average weekly physical activity: none  24-Hr Dietary Recall First Meal: coffee with cream and 1 T sugar, cuban roll with butter or cream cheese or bagel OR cereal (unsweetened) and milk Snack: none Second Meal: soup, fish  Snack: none Third Meal (8:30 pm): white rice, black beans, fish, vegetables at times (dislikes many) Snack: none Beverages: coffee with cream and sugar, water, juice, regular Pepsi, [place] (regular drinks 1-2 times per week)  NUTRITION DIAGNOSIS  NB-1.1 Food and  nutrition-related knowledge deficit As related to balance of carbohydrates, protein, fat.  As evidenced by diet hx and patient report.   NUTRITION INTERVENTION  Nutrition education (E-1) on the following topics:  . Mindfulness . Meal choices . Carbohydrates in sugar and sugar containing beverages . Importance of exercise . Difference of glucose pre to post meal . GLP1 medications  Handouts Provided Include (provided at first visit) How to Thrive:  A  Guide for Your Journey with Diabetes by the ADA Meal Plan Card in Spanish Planning Diabetic Meals in Spanish Type 2 diabetes and GLP1 medication (provided 05/28/2020)  Learning Style & Readiness for Change Teaching method utilized: Visual & Auditory  Demonstrated degree of understanding via: Teach Back  Barriers to learning/adherence to lifestyle change: English as a second language  Goals Consistent exercise.  Aim for 1 hour 3 times per week or 30 minutes 5 days per week.  Start slowly and increase as tolerated.  Continue to be mindful of your portion sizes.  Rethink your drinks.  Aim for beverages with no carbohydrates.  Continue to take your medication as prescribed Continue to check your blood sugar.  "Exercise, eat less, choose wisely."  Norberta Keens   MONITORING & EVALUATION Dietary intake, weekly physical activity,  in 1 month.  Next Steps  Patient is to call for questions as needed prior to our next appointment.

## 2020-05-28 NOTE — Patient Instructions (Addendum)
Consistent exercise.  Aim for 1 hour 3 times per week or 30 minutes 5 days per week.  Start slowly and increase as tolerated.  Continue to be mindful of your portion sizes.  Rethink your drinks.  Aim for beverages with no carbohydrates.  Continue to take your medication as prescribed Continue to check your blood sugar.   "Exercise, eat less, choose wisely."  Norberta Keens

## 2020-06-01 ENCOUNTER — Ambulatory Visit (HOSPITAL_COMMUNITY): Payer: Medicare Other | Attending: Cardiovascular Disease

## 2020-06-01 ENCOUNTER — Other Ambulatory Visit: Payer: Self-pay

## 2020-06-01 DIAGNOSIS — I255 Ischemic cardiomyopathy: Secondary | ICD-10-CM | POA: Insufficient documentation

## 2020-06-01 LAB — ECHOCARDIOGRAM COMPLETE
Area-P 1/2: 2.95 cm2
S' Lateral: 4.6 cm

## 2020-06-02 ENCOUNTER — Telehealth: Payer: Self-pay | Admitting: Internal Medicine

## 2020-06-02 ENCOUNTER — Other Ambulatory Visit: Payer: Self-pay | Admitting: Internal Medicine

## 2020-06-02 NOTE — Telephone Encounter (Signed)
Pt called and is requesting to have a refill on his inhaler. He states that he is completely out of it and is needing to have it. Please advise.      [facility] [phone] - [place], [place] - [phone] AT [facility] [address] [place] [address]  Phone: [phone] Fax: [phone]  Hours: Not open 24 hours

## 2020-06-02 NOTE — Telephone Encounter (Signed)
Pt stated he received a messages from his pharmacy that the Rx refills for the inhaler as well as the lancets were denied or no response from provider. Pt requests refills be approved.

## 2020-06-03 ENCOUNTER — Other Ambulatory Visit: Payer: Self-pay | Admitting: *Deleted

## 2020-06-03 DIAGNOSIS — I739 Peripheral vascular disease, unspecified: Secondary | ICD-10-CM

## 2020-06-03 MED ORDER — ACCU-CHEK SOFTCLIX LANCETS MISC: 2 refills | Status: DC

## 2020-06-03 MED ORDER — ALBUTEROL SULFATE HFA 108 (90 BASE) MCG/ACT IN AERS: 2.0000 | INHALATION_SPRAY | Freq: Four times a day (QID) | RESPIRATORY_TRACT | 2 refills | Status: DC | PRN

## 2020-06-03 NOTE — Telephone Encounter (Signed)
Rx sent 

## 2020-06-05 ENCOUNTER — Other Ambulatory Visit: Payer: Self-pay

## 2020-06-05 ENCOUNTER — Ambulatory Visit
Admission: RE | Admit: 2020-06-05 | Discharge: 2020-06-05 | Disposition: A | Discharge status: Home / Self Care | Payer: Medicaid Other | Source: Ambulatory Visit | Attending: Orthopedic Surgery | Admitting: Orthopedic Surgery

## 2020-06-05 DIAGNOSIS — M25411 Effusion, right shoulder: Secondary | ICD-10-CM | POA: Diagnosis not present

## 2020-06-05 DIAGNOSIS — G8929 Other chronic pain: Secondary | ICD-10-CM

## 2020-06-05 DIAGNOSIS — Z01818 Encounter for other preprocedural examination: Secondary | ICD-10-CM | POA: Diagnosis not present

## 2020-06-05 DIAGNOSIS — M19011 Primary osteoarthritis, right shoulder: Secondary | ICD-10-CM | POA: Diagnosis not present

## 2020-06-08 ENCOUNTER — Encounter: Payer: Self-pay | Admitting: Vascular Surgery

## 2020-06-08 ENCOUNTER — Other Ambulatory Visit: Payer: Self-pay

## 2020-06-08 ENCOUNTER — Ambulatory Visit (HOSPITAL_COMMUNITY)
Admission: RE | Admit: 2020-06-08 | Discharge: 2020-06-08 | Disposition: A | Discharge status: Home / Self Care | Payer: Medicare Other | Source: Ambulatory Visit | Attending: Vascular Surgery | Admitting: Vascular Surgery

## 2020-06-08 ENCOUNTER — Ambulatory Visit (INDEPENDENT_AMBULATORY_CARE_PROVIDER_SITE_OTHER): Payer: Medicaid Other | Admitting: Vascular Surgery

## 2020-06-08 VITALS — BP 128/69 | HR 80 | Temp 98.1°F | Resp 20 | Ht 66.0 in | Wt 236.0 lb

## 2020-06-08 DIAGNOSIS — I70213 Atherosclerosis of native arteries of extremities with intermittent claudication, bilateral legs: Secondary | ICD-10-CM

## 2020-06-08 DIAGNOSIS — I739 Peripheral vascular disease, unspecified: Secondary | ICD-10-CM | POA: Insufficient documentation

## 2020-06-08 NOTE — Progress Notes (Signed)
[facility] OF [place]  ASSESSMENT / PLAN: Jon Jones is a 65 y.o. male with atherosclerosis of native arteries of bilateral lower extremities causing intermittent claudication.  Patient counseled patients with asymptomatic peripheral arterial disease or claudication have a 1-2% risk of developing chronic limb threatening ischemia, but a 15-30% risk of mortality in the next 5 years. Intervention should only be considered for medically optimized patients with disabling symptoms. .  Recommend the following which can slow the progression of atherosclerosis and reduce the risk of major adverse cardiac / limb events:  Complete cessation from all tobacco products. Blood glucose control with goal A1c < 7%. Blood pressure control with goal blood pressure < 140/90 mmHg. Lipid reduction therapy with goal LDL-C <100 mg/dL (<70 if symptomatic from PAD).  Aspirin 50m PO QD.  Daily walking to and past the point of discomfort. Patient counseled to keep a log of exercise distance. Atorvastatin 40-80mg PO QD (or other "high intensity" statin therapy).  Follow up with me in 6 months to evaluate symptoms.  CHIEF COMPLAINT: calf cramping with walking  HISTORY OF PRESENT ILLNESS: Jon Herschis a 65y.o. male referred to clinic for evaluation of bilateral calf cramping with walking.  He has a strong history of coronary and peripheral arterial disease.  He reports cramping began several minutes after ambulating.  The pain begins in the left.  Pain then starts in the right.  This is relieved by rest.  He denies symptoms typical of ischemic rest pain.  He has no ischemic ulceration.  Past Medical History:  Diagnosis Date  . 5 years ago   . AICD (automatic cardioverter/defibrillator) present   . Anxiety   . Arthritis   . CHF (congestive heart failure) ([facility]   . Coronary artery disease   . Diabetes mellitus without complication ([facility]   . Dysrhythmia   . GERD (gastroesophageal reflux  disease)    PMH  . Hypertension   . Peripheral vascular disease ([facility]   . Peripheral vascular disease ([facility]   . Pneumonia   . Shoulder impingement, right   . Sleep apnea    wears cpap  . Stomach ulcer   . Wears glasses     Past Surgical History:  Procedure Laterality Date  . AMPUTATION Left 03/12/2019   Procedure: 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;  Surgeon: DNewt Minion MD;  [facility]  Service: Orthopedics;  Laterality: Left;  . CARDIAC CATHETERIZATION    . CARDIAC DEFIBRILLATOR PLACEMENT    . I & D EXTREMITY Left 07/19/2016   Procedure: IRRIGATION AND DEBRIDEMENT EXTREMITY/OLECRANON(WASHOUT);  Surgeon: XLeandrew Koyanagi MD;  [facility]  Service: Orthopedics;  Laterality: Left;  . OLECRANON BURSECTOMY Left 07/19/2016   Procedure: LEFT ELBOW OLECRANON BURSECTOMY;  Surgeon: XLeandrew Koyanagi MD;  [facility]  Service: Orthopedics;  Laterality: Left;  . SHOULDER ARTHROSCOPY Right 05/16/2019   Procedure: Right Shoulder Arthroscopy;  Surgeon: DNewt Minion MD;  [place] [facility]  Service: Orthopedics;  Laterality: Right;    Family History  Problem Relation Age of Onset  . Heart disease Mother   . Diabetes Mother   . Peripheral vascular disease Mother     Social History   Socioeconomic History  . Marital status: Married    Spouse name: Not on file  . Number of children: Not on file  . Years of education: Not on file  . Highest education level: Not on file  Occupational History  . Not on  file  Tobacco Use  . Smoking status: Former Smoker    Packs/day: 1.00    Years: 40.00    Pack years: 40.00    Types: Cigarettes    Quit date: 03/11/2019    Years since quitting: 1.2  . Smokeless tobacco: Never Used  Vaping Use  . Vaping Use: Never used  Substance and Sexual Activity  . Alcohol use: Not Currently  . Drug use: Yes    Types: Oxycodone  . Sexual activity: Not on file  Other Topics Concern  . Not on file  Social History Narrative  . Not on file    Social Determinants of Health   Financial Resource Strain: Not on file  Food Insecurity: Not on file  Transportation Needs: Not on file  Physical Activity: Not on file  Stress: Not on file  Social Connections: Not on file  Intimate Partner Violence: Not on file    No Known Allergies  Current Outpatient Medications  Medication Sig Dispense Refill  . Accu-Chek Softclix Lancets lancets Check blood sugar once daily. 100 each 2  . albuterol (PROAIR HFA) 108 (90 Base) MCG/ACT inhaler Inhale 2 puffs into the lungs every 6 (six) hours as needed for wheezing or shortness of breath. 8.5 g 2  . aspirin 81 MG EC tablet Take 81 mg by mouth daily.    . Blood Glucose Monitoring Suppl (ACCU-CHEK GUIDE ME) w/Device KIT Check blood sugar once daily. 1 kit 0  . clobetasol ointment (TEMOVATE) 6.22 % Apply 1 application topically 2 (two) times daily as needed. 30 g 0  . clopidogrel (PLAVIX) 75 MG tablet Take 75 mg by mouth daily.    . empagliflozin (JARDIANCE) 25 MG TABS tablet Take 25 mg by mouth at bedtime.     . gabapentin (NEURONTIN) 300 MG capsule Take 1 capsule (300 mg total) by mouth at bedtime. 30 capsule 5  . glipiZIDE (GLUCOTROL) 10 MG tablet Take 1 tablet (10 mg total) by mouth 2 (two) times daily. 180 tablet 0  . glucose blood (ACCU-CHEK GUIDE) test strip Check blood sugar once daily. 100 each 0  . metFORMIN (GLUCOPHAGE) 1000 MG tablet Take 1 tablet (1,000 mg total) by mouth 2 (two) times daily with a meal. 180 tablet 1  . nitroGLYCERIN (NITROSTAT) 0.4 MG SL tablet Place 0.4 mg under the tongue See admin instructions. Every 5 minutes as needed for chest pain. Max of 3 doses    . nystatin cream (MYCOSTATIN) SMARTSIG:Sparingly Topical 2-3 Times Daily PRN    . sitaGLIPtin (JANUVIA) 50 MG tablet Take 1 tablet (50 mg total) by mouth daily. 90 tablet 1  . spironolactone (ALDACTONE) 25 MG tablet Take 12.5 mg by mouth daily.    Marland Kitchen triamcinolone (KENALOG) 0.1 % SMARTSIG:Sparingly Topical Twice Daily     . atorvastatin (LIPITOR) 80 MG tablet Take 80 mg by mouth every evening.    Marland Kitchen buPROPion (WELLBUTRIN SR) 150 MG 12 hr tablet Take 150 mg by mouth 2 (two) times daily.  (Patient not taking: Reported on 06/08/2020)    . ezetimibe (ZETIA) 10 MG tablet Take 10 mg by mouth at bedtime.  (Patient not taking: Reported on 06/08/2020)    . metoprolol succinate (TOPROL-XL) 100 MG 24 hr tablet Take 100 mg by mouth daily.      No current facility-administered medications for this visit.    REVIEW OF SYSTEMS:  [X]  denotes positive finding, [ ]  denotes negative finding Cardiac  Comments:  Chest pain or chest pressure:  Shortness of breath upon exertion:    Short of breath when lying flat:    Irregular heart rhythm:        Vascular    Pain in calf, thigh, or hip brought on by ambulation: x   Pain in feet at night that wakes you up from your sleep:  x   Blood clot in your veins:    Leg swelling:  x       Pulmonary    Oxygen at home:    Productive cough:     Wheezing:         Neurologic    Sudden weakness in arms or legs:     Sudden numbness in arms or legs:     Sudden onset of difficulty speaking or slurred speech:    Temporary loss of vision in one eye:     Problems with dizziness:         Gastrointestinal    Blood in stool:     Vomited blood:         Genitourinary    Burning when urinating:     Blood in urine:        Psychiatric    Major depression:         Hematologic    Bleeding problems:    Problems with blood clotting too easily:        Skin    Rashes or ulcers:        Constitutional    Fever or chills:      PHYSICAL EXAM  Vitals:   06/08/20 1244  BP: 128/69  Pulse: 80  Resp: 20  Temp: 98.1 F (36.7 C)  SpO2: 97%  Weight: 236 lb (107 kg)  Height: 5' 6"  (1.676 m)    Constitutional: chronically ill appearing. no distress. Appears over nourished.  Neurologic: CN intact. no focal findings. no sensory loss. Psychiatric: Mood and affect symmetric and  appropriate. Eyes: No icterus. No conjunctival pallor. Ears, nose, throat: mucous membranes moist. Midline trachea.  Cardiac: regular rate and rhythm.  Respiratory: unlabored. Abdominal: soft, non-tender, non-distended.  Peripheral vascular:  Trace PT pulse bilaterally Extremity: No edema. No cyanosis. No pallor.  Skin: No gangrene. No ulceration.  Lymphatic: No Stemmer's sign. No palpable lymphadenopathy.  PERTINENT LABORATORY AND RADIOLOGIC DATA  Most recent CBC CBC Latest Ref Rng & Units 04/26/2020 05/16/2019 03/13/2019  WBC 3.4 - 10.8 x10E3/uL 7.4 - 9.5  Hemoglobin 13.0 - 17.7 g/dL 14.1 14.6 13.7  Hematocrit 37.5 - 51.0 % 44.8 43.0 42.4  Platelets 150 - 450 x10E3/uL 198 - 197     Most recent CMP CMP Latest Ref Rng & Units 04/26/2020 05/16/2019 03/13/2019  Glucose 65 - 99 mg/dL 327(H) 241(H) 119(H)  BUN 8 - 27 mg/dL 19 26(H) 19  Creatinine 0.76 - 1.27 mg/dL 1.19 1.00 1.01  Sodium 134 - 144 mmol/L 138 138 138  Potassium 3.5 - 5.2 mmol/L 4.8 4.3 4.2  Chloride 96 - 106 mmol/L 101 105 104  CO2 20 - 29 mmol/L 19(L) - 23  Calcium 8.6 - 10.2 mg/dL 9.6 - 9.0  Total Protein 6.0 - 8.5 g/dL 7.2 - -  Total Bilirubin 0.0 - 1.2 mg/dL 0.5 - -  Alkaline Phos 44 - 121 IU/L 75 - -  AST 0 - 40 IU/L 21 - -  ALT 0 - 44 IU/L 26 - -    Renal function CrCl cannot be calculated (Patient's most recent lab result is older than the maximum 21 days  allowed.).  HbA1c, POC (controlled diabetic range) (%)  Date Value  04/26/2020 10.1 (A)    LDL Chol Calc (NIH)  Date Value Ref Range Status  04/26/2020 25 0 - 99 mg/dL Corrected     Vascular Imaging:   Yevonne Aline. Stanford Breed, MD [facility] Phone Number: [phone] 06/08/2020 1:49 PM

## 2020-06-09 ENCOUNTER — Other Ambulatory Visit: Payer: Self-pay

## 2020-06-09 DIAGNOSIS — I70213 Atherosclerosis of native arteries of extremities with intermittent claudication, bilateral legs: Secondary | ICD-10-CM

## 2020-06-16 ENCOUNTER — Other Ambulatory Visit: Payer: Self-pay

## 2020-06-16 ENCOUNTER — Ambulatory Visit (INDEPENDENT_AMBULATORY_CARE_PROVIDER_SITE_OTHER): Payer: Medicaid Other | Admitting: Physical Medicine and Rehabilitation

## 2020-06-16 VITALS — BP 96/65 | HR 73

## 2020-06-16 DIAGNOSIS — M5416 Radiculopathy, lumbar region: Secondary | ICD-10-CM

## 2020-06-16 DIAGNOSIS — M48062 Spinal stenosis, lumbar region with neurogenic claudication: Secondary | ICD-10-CM | POA: Diagnosis not present

## 2020-06-16 DIAGNOSIS — M79662 Pain in left lower leg: Secondary | ICD-10-CM | POA: Diagnosis not present

## 2020-06-16 DIAGNOSIS — R32 Unspecified urinary incontinence: Secondary | ICD-10-CM | POA: Diagnosis not present

## 2020-06-16 NOTE — Progress Notes (Signed)
Patient has complaints about our scheduling process. Patient states that the first time he tried to schedule he had to come to the office and scream at the front desk to get scheduled and this time he had to call and scream after 3 weeks when he had not received a call to schedule.  Pain in left calf with walking. States he had ultrasound ordered by cardiologist and was told his veins were normal for his age and smoking history. Low back pain and sciatica has been much better since his injection. Numeric Pain Rating Scale and Functional Assessment Average Pain 10   In the last MONTH (on 0-10 scale) has pain interfered with the following?  1. General activity like being  able to carry out your everyday physical activities such as walking, climbing stairs, carrying groceries, or moving a chair?  Rating(9)

## 2020-06-18 NOTE — Progress Notes (Signed)
Phone call to patient to verify medication list and allergies for myelogram procedure. All medications pt is currently taking is safe to continue to take for myelogram procedure. Pt verbalized understanding. Pt also advised he would be with us around 2 hours, to have a driver the day of the procedure, and he would need to lay flat for 24 hours post procedure. Pt verbalized understanding.  

## 2020-06-21 DIAGNOSIS — G4733 Obstructive sleep apnea (adult) (pediatric): Secondary | ICD-10-CM | POA: Diagnosis not present

## 2020-06-23 ENCOUNTER — Other Ambulatory Visit: Payer: Self-pay | Admitting: Internal Medicine

## 2020-06-23 MED ORDER — LISINOPRIL 10 MG PO TABS: 10.0000 mg | ORAL_TABLET | Freq: Every day | ORAL | 0 refills | Status: DC

## 2020-06-25 ENCOUNTER — Other Ambulatory Visit: Payer: Self-pay | Admitting: *Deleted

## 2020-06-25 ENCOUNTER — Other Ambulatory Visit: Payer: Self-pay

## 2020-06-25 NOTE — Patient Outreach (Signed)
Care Coordination  06/25/2020  Tafari Humiston 12-23-1955 [id number]   Successful telephone outreach to Mr. Burchill. However, this morning he was driving and requested to reschedule this visit. He was thankful for the call and is interested in [facility] services. RNCM scheduled a new day and time for 06/28/20 @ 9am and will refer to [facility]. Mr. Clyne agreed to the new date and time.  Estanislado Emms RN, BSN [place]  [employer]

## 2020-06-28 ENCOUNTER — Other Ambulatory Visit: Payer: Self-pay | Admitting: *Deleted

## 2020-06-28 NOTE — Patient Instructions (Signed)
Visit Information  Mr. Burdett Pinzon  - as a part of your Medicaid benefit, you are eligible for care management and care coordination services at no cost or copay. I was unable to reach you by phone today but would be happy to help you with your health related needs. Please feel free to call me @ [phone].   A member of the Managed Medicaid care management team will reach out to you again over the next 7-14 days.   Estanislado Emms RN, BSN [place]  [employer]

## 2020-06-28 NOTE — Patient Outreach (Signed)
Care Coordination  06/28/2020  [name] Oct 15, 1955 [id number]    Medicaid Managed Care   Unsuccessful Outreach Note  06/28/2020 Name: Jon Jones MRN: [id number] DOB: [birth year]  Referred by: Marcine Matar, MD Reason for referral : High Risk Managed Medicaid (Unsuccessful RNCM outreach)   An unsuccessful telephone outreach was attempted today. The patient was referred to the case management team for assistance with care management and care coordination.   Follow Up Plan: A HIPAA compliant phone message was left for the patient providing contact information and requesting a return call.  The care management team will reach out to the patient again over the next 7-14 days.   Estanislado Emms RN, BSN [place]  [employer]

## 2020-06-29 ENCOUNTER — Ambulatory Visit
Admission: RE | Admit: 2020-06-29 | Discharge: 2020-06-29 | Disposition: A | Discharge status: Home / Self Care | Payer: Medicaid Other | Source: Ambulatory Visit | Attending: Physical Medicine and Rehabilitation | Admitting: Physical Medicine and Rehabilitation

## 2020-06-29 DIAGNOSIS — M4316 Spondylolisthesis, lumbar region: Secondary | ICD-10-CM | POA: Diagnosis not present

## 2020-06-29 DIAGNOSIS — M5416 Radiculopathy, lumbar region: Secondary | ICD-10-CM

## 2020-06-29 DIAGNOSIS — M48061 Spinal stenosis, lumbar region without neurogenic claudication: Secondary | ICD-10-CM | POA: Diagnosis not present

## 2020-06-29 DIAGNOSIS — M79662 Pain in left lower leg: Secondary | ICD-10-CM

## 2020-06-29 DIAGNOSIS — M48062 Spinal stenosis, lumbar region with neurogenic claudication: Secondary | ICD-10-CM

## 2020-06-29 DIAGNOSIS — M5126 Other intervertebral disc displacement, lumbar region: Secondary | ICD-10-CM | POA: Diagnosis not present

## 2020-06-29 MED ORDER — IOPAMIDOL (ISOVUE-M 200) INJECTION 41%
15.0000 mL | Freq: Once | INTRAMUSCULAR | Status: AC
  Administered 2020-06-29: 15 mL via INTRATHECAL

## 2020-06-29 MED ORDER — DIAZEPAM 5 MG PO TABS
10.0000 mg | ORAL_TABLET | Freq: Once | ORAL | Status: AC
  Administered 2020-06-29: 10 mg via ORAL

## 2020-06-29 NOTE — Discharge Instructions (Signed)

## 2020-06-30 ENCOUNTER — Other Ambulatory Visit: Payer: Self-pay | Admitting: Internal Medicine

## 2020-06-30 DIAGNOSIS — E1142 Type 2 diabetes mellitus with diabetic polyneuropathy: Secondary | ICD-10-CM

## 2020-07-01 ENCOUNTER — Encounter: Payer: Medicaid Other | Attending: Internal Medicine | Admitting: Dietician

## 2020-07-01 DIAGNOSIS — E1151 Type 2 diabetes mellitus with diabetic peripheral angiopathy without gangrene: Secondary | ICD-10-CM | POA: Insufficient documentation

## 2020-07-08 ENCOUNTER — Telehealth: Payer: Self-pay

## 2020-07-08 NOTE — Telephone Encounter (Signed)
Pt called asking to be tried again for an appt

## 2020-07-08 NOTE — Telephone Encounter (Signed)
-----   Message from Tyrell Antonio, MD sent at 06/29/2020  1:13 PM EDT ----- Regarding: please call He just had Ct myelogram completed that I ordered, we need to see him for OV to discuss

## 2020-07-08 NOTE — Telephone Encounter (Signed)
Called pt and LVM #1 

## 2020-07-09 ENCOUNTER — Encounter: Payer: Self-pay | Admitting: Physical Medicine and Rehabilitation

## 2020-07-09 NOTE — Progress Notes (Signed)
Jon Jones - 65 y.o. male MRN [id number]  Date of birth: 19-Aug-1955  Office Visit Note: Visit Date: 06/16/2020 PCP: Marcine Matar, MD Referred by: Marcine Matar, MD  Subjective: Chief Complaint  Patient presents with  . Lower Back - Pain  . Left Lower Leg - Pain   HPI: Jon Jones is a 65 y.o. male who comes in today For evaluation and management of worsening severe 10 out of 10 left hip and leg pain.  This is worse with standing and ambulating sometimes worse if he sits for a long time.  He has not noted any new focal weakness or any change in bowel or bladder.  He has had no specific new trauma.  He is followed in the office from an orthopedic standpoint by Dr. Burnard Bunting but mainly by Dr. Aldean Baker.  Patient has a significant history of diabetes and coronary artery disease and peripheral artery disease.  Since I have seen him he has had vascular studies of the lower legs not showing any claudication type problems.  He has had a long-term use of insulin likely does have some peripheral neuropathy.  His main complaint though this really strong left radicular type pain that did find relief with the diagnostic L5 transforaminal injection back in July of last year.  He reports for a good length of time he had significant relief of his left leg pain.  At the time we had agreed to do an injection based on his symptoms and we did not have any advanced imaging.  Patient does have an AICD and cannot have MRI.  Review of Systems  Musculoskeletal: Positive for back pain.       Left hip and leg pain  Neurological: Positive for tingling.  All other systems reviewed and are negative.  Otherwise per HPI.  Assessment & Plan: Visit Diagnoses:    ICD-10-CM   1. Lumbar radiculopathy  M54.16 CT LUMBAR SPINE W CONTRAST    DG MYELOGRAPHY LUMBAR INJ LUMBOSACRAL    CANCELED: DG Myelogram Lumbar    CANCELED: CT LUMBAR SPINE WO CONTRAST  2. Spinal stenosis of lumbar region with neurogenic  claudication  M48.062 CT LUMBAR SPINE W CONTRAST    DG MYELOGRAPHY LUMBAR INJ LUMBOSACRAL    CANCELED: DG Myelogram Lumbar    CANCELED: CT LUMBAR SPINE WO CONTRAST  3. Pain of left calf  M79.662 CT LUMBAR SPINE W CONTRAST    DG MYELOGRAPHY LUMBAR INJ LUMBOSACRAL    CANCELED: DG Myelogram Lumbar    CANCELED: CT LUMBAR SPINE WO CONTRAST     Plan: Findings:  Chronic worsening severe left L4-L5 radicular leg pain without any pain on the right with x-ray imaging of significant facet arthropathy and degenerative spine changes.  Complicated course history with medical conditions of type 2 insulin-dependent diabetes with peripheral angiopathy with peripheral vascular disease and coronary artery disease with history of AICD.  At this point before any other lumbar spine injections we need to get some type of advanced imaging just to rule out any spine disorders that I would take to cover up by doing intermittent injections and I talked him at length about that today.  We did order CT myelogram and I did explain that to him.  Once that is complete will have an office visit to go over those results and potentially repeat some type of injection at that point.  He is likely a very poor surgical candidate given his medical history.    Meds &  Orders: No orders of the defined types were placed in this encounter.   Orders Placed This Encounter  Procedures  . CT LUMBAR SPINE W CONTRAST  . DG MYELOGRAPHY LUMBAR INJ LUMBOSACRAL    Follow-up: Return for CT myelogram review after completion.   Procedures: No procedures performed      Clinical History: 07/14/2019 2 view radiographs of the lumbar spine shows advanced degenerative disc  disease with a grade 1 spondylolisthesis at L5-S1 anterior chronic  compression at T12-L1 and L2 with joint space narrowing anterior bony  spurs and calcification of the aorta without aneurysm.   He reports that he quit smoking about 15 months ago. His smoking use included  cigarettes. He has a 40.00 pack-year smoking history. He has never used smokeless tobacco.  Recent Labs    04/26/20 0913  HGBA1C 10.1*    Objective:  VS:  HT:    WT:   BMI:     BP:96/65  HR:73bpm  TEMP: ( )  RESP:  Physical Exam Vitals and nursing note reviewed.  Constitutional:      General: He is not in acute distress.    Appearance: Normal appearance. He is obese. He is not ill-appearing.  HENT:     Head: Normocephalic and atraumatic.     Right Ear: External ear normal.     Left Ear: External ear normal.     Nose: No congestion.  Eyes:     Extraocular Movements: Extraocular movements intact.  Cardiovascular:     Rate and Rhythm: Normal rate.     Pulses: Normal pulses.  Pulmonary:     Effort: Pulmonary effort is normal. No respiratory distress.  Abdominal:     General: There is no distension.     Palpations: Abdomen is soft.  Musculoskeletal:        General: No tenderness or signs of injury.     Cervical back: Neck supple.     Right lower leg: No edema.     Left lower leg: No edema.     Comments: Patient has good distal strength without clonus.  He has some pain over the left greater trochanter more than the right.  He has pain with extension of the lumbar spine and facet loading consistent with facet mediated back pain.  He does not have any focal trigger points.  He has some pain over the left PSIS but not the right.  Equivocal slump test on the left.  He does have some sensation changes more of a stocking distribution.  Skin:    Findings: No erythema or rash.  Neurological:     General: No focal deficit present.     Mental Status: He is alert and oriented to person, place, and time.     Cranial Nerves: No cranial nerve deficit.     Sensory: Sensory deficit present.     Motor: No weakness or abnormal muscle tone.     Coordination: Coordination normal.     Gait: Gait normal.  Psychiatric:        Mood and Affect: Mood normal.        Behavior: Behavior normal.      Ortho Exam  Imaging: No results found.  Past Medical/Family/Surgical/Social History: Medications & Allergies reviewed per EMR, new medications updated. Patient Active Problem List   Diagnosis Date Noted  . 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 diabetes mellitus ([name]) 05/04/2020  . Hypertensive heart disease with heart failure ([name]) 04/26/2020  . Type 2 diabetes mellitus with diabetic peripheral  angiopathy without gangrene, without long-term current use of insulin ([name]) 04/26/2020  . OSA on CPAP 04/26/2020  . PAD (peripheral artery disease) ([name]) 04/26/2020  . CAD in native artery 04/26/2020  . Class 2 severe obesity due to excess calories with serious comorbidity and body mass index (BMI) of 38.0 to 38.9 in adult ([name]) 04/26/2020  . Impingement syndrome of right shoulder   . Nontraumatic complete tear of right rotator cuff   . Type 2 diabetes mellitus with foot ulcer, without long-term current use of insulin ([name])   . 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 S/P debridement 07/19/2016  . Acute gout of left elbow 07/19/2016   Past Medical History:  Diagnosis Date  . 5 years ago   . AICD (automatic cardioverter/defibrillator) present   . Anxiety   . Arthritis   . CHF (congestive heart failure) ([name])   . Coronary artery disease   . Diabetes mellitus without complication ([name])   . Dysrhythmia   . GERD (gastroesophageal reflux disease)    PMH  . Hypertension   . Peripheral vascular disease ([name])   . Peripheral vascular disease ([name])   . Pneumonia   . Shoulder impingement, right   . Sleep apnea    wears cpap  . Stomach ulcer   . Wears glasses    Family History  Problem Relation Age of Onset  . Heart disease Mother   . Diabetes Mother   . Peripheral vascular disease Mother    Past Surgical History:  Procedure Laterality Date  . AMPUTATION Left 03/12/2019   Procedure: 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;  Surgeon: [name], MD;  Location: [facility] OR;   Service: Orthopedics;  Laterality: Left;  . CARDIAC CATHETERIZATION    . CARDIAC DEFIBRILLATOR PLACEMENT    . I & D EXTREMITY Left 07/19/2016   Procedure: IRRIGATION AND DEBRIDEMENT EXTREMITY/OLECRANON(WASHOUT);  Surgeon: Tarry Kos, MD;  Location: [facility] OR;  Service: Orthopedics;  Laterality: Left;  . OLECRANON BURSECTOMY Left 07/19/2016   Procedure: LEFT ELBOW OLECRANON BURSECTOMY;  Surgeon: Tarry Kos, MD;  Location: [facility];  Service: Orthopedics;  Laterality: Left;  . SHOULDER ARTHROSCOPY Right 05/16/2019   Procedure: Right Shoulder Arthroscopy;  Surgeon: [name], MD;  Location: [facility] OR;  Service: Orthopedics;  Laterality: Right;   Social History   Occupational History  . Not on file  Tobacco Use  . Smoking status: Former Smoker    Packs/day: 1.00    Years: 40.00    Pack years: 40.00    Types: Cigarettes    Quit date: 03/11/2019    Years since quitting: 1.3  . Smokeless tobacco: Never Used  Vaping Use  . Vaping Use: Never used  Substance and Sexual Activity  . Alcohol use: Not Currently  . Drug use: Yes    Types: Oxycodone  . Sexual activity: Not on file

## 2020-07-09 NOTE — Telephone Encounter (Signed)
Pt called and sch 5/11

## 2020-07-11 DIAGNOSIS — R32 Unspecified urinary incontinence: Secondary | ICD-10-CM | POA: Diagnosis not present

## 2020-07-16 ENCOUNTER — Telehealth: Payer: Self-pay | Admitting: Internal Medicine

## 2020-07-16 DIAGNOSIS — G4733 Obstructive sleep apnea (adult) (pediatric): Secondary | ICD-10-CM

## 2020-07-16 NOTE — Telephone Encounter (Signed)
Patient called to request an order for his Cpap supplies.  Patient call to give the name of company, [employer], and fax# [phone], to send the orders.  Please call to discuss at [phone]

## 2020-07-19 ENCOUNTER — Encounter: Payer: Self-pay | Admitting: Cardiovascular Disease

## 2020-07-19 ENCOUNTER — Ambulatory Visit (INDEPENDENT_AMBULATORY_CARE_PROVIDER_SITE_OTHER): Payer: Medicaid Other | Admitting: Cardiovascular Disease

## 2020-07-19 ENCOUNTER — Other Ambulatory Visit: Payer: Self-pay

## 2020-07-19 VITALS — BP 102/58 | HR 79 | Ht 65.5 in | Wt 235.8 lb

## 2020-07-19 DIAGNOSIS — Z95810 Presence of automatic (implantable) cardiac defibrillator: Secondary | ICD-10-CM

## 2020-07-19 DIAGNOSIS — I2510 Atherosclerotic heart disease of native coronary artery without angina pectoris: Secondary | ICD-10-CM

## 2020-07-19 DIAGNOSIS — I5042 Chronic combined systolic (congestive) and diastolic (congestive) heart failure: Secondary | ICD-10-CM

## 2020-07-19 DIAGNOSIS — E782 Mixed hyperlipidemia: Secondary | ICD-10-CM

## 2020-07-19 NOTE — Progress Notes (Signed)
Cardiology Office Note:    Date:  07/19/2020   ID:  Jon Jones, DOB 1955/07/28, MRN [id number]  PCP:  Ladell Pier, MD   [facility] Providers Cardiologist:  NoneSchumann Click to update primary MD,subspecialty MD or APP then REFRESH:1}    Referring MD: Ladell Pier, MD   Chief Complaint  Patient presents with  . Pacemaker Check  ICD  History of Present Illness:    Jon Jones is a 65 y.o. male with a hx of severe ischemic cardiomyopathy with LVEF 35% following delayed presentation with an extensive anterior wall myocardial infarction in 2016 (previous inferior myocardial infarction in 2015), status postimplantation of a dual-chamber ICD in 2018 at [facility] ([employer] evera DR) now relocated to [place].  He is seeing Dr. Oswaldo Milian as his primary cardiologist and wishes to establish ICD follow-up through our practice as well.  Patient will medical problems include obesity, type 2 diabetes mellitus, essential hypertension, hyperlipidemia, OSA on CPAP, PAD  His defibrillator has never discharged or delivered antitachycardia therapy.  Device interrogation shows normal function.  Estimated generator longevity 6.9 years and all lead parameters were actively checked and found to be normal today.  Auto capture is on.  He has 2.6% atrial pacing and never requires ventricular pacing (programmed AAI-DDD at 60 bpm).  Tachycardia therapies are programmed with a single VF zone at 200 bpm, with ATP during charging 5 consecutive shocks at 35 J.  Thoracic impedance showed that his OptiVol briefly went above baseline following the Christmas holidays and has come back to normal range.  He has NYHA functional class II status (dyspnea climbing stairs, no problems on on level ground) the patient specifically denies any chest pain at rest or light exertion, dyspnea at rest or with exertion, orthopnea, paroxysmal nocturnal dyspnea, syncope, palpitations, focal neurological deficits,  intermittent claudication, lower extremity edema, unexplained weight gain, cough, hemoptysis or wheezing.  His previous angina was described as heaviness in left side of his chest radiating to the left arm.   Past Medical History:  Diagnosis Date  . 5 years ago   . AICD (automatic cardioverter/defibrillator) present   . Anxiety   . Arthritis   . CHF (congestive heart failure) ([place])   . Coronary artery disease   . Diabetes mellitus without complication ([place])   . Dysrhythmia   . GERD (gastroesophageal reflux disease)    PMH  . Hypertension   . Peripheral vascular disease ([place])   . Peripheral vascular disease ([place])   . Pneumonia   . Shoulder impingement, right   . Sleep apnea    wears cpap  . Stomach ulcer   . Wears glasses     Past Surgical History:  Procedure Laterality Date  . AMPUTATION Left 03/12/2019   Procedure: 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;  Surgeon: [name], MD;  Location: [place];  Service: Orthopedics;  Laterality: Left;  . CARDIAC CATHETERIZATION    . CARDIAC DEFIBRILLATOR PLACEMENT    . I & D EXTREMITY Left 07/19/2016   Procedure: IRRIGATION AND DEBRIDEMENT EXTREMITY/OLECRANON(WASHOUT);  Surgeon: Leandrew Koyanagi, MD;  Location: [place];  Service: Orthopedics;  Laterality: Left;  . OLECRANON BURSECTOMY Left 07/19/2016   Procedure: LEFT ELBOW OLECRANON BURSECTOMY;  Surgeon: Leandrew Koyanagi, MD;  Location: [place];  Service: Orthopedics;  Laterality: Left;  . SHOULDER ARTHROSCOPY Right 05/16/2019   Procedure: Right Shoulder Arthroscopy;  Surgeon: [name], MD;  Location: [place];  Service: Orthopedics;  Laterality: Right;  Current Medications: Current Meds  Medication Sig  . Accu-Chek Softclix Lancets lancets Check blood sugar once daily.  Marland Kitchen albuterol (PROAIR HFA) 108 (90 Base) MCG/ACT inhaler Inhale 2 puffs into the lungs every 6 (six) hours as needed for wheezing or shortness of breath.  Marland Kitchen aspirin 81 MG EC tablet Take 81 mg by mouth daily.  . Blood  Glucose Monitoring Suppl (ACCU-CHEK GUIDE ME) w/Device KIT Check blood sugar once daily.  . clobetasol ointment (TEMOVATE) 8.24 % Apply 1 application topically 2 (two) times daily as needed.  . clopidogrel (PLAVIX) 75 MG tablet Take 75 mg by mouth daily.  . empagliflozin (JARDIANCE) 25 MG TABS tablet Take 25 mg by mouth at bedtime.   . gabapentin (NEURONTIN) 300 MG capsule Take 1 capsule (300 mg total) by mouth at bedtime.  Marland Kitchen glipiZIDE (GLUCOTROL) 10 MG tablet Take 1 tablet (10 mg total) by mouth 2 (two) times daily.  Marland Kitchen glucose blood (ACCU-CHEK GUIDE) test strip Check blood sugar once daily.  Marland Kitchen lisinopril (ZESTRIL) 10 MG tablet Take 1 tablet (10 mg total) by mouth daily.  . metFORMIN (GLUCOPHAGE) 1000 MG tablet Take 1 tablet (1,000 mg total) by mouth 2 (two) times daily with a meal.  . nitroGLYCERIN (NITROSTAT) 0.4 MG SL tablet Place 0.4 mg under the tongue See admin instructions. Every 5 minutes as needed for chest pain. Max of 3 doses  . nystatin cream (MYCOSTATIN) SMARTSIG:Sparingly Topical 2-3 Times Daily PRN  . sitaGLIPtin (JANUVIA) 50 MG tablet Take 1 tablet (50 mg total) by mouth daily.  Marland Kitchen spironolactone (ALDACTONE) 25 MG tablet Take 12.5 mg by mouth daily.  Marland Kitchen triamcinolone (KENALOG) 0.1 % SMARTSIG:Sparingly Topical Twice Daily     Allergies:   Patient has no known allergies.   Social History   Socioeconomic History  . Marital status: Married    Spouse name: Not on file  . Number of children: Not on file  . Years of education: Not on file  . Highest education level: Not on file  Occupational History  . Not on file  Tobacco Use  . Smoking status: Former Smoker    Packs/day: 1.00    Years: 40.00    Pack years: 40.00    Types: Cigarettes    Quit date: 03/11/2019    Years since quitting: 1.3  . Smokeless tobacco: Never Used  Vaping Use  . Vaping Use: Never used  Substance and Sexual Activity  . Alcohol use: Not Currently  . Drug use: Yes    Types: Oxycodone  . Sexual  activity: Not on file  Other Topics Concern  . Not on file  Social History Narrative  . Not on file   Social Determinants of Health   Financial Resource Strain: Not on file  Food Insecurity: Not on file  Transportation Needs: Not on file  Physical Activity: Not on file  Stress: Not on file  Social Connections: Not on file     Family History: The patient's family history includes Diabetes in his mother; Heart disease in his mother; Peripheral vascular disease in his mother.  ROS:   Please see the history of present illness.     All other systems reviewed and are negative.  EKGs/Labs/Other Studies Reviewed:    The following studies were reviewed today: Notes from Dr. Gardiner Rhyme  EKG:  EKG is not ordered today.  The ekg ordered 05/21/2020 demonstrates sinus rhythm with inferior Q waves Recent Labs: 04/26/2020: ALT 26; BUN 19; Creatinine, Ser 1.19; Hemoglobin 14.1; Platelets 198;  Potassium 4.8; Sodium 138  Recent Lipid Panel    Component Value Date/Time   CHOL 93 (L) 04/26/2020 1002   TRIG 289 (H) 04/26/2020 1002   HDL 25 (L) 04/26/2020 1002   CHOLHDL 3.7 04/26/2020 1002   LDLCALC 25 04/26/2020 1002     Risk Assessment/Calculations:       Physical Exam:    VS:  BP (!) 102/58   Pulse 79   Ht 5' 5.5" (1.664 m)   Wt 235 lb 12.8 oz (107 kg)   SpO2 97%   BMI 38.64 kg/m     Wt Readings from Last 3 Encounters:  07/19/20 235 lb 12.8 oz (107 kg)  06/08/20 236 lb (107 kg)  05/21/20 234 lb 3.2 oz (106.2 kg)     GEN: Severely obese, well nourished, well developed in no acute distress.  Left subclavian defibrillator site looks healthy. HEENT: Normal NECK: No JVD; No carotid bruits LYMPHATICS: No lymphadenopathy CARDIAC: RRR, no murmurs, rubs, gallops RESPIRATORY:  Clear to auscultation without rales, wheezing or rhonchi  ABDOMEN: Soft, non-tender, non-distended MUSCULOSKELETAL:  No edema; No deformity  SKIN: Warm and dry NEUROLOGIC:  Alert and oriented x  3 PSYCHIATRIC:  Normal affect   ASSESSMENT:    No diagnosis found. PLAN:    In order of problems listed above:  1. CHF: Appears well compensated, NYHA functional class II, euvolemic clinically and by OptiVol download.  He is on a relatively low-dose of ACE inhibitor, on metoprolol succinate, spironolactone and SGLT2 inhibitor.  If blood pressure allows, can consider transitioning from ACE inhibitor to [place], but this may not be possible. 2. CAD: History of both inferior and anterior infarctions in the past, currently free of angina.  On aspirin, statin, beta-blocker. 3. ICD: implanted for primary prevention.  Has not received therapy to date.  Normal device function.  Will transfer to our device clinic and continue remote downloads every 3 months. 4. HLP: most recent LDL cholesterol is excellent at 25.  It is also very low at 25 and triglycerides are mildly elevated.  Both these parameters should improve with improved glycemic control, weight loss, exercise. 5. DM: Glycemic control is poor with most recent hemoglobin A1c of 10.1%.  Note mildly abnormal creatinine at 1.19.        Medication Adjustments/Labs and Tests Ordered: Current medicines are reviewed at length with the patient today.  Concerns regarding medicines are outlined above.  No orders of the defined types were placed in this encounter.  No orders of the defined types were placed in this encounter.   Patient Instructions  Medication Instructions:  No changes *If you need a refill on your cardiac medications before your next appointment, please call your pharmacy*   Lab Work: None ordered If you have labs (blood work) drawn today and your tests are completely normal, you will receive your results only by: Marland Kitchen MyChart Message (if you have MyChart) OR . A paper copy in the mail If you have any lab test that is abnormal or we need to change your treatment, we will call you to review the  results.   Testing/Procedures: None ordered   Follow-Up: At [facility], you and your health needs are our priority.  As part of our continuing mission to provide you with exceptional heart care, we have created designated Provider Care Teams.  These Care Teams include your primary Cardiologist (physician) and Advanced Practice Providers (APPs -  Physician Assistants and Nurse Practitioners) who all work together to provide  you with the care you need, when you need it.  We recommend signing up for the patient portal called "MyChart".  Sign up information is provided on this After Visit Summary.  MyChart is used to connect with patients for Virtual Visits (Telemedicine).  Patients are able to view lab/test results, encounter notes, upcoming appointments, etc.  Non-urgent messages can be sent to your provider as well.   To learn more about what you can do with MyChart, go to [email or web address].    Your next appointment:   12 month(s)  The format for your next appointment:   In Person  Provider:   Sanda Klein, MD       Signed, Sanda Klein, MD  07/19/2020 2:54 PM    [place]

## 2020-07-19 NOTE — Patient Instructions (Signed)

## 2020-07-20 ENCOUNTER — Other Ambulatory Visit: Payer: Self-pay | Admitting: Internal Medicine

## 2020-07-20 ENCOUNTER — Other Ambulatory Visit: Payer: Self-pay

## 2020-07-20 ENCOUNTER — Telehealth: Payer: Self-pay | Admitting: Internal Medicine

## 2020-07-20 MED ORDER — MISC. DEVICES MISC: 0 refills | Status: AC

## 2020-07-20 NOTE — Telephone Encounter (Signed)
Returned pt call and made aware that rx was faxed this morning. Pt states he contacted [facility] and they didn't receive anything. Pt states to fax it to the 336 number. Made pt aware that I will re-fax rx now

## 2020-07-20 NOTE — Telephone Encounter (Signed)
Rx has been faxed to [facility]

## 2020-07-20 NOTE — Telephone Encounter (Signed)
Patient requesting Cpap supplies-tubing mask please fax to  [facility] fax # [phone] phone # [phone], patient would like a follow up call when completed.

## 2020-07-21 ENCOUNTER — Other Ambulatory Visit: Payer: Self-pay

## 2020-07-21 ENCOUNTER — Ambulatory Visit (INDEPENDENT_AMBULATORY_CARE_PROVIDER_SITE_OTHER): Payer: Medicaid Other | Admitting: Physical Medicine and Rehabilitation

## 2020-07-21 ENCOUNTER — Encounter: Payer: Self-pay | Admitting: Physical Medicine and Rehabilitation

## 2020-07-21 VITALS — BP 102/67 | HR 77

## 2020-07-21 DIAGNOSIS — M4317 Spondylolisthesis, lumbosacral region: Secondary | ICD-10-CM

## 2020-07-21 DIAGNOSIS — M47816 Spondylosis without myelopathy or radiculopathy, lumbar region: Secondary | ICD-10-CM

## 2020-07-21 DIAGNOSIS — M5416 Radiculopathy, lumbar region: Secondary | ICD-10-CM

## 2020-07-21 DIAGNOSIS — M48062 Spinal stenosis, lumbar region with neurogenic claudication: Secondary | ICD-10-CM

## 2020-07-21 NOTE — Progress Notes (Signed)
Jon Jones - 65 y.o. male MRN [id number]  Date of birth: 04-11-1955  Office Visit Note: Visit Date: 07/21/2020 PCP: Marcine Matar, MD Referred by: Marcine Matar, MD  Subjective: Chief Complaint  Patient presents with   Lower Back - Pain   HPI: Jon Jones is a 65 y.o. male who comes in today For follow-up evaluation and management of chronic and worsening and severe low back pain and in particular pain into the right buttock and leg to the knee.  He has had posterior leg pain into the hamstrings bilaterally.  Worse with standing and walking.  He has ongoing coccydynia as well.  He rates the coccydynia as a problem from a fall about a year ago.  He does not necessarily relate the 2.  We have seen him in the past and we did order CT myelogram of the lumbar spine.  His case is complicated by history of poorly controlled type 2 diabetes.  Last hemoglobin A1c was 10.  He has heart disease and he is on Plavix.  He has no focal weakness no red flag complaints from a lumbar spine standpoint.  I did review the myelogram with him today using spine models and imaging.  He rates his pain as a 6 out of 10 average but it can be as high as 10 out of 10 with certain movements.  It does really limit what he can do and he is really looking for some relief with this pain at this point.  The myelogram was problematic and showing a listhesis of L5 on S1 that appears to be dynamic with movement and it shows facet arthropathy encroaching on the canal with pretty significant stenosis.  He also has moderate stenosis at L4-5.  Review of Systems  Musculoskeletal:  Positive for back pain and joint pain.       Right greater than left hip and leg  All other systems reviewed and are negative. Otherwise per HPI.  Assessment & Plan: Visit Diagnoses:    ICD-10-CM   1. Lumbar radiculopathy  M54.16 Ambulatory referral to Neurosurgery    2. Spondylolisthesis of lumbosacral region  M43.17 Ambulatory referral to  Neurosurgery    3. Spondylosis without myelopathy or radiculopathy, lumbar region  M47.816 Ambulatory referral to Neurosurgery    4. Spinal stenosis of lumbar region with neurogenic claudication  M48.062        Plan: Findings:  Chronic severe recalcitrant low back pain with referral in the right more than right hip and leg posteriorly to the knee in a pretty classic S1 distribution versus L5-S1 facet joint referral pattern.  He has significant listhesis that is dynamic with severe stenosis at L5-S1.  Moderate stenosis at L4-5.  He really likely need surgery to correct this if possible even though his case is complicated by anticoagulation and diabetes and peripheral artery disease.  We are going to refer him to a neurosurgeon for evaluation.  I also want to try bilateral S1 transforaminal injection just to see how much relief we can get for him.  He may end up needing chronic pain management for supervision if he is not a surgical candidate.   Meds & Orders: No orders of the defined types were placed in this encounter.   Orders Placed This Encounter  Procedures   Ambulatory referral to Neurosurgery    Follow-up: Return for Bilateral S1 transforaminal epidural steroid injection.   Procedures: No procedures performed      Clinical History: Ct Myelogram IMPRESSION:  1. L5-S1: Severe multifactorial spinal stenosis. Advanced bilateral facet arthropathy with 5 mm of anterolisthesis which increases to 8 mm with standing and flexion. Bulging of the disc. Complex synovial cyst arising from the facet joint on the left within the posterior left side of the spinal canal. Marked constriction of the thecal sac due to these processes in combination with abundant epidural fat. Severe bilateral foraminal stenosis could compress either or both exiting L5 nerves. 2. L4-5: Moderate multifactorial spinal stenosis. 3. T12-L1: Right foraminal encroachment by osteophyte could possibly affect the right T12  nerve.   Aortic Atherosclerosis (ICD10-I70.0).     Electronically Signed   By: Paulina Fusi M.D.   On: 06/29/2020 12:39   He reports that he quit smoking about 17 months ago. His smoking use included cigarettes. He has a 40.00 pack-year smoking history. He has never used smokeless tobacco.  Recent Labs    04/26/20 0913 07/23/20 0933  HGBA1C 10.1* 11.0*    Objective:  VS:  HT:    WT:   BMI:     BP:102/67  HR:77bpm  TEMP: ( )  RESP:  Physical Exam Vitals and nursing note reviewed.  Constitutional:      General: He is not in acute distress.    Appearance: Normal appearance. He is obese. He is not ill-appearing.  HENT:     Head: Normocephalic and atraumatic.     Right Ear: External ear normal.     Left Ear: External ear normal.     Nose: No congestion.  Eyes:     Extraocular Movements: Extraocular movements intact.  Cardiovascular:     Rate and Rhythm: Normal rate.     Pulses: Normal pulses.  Pulmonary:     Effort: Pulmonary effort is normal. No respiratory distress.  Abdominal:     General: There is no distension.     Palpations: Abdomen is soft.  Musculoskeletal:        General: No tenderness or signs of injury.     Cervical back: Neck supple.     Right lower leg: Edema present.     Left lower leg: Edema present.     Comments: Patient has good distal strength without clonus. Patient somewhat slow to rise from a seated position to full extension.  There is concordant low back pain with facet loading and lumbar spine extension rotation.  There are no definitive trigger points but the patient is somewhat tender across the lower back and PSIS.  There is no pain with hip rotation.   Skin:    Findings: No erythema or rash.  Neurological:     General: No focal deficit present.     Mental Status: He is alert and oriented to person, place, and time.     Sensory: Sensory deficit present.     Motor: No weakness or abnormal muscle tone.     Coordination: Coordination  normal.     Gait: Gait normal.  Psychiatric:        Mood and Affect: Mood normal.        Behavior: Behavior normal.    Ortho Exam  Imaging: No results found.  Past Medical/Family/Surgical/Social History: Medications & Allergies reviewed per EMR, new medications updated. Patient Active Problem List   Diagnosis Date Noted   Onychomycosis of toenail 08/24/2020   Diabetes mellitus ([name]) 07/23/2020   Type 2 diabetes mellitus with hyperglycemia, without long-term current use of insulin ([name]) 07/23/2020   Weight gain 07/23/2020   Type 2 diabetes mellitus with  peripheral neuropathy ([name]) 07/23/2020   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 diabetes mellitus ([name]) 05/04/2020   Hypertensive heart disease with heart failure ([name]) 04/26/2020   Type 2 diabetes mellitus with diabetic peripheral angiopathy without gangrene, without long-term current use of insulin ([name]) 04/26/2020   OSA on CPAP 04/26/2020   PAD (peripheral artery disease) ([name]) 04/26/2020   CAD in native artery 04/26/2020   Class 2 severe obesity due to excess calories with serious comorbidity and body mass index (BMI) of 38.0 to 38.9 in adult [facility]) 04/26/2020   Impingement syndrome of right shoulder    Nontraumatic complete tear of right rotator cuff    Type 2 diabetes mellitus with foot ulcer, without long-term current use of insulin ([name])    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   S/P debridement 07/19/2016   Acute gout of left elbow 07/19/2016   Past Medical History:  Diagnosis Date   5 years ago    AICD (automatic cardioverter/defibrillator) present    Anxiety    Arthritis    Cataract    CHF (congestive heart failure) ([name])    Coronary artery disease    Diabetes mellitus without complication ([name])    Diabetic retinopathy ([name])    Dysrhythmia    GERD (gastroesophageal reflux disease)    PMH   Hypertension    Hypertensive retinopathy    Peripheral vascular disease ([name])    Peripheral vascular  disease ([name])    Pneumonia    Shoulder impingement, right    Sleep apnea    wears cpap   Stomach ulcer    Wears glasses    Family History  Problem Relation Age of Onset   Heart disease Mother    Diabetes Mother    Peripheral vascular disease Mother    Past Surgical History:  Procedure Laterality Date   AMPUTATION Left 03/12/2019   Procedure: 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;  Surgeon: [name], MD;  Location: [facility] OR;  Service: Orthopedics;  Laterality: Left;   CARDIAC CATHETERIZATION     CARDIAC DEFIBRILLATOR PLACEMENT     I & D EXTREMITY Left 07/19/2016   Procedure: IRRIGATION AND DEBRIDEMENT EXTREMITY/OLECRANON(WASHOUT);  Surgeon: Tarry Kos, MD;  Location: [facility] OR;  Service: Orthopedics;  Laterality: Left;   OLECRANON BURSECTOMY Left 07/19/2016   Procedure: LEFT ELBOW OLECRANON BURSECTOMY;  Surgeon: Tarry Kos, MD;  Location: [facility];  Service: Orthopedics;  Laterality: Left;   SHOULDER ARTHROSCOPY Right 05/16/2019   Procedure: Right Shoulder Arthroscopy;  Surgeon: [name], MD;  Location: [facility] OR;  Service: Orthopedics;  Laterality: Right;   Social History   Occupational History   Not on file  Tobacco Use   Smoking status: Former    Packs/day: 1.00    Years: 40.00    Pack years: 40.00    Types: Cigarettes    Quit date: 03/11/2019    Years since quitting: 1.4   Smokeless tobacco: Never  Vaping Use   Vaping Use: Never used  Substance and Sexual Activity   Alcohol use: Not Currently   Drug use: Yes    Types: Oxycodone   Sexual activity: Not on file

## 2020-07-21 NOTE — Progress Notes (Signed)
CT Myelogram review. Pain in low back and right buttock/ leg to knee. Pain worse with walking. Coccyx pain from fall about a year ago.  Numeric Pain Rating Scale and Functional Assessment Average Pain 6   In the last MONTH (on 0-10 scale) has pain interfered with the following?  1. General activity like being  able to carry out your everyday physical activities such as walking, climbing stairs, carrying groceries, or moving a chair?  Rating(9)

## 2020-07-22 ENCOUNTER — Ambulatory Visit (INDEPENDENT_AMBULATORY_CARE_PROVIDER_SITE_OTHER): Payer: Medicaid Other | Admitting: Physical Medicine and Rehabilitation

## 2020-07-22 ENCOUNTER — Encounter: Payer: Self-pay | Admitting: Internal Medicine

## 2020-07-22 ENCOUNTER — Encounter: Payer: Self-pay | Admitting: Physical Medicine and Rehabilitation

## 2020-07-22 ENCOUNTER — Other Ambulatory Visit: Payer: Self-pay

## 2020-07-22 ENCOUNTER — Ambulatory Visit: Payer: Self-pay

## 2020-07-22 VITALS — BP 120/77 | HR 76

## 2020-07-22 DIAGNOSIS — M5416 Radiculopathy, lumbar region: Secondary | ICD-10-CM | POA: Diagnosis not present

## 2020-07-22 DIAGNOSIS — M48062 Spinal stenosis, lumbar region with neurogenic claudication: Secondary | ICD-10-CM | POA: Diagnosis not present

## 2020-07-22 MED ORDER — METHYLPREDNISOLONE ACETATE 80 MG/ML IJ SUSP
80.0000 mg | Freq: Once | INTRAMUSCULAR | Status: AC
  Administered 2020-07-22: 80 mg

## 2020-07-22 NOTE — Progress Notes (Signed)
Jon Jones - 65 y.o. male MRN [id number]  Date of birth: [birth year]  Office Visit Note: Visit Date: 07/22/2020 PCP: Marcine Matar, MD Referred by: Marcine Matar, MD  Subjective: Chief Complaint  Patient presents with  . Lower Back - Pain  . Left Leg - Pain  . Right Leg - Pain   HPI:  Jon Jones is a 65 y.o. male who comes in today at the request of Dr. Naaman Plummer for planned Bilateral S1-2 Lumbar epidural steroid injection with fluoroscopic guidance.  The patient has failed conservative care including home exercise, medications, time and activity modification.  This injection will be diagnostic and hopefully therapeutic.  Please see requesting physician notes for further details and justification.   ROS Otherwise per HPI.  Assessment & Plan: Visit Diagnoses:    ICD-10-CM   1. Lumbar radiculopathy  M54.16 XR C-ARM NO REPORT    Epidural Steroid injection    methylPREDNISolone acetate (DEPO-MEDROL) injection 80 mg  2. Spinal stenosis of lumbar region with neurogenic claudication  M48.062 XR C-ARM NO REPORT    Epidural Steroid injection    methylPREDNISolone acetate (DEPO-MEDROL) injection 80 mg    Plan: No additional findings.   Meds & Orders:  Meds ordered this encounter  Medications  . methylPREDNISolone acetate (DEPO-MEDROL) injection 80 mg    Orders Placed This Encounter  Procedures  . XR C-ARM NO REPORT  . Epidural Steroid injection    Follow-up: Return if symptoms worsen or fail to improve.   Procedures: No procedures performed  S1 Lumbosacral Transforaminal Epidural Steroid Injection - Sub-Pedicular Approach with Fluoroscopic Guidance   [name]      Date of Birth: 1955-11-19 MRN: [id number] PCP: Marcine Matar, MD      Visit Date: 07/22/2020   Universal Protocol:    Date/Time: 05/12/223:05 PM  Consent Given By: the patient  Position:  PRONE  Additional Comments: Vital signs were monitored before and after the  procedure. Patient was prepped and draped in the usual sterile fashion. The correct patient, procedure, and site was verified.   Injection Procedure Details:  Procedure Site One Meds Administered:  Meds ordered this encounter  Medications  . methylPREDNISolone acetate (DEPO-MEDROL) injection 80 mg    Laterality: Bilateral  Location/Site:  S1 Foramen   Needle size: 22 ga.  Needle type: Spinal  Needle Placement: Transforaminal  Findings:   -Comments: Excellent flow of contrast along the nerve, nerve root and into the epidural space.  Epidurogram: Contrast epidurogram showed no nerve root cut off or restricted flow pattern.  Procedure Details: After squaring off the sacral end-plate to get a true AP view, the C-arm was positioned so that the best possible view of the S1 foramen was visualized. The soft tissues overlying this structure were infiltrated with 2-3 ml. of 1% Lidocaine without Epinephrine.    The spinal needle was inserted toward the target using a "trajectory" view along the fluoroscope beam.  Under AP and lateral visualization, the needle was advanced so it did not puncture dura. Biplanar projections were used to confirm position. Aspiration was confirmed to be negative for CSF and/or blood. A 1-2 ml. volume of Isovue-250 was injected and flow of contrast was noted at each level. Radiographs were obtained for documentation purposes.   After attaining the desired flow of contrast documented above, a 0.5 to 1.0 ml test dose of 0.25% Marcaine was injected into each respective transforaminal space.  The patient was observed for 90 seconds post injection.  After no sensory deficits were reported, and normal lower extremity motor function was noted,   the above injectate was administered so that equal amounts of the injectate were placed at each foramen (level) into the transforaminal epidural space.   Additional Comments:  The patient tolerated the procedure  well Dressing: Band-Aid with 2 x 2 sterile gauze    Post-procedure details: Patient was observed during the procedure. Post-procedure instructions were reviewed.  Patient left the clinic in stable condition.     Clinical History: 07/14/2019 2 view radiographs of the lumbar spine shows advanced degenerative disc  disease with a grade 1 spondylolisthesis at L5-S1 anterior chronic  compression at T12-L1 and L2 with joint space narrowing anterior bony  spurs and calcification of the aorta without aneurysm.     Objective:  VS:  HT:    WT:   BMI:     BP:120/77  HR:76bpm  TEMP: ( )  RESP:  Physical Exam Vitals and nursing note reviewed.  Constitutional:      General: He is not in acute distress.    Appearance: Normal appearance. He is not ill-appearing.  HENT:     Head: Normocephalic and atraumatic.     Right Ear: External ear normal.     Left Ear: External ear normal.     Nose: No congestion.  Eyes:     Extraocular Movements: Extraocular movements intact.  Cardiovascular:     Rate and Rhythm: Normal rate.     Pulses: Normal pulses.  Pulmonary:     Effort: Pulmonary effort is normal. No respiratory distress.  Abdominal:     General: There is no distension.     Palpations: Abdomen is soft.  Musculoskeletal:        General: No tenderness or signs of injury.     Cervical back: Neck supple.     Right lower leg: No edema.     Left lower leg: No edema.     Comments: Patient has good distal strength without clonus.  Skin:    Findings: No erythema or rash.  Neurological:     General: No focal deficit present.     Mental Status: He is alert and oriented to person, place, and time.     Sensory: No sensory deficit.     Motor: No weakness or abnormal muscle tone.     Coordination: Coordination normal.  Psychiatric:        Mood and Affect: Mood normal.        Behavior: Behavior normal.      Imaging: No results found.

## 2020-07-22 NOTE — Telephone Encounter (Signed)
Updated fax number. Please follow up.

## 2020-07-22 NOTE — Progress Notes (Signed)
Name: Jon Jones  MRN/ DOB: [id number], Dec 31, 1955   Age/ Sex: 65 y.o., male    PCP: Ladell Pier, MD   Reason for Endocrinology Evaluation: Type 2 Diabetes Mellitus     Date of Initial Endocrinology Visit: 07/23/2020     PATIENT IDENTIFIER: Jon Jones is a 65 y.o. male with a past medical history of T2DM, HTN, PAD, CAD, OSA on CPAP. The patient presented for initial endocrinology clinic visit on 07/23/2020 for consultative assistance with his diabetes management.    HPI: Jon Jones was    Diagnosed with DM years ago  Prior Medications tried/Intolerance: as listed   Currently checking blood sugars occasionally  Hypoglycemia episodes : no             Hemoglobin A1c has ranged from 7.3%  in 2019, peaking at 10.1% in 04/2020. Patient required assistance for hypoglycemia: no Patient has required hospitalization within the last 1 year from hyper or hypoglycemia: no   In terms of diet, the patient  eats 3 meals a day, snacks 1-2 a day, drinks occasional sugar-sweetened beverage   NO pancreatitis     HOME DIABETES REGIMEN: Jardiance 25 mg daily Glipizide 10 mg BID Metformin 1000 mg BID Januvia 50 mg  Statin: Yes ACE-I/ARB: Yes Prior Diabetic Education: yes    METER DOWNLOAD SUMMARY: Did not bring   DIABETIC COMPLICATIONS: Microvascular complications:   Microalbuminuria, neuropathy   Denies: CKD , retinopathy   Last eye exam: Completed  3 yrs ago   Macrovascular complications:   PVD, CAD  Denies: CVA   PAST HISTORY: Past Medical History:  Past Medical History:  Diagnosis Date  . 5 years ago   . AICD (automatic cardioverter/defibrillator) present   . Anxiety   . Arthritis   . CHF (congestive heart failure) ([place])   . Coronary artery disease   . Diabetes mellitus without complication ([place])   . Dysrhythmia   . GERD (gastroesophageal reflux disease)    PMH  . Hypertension   . Peripheral vascular disease ([place])   . Peripheral vascular disease  ([place])   . Pneumonia   . Shoulder impingement, right   . Sleep apnea    wears cpap  . Stomach ulcer   . Wears glasses    Past Surgical History:  Past Surgical History:  Procedure Laterality Date  . 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 CARDIAC CATHETERIZATION    . CARDIAC DEFIBRILLATOR PLACEMENT    . I & D EXTREMITY Left 07/19/2016   Procedure: IRRIGATION AND DEBRIDEMENT EXTREMITY/OLECRANON(WASHOUT);  Surgeon: Leandrew Koyanagi, MD;  Location: [place];  Service: Orthopedics;  Laterality: Left;  . OLECRANON BURSECTOMY Left 07/19/2016   Procedure: LEFT ELBOW OLECRANON BURSECTOMY;  Surgeon: Leandrew Koyanagi, MD;  Location: [place];  Service: Orthopedics;  Laterality: Left;  . SHOULDER ARTHROSCOPY Right 05/16/2019   Procedure: Right Shoulder Arthroscopy;  Surgeon: [name], MD;  Location: [place];  Service: Orthopedics;  Laterality: Right;      Social History:  reports that he quit smoking about 16 months ago. His smoking use included cigarettes. He has a 40.00 pack-year smoking history. He has never used smokeless tobacco. He reports previous alcohol use. He reports current drug use. Drug: Oxycodone. Family History:  Family History  Problem Relation Age of Onset  . Heart disease Mother   . Diabetes Mother   .  Peripheral vascular disease Mother      HOME MEDICATIONS: Allergies as of 07/23/2020   No Known Allergies     Medication List       Accurate as of Jul 23, 2020 10:06 AM. If you have any questions, ask your nurse or doctor.        Accu-Chek Guide Me w/Device Kit Check blood sugar once daily.   Accu-Chek Guide test strip Generic drug: glucose blood Check blood sugar once daily.   Accu-Chek Softclix Lancets lancets Check blood sugar once daily.   albuterol 108 (90 Base) MCG/ACT inhaler Commonly known as: ProAir HFA Inhale 2 puffs into  the lungs every 6 (six) hours as needed for wheezing or shortness of breath.   aspirin 81 MG EC tablet Take 81 mg by mouth daily.   atorvastatin 80 MG tablet Commonly known as: LIPITOR Take 80 mg by mouth every evening.   clobetasol ointment 0.05 % Commonly known as: TEMOVATE Apply 1 application topically 2 (two) times daily as needed.   clopidogrel 75 MG tablet Commonly known as: PLAVIX Take 75 mg by mouth daily.   empagliflozin 25 MG Tabs tablet Commonly known as: JARDIANCE Take 1 tablet (25 mg total) by mouth at bedtime.   gabapentin 300 MG capsule Commonly known as: NEURONTIN Take 1 capsule (300 mg total) by mouth at bedtime.   glipiZIDE 10 MG tablet Commonly known as: GLUCOTROL Take 2 tablets (20 mg total) by mouth 2 (two) times daily. What changed: how much to take   lisinopril 10 MG tablet Commonly known as: ZESTRIL Take 1 tablet (10 mg total) by mouth daily.   metFORMIN 1000 MG tablet Commonly known as: GLUCOPHAGE Take 1 tablet (1,000 mg total) by mouth 2 (two) times daily with a meal.   metoprolol succinate 100 MG 24 hr tablet Commonly known as: TOPROL-XL Take 100 mg by mouth daily.   Misc. Devices Misc Cpap supplies-tubing mask Dx G47.33 and Z99.89   nitroGLYCERIN 0.4 MG SL tablet Commonly known as: NITROSTAT Place 0.4 mg under the tongue See admin instructions. Every 5 minutes as needed for chest pain. Max of 3 doses   nystatin cream Commonly known as: MYCOSTATIN SMARTSIG:Sparingly Topical 2-3 Times Daily PRN   sitaGLIPtin 100 MG tablet Commonly known as: Januvia Take 1 tablet (100 mg total) by mouth daily. What changed:   medication strength  how much to take   spironolactone 25 MG tablet Commonly known as: ALDACTONE Take 12.5 mg by mouth daily.   triamcinolone cream 0.1 % Commonly known as: KENALOG SMARTSIG:Sparingly Topical Twice Daily        ALLERGIES: No Known Allergies   REVIEW OF SYSTEMS: A comprehensive ROS was  conducted with the patient and is negative except as per HPI    OBJECTIVE:   VITAL SIGNS: BP 124/78   Pulse 80   Ht 5' 5.5" (1.664 m)   Wt 234 lb 8 oz (106.4 kg)   SpO2 98%   BMI 38.43 kg/m    PHYSICAL EXAM:  General: Pt appears well and is in NAD  Neck: General: Supple without adenopathy or carotid bruits. Thyroid: Thyroid size normal.  No goiter or nodules appreciated.  Lungs: Clear with good BS bilat with no rales, rhonchi, or wheezes  Heart: RRR  Abdomen: Normoactive bowel sounds, soft, nontender, without masses or organomegaly palpable  Extremities:  Lower extremities - Trace pretibial edema.   Skin: Normal texture and temperature to palpation. No rash noted  Neuro: MS is good with  appropriate affect, pt is alert and Ox3      DATA REVIEWED:  Lab Jon  Component Value Date   HGBA1C 11.0 (A) 07/23/2020   HGBA1C 10.1 (A) 04/26/2020   HGBA1C 7.6 (H) 03/11/2019   Lab Jon  Component Value Date   LDLCALC 25 04/26/2020   CREATININE 1.19 04/26/2020   Lab Jon  Component Value Date   MICRALBCREAT 100 (H) 04/26/2020    Lab Jon  Component Value Date   CHOL 93 (L) 04/26/2020   HDL 25 (L) 04/26/2020   LDLCALC 25 04/26/2020   TRIG 289 (H) 04/26/2020   CHOLHDL 3.7 04/26/2020       Jon Jones, Jon Jones (MRN [id number]) as of 07/23/2020 12:22  Ref. Range 07/23/2020 10:22  TSH Latest Ref Range: 0.35 - 4.50 uIU/mL 1.88  T4,Free(Direct) Latest Ref Range: 0.60 - 1.60 ng/dL 0.79   ASSESSMENT / PLAN / RECOMMENDATIONS:   1) Type 2 Diabetes Mellitus, poorly controlled, With neuropathic, microalbuminuria, and macro vascular complications - Most recent A1c of 11.0 %. Goal A1c < 7.0 %.    Plan: GENERAL: I have discussed with the patient the pathophysiology of diabetes. We went over the natural progression of the disease. We talked about both insulin resistance and insulin deficiency. We stressed the importance of lifestyle changes including diet and exercise. I  explained the complications associated with diabetes including retinopathy, nephropathy, neuropathy as well as increased risk of cardiovascular disease. We went over the benefit seen with glycemic control.    I explained to the patient that diabetic patients are at higher than normal risk for amputations.  Discussed pharmacokinetics of basal/bolus insulin. I have recommended insulin today based on ADA recommendations with an A1c > 10.0% But he DECLINES. Pt understands the risk and benefits  I have recommended switching Januvia to GLP-1 agonists due to better benefits with the latter class, but he declines . I offered oral Rybelsus but he opted to  increase Januvia instead   We also discussed avoiding sugar-sweetened beverages and snacks, when possible.   Will make the following changes   I have encouraged daily glucose   He is unable to exercise   He will check with his insurance on [employer] coverage    MEDICATIONS: Continue Jardiance 25 mg, 1 tablet before breakfast  - Increase Glipizide 10 mg to TWO tablets before breakfast and TWO tablets before supper - Continue Metformin 1000 mg, 1 tablet before breakfast and 1 tablet before supper  - Increase Januvia to 100 mg, 1 tablet before breakfast   EDUCATION / INSTRUCTIONS:  BG monitoring instructions: Patient is instructed to check his blood sugars 1 times a day, fasting .  Call [facility] if: BG persistently < 70  . I reviewed the Rule of 15 for the treatment of hypoglycemia in detail with the patient. Literature supplied.   2) Diabetic complications:   Eye: Does not have known diabetic retinopathy.   Neuro/ Feet: Does have known diabetic peripheral neuropathy.  Renal: Patient does not have known baseline CKD. He is  on an ACEI/ARB at present.   3) Dyslipidemia/CAD/PVD: Patient is on a statin.  LDL at goal . Follows with cardiology    4) Weight gain :   - He would like to have TFT's checked as  his wife was having issues   -His thyroid function is normal today    F/U in 3 months   Signed electronically by: Mack Guise, MD  [facility]  [facility] [address], [place], [place] [address] Phone: [phone] FAX: [phone]   CC: Ladell Pier, MD [place] [address] Phone: [phone]  Fax: [phone]    Return to Endocrinology clinic as below: Future Appointments  Date Time Provider [place]  08/24/2020 10:30 AM Ladell Pier, MD [facility] None  08/30/2020 10:00 AM Donato Heinz, MD [facility]

## 2020-07-22 NOTE — Telephone Encounter (Signed)
Patient has made contact with a correct fax number for [facility] [phone]  Patient has requested that the request be resubmitted to the correct number when possible  Please contact to further advise if needed

## 2020-07-22 NOTE — Progress Notes (Signed)
Pt state lower back pain that travels down both legs. Pt state walking, standing and sitting makes the pain worse. Pt state he take over the counter pain meds to help ease his pain.  Numeric Pain Rating Scale and Functional Assessment Average Pain 3   In the last MONTH (on 0-10 scale) has pain interfered with the following?  1. General activity like being  able to carry out your everyday physical activities such as walking, climbing stairs, carrying groceries, or moving a chair?  Rating(10)   +Driver, -BT, -Dye Allergies.

## 2020-07-22 NOTE — Procedures (Signed)
S1 Lumbosacral Transforaminal Epidural Steroid Injection - Sub-Pedicular Approach with Fluoroscopic Guidance   [name]      Date of Birth: September 07, 1955 MRN: [id number] PCP: Marcine Matar, MD      Visit Date: 07/22/2020   Universal Protocol:    Date/Time: 05/12/223:05 PM  Consent Given By: the patient  Position:  PRONE  Additional Comments: Vital signs were monitored before and after the procedure. Patient was prepped and draped in the usual sterile fashion. The correct patient, procedure, and site was verified.   Injection Procedure Details:  Procedure Site One Meds Administered:  Meds ordered this encounter  Medications  . methylPREDNISolone acetate (DEPO-MEDROL) injection 80 mg    Laterality: Bilateral  Location/Site:  S1 Foramen   Needle size: 22 ga.  Needle type: Spinal  Needle Placement: Transforaminal  Findings:   -Comments: Excellent flow of contrast along the nerve, nerve root and into the epidural space.  Epidurogram: Contrast epidurogram showed no nerve root cut off or restricted flow pattern.  Procedure Details: After squaring off the sacral end-plate to get a true AP view, the C-arm was positioned so that the best possible view of the S1 foramen was visualized. The soft tissues overlying this structure were infiltrated with 2-3 ml. of 1% Lidocaine without Epinephrine.    The spinal needle was inserted toward the target using a "trajectory" view along the fluoroscope beam.  Under AP and lateral visualization, the needle was advanced so it did not puncture dura. Biplanar projections were used to confirm position. Aspiration was confirmed to be negative for CSF and/or blood. A 1-2 ml. volume of Isovue-250 was injected and flow of contrast was noted at each level. Radiographs were obtained for documentation purposes.   After attaining the desired flow of contrast documented above, a 0.5 to 1.0 ml test dose of 0.25% Marcaine was injected into  each respective transforaminal space.  The patient was observed for 90 seconds post injection.  After no sensory deficits were reported, and normal lower extremity motor function was noted,   the above injectate was administered so that equal amounts of the injectate were placed at each foramen (level) into the transforaminal epidural space.   Additional Comments:  The patient tolerated the procedure well Dressing: Band-Aid with 2 x 2 sterile gauze    Post-procedure details: Patient was observed during the procedure. Post-procedure instructions were reviewed.  Patient left the clinic in stable condition.

## 2020-07-22 NOTE — Patient Instructions (Signed)

## 2020-07-23 ENCOUNTER — Ambulatory Visit (INDEPENDENT_AMBULATORY_CARE_PROVIDER_SITE_OTHER): Payer: Medicaid Other | Admitting: Internal Medicine

## 2020-07-23 ENCOUNTER — Encounter: Payer: Self-pay | Admitting: Internal Medicine

## 2020-07-23 VITALS — BP 124/78 | HR 80 | Ht 65.5 in | Wt 234.5 lb

## 2020-07-23 DIAGNOSIS — R635 Abnormal weight gain: Secondary | ICD-10-CM

## 2020-07-23 DIAGNOSIS — E119 Type 2 diabetes mellitus without complications: Secondary | ICD-10-CM | POA: Insufficient documentation

## 2020-07-23 DIAGNOSIS — E1142 Type 2 diabetes mellitus with diabetic polyneuropathy: Secondary | ICD-10-CM | POA: Diagnosis not present

## 2020-07-23 DIAGNOSIS — E1159 Type 2 diabetes mellitus with other circulatory complications: Secondary | ICD-10-CM

## 2020-07-23 DIAGNOSIS — E1165 Type 2 diabetes mellitus with hyperglycemia: Secondary | ICD-10-CM

## 2020-07-23 LAB — T4, FREE: Free T4: 0.79 ng/dL (ref 0.60–1.60)

## 2020-07-23 LAB — POCT GLYCOSYLATED HEMOGLOBIN (HGB A1C): Hemoglobin A1C: 11 % — AB (ref 4.0–5.6)

## 2020-07-23 LAB — POCT GLUCOSE (DEVICE FOR HOME USE): POC Glucose: 419 mg/dl — AB (ref 70–99)

## 2020-07-23 LAB — TSH: TSH: 1.88 u[IU]/mL (ref 0.35–4.50)

## 2020-07-23 MED ORDER — METFORMIN HCL 1000 MG PO TABS: 1000.0000 mg | ORAL_TABLET | Freq: Two times a day (BID) | ORAL | 2 refills | Status: DC

## 2020-07-23 MED ORDER — ACCU-CHEK AVIVA PLUS VI STRP: 1.0000 | ORAL_STRIP | Freq: Every day | 3 refills | Status: DC

## 2020-07-23 MED ORDER — SITAGLIPTIN PHOSPHATE 100 MG PO TABS: 100.0000 mg | ORAL_TABLET | Freq: Every day | ORAL | 2 refills | Status: DC

## 2020-07-23 MED ORDER — EMPAGLIFLOZIN 25 MG PO TABS: 25.0000 mg | ORAL_TABLET | Freq: Every day | ORAL | 2 refills | Status: DC

## 2020-07-23 MED ORDER — GLIPIZIDE 10 MG PO TABS: 20.0000 mg | ORAL_TABLET | Freq: Two times a day (BID) | ORAL | 2 refills | Status: DC

## 2020-07-23 NOTE — Telephone Encounter (Signed)
Copied from CRM [id number]. Topic: General - Other >> Jul 23, 2020  1:34 PM Leafy Ro wrote: Reason for CRM: terri with [employer] is calling to notified the office she fax over CMN form for the patient to get his cpap supplies and please fax back [phone]   Form was received and placed in PCP box

## 2020-07-23 NOTE — Patient Instructions (Addendum)
-   Continue Jardiance 25 mg, 1 tablet before breakfast  - Increase Glipizide 10 mg to TWO tablets before breakfast and TWO tablets before supper - Continue Metformin 1000 mg, 1 tablet before breakfast and 1 tablet before supper  - Increase Januvia to 100 mg, 1 tablet before breakfast      HOW TO TREAT LOW BLOOD SUGARS (Blood sugar LESS THAN 70 MG/DL)  Please follow the RULE OF 15 for the treatment of hypoglycemia treatment (when your (blood sugars are less than 70 mg/dL)    STEP 1: Take 15 grams of carbohydrates when your blood sugar is low, which includes:   3-4 GLUCOSE TABS  OR  3-4 OZ OF JUICE OR REGULAR SODA OR  ONE TUBE OF GLUCOSE GEL     STEP 2: RECHECK blood sugar in 15 MINUTES STEP 3: If your blood sugar is still low at the 15 minute recheck --> then, go back to STEP 1 and treat AGAIN with another 15 grams of carbohydrates.

## 2020-07-26 NOTE — Telephone Encounter (Signed)
Form will be completed by provider and faxed back once done

## 2020-08-03 DIAGNOSIS — M4316 Spondylolisthesis, lumbar region: Secondary | ICD-10-CM | POA: Diagnosis not present

## 2020-08-03 DIAGNOSIS — M48062 Spinal stenosis, lumbar region with neurogenic claudication: Secondary | ICD-10-CM | POA: Diagnosis not present

## 2020-08-05 NOTE — Telephone Encounter (Signed)
Please advise 

## 2020-08-05 NOTE — Telephone Encounter (Signed)
Pt stated he is having a hard time getting supplies from [facility] and would like to change supply company/ pts insurance advised him to have them sent to [facility]/ Pt would like Cpap supplies order sent to [facility] / fax# [phone]/please advise   Pt asked if they can be resent today

## 2020-08-06 ENCOUNTER — Telehealth: Payer: Self-pay | Admitting: Physical Medicine and Rehabilitation

## 2020-08-06 NOTE — Telephone Encounter (Signed)
Jon Jones with [facility] is calling checking on the fax for cpap supplies. Please fax supporting documentation to  [phone]

## 2020-08-06 NOTE — Telephone Encounter (Signed)
Re faxed cpap order to [facility]

## 2020-08-10 ENCOUNTER — Other Ambulatory Visit: Payer: Self-pay

## 2020-08-10 ENCOUNTER — Ambulatory Visit (INDEPENDENT_AMBULATORY_CARE_PROVIDER_SITE_OTHER): Payer: Medicaid Other | Admitting: Family

## 2020-08-10 DIAGNOSIS — M25511 Pain in right shoulder: Secondary | ICD-10-CM | POA: Diagnosis not present

## 2020-08-10 DIAGNOSIS — M5416 Radiculopathy, lumbar region: Secondary | ICD-10-CM

## 2020-08-10 DIAGNOSIS — G8929 Other chronic pain: Secondary | ICD-10-CM

## 2020-08-11 DIAGNOSIS — R32 Unspecified urinary incontinence: Secondary | ICD-10-CM | POA: Diagnosis not present

## 2020-08-12 ENCOUNTER — Other Ambulatory Visit: Payer: Self-pay | Admitting: Internal Medicine

## 2020-08-14 ENCOUNTER — Other Ambulatory Visit: Payer: Self-pay

## 2020-08-14 DIAGNOSIS — I70213 Atherosclerosis of native arteries of extremities with intermittent claudication, bilateral legs: Secondary | ICD-10-CM

## 2020-08-17 DIAGNOSIS — G8929 Other chronic pain: Secondary | ICD-10-CM

## 2020-08-17 DIAGNOSIS — M5416 Radiculopathy, lumbar region: Secondary | ICD-10-CM | POA: Diagnosis not present

## 2020-08-17 DIAGNOSIS — M25511 Pain in right shoulder: Secondary | ICD-10-CM

## 2020-08-17 MED ORDER — LIDOCAINE HCL 1 % IJ SOLN
5.0000 mL | INTRAMUSCULAR | Status: AC | PRN
  Administered 2020-08-17: 5 mL

## 2020-08-17 MED ORDER — METHYLPREDNISOLONE ACETATE 40 MG/ML IJ SUSP
40.0000 mg | INTRAMUSCULAR | Status: AC | PRN
Start: 2020-08-17 — End: 2020-08-17
  Administered 2020-08-17: 40 mg via INTRA_ARTICULAR

## 2020-08-17 NOTE — Progress Notes (Signed)
Office Visit Note   [name]           Date of Birth: 07-Jul-1955           MRN: [id number] Visit Date: 08/10/2020              Requested by: Marcine Matar, MD [address] [place],  [place] [address] PCP: Marcine Matar, MD  Chief Complaint  Patient presents with  . Right Shoulder - Pain      HPI: 65 year old gentleman who is seen today for 2 separate issues.  He is complaining of right shoulder pain as well as some low back pain on the left.  History of chronic right shoulder pain.  He is following with Dr. August Saucer for his right shoulder he is planning to have surgery but currently is working on bringing down his A1c he has modified his diet and seen great improvement in his daily blood sugars for the last 4 weeks.  He would like to hold on rechecking his A1c for another 2 months.  Today he is requesting repeat Depo-Medrol injection of the right shoulder for symptom management while he waits for surgery.  Reports moderate relief of his pain with Depo-Medrol injections in the past  He is also having return of his chronic radicular pain with some low back pain that radiates down his left hip and thigh.  This is worse with standing and walking worse with prolonged sitting.  No weakness no change in bowel or bladder.  No new injury.  Requesting repeat ESI.  He has had good relief with ESI with Dr. Alvester Morin in the past.  Assessment & Plan: Visit Diagnoses:  1. Lumbar back pain with radiculopathy affecting left lower extremity     Plan: Depo-Medrol injection of his right shoulder today.  Referred to Dr. Alvester Morin for ESI.  He will follow with Dr. August Saucer for surgical management of the right shoulder.  Follow-Up Instructions: Return in about 3 weeks (around 08/31/2020), or if symptoms worsen or fail to improve.   Back Exam   Tenderness  The patient is experiencing no tenderness.   Muscle Strength  The patient has normal back strength.  Tests  Straight leg raise  left: positive   Right Hand Exam   Muscle Strength  Grip: 5/5    Left Hand Exam   Muscle Strength  Grip:  5/5    Right Shoulder Exam   Tests  Impingement: positive  Other  Erythema: absent Pulse: present      Patient is alert, oriented, no adenopathy, well-dressed, normal affect, normal respiratory effort.   Imaging: No results found. No images are attached to the encounter.  Labs: Lab Results  Component Value Date   HGBA1C 11.0 (A) 07/23/2020   HGBA1C 10.1 (A) 04/26/2020   HGBA1C 7.6 (H) 03/11/2019   ESRSEDRATE 11 03/11/2019   CRP 1.8 (H) 03/11/2019   REPTSTATUS 07/24/2016 FINAL 07/19/2016   GRAMSTAIN  07/19/2016    RARE WBC PRESENT, PREDOMINANTLY MONONUCLEAR NO ORGANISMS SEEN    CULT NO GROWTH 5 DAYS 07/19/2016     Lab Results  Component Value Date   ALBUMIN 4.2 04/26/2020   ALBUMIN 3.7 03/11/2019    No results found for: MG No results found for: VD25OH  No results found for: PREALBUMIN CBC EXTENDED Latest Ref Rng & Units 04/26/2020 05/16/2019 03/13/2019  WBC 3.4 - 10.8 x10E3/uL 7.4 - 9.5  RBC 4.14 - 5.80 x10E6/uL 4.81 - 4.43  HGB 13.0 - 17.7  g/dL 78.2 42.3 53.6  HCT 14.4 - 51.0 % 44.8 43.0 42.4  PLT 150 - 450 x10E3/uL 198 - 197  NEUTROABS 1.7 - 7.7 K/uL - - -  LYMPHSABS 0.7 - 4.0 K/uL - - -     There is no height or weight on file to calculate BMI.  Orders:  Orders Placed This Encounter  Procedures  . Ambulatory referral to Physical Medicine Rehab  . Ambulatory referral to Spine Surgery   No orders of the defined types were placed in this encounter.    Procedures: Large Joint Inj: R subacromial bursa on 08/17/2020 9:44 AM Indications: pain Details: 22 G 1.5 in needle Medications: 5 mL lidocaine 1 %; 40 mg methylPREDNISolone acetate 40 MG/ML Consent was given by the patient.      Clinical Data: No additional findings.  ROS:  All other systems negative, except as noted in the HPI. Review of Systems  Constitutional:  Negative for chills and fever.  Musculoskeletal: Positive for arthralgias, back pain and myalgias.  Neurological: Positive for numbness. Negative for weakness.    Objective: Vital Signs: There were no vitals taken for this visit.  Specialty Comments:  No specialty comments available.  PMFS History: Patient Active Problem List   Diagnosis Date Noted  . Diabetes mellitus ([name]) 07/23/2020  . Type 2 diabetes mellitus with hyperglycemia, without long-term current use of insulin ([name]) 07/23/2020  . Weight gain 07/23/2020  . Type 2 diabetes mellitus with peripheral neuropathy ([name]) 07/23/2020  . 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 diabetes mellitus ([name]) 05/04/2020  . Hypertensive heart disease with heart failure ([name]) 04/26/2020  . Type 2 diabetes mellitus with diabetic peripheral angiopathy without gangrene, without long-term current use of insulin ([name]) 04/26/2020  . OSA on CPAP 04/26/2020  . PAD (peripheral artery disease) ([name]) 04/26/2020  . CAD in native artery 04/26/2020  . Class 2 severe obesity due to excess calories with serious comorbidity and body mass index (BMI) of 38.0 to 38.9 in adult ([name]) 04/26/2020  . Impingement syndrome of right shoulder   . Nontraumatic complete tear of right rotator cuff   . Type 2 diabetes mellitus with foot ulcer, without long-term current use of insulin ([name])   . 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 S/P debridement 07/19/2016  . Acute gout of left elbow 07/19/2016   Past Medical History:  Diagnosis Date  . 5 years ago   . AICD (automatic cardioverter/defibrillator) present   . Anxiety   . Arthritis   . CHF (congestive heart failure) ([name])   . Coronary artery disease   . Diabetes mellitus without complication ([name])   . Dysrhythmia   . GERD (gastroesophageal reflux disease)    PMH  . Hypertension   . Peripheral vascular disease ([name])   . Peripheral vascular disease ([name])   . Pneumonia   . Shoulder impingement,  right   . Sleep apnea    wears cpap  . Stomach ulcer   . Wears glasses     Family History  Problem Relation Age of Onset  . Heart disease Mother   . Diabetes Mother   . Peripheral vascular disease Mother     Past Surgical History:  Procedure Laterality Date  . 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 CARDIAC CATHETERIZATION    . CARDIAC DEFIBRILLATOR PLACEMENT    . I & D  EXTREMITY Left 07/19/2016   Procedure: IRRIGATION AND DEBRIDEMENT EXTREMITY/OLECRANON(WASHOUT);  Surgeon: Tarry Kos, MD;  Location: [facility] OR;  Service: Orthopedics;  Laterality: Left;  . OLECRANON BURSECTOMY Left 07/19/2016   Procedure: LEFT ELBOW OLECRANON BURSECTOMY;  Surgeon: Tarry Kos, MD;  Location: [facility];  Service: Orthopedics;  Laterality: Left;  . SHOULDER ARTHROSCOPY Right 05/16/2019   Procedure: Right Shoulder Arthroscopy;  Surgeon: [name], MD;  Location: [facility] OR;  Service: Orthopedics;  Laterality: Right;   Social History   Occupational History  . Not on file  Tobacco Use  . Smoking status: Former Smoker    Packs/day: 1.00    Years: 40.00    Pack years: 40.00    Types: Cigarettes    Quit date: 03/11/2019    Years since quitting: 1.4  . Smokeless tobacco: Never Used  Vaping Use  . Vaping Use: Never used  Substance and Sexual Activity  . Alcohol use: Not Currently  . Drug use: Yes    Types: Oxycodone  . Sexual activity: Not on file

## 2020-08-19 ENCOUNTER — Encounter: Payer: Self-pay | Admitting: Physical Medicine and Rehabilitation

## 2020-08-19 ENCOUNTER — Other Ambulatory Visit: Payer: Self-pay

## 2020-08-19 ENCOUNTER — Ambulatory Visit (INDEPENDENT_AMBULATORY_CARE_PROVIDER_SITE_OTHER): Payer: Medicaid Other | Admitting: Physical Medicine and Rehabilitation

## 2020-08-19 ENCOUNTER — Ambulatory Visit: Payer: Self-pay

## 2020-08-19 VITALS — BP 120/84 | HR 74

## 2020-08-19 DIAGNOSIS — M5416 Radiculopathy, lumbar region: Secondary | ICD-10-CM | POA: Diagnosis not present

## 2020-08-19 DIAGNOSIS — M48062 Spinal stenosis, lumbar region with neurogenic claudication: Secondary | ICD-10-CM | POA: Diagnosis not present

## 2020-08-19 DIAGNOSIS — M4317 Spondylolisthesis, lumbosacral region: Secondary | ICD-10-CM | POA: Diagnosis not present

## 2020-08-19 DIAGNOSIS — G4733 Obstructive sleep apnea (adult) (pediatric): Secondary | ICD-10-CM | POA: Diagnosis not present

## 2020-08-19 MED ORDER — METHYLPREDNISOLONE ACETATE 80 MG/ML IJ SUSP
80.0000 mg | Freq: Once | INTRAMUSCULAR | Status: AC
  Administered 2020-08-19: 80 mg

## 2020-08-19 NOTE — Progress Notes (Signed)
Pt state lower back pain that travels on his right side. Pt state weather and going up and down stairs makes the pan worse. Pt state he takes pain meds to help ease his pain. Pt has hx of inj on 07/22/20 pt state it helped is left side.  Numeric Pain Rating Scale and Functional Assessment Average Pain 5     In the last MONTH (on 0-10 scale) has pain interfered with the following?  1. General activity like being  able to carry out your everyday physical activities such as walking, climbing stairs, carrying groceries, or moving a chair?  Rating(8)   +Driver, -BT, -Dye Allergies.

## 2020-08-19 NOTE — Progress Notes (Signed)
Jon Jones - 65 y.o. male MRN [id number]  Date of birth: [birth year]  Office Visit Note: Visit Date: 08/19/2020 PCP: Marcine Matar, MD Referred by: Marcine Matar, MD  Subjective: Chief Complaint  Patient presents with   Lower Back - Pain   HPI:  Jon Jones is a 65 y.o. male who comes in today at the request of Dr. Aldean Baker for planned Right L4-L5 and S1 Lumbar Transforaminal epidural steroid injection with fluoroscopic guidance.  The patient has failed conservative care including home exercise, medications, time and activity modification.  This injection will be diagnostic and hopefully therapeutic.  Please see requesting physician notes for further details and justification.  Prior S1 transforaminal injections actually improved his left side greatly but not his right side.  He has pretty significant stenosis at L5-S1 with small listhesis but also with narrowing at L4-5.  He has seen Dr. Wynetta Emery since I have last seen him but I do not have the notes from Dr. Wynetta Emery in the office.  Per the patient, Dr. Wynetta Emery suggested lumbar two-level decompression with fusion because of listhesis.  Patient is not sure he really wants to undergo surgery like that and ask for another opinion.  It appears that Jon Del, FNP whom the patient follows with through Dr. Lajoyce Corners has already placed a referral for spine surgery consultation.  Typically the neurosurgeons in town will not see another patient in their practice.  I would suggest referral for Dr. Vira Browns.  I had a discussion with the patient today about orthopedic spine surgery versus neurosurgery.  We will see if we can make that happen.  We will also see how the injection does today.  His pain is quite severe.   ROS Otherwise per HPI.    Assessment & Plan: Visit Diagnoses:    ICD-10-CM   1. Lumbar radiculopathy  M54.16 XR C-ARM NO REPORT    Epidural Steroid injection    methylPREDNISolone acetate (DEPO-MEDROL) injection 80 mg    2. Spinal  stenosis of lumbar region with neurogenic claudication  M48.062 XR C-ARM NO REPORT    Epidural Steroid injection    methylPREDNISolone acetate (DEPO-MEDROL) injection 80 mg    3. Spondylolisthesis of lumbosacral region  M43.17 XR C-ARM NO REPORT    Epidural Steroid injection    methylPREDNISolone acetate (DEPO-MEDROL) injection 80 mg      Plan: No additional findings.   Meds & Orders:  Meds ordered this encounter  Medications   methylPREDNISolone acetate (DEPO-MEDROL) injection 80 mg    Orders Placed This Encounter  Procedures   XR C-ARM NO REPORT   Epidural Steroid injection    Follow-up: No follow-ups on file.   Procedures: No procedures performed  Lumbosacral Transforaminal Epidural Steroid Injection - Sub-Pedicular Approach with Fluoroscopic Guidance  [name]      Date of Birth: [birth year] MRN: [id number] PCP: Marcine Matar, MD      Visit Date: 08/19/2020   Universal Protocol:    Date/Time: 08/19/2020  Consent Given By: the patient  Position: PRONE  Additional Comments: Vital signs were monitored before and after the procedure. Patient was prepped and draped in the usual sterile fashion. The correct patient, procedure, and site was verified.   Injection Procedure Details:   Procedure diagnoses: Lumbar radiculopathy [M54.16]    Meds Administered:  Meds ordered this encounter  Medications   methylPREDNISolone acetate (DEPO-MEDROL) injection 80 mg    Laterality: Right  Location/Site:  L4-L5 and S1  Needle:5.0 in.,  22 ga.  Short bevel or Quincke spinal needle  Needle Placement: Transforaminal  Findings:    -Comments: Excellent flow of contrast along the nerve, nerve root and into the epidural space.  Procedure Details: After squaring off the end-plates to get a true AP view, the C-arm was positioned so that an oblique view of the foramen as noted above was visualized. The target area is just inferior to the "nose of the scotty dog"  or sub pedicular. The soft tissues overlying this structure were infiltrated with 2-3 ml. of 1% Lidocaine without Epinephrine.  The spinal needle was inserted toward the target using a "trajectory" view along the fluoroscope beam.  Under AP and lateral visualization, the needle was advanced so it did not puncture dura and was located close the 6 O'Clock position of the pedical in AP tracterory. Biplanar projections were used to confirm position. Aspiration was confirmed to be negative for CSF and/or blood. A 1-2 ml. volume of Isovue-250 was injected and flow of contrast was noted at each level. Radiographs were obtained for documentation purposes.   After attaining the desired flow of contrast documented above, a 0.5 to 1.0 ml test dose of 0.25% Marcaine was injected into each respective transforaminal space.  The patient was observed for 90 seconds post injection.  After no sensory deficits were reported, and normal lower extremity motor function was noted,   the above injectate was administered so that equal amounts of the injectate were placed at each foramen (level) into the transforaminal epidural space.   Additional Comments:  The patient tolerated the procedure well Dressing: 2 x 2 sterile gauze and Band-Aid    Post-procedure details: Patient was observed during the procedure. Post-procedure instructions were reviewed.  Patient left the clinic in stable condition.   Clinical History: Ct Myelogram IMPRESSION: 1. L5-S1: Severe multifactorial spinal stenosis. Advanced bilateral facet arthropathy with 5 mm of anterolisthesis which increases to 8 mm with standing and flexion. Bulging of the disc. Complex synovial cyst arising from the facet joint on the left within the posterior left side of the spinal canal. Marked constriction of the thecal sac due to these processes in combination with abundant epidural fat. Severe bilateral foraminal stenosis could compress either or both exiting L5  nerves. 2. L4-5: Moderate multifactorial spinal stenosis. 3. T12-L1: Right foraminal encroachment by osteophyte could possibly affect the right T12 nerve.   Aortic Atherosclerosis (ICD10-I70.0).     Electronically Signed   By: Paulina Fusi M.D.   On: 06/29/2020 12:39     Objective:  VS:  HT:    WT:   BMI:     BP:120/84  HR:74bpm  TEMP: ( )  RESP:  Physical Exam Vitals and nursing note reviewed.  Constitutional:      General: He is not in acute distress.    Appearance: Normal appearance. He is obese. He is not ill-appearing.  HENT:     Head: Normocephalic and atraumatic.     Right Ear: External ear normal.     Left Ear: External ear normal.     Nose: No congestion.  Eyes:     Extraocular Movements: Extraocular movements intact.  Cardiovascular:     Rate and Rhythm: Normal rate.     Pulses: Normal pulses.  Pulmonary:     Effort: Pulmonary effort is normal. No respiratory distress.  Abdominal:     General: There is no distension.     Palpations: Abdomen is soft.  Musculoskeletal:  General: No tenderness or signs of injury.     Cervical back: Neck supple.     Right lower leg: No edema.     Left lower leg: No edema.     Comments: Patient has good distal strength without clonus.  Skin:    Findings: No erythema or rash.  Neurological:     General: No focal deficit present.     Mental Status: He is alert and oriented to person, place, and time.     Sensory: No sensory deficit.     Motor: No weakness or abnormal muscle tone.     Coordination: Coordination normal.  Psychiatric:        Mood and Affect: Mood normal.        Behavior: Behavior normal.     Imaging: XR C-ARM NO REPORT  Result Date: 08/19/2020 Please see Notes tab for imaging impression.

## 2020-08-19 NOTE — Patient Instructions (Signed)

## 2020-08-20 ENCOUNTER — Other Ambulatory Visit: Payer: Self-pay | Admitting: Internal Medicine

## 2020-08-20 NOTE — Procedures (Signed)
Lumbosacral Transforaminal Epidural Steroid Injection - Sub-Pedicular Approach with Fluoroscopic Guidance  [name]      Date of Birth: 1955/04/10 MRN: [id number] PCP: Marcine Matar, MD      Visit Date: 08/19/2020   Universal Protocol:    Date/Time: 08/19/2020  Consent Given By: the patient  Position: PRONE  Additional Comments: Vital signs were monitored before and after the procedure. Patient was prepped and draped in the usual sterile fashion. The correct patient, procedure, and site was verified.   Injection Procedure Details:   Procedure diagnoses: Lumbar radiculopathy [M54.16]    Meds Administered:  Meds ordered this encounter  Medications   methylPREDNISolone acetate (DEPO-MEDROL) injection 80 mg    Laterality: Right  Location/Site:  L4-L5 and S1  Needle:5.0 in., 22 ga.  Short bevel or Quincke spinal needle  Needle Placement: Transforaminal  Findings:    -Comments: Excellent flow of contrast along the nerve, nerve root and into the epidural space.  Procedure Details: After squaring off the end-plates to get a true AP view, the C-arm was positioned so that an oblique view of the foramen as noted above was visualized. The target area is just inferior to the "nose of the scotty dog" or sub pedicular. The soft tissues overlying this structure were infiltrated with 2-3 ml. of 1% Lidocaine without Epinephrine.  The spinal needle was inserted toward the target using a "trajectory" view along the fluoroscope beam.  Under AP and lateral visualization, the needle was advanced so it did not puncture dura and was located close the 6 O'Clock position of the pedical in AP tracterory. Biplanar projections were used to confirm position. Aspiration was confirmed to be negative for CSF and/or blood. A 1-2 ml. volume of Isovue-250 was injected and flow of contrast was noted at each level. Radiographs were obtained for documentation purposes.   After attaining the  desired flow of contrast documented above, a 0.5 to 1.0 ml test dose of 0.25% Marcaine was injected into each respective transforaminal space.  The patient was observed for 90 seconds post injection.  After no sensory deficits were reported, and normal lower extremity motor function was noted,   the above injectate was administered so that equal amounts of the injectate were placed at each foramen (level) into the transforaminal epidural space.   Additional Comments:  The patient tolerated the procedure well Dressing: 2 x 2 sterile gauze and Band-Aid    Post-procedure details: Patient was observed during the procedure. Post-procedure instructions were reviewed.  Patient left the clinic in stable condition.

## 2020-08-24 ENCOUNTER — Ambulatory Visit: Payer: Medicaid Other | Attending: Internal Medicine | Admitting: Internal Medicine

## 2020-08-24 ENCOUNTER — Other Ambulatory Visit: Payer: Self-pay

## 2020-08-24 ENCOUNTER — Encounter: Payer: Self-pay | Admitting: Internal Medicine

## 2020-08-24 VITALS — BP 107/72 | HR 69 | Resp 16 | Wt 232.6 lb

## 2020-08-24 DIAGNOSIS — S98132A Complete traumatic amputation of one left lesser toe, initial encounter: Secondary | ICD-10-CM | POA: Diagnosis not present

## 2020-08-24 DIAGNOSIS — E1142 Type 2 diabetes mellitus with diabetic polyneuropathy: Secondary | ICD-10-CM | POA: Diagnosis not present

## 2020-08-24 DIAGNOSIS — Z79899 Other long term (current) drug therapy: Secondary | ICD-10-CM | POA: Insufficient documentation

## 2020-08-24 DIAGNOSIS — Z87891 Personal history of nicotine dependence: Secondary | ICD-10-CM | POA: Insufficient documentation

## 2020-08-24 DIAGNOSIS — I2510 Atherosclerotic heart disease of native coronary artery without angina pectoris: Secondary | ICD-10-CM | POA: Diagnosis not present

## 2020-08-24 DIAGNOSIS — Z7984 Long term (current) use of oral hypoglycemic drugs: Secondary | ICD-10-CM | POA: Diagnosis not present

## 2020-08-24 DIAGNOSIS — E1151 Type 2 diabetes mellitus with diabetic peripheral angiopathy without gangrene: Secondary | ICD-10-CM | POA: Insufficient documentation

## 2020-08-24 DIAGNOSIS — Z7902 Long term (current) use of antithrombotics/antiplatelets: Secondary | ICD-10-CM | POA: Insufficient documentation

## 2020-08-24 DIAGNOSIS — Z7982 Long term (current) use of aspirin: Secondary | ICD-10-CM | POA: Diagnosis not present

## 2020-08-24 DIAGNOSIS — I5022 Chronic systolic (congestive) heart failure: Secondary | ICD-10-CM

## 2020-08-24 DIAGNOSIS — B351 Tinea unguium: Secondary | ICD-10-CM | POA: Diagnosis not present

## 2020-08-24 DIAGNOSIS — G4733 Obstructive sleep apnea (adult) (pediatric): Secondary | ICD-10-CM

## 2020-08-24 DIAGNOSIS — Z833 Family history of diabetes mellitus: Secondary | ICD-10-CM | POA: Diagnosis not present

## 2020-08-24 DIAGNOSIS — Z95810 Presence of automatic (implantable) cardiac defibrillator: Secondary | ICD-10-CM | POA: Diagnosis not present

## 2020-08-24 DIAGNOSIS — Z9989 Dependence on other enabling machines and devices: Secondary | ICD-10-CM | POA: Diagnosis not present

## 2020-08-24 DIAGNOSIS — Z6838 Body mass index (BMI) 38.0-38.9, adult: Secondary | ICD-10-CM | POA: Diagnosis not present

## 2020-08-24 DIAGNOSIS — Z89422 Acquired absence of other left toe(s): Secondary | ICD-10-CM | POA: Insufficient documentation

## 2020-08-24 DIAGNOSIS — Z23 Encounter for immunization: Secondary | ICD-10-CM | POA: Diagnosis not present

## 2020-08-24 DIAGNOSIS — Z8249 Family history of ischemic heart disease and other diseases of the circulatory system: Secondary | ICD-10-CM | POA: Insufficient documentation

## 2020-08-24 MED ORDER — ACCU-CHEK AVIVA PLUS VI STRP: ORAL_STRIP | 3 refills | Status: DC

## 2020-08-24 MED ORDER — CICLOPIROX 8 % EX SOLN: Freq: Every day | CUTANEOUS | 3 refills | Status: DC

## 2020-08-24 NOTE — Progress Notes (Signed)
Patient ID: Jon Jones, male    DOB: [birth year]  MRN: [id number]  CC: Chronic disease management  Subjective: Jon Jones is a 66 y.o. 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 NErnestina Patchesand had epidural injections to the lumbar spine. He has also been seeing Dr. DMarlou Safor his right shoulder.  He has osteoarthritis and rotator cuff injury.  He would like to do arthroscopic surgery but A1c has to be less than 8 to proceed.  DM/Obesity: Saw the endocrinologist Dr. SKelton Pillar5/13/2022.  A1c was worse compared to when he saw me in February increased from 10 to 11. Endocrinologist recommended insulin therapy or GLP-1 agent.  Patient declined.  Oral medications were adjusted instead -today, he reports he took one of his wife's Janumet and felt BS responded better than to him taking Metformin and Januvia separately.  Compliant with oral medications including metformin, Januvia, glipizide, Jardiance Checks BS once a day sometimes in afternoon and other  times at nights.  would like to check TID but states rxn for stripes written for hm to check only once a day Has log BS book.  Range 153-240.  Most in the 100s. Doing better with eating habits. Did see nutritionist and felt that the portion sizes she was proposing would not be adequate for him. In process of purchasing bicycle.  Currently uses ellipitical 15 mins daily Appt for eye exam coming up this wk -Has discoloration of his toenails.  Thinks it is fungus.  Would like to be treated.  CAD/CHF: Had echo done in March that revealed EF of 35 to 40%.  Saw Dr. CSallyanne Kuster5/9/22.  He said to [facility]  [facility] -Today patient denies any shortness of breath at rest or on exertion.  No PND orthopnea.  No lower extremity edema.  He has not had any chest pains.  Reports compliance with medications including aspirin, statin, lisinopril, metoprolol, spironolactone  OSA: Uses CPAP consistently and feels that he benefits from using it.  Decrease daytime sleepiness. Patient Active Problem List   Diagnosis Date Noted   Diabetes mellitus ([facility] 07/23/2020   Type 2 diabetes mellitus with hyperglycemia, without long-term current use of insulin ([facility] 07/23/2020   Weight gain 07/23/2020   Type 2 diabetes mellitus with peripheral neuropathy ([facility] 07/23/2020   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 diabetes mellitus ([facility] 05/04/2020   Hypertensive heart disease with heart failure ([facility] 04/26/2020   Type 2 diabetes mellitus with diabetic peripheral angiopathy without gangrene, without long-term current use of insulin ([facility] 04/26/2020   OSA on CPAP 04/26/2020   PAD (peripheral artery disease) ([facility] 04/26/2020   CAD in native artery 04/26/2020   Class 2 severe obesity due to excess calories with serious comorbidity and body mass index (BMI) of 38.0 to 38.9 in adult ([facility] 04/26/2020   Impingement syndrome of right shoulder    Nontraumatic complete tear of right rotator cuff    Type 2 diabetes mellitus with foot ulcer, without long-term current use of insulin ([name])    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     atorvastatin (LIPITOR) 80 MG tablet Take 80 mg by mouth every evening.     Blood Glucose Monitoring Suppl (ACCU-CHEK GUIDE ME) w/Device KIT Check blood sugar once  daily. 1 kit 0   clobetasol ointment (TEMOVATE) 6.01 % Apply 1 application topically 2 (two) times daily as needed. 30 g 0   clopidogrel (PLAVIX) 75 MG tablet Take 75 mg by mouth daily.     empagliflozin (JARDIANCE) 25 MG TABS tablet Take 1 tablet (25 mg total) by mouth at bedtime. 90 tablet 2   gabapentin (NEURONTIN) 300 MG capsule Take 1 capsule (300 mg total) by mouth at bedtime. 30 capsule 5   glipiZIDE (GLUCOTROL) 10 MG tablet Take 2 tablets (20 mg total) by mouth 2 (two) times daily. 360 tablet 2   lisinopril (ZESTRIL) 10 MG tablet Take 1 tablet (10 mg total) by mouth daily. 30 tablet 0   metFORMIN (GLUCOPHAGE) 1000 MG tablet Take 1 tablet (1,000 mg total) by mouth 2 (two) times daily with a meal. 180 tablet 2   metoprolol succinate (TOPROL-XL) 100 MG 24 hr tablet Take 100 mg by mouth daily.      Misc. Devices MISC Cpap supplies-tubing mask Dx G47.33 and Z99.89 1 Device 0   nitroGLYCERIN (NITROSTAT) 0.4 MG SL tablet Place 0.4 mg under the tongue See admin instructions. Every 5 minutes as needed for chest pain. Max of 3 doses     nystatin cream (MYCOSTATIN) SMARTSIG:Sparingly Topical 2-3 Times Daily PRN     PROAIR HFA 108 (90 Base) MCG/ACT inhaler INHALE 2 PUFFS INTO THE LUNGS EVERY 6 HOURS AS NEEDED FOR WHEEZING OR SHORTNESS OF BREATH 8.5 g 0   sitaGLIPtin (JANUVIA) 100 MG tablet Take 1 tablet (100 mg total) by mouth daily. 90 tablet 2   spironolactone (ALDACTONE) 25 MG tablet Take 12.5 mg by mouth daily.     triamcinolone (KENALOG) 0.1 % SMARTSIG:Sparingly Topical Twice Daily     No current facility-administered medications on file prior to visit.    No Known Allergies  Social History   Socioeconomic History   Marital status: Married    Spouse name: Not on file   Number of children: Not on file   Years of education: Not on file   Highest education level: Not on file  Occupational History   Not on file  Tobacco Use   Smoking status: Former    Packs/day: 1.00    Years: 40.00     Pack years: 40.00    Types: Cigarettes    Quit date: 03/11/2019    Years since quitting: 1.4   Smokeless tobacco: Never  Vaping Use   Vaping Use: Never used  Substance and Sexual Activity   Alcohol use: Not Currently   Drug use: Yes    Types: Oxycodone   Sexual activity: Not on file  Other Topics Concern   Not on file  Social History Narrative   Not on file   Social Determinants of Health   Financial Resource Strain: Not on file  Food Insecurity: Not on file  Transportation Needs: Not on file  Physical Activity: Not on file  Stress: Not on file  Social Connections: Not on file  Intimate Partner Violence: Not on  file    Family History  Problem Relation Age of Onset   Heart disease Mother    Diabetes Mother    Peripheral vascular disease Mother     Past Surgical History:  Procedure Laterality Date   AMPUTATION Left 03/12/2019   Procedure: 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;  Surgeon: [name], MD;  Location: [place];  Service: Orthopedics;  Laterality: Left;   CARDIAC CATHETERIZATION     CARDIAC DEFIBRILLATOR PLACEMENT     I & D EXTREMITY Left 07/19/2016   Procedure: IRRIGATION AND DEBRIDEMENT EXTREMITY/OLECRANON(WASHOUT);  Surgeon: Leandrew Koyanagi, MD;  Location: [place];  Service: Orthopedics;  Laterality: Left;   OLECRANON BURSECTOMY Left 07/19/2016   Procedure: LEFT ELBOW OLECRANON BURSECTOMY;  Surgeon: Leandrew Koyanagi, MD;  Location: [place];  Service: Orthopedics;  Laterality: Left;   SHOULDER ARTHROSCOPY Right 05/16/2019   Procedure: Right Shoulder Arthroscopy;  Surgeon: [name], MD;  Location: [place];  Service: Orthopedics;  Laterality: Right;    ROS: Review of Systems Negative except as stated above  PHYSICAL EXAM: BP 107/72   Pulse 69   Resp 16   Wt 232 lb 9.6 oz (105.5 kg)   SpO2 96%   BMI 38.12 kg/m   Wt Readings from Last 3 Encounters:  08/24/20 232 lb 9.6 oz (105.5 kg)  07/23/20 234 lb 8 oz (106.4 kg)  07/19/20 235 lb 12.8 oz (107  kg)    Physical Exam   General appearance - alert, well appearing, obese older male and in no distress Mental status - normal mood, behavior, speech, dress, motor activity, and thought processes Chest - clear to auscultation, no wheezes, rales or rhonchi, symmetric air entry Heart - normal rate, regular rhythm, normal S1, S2, no murmurs, rubs, clicks or gallops Extremities - peripheral pulses normal, no pedal edema, no clubbing or cyanosis Diabetic Foot Exam - Simple   Simple Foot Form Visual Inspection See comments: Yes Sensation Testing See comments: Yes Pulse Check Posterior Tibialis and Dorsalis pulse intact bilaterally: Yes Comments He has had amputation of the left fifth toe.  Decreased sensation on the plantar surface of the feet on leap exam.  Toenails are thick and discolored.    CMP Latest Ref Rng & Units 04/26/2020 05/16/2019 03/13/2019  Glucose 65 - 99 mg/dL 327(H) 241(H) 119(H)  BUN 8 - 27 mg/dL 19 26(H) 19  Creatinine 0.76 - 1.27 mg/dL 1.19 1.00 1.01  Sodium 134 - 144 mmol/L 138 138 138  Potassium 3.5 - 5.2 mmol/L 4.8 4.3 4.2  Chloride 96 - 106 mmol/L 101 105 104  CO2 20 - 29 mmol/L 19(L) - 23  Calcium 8.6 - 10.2 mg/dL 9.6 - 9.0  Total Protein 6.0 - 8.5 g/dL 7.2 - -  Total Bilirubin 0.0 - 1.2 mg/dL 0.5 - -  Alkaline Phos 44 - 121 IU/L 75 - -  AST 0 - 40 IU/L 21 - -  ALT 0 - 44 IU/L 26 - -   Lipid Panel     Component Value Date/Time   CHOL 93 (L) 04/26/2020 1002   TRIG 289 (H) 04/26/2020 1002   HDL 25 (L) 04/26/2020 1002   CHOLHDL 3.7 04/26/2020 1002   LDLCALC 25 04/26/2020 1002    CBC    Component Value Date/Time   WBC 7.4 04/26/2020 1002   WBC 9.5 03/13/2019 0320   RBC 4.81 04/26/2020 1002   RBC 4.43 03/13/2019 0320   HGB 14.1 04/26/2020 1002   HCT 44.8 04/26/2020  1002   PLT 198 04/26/2020 1002   MCV 93 04/26/2020 1002   MCH 29.3 04/26/2020 1002   MCH 30.9 03/13/2019 0320   MCHC 31.5 04/26/2020 1002   MCHC 32.3 03/13/2019 0320   RDW 12.7  04/26/2020 1002   LYMPHSABS 2.7 03/11/2019 0740   MONOABS 0.7 03/11/2019 0740   EOSABS 0.1 03/11/2019 0740   BASOSABS 0.1 03/11/2019 0740    ASSESSMENT AND PLAN: 1. Type 2 diabetes mellitus with peripheral neuropathy ([name]) Blood sugars have shown some improvement.  I still think he would do better with adding insulin or Trulicity which he does not wish to do. -I told him that I do not think it would make any difference in his blood sugars if he was taking the metformin and Januvia as separate pills versus combination pill.  The combination pill is mainly for convenience. -Sent new prescription for strips so that he is able to test 3 times a day.  However his insurance may not pay for 3 times a day testing since he is not on insulin and patient was advised of this. -Encouraged him to continue regular exercise.  Discussed with him that he will need to eat smaller portion sizes to achieve weight loss. - glucose blood (ACCU-CHEK AVIVA PLUS) test strip; Test BS 3x/day with meals  Dispense: 100 each; Refill: 3  2. Class 2 severe obesity due to excess calories with serious comorbidity and body mass index (BMI) of 38.0 to 38.9 in adult [facility]) See #1 above  3. OSA on CPAP Compliant with using CPAP and benefits from it.  4. Onychomycosis of toenail Discussed treatment of onychomycosis.  Informed that oral medication works best.  However discussed possible side effect of the medication including drug-induced hepatitis.  He preferred topical.  Discussed use of Penlac. - ciclopirox (PENLAC) 8 % solution; Apply topically at bedtime. Apply over nail and surrounding skin. Apply daily over previous coat. After seven (7) days, may remove with alcohol and continue cycle.  Dispense: 6.6 mL; Refill: 3  5. Amputated toe, left ([place])   6. CAD in native artery Stable on current medications including aspirin, beta-blocker and statin therapy.  7. Systolic CHF, chronic ([place]) Compensated on current medications.   Given his blood pressure today, I will leave him on lisinopril rather than change to [facility].  He has follow-up appointment with the cardiologist in a few months.  8. Need for Tdap vaccination - Tdap vaccine greater than or equal to 7yo IM   Patient was given the opportunity to ask questions.  Patient verbalized understanding of the plan and was able to repeat key elements of the plan.   Orders Placed This Encounter  Procedures   Tdap vaccine greater than or equal to 7yo IM      Requested Prescriptions   Signed Prescriptions Disp Refills   glucose blood (ACCU-CHEK AVIVA PLUS) test strip 100 each 3    Sig: Test BS 3x/day with meals   ciclopirox (PENLAC) 8 % solution 6.6 mL 3    Sig: Apply topically at bedtime. Apply over nail and surrounding skin. Apply daily over previous coat. After seven (7) days, may remove with alcohol and continue cycle.    Return in about 4 months (around 12/24/2020).  Karle Plumber, MD, FACP

## 2020-08-24 NOTE — Patient Instructions (Signed)
Prescription has been sent to your pharmacy for the solution called Penlac to use on your toenails as discussed today.

## 2020-08-24 NOTE — Progress Notes (Signed)
Pt is wanting to speak with provider in regards to stopping Metformin and starting Janument

## 2020-08-27 NOTE — Progress Notes (Signed)
[facility] Note  08/31/2020     CHIEF COMPLAINT Patient presents for Diabetic Eye Exam   HISTORY OF PRESENT ILLNESS: Jon Jones is a 65 y.o. male who presents to the clinic today for:   HPI     Diabetic Eye Exam   Vision is stable and is blurred for distance.  Associated Symptoms Photophobia and Glare.  Diabetes characteristics include Type 2, controlled with diet and taking oral medications.  This started 13 years ago.  Blood sugar level is controlled.  Last Blood Glucose 137 (This a.m.).  Last A1C 13 (2 mos ago).  I, the attending physician,  performed the HPI with the patient and updated documentation appropriately.        Comments   Referral by Shirleen Schirmer, PA on 08/26/20 for DEE/eval of DME OS.  LEE prior was about 3 yrs ago.  VA blurred OU (worse OS).  Change has been gradual.  Pt gets a little photophobic at night, and has to strain more to see up close.  Denies pain, FOL, floaters.  Albert recommended several tx options for pt's dry eyes, but pt has yet to try any.      Last edited by Bernarda Caffey, MD on 09/01/2020  1:10 AM.    Pt is here on the referral of Shirleen Schirmer, PA-C for concern of DME OS, pt states his vision is completely different at night than during the day, he feels like they have something in them, he states [place] told him he has very dry eyes and gave him drops to try, pt states he has not started them yet, pt states his last A1c was 13 and his dr changes his medication, he states his blood sugar has been doing much better since changing it and he has started exercising   Referring physician: Shirleen Schirmer, PA-C [address] [place] [address] [place],  [place] [address]  HISTORICAL INFORMATION:   Selected notes from the MEDICAL RECORD NUMBER Referred by Shirleen Schirmer, PA for eval of diabetic retinopathy OU; DME OS   CURRENT MEDICATIONS: No current outpatient medications on file.  (Ophthalmic Drugs)   No current facility-administered medications for this visit. (Ophthalmic Drugs)   Current Outpatient Medications (Other)  Medication Sig   Accu-Chek Softclix Lancets lancets CHECK BLOOD SUGAR ONCE DAILY   aspirin 81 MG EC tablet Take 81 mg by mouth daily.   Blood Glucose Monitoring Suppl (ACCU-CHEK GUIDE ME) w/Device KIT Check blood sugar once daily.   ciclopirox (PENLAC) 8 % solution Apply topically at bedtime. Apply over nail and surrounding skin. Apply daily over previous coat. After seven (7) days, may remove with alcohol and continue cycle.   clobetasol ointment (TEMOVATE) 7.42 % Apply 1 application topically 2 (two) times daily as needed.   clopidogrel (PLAVIX) 75 MG tablet Take 75 mg by mouth daily.   empagliflozin (JARDIANCE) 25 MG TABS tablet Take 1 tablet (25 mg total) by mouth at bedtime.   gabapentin (NEURONTIN) 300 MG capsule Take 1 capsule (300 mg total) by mouth at bedtime.   glipiZIDE (GLUCOTROL) 10 MG tablet Take 2 tablets (20 mg total) by mouth 2 (two) times daily.   glucose blood (ACCU-CHEK AVIVA PLUS) test strip Test BS 3x/day with meals   metFORMIN (GLUCOPHAGE) 1000 MG tablet Take 1 tablet (1,000 mg total) by mouth 2 (two) times daily with a meal.   Misc. Devices MISC Cpap supplies-tubing mask Dx G47.33 and Z99.89  nitroGLYCERIN (NITROSTAT) 0.4 MG SL tablet Place 0.4 mg under the tongue See admin instructions. Every 5 minutes as needed for chest pain. Max of 3 doses   nystatin cream (MYCOSTATIN) SMARTSIG:Sparingly Topical 2-3 Times Daily PRN   PROAIR HFA 108 (90 Base) MCG/ACT inhaler INHALE 2 PUFFS INTO THE LUNGS EVERY 6 HOURS AS NEEDED FOR WHEEZING OR SHORTNESS OF BREATH   sacubitril-valsartan (ENTRESTO) 24-26 MG Take 1 tablet by mouth 2 (two) times daily.   sitaGLIPtin (JANUVIA) 100 MG tablet Take 1 tablet (100 mg total) by mouth daily.   spironolactone (ALDACTONE) 25 MG tablet Take 12.5 mg by mouth daily.   triamcinolone (KENALOG) 0.1 %  SMARTSIG:Sparingly Topical Twice Daily   atorvastatin (LIPITOR) 80 MG tablet Take 80 mg by mouth every evening.   metoprolol succinate (TOPROL-XL) 100 MG 24 hr tablet Take 100 mg by mouth daily.    No current facility-administered medications for this visit. (Other)      REVIEW OF SYSTEMS: ROS   Positive for: Endocrine, Cardiovascular, Eyes, Respiratory Negative for: Constitutional, Gastrointestinal, Neurological, Skin, Genitourinary, Musculoskeletal, HENT, Psychiatric, Allergic/Imm, Heme/Lymph Last edited by Matthew Folks, COA on 08/31/2020  9:27 AM.       ALLERGIES No Known Allergies  PAST MEDICAL HISTORY Past Medical History:  Diagnosis Date   5 years ago    AICD (automatic cardioverter/defibrillator) present    Anxiety    Arthritis    Cataract    CHF (congestive heart failure) ([name])    Coronary artery disease    Diabetes mellitus without complication ([name])    Diabetic retinopathy ([place])    Dysrhythmia    GERD (gastroesophageal reflux disease)    PMH   Hypertension    Hypertensive retinopathy    Peripheral vascular disease ([place])    Peripheral vascular disease ([place])    Pneumonia    Shoulder impingement, right    Sleep apnea    wears cpap   Stomach ulcer    Wears glasses    Past Surgical History:  Procedure Laterality Date   AMPUTATION Left 03/12/2019   Procedure: 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;  Surgeon: [name], MD;  Location: [facility];  Service: Orthopedics;  Laterality: Left;   CARDIAC CATHETERIZATION     CARDIAC DEFIBRILLATOR PLACEMENT     I & D EXTREMITY Left 07/19/2016   Procedure: IRRIGATION AND DEBRIDEMENT EXTREMITY/OLECRANON(WASHOUT);  Surgeon: Leandrew Koyanagi, MD;  Location: [facility];  Service: Orthopedics;  Laterality: Left;   OLECRANON BURSECTOMY Left 07/19/2016   Procedure: LEFT ELBOW OLECRANON BURSECTOMY;  Surgeon: Leandrew Koyanagi, MD;  Location: [facility];  Service: Orthopedics;  Laterality: Left;   SHOULDER ARTHROSCOPY Right 05/16/2019    Procedure: Right Shoulder Arthroscopy;  Surgeon: [name], MD;  Location: [facility];  Service: Orthopedics;  Laterality: Right;    FAMILY HISTORY Family History  Problem Relation Age of Onset   Heart disease Mother    Diabetes Mother    Peripheral vascular disease Mother     SOCIAL HISTORY Social History   Tobacco Use   Smoking status: Former    Packs/day: 1.00    Years: 40.00    Pack years: 40.00    Types: Cigarettes    Quit date: 03/11/2019    Years since quitting: 1.4   Smokeless tobacco: Never  Vaping Use   Vaping Use: Never used  Substance Use Topics   Alcohol use: Not Currently   Drug use: Yes    Types: Oxycodone  OPHTHALMIC EXAM:  Base Eye Exam     Visual Acuity (Snellen - Linear)       Right Left   Dist cc 20/25 20/30   Dist ph cc 20/20 -2 NI    Correction: Glasses         Tonometry (Tonopen, 9:31 AM)       Right Left   Pressure 15 12         Pupils       Dark Light Shape React APD   Right 4 3 Round Brisk None   Left 4 3 Round Brisk None         Visual Fields (Counting fingers)       Left Right    Full Full         Extraocular Movement       Right Left    Full, Ortho Full, Ortho         Neuro/Psych     Oriented x3: Yes   Mood/Affect: Normal         Dilation     Both eyes: 1.0% Mydriacyl, 2.5% Phenylephrine @ 9:31 AM           Slit Lamp and Fundus Exam     Slit Lamp Exam       Right Left   Lids/Lashes Dermatochalasis - upper lid Dermatochalasis - upper lid   Conjunctiva/Sclera nasal pingeucula nasal pingeucula   Cornea arcus, tear film debris, trace PEE arcus, tear film debris, trace PEE, old subepithelial scars inferior paracentral with haze   Anterior Chamber Deep and quiet Deep and quiet   Iris Round and dilated, No NVI Round and dilated, No NVI   Lens 2-3+ Nuclear sclerosis, 2-3+ Cortical cataract 2-3+ Nuclear sclerosis, 2-3+ Cortical cataract   Vitreous Vitreous syneresis Vitreous  syneresis         Fundus Exam       Right Left   Disc Pink and Sharp Pink and Sharp   C/D Ratio 0.2 0.2   Macula Flat, Blunted foveal reflex, +MA Flat, Blunted foveal reflex, +MA/DBH, trace cystic changes nasal fovea   Vessels attenuated, mild tortuousity attenuated, mild tortuousity   Periphery Attached; rare MA Attached; rare MA           Refraction     Wearing Rx       Sphere Cylinder Axis Add   Right +1.25 +1.00 137 +2.50   Left +1.00 +0.50 030 +2.50    Age: 57yr   Type: PAL         Manifest Refraction       Sphere Cylinder Axis Dist VA   Right +1.50 +0.75 140 20/20-   Left +1.50 +0.75 035 20/25-2            IMAGING AND PROCEDURES  Imaging and Procedures for 08/31/2020  OCT, Retina - OU - Both Eyes       Right Eye Quality was good. Central Foveal Thickness: 241. Progression has no prior data. Findings include normal foveal contour, no IRF, no SRF.   Left Eye Quality was good. Central Foveal Thickness: 247. Progression has no prior data. Findings include normal foveal contour, intraretinal hyper-reflective material, intraretinal fluid, no SRF (Focal cystic changes / IRHM nasal fovea).   Notes *Images captured and stored on drive  Diagnosis / Impression:  OD: NFP; no IRF/SRF  OS: Focal cystic changes / IRHM nasal fovea Clinical management:  See below  Abbreviations: NFP - Normal foveal profile. CME - cystoid macular  edema. PED - pigment epithelial detachment. IRF - intraretinal fluid. SRF - subretinal fluid. EZ - ellipsoid zone. ERM - epiretinal membrane. ORA - outer retinal atrophy. ORT - outer retinal tubulation. SRHM - subretinal hyper-reflective material. IRHM - intraretinal hyper-reflective material      Fluorescein Angiography Optos (Transit OS)       Right Eye Progression has no prior data. Early phase findings include microaneurysm. Mid/Late phase findings include microaneurysm, leakage (No NV).   Left Eye Progression has no prior  data. Early phase findings include delayed filling, microaneurysm (Patchy choroidal filling, delayed venous return). Mid/Late phase findings include microaneurysm, leakage (No NV).   Notes **Images stored on drive**  Impression: Moderate NPDR OU Late, leaking MA OU No NV OU              ASSESSMENT/PLAN:    ICD-10-CM   1. Moderate nonproliferative diabetic retinopathy of both eyes with macular edema associated with type 2 diabetes mellitus ([place])  C00.3491     2. Retinal edema  H35.81 OCT, Retina - OU - Both Eyes    3. Essential hypertension  I10     4. Hypertensive retinopathy of both eyes  H35.033 Fluorescein Angiography Optos (Transit OS)    5. Combined forms of age-related cataract of both eyes  H25.813       1,2. Moderate nonproliferative diabetic retinopathy OU  OD: no DME  OS: mild cystic changes / IRHM nasal macula  - pt self reports last A1c was ~13% -- last A1c in Epic is 11.0 on 5.13.22 - pt now working with Endodrinologist and blood glucose improving  - The incidence, risk factors for progression, natural history and treatment options for diabetic retinopathy were discussed with patient.   - The need for close monitoring of blood glucose, blood pressure, and serum lipids, avoiding cigarette or any type of tobacco, and the need for long term follow up was also discussed with patient. - exam shows scattered MA OU - FA (06.21.22) shows late leaking MA, no NV OU - OCT without diabetic macular edema OD; OS w/ focal cystic changes and IRHM nasal fovea  - no retinal intervention recommended at this time -- advised continued optimization of DM2 and HTN - f/u in 2-3 mos, sooner -- DFE/OCT  3,4. Hypertensive retinopathy OU - discussed importance of tight BP control - monitor  5. Mixed Cataract OU - The symptoms of cataract, surgical options, and treatments and risks were discussed with patient. - discussed diagnosis and progression - monitor for  now    Ophthalmic Meds Ordered this visit:  No orders of the defined types were placed in this encounter.      Return in about 2 months (around 10/31/2020) for f/u NPDR OU, DFE, OCT.  There are no Patient Instructions on file for this visit.   Explained the diagnoses, plan, and follow up with the patient and they expressed understanding.  Patient expressed understanding of the importance of proper follow up care.   This document serves as a record of services personally performed by Gardiner Sleeper, MD, PhD. It was created on their behalf by Estill Bakes, COT an ophthalmic technician. The creation of this record is the provider's dictation and/or activities during the visit.    Electronically signed by: Estill Bakes, COT 6.17.22 @ 1:17 AM   Gardiner Sleeper, M.D., Ph.D. Diseases & Surgery of the Retina and Vitreous [facility]  I have reviewed the above documentation for accuracy and completeness,  and I agree with the above. Gardiner Sleeper, M.D., Ph.D. 09/01/20 1:18 AM  Abbreviations: M myopia (nearsighted); A astigmatism; H hyperopia (farsighted); P presbyopia; Mrx spectacle prescription;  CTL contact lenses; OD right eye; OS left eye; OU both eyes  XT exotropia; ET esotropia; PEK punctate epithelial keratitis; PEE punctate epithelial erosions; DES dry eye syndrome; MGD meibomian gland dysfunction; ATs artificial tears; PFAT's preservative free artificial tears; [place] nuclear sclerotic cataract; PSC posterior subcapsular cataract; ERM epi-retinal membrane; PVD posterior vitreous detachment; RD retinal detachment; DM diabetes mellitus; DR diabetic retinopathy; NPDR non-proliferative diabetic retinopathy; PDR proliferative diabetic retinopathy; CSME clinically significant macular edema; DME diabetic macular edema; dbh dot blot hemorrhages; CWS cotton wool spot; POAG primary open angle glaucoma; C/D cup-to-disc ratio; HVF humphrey visual field; GVF goldmann visual  field; OCT optical coherence tomography; IOP intraocular pressure; BRVO Branch retinal vein occlusion; CRVO central retinal vein occlusion; CRAO central retinal artery occlusion; BRAO branch retinal artery occlusion; RT retinal tear; SB scleral buckle; PPV pars plana vitrectomy; VH Vitreous hemorrhage; PRP panretinal laser photocoagulation; IVK intravitreal kenalog; VMT vitreomacular traction; MH Macular hole;  NVD neovascularization of the disc; NVE neovascularization elsewhere; AREDS age related eye disease study; ARMD age related macular degeneration; POAG primary open angle glaucoma; EBMD epithelial/anterior basement membrane dystrophy; ACIOL anterior chamber intraocular lens; IOL intraocular lens; PCIOL posterior chamber intraocular lens; Phaco/IOL phacoemulsification with intraocular lens placement; [place] photorefractive keratectomy; LASIK laser assisted in situ keratomileusis; HTN hypertension; DM diabetes mellitus; COPD chronic obstructive pulmonary disease

## 2020-08-28 ENCOUNTER — Other Ambulatory Visit: Payer: Self-pay | Admitting: Internal Medicine

## 2020-08-28 DIAGNOSIS — E1142 Type 2 diabetes mellitus with diabetic polyneuropathy: Secondary | ICD-10-CM

## 2020-08-28 NOTE — Telephone Encounter (Signed)
Last RF 08/24/20 #100 3 RF-

## 2020-08-28 NOTE — Progress Notes (Deleted)
Cardiology Office Note:    Date:  08/28/2020   ID:  Jon Jones, DOB 03-24-1955, MRN [id number]  PCP:  Marcine Matar, MD  Cardiologist:  None  Electrophysiologist:  None   Referring MD: Marcine Matar, MD   No chief complaint on file.   History of Present Illness:    Jon Jones is a 65 y.o. male with a hx of CAD status post IMI in 2016, ischemic cardiomyopathy (EF 35%) status post ICD, tobacco use, OSA, PAD, T2DM, hypertension, hyperlipidemia who presents for follow-up.  He was referred by Dr. Laural Jones for evaluation of CAD, initially seen on 05/21/2020.  He had an inferior MI in [place] in 2015.  No intervention.  Echocardiogram 02/2015 showed LVEF 35%.  He has been following at [facility].  Echo on 04/2016 showed LVEF 35%, no change from prior echo.  Underwent ICD implantation 09/2016.  Moved to [place] 4-5 years ago.  He previously had followed at [facility] but wishes to transfer his care here.  He denies any chest pain.  Reports dyspnea with going up and down stairs.  Reports occasional lightheadedness, denies any syncope.  Denies any lower extremity edema.  Reports rare palpitations.  Denies any ICD shocks.  He has been taking DAPT, denies any bleeding issues.  Echo on 06/01/2020 showed LVEF 35 to 40%, severe akinesis of basal to mid inferior wall, normal RV function, no significant valvular disease.  ABIs on 06/08/2020 showed were right 0.91, left 0.87.  Zio patch x3 days on 06/08/2020 showed 1 episode of NSVT lasting 5 beats, rare PVCs.  Since last clinic visit,   Past Medical History:  Diagnosis Date   5 years ago    AICD (automatic cardioverter/defibrillator) present    Anxiety    Arthritis    CHF (congestive heart failure) ([name])    Coronary artery disease    Diabetes mellitus without complication ([name])    Dysrhythmia    GERD (gastroesophageal reflux disease)    PMH   Hypertension    Peripheral vascular disease ([name])    Peripheral vascular disease ([name])    Pneumonia     Shoulder impingement, right    Sleep apnea    wears cpap   Stomach ulcer    Wears glasses     Past Surgical History:  Procedure Laterality Date   AMPUTATION Left 03/12/2019   Procedure: 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;  Surgeon: [name], MD;  [facility];  Service: Orthopedics;  Laterality: Left;   CARDIAC CATHETERIZATION     CARDIAC DEFIBRILLATOR PLACEMENT     I & D EXTREMITY Left 07/19/2016   Procedure: IRRIGATION AND DEBRIDEMENT EXTREMITY/OLECRANON(WASHOUT);  Surgeon: Tarry Kos, MD;  Location[facility] OR;  Service: Orthopedics;  Laterality: Left;   OLECRANON BURSECTOMY Left 07/19/2016   Procedure: LEFT ELBOW OLECRANON BURSECTOMY;  Surgeon: Tarry Kos, MD;  Location: [facility];  Service: Orthopedics;  Laterality: Left;   SHOULDER ARTHROSCOPY Right 05/16/2019   Procedure: Right Shoulder Arthroscopy;  Surgeon: [name], MD;  Location: [facility] OR;  Service: Orthopedics;  Laterality: Right;    Current Medications: No outpatient medications have been marked as taking for the 08/30/20 encounter (Appointment) with Jon Ishikawa, MD.     Allergies:   Patient has no known allergies.   Social History   Socioeconomic History   Marital status: Married    Spouse name: Not on file   Number of children: Not on file   Years of education: Not on  file   Highest education level: Not on file  Occupational History   Not on file  Tobacco Use   Smoking status: Former    Packs/day: 1.00    Years: 40.00    Pack years: 40.00    Types: Cigarettes    Quit date: 03/11/2019    Years since quitting: 1.4   Smokeless tobacco: Never  Vaping Use   Vaping Use: Never used  Substance and Sexual Activity   Alcohol use: Not Currently   Drug use: Yes    Types: Oxycodone   Sexual activity: Not on file  Other Topics Concern   Not on file  Social History Narrative   Not on file   Social Determinants of Health   Financial Resource Strain: Not on file  Food Insecurity: Not  on file  Transportation Needs: Not on file  Physical Activity: Not on file  Stress: Not on file  Social Connections: Not on file     Family History: The patient's family history includes Diabetes in his mother; Heart disease in his mother; Peripheral vascular disease in his mother.  ROS:   Please see the history of present illness.     All other systems reviewed and are negative.  EKGs/Labs/Other Studies Reviewed:    The following studies were reviewed today:   EKG:  EKG is  ordered today.  The ekg ordered today demonstrates sinus rhythm, rate 93, frequent PVCs Q waves in inferior leads  Recent Labs: 04/26/2020: ALT 26; BUN 19; Creatinine, Ser 1.19; Hemoglobin 14.1; Platelets 198; Potassium 4.8; Sodium 138 07/23/2020: TSH 1.88  Recent Lipid Panel    Component Value Date/Time   CHOL 93 (L) 04/26/2020 1002   TRIG 289 (H) 04/26/2020 1002   HDL 25 (L) 04/26/2020 1002   CHOLHDL 3.7 04/26/2020 1002   LDLCALC 25 04/26/2020 1002    Physical Exam:    VS:  There were no vitals taken for this visit.    Wt Readings from Last 3 Encounters:  08/24/20 232 lb 9.6 oz (105.5 kg)  07/23/20 234 lb 8 oz (106.4 kg)  07/19/20 235 lb 12.8 oz (107 kg)     GEN: Well nourished, well developed in no acute distress HEENT: Normal NECK: No JVD; No carotid bruits LYMPHATICS: No lymphadenopathy CARDIAC: RRR, no murmurs, rubs, gallops RESPIRATORY:  Clear to auscultation without rales, wheezing or rhonchi  ABDOMEN: Soft, non-tender, non-distended MUSCULOSKELETAL:  No edema; No deformity  SKIN: Warm and dry NEUROLOGIC:  Alert and oriented x 3 PSYCHIATRIC:  Normal affect   ASSESSMENT:    No diagnosis found.  PLAN:     CAD: Status post reported inferior MI in 2014 in [place], a had nuclear stress test in 2015 showing inferior infarct, no ischemia, EF 36%.  Cath was not done. -Continue aspirin 81 mg daily Plavix 75 mg daily -Continue atorvastatin 80 mg daily -Continue Jardiance 25 mg  daily -Continue Toprol-XL 100 mg daily -Sublingual nitroglycerin as needed -Smoking cessation strongly encouraged  Chronic combined systolic and diastolic heart failure: Ischemic cardiomyopathy.  EF 35% on echo in 2018.  Status post ICD.    Echo on 06/01/2020 showed LVEF 35 to 40%, severe akinesis of basal to mid inferior wall, normal RV function, no significant valvular disease.   -On lisinopril 10 mg daily.  Would favor switching to entresto -Continue Toprol-XL 100 mg daily -Continue Aldactone 12.5 mg daily -Continue Jardiance 25 mg daily -Follows in device clinic with Dr Royann Shivers  Frequent PVCs: Noted on EKG at initial  visit.  Zio patch x3 days on 06/08/2020 showed 1 episode of NSVT lasting 5 beats, rare PVCs.  PAD: Continue aspirin, Plavix, statin.  ABIs on 06/08/2020 showed were right 0.91, left 0.87.    Hypertension: Continue lisinopril, Toprol-XL, Aldactone.  Appears well controlled.  Hyperlipidemia: On atorvastatin 80 mg daily and Zetia 10 mg daily.  LDL 25 on 04/26/2020, can discontinue Zetia  T2DM: A1c 10.1 on 04/26/2020.  Referred to endocrinology for management  Tobacco use: Patient counseled on the risk of tobacco use and cessation strongly encouraged  RTC in ***  Medication Adjustments/Labs and Tests Ordered: Current medicines are reviewed at length with the patient today.  Concerns regarding medicines are outlined above.  No orders of the defined types were placed in this encounter.  No orders of the defined types were placed in this encounter.   There are no Patient Instructions on file for this visit.   Signed, Jon Ishikawa, MD  08/28/2020 1:58 PM     [facility]

## 2020-08-30 ENCOUNTER — Other Ambulatory Visit: Payer: Self-pay

## 2020-08-30 ENCOUNTER — Encounter (INDEPENDENT_AMBULATORY_CARE_PROVIDER_SITE_OTHER): Payer: Self-pay

## 2020-08-30 ENCOUNTER — Ambulatory Visit (INDEPENDENT_AMBULATORY_CARE_PROVIDER_SITE_OTHER): Payer: Medicaid Other | Admitting: Cardiology

## 2020-08-30 ENCOUNTER — Telehealth: Payer: Self-pay | Admitting: *Deleted

## 2020-08-30 VITALS — BP 127/80 | HR 68 | Ht 65.5 in | Wt 231.8 lb

## 2020-08-30 DIAGNOSIS — Z79899 Other long term (current) drug therapy: Secondary | ICD-10-CM | POA: Diagnosis not present

## 2020-08-30 DIAGNOSIS — I5042 Chronic combined systolic (congestive) and diastolic (congestive) heart failure: Secondary | ICD-10-CM | POA: Diagnosis not present

## 2020-08-30 DIAGNOSIS — I2510 Atherosclerotic heart disease of native coronary artery without angina pectoris: Secondary | ICD-10-CM | POA: Diagnosis not present

## 2020-08-30 DIAGNOSIS — I739 Peripheral vascular disease, unspecified: Secondary | ICD-10-CM

## 2020-08-30 DIAGNOSIS — G4733 Obstructive sleep apnea (adult) (pediatric): Secondary | ICD-10-CM

## 2020-08-30 DIAGNOSIS — E1142 Type 2 diabetes mellitus with diabetic polyneuropathy: Secondary | ICD-10-CM

## 2020-08-30 DIAGNOSIS — I10 Essential (primary) hypertension: Secondary | ICD-10-CM | POA: Diagnosis not present

## 2020-08-30 MED ORDER — ENTRESTO 24-26 MG PO TABS: 1.0000 | ORAL_TABLET | Freq: Two times a day (BID) | ORAL | 0 refills | Status: DC

## 2020-08-30 NOTE — Progress Notes (Addendum)
Cardiology Office Note:    Date:  08/30/2020   ID:  Jon Jones, DOB 27-Sep-1955, MRN [id number]  PCP:  Ladell Pier, MD  Cardiologist:  None  Electrophysiologist:  None   Referring MD: Ladell Pier, MD   Chief Complaint  Patient presents with   Congestive Heart Failure   CC: Follow-up  History of Present Illness:    Jon Jones is a 65 y.o. male with a hx of CAD status post IMI in 2016, ischemic cardiomyopathy (EF 35%) status post ICD, tobacco use, OSA, PAD, T2DM, hypertension, hyperlipidemia who presents for follow-up.  He was referred by Dr. Wynetta Emery for evaluation of CAD, initially seen on 05/21/2020.  He had an inferior MI in [place] in 2015.  No intervention.  Echocardiogram 02/2015 showed LVEF 35%.  He has been following at [facility]  Echo on 04/2016 showed LVEF 35%, no change from prior echo.  Underwent ICD implantation 09/2016.  Moved to [place] 4-5 years ago.  He previously had followed at [facility] but wishes to transfer his care here.  He denies any chest pain.  Reports dyspnea with going up and down stairs.  Reports occasional lightheadedness, denies any syncope.  Denies any lower extremity edema.  Reports rare palpitations.  Denies any ICD shocks.  He has been taking DAPT, denies any bleeding issues.  Echo on 06/01/2020 showed LVEF 35 to 40%, severe akinesis of basal to mid inferior wall, normal RV function, no significant valvular disease.  ABIs on 06/08/2020 showed were right 0.91, left 0.87.  Zio patch x3 days on 06/08/2020 showed 1 episode of NSVT lasting 5 beats, rare PVCs.  Since last clinic visit, he is feeling good overall. His main complaint today is that he has sciatic pain radiating from his buttocks to his distal LE's. He was told he has stenosis. Also, he continues to see his diabetes specialist, who advised him to take his medications before eating. For his diet, he states his wife helps him eat healthily. He asks today if it is possible to check his A1C to see if  the medication changes are helping. For exercise, his son recently bought him a bicycle to ride, and he will begin this soon. He endorses sleep apnea and has a CPAP. However, he is in the process of changing suppliers. He does not currently see a sleep specialist. This is because he recently moved here from [place] and continues to search for more local providers. As of 2 years ago he is no longer smoking. He denies any chest pain, shortness of breath, palpitations, or exertional symptoms. No headaches, lightheadedness, or syncope to report. Also has no orthopnea or PND.   Past Medical History:  Diagnosis Date   5 years ago    AICD (automatic cardioverter/defibrillator) present    Anxiety    Arthritis    CHF (congestive heart failure) ([name])    Coronary artery disease    Diabetes mellitus without complication ([name])    Dysrhythmia    GERD (gastroesophageal reflux disease)    PMH   Hypertension    Peripheral vascular disease ([name])    Peripheral vascular disease ([name])    Pneumonia    Shoulder impingement, right    Sleep apnea    wears cpap   Stomach ulcer    Wears glasses     Past Surgical History:  Procedure Laterality Date   AMPUTATION Left 03/12/2019   Procedure: 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;  Surgeon: [name], MD;  Location: [place];  Service: Orthopedics;  Laterality: Left;   CARDIAC CATHETERIZATION     CARDIAC DEFIBRILLATOR PLACEMENT     I & D EXTREMITY Left 07/19/2016   Procedure: IRRIGATION AND DEBRIDEMENT EXTREMITY/OLECRANON(WASHOUT);  Surgeon: Leandrew Koyanagi, MD;  Location: [facility];  Service: Orthopedics;  Laterality: Left;   OLECRANON BURSECTOMY Left 07/19/2016   Procedure: LEFT ELBOW OLECRANON BURSECTOMY;  Surgeon: Leandrew Koyanagi, MD;  Location: [place];  Service: Orthopedics;  Laterality: Left;   SHOULDER ARTHROSCOPY Right 05/16/2019   Procedure: Right Shoulder Arthroscopy;  Surgeon: [name], MD;  Location: [place];  Service: Orthopedics;  Laterality: Right;     Current Medications: Current Meds  Medication Sig   Accu-Chek Softclix Lancets lancets CHECK BLOOD SUGAR ONCE DAILY   aspirin 81 MG EC tablet Take 81 mg by mouth daily.   atorvastatin (LIPITOR) 80 MG tablet Take 80 mg by mouth every evening.   Blood Glucose Monitoring Suppl (ACCU-CHEK GUIDE ME) w/Device KIT Check blood sugar once daily.   ciclopirox (PENLAC) 8 % solution Apply topically at bedtime. Apply over nail and surrounding skin. Apply daily over previous coat. After seven (7) days, may remove with alcohol and continue cycle.   clobetasol ointment (TEMOVATE) 6.76 % Apply 1 application topically 2 (two) times daily as needed.   clopidogrel (PLAVIX) 75 MG tablet Take 75 mg by mouth daily.   empagliflozin (JARDIANCE) 25 MG TABS tablet Take 1 tablet (25 mg total) by mouth at bedtime.   gabapentin (NEURONTIN) 300 MG capsule Take 1 capsule (300 mg total) by mouth at bedtime.   glipiZIDE (GLUCOTROL) 10 MG tablet Take 2 tablets (20 mg total) by mouth 2 (two) times daily.   glucose blood (ACCU-CHEK AVIVA PLUS) test strip Test BS 3x/day with meals   metFORMIN (GLUCOPHAGE) 1000 MG tablet Take 1 tablet (1,000 mg total) by mouth 2 (two) times daily with a meal.   metoprolol succinate (TOPROL-XL) 100 MG 24 hr tablet Take 100 mg by mouth daily.    Misc. Devices MISC Cpap supplies-tubing mask Dx G47.33 and Z99.89   nitroGLYCERIN (NITROSTAT) 0.4 MG SL tablet Place 0.4 mg under the tongue See admin instructions. Every 5 minutes as needed for chest pain. Max of 3 doses   nystatin cream (MYCOSTATIN) SMARTSIG:Sparingly Topical 2-3 Times Daily PRN   PROAIR HFA 108 (90 Base) MCG/ACT inhaler INHALE 2 PUFFS INTO THE LUNGS EVERY 6 HOURS AS NEEDED FOR WHEEZING OR SHORTNESS OF BREATH   sacubitril-valsartan (ENTRESTO) 24-26 MG Take 1 tablet by mouth 2 (two) times daily.   sitaGLIPtin (JANUVIA) 100 MG tablet Take 1 tablet (100 mg total) by mouth daily.   spironolactone (ALDACTONE) 25 MG tablet Take 12.5 mg by  mouth daily.   triamcinolone (KENALOG) 0.1 % SMARTSIG:Sparingly Topical Twice Daily   [DISCONTINUED] lisinopril (ZESTRIL) 10 MG tablet Take 1 tablet (10 mg total) by mouth daily.     Allergies:   Patient has no known allergies.   Social History   Socioeconomic History   Marital status: Married    Spouse name: Not on file   Number of children: Not on file   Years of education: Not on file   Highest education level: Not on file  Occupational History   Not on file  Tobacco Use   Smoking status: Former    Packs/day: 1.00    Years: 40.00    Pack years: 40.00    Types: Cigarettes    Quit date: 03/11/2019    Years since quitting: 1.4  Smokeless tobacco: Never  Vaping Use   Vaping Use: Never used  Substance and Sexual Activity   Alcohol use: Not Currently   Drug use: Yes    Types: Oxycodone   Sexual activity: Not on file  Other Topics Concern   Not on file  Social History Narrative   Not on file   Social Determinants of Health   Financial Resource Strain: Not on file  Food Insecurity: Not on file  Transportation Needs: Not on file  Physical Activity: Not on file  Stress: Not on file  Social Connections: Not on file     Family History: The patient's family history includes Diabetes in his mother; Heart disease in his mother; Peripheral vascular disease in his mother.  ROS:   Please see the history of present illness.    (+) Sciatic pain, radiates from buttocks to distal LE's All other systems reviewed and are negative.  EKGs/Labs/Other Studies Reviewed:    The following studies were reviewed today:  Monitor 06/08/2020: One episode of NSVT lasting 5 beats Rare PVCs (<1% of beats)   Patch Wear Time:  2 days and 22 hours (2022-03-17T01:26:33-0400 to 2022-03-20T00:25:48-0400)   Patient had a min HR of 59 bpm, max HR of 148 bpm, and avg HR of 77 bpm. Predominant underlying rhythm was Sinus Rhythm. 1 run of Ventricular Tachycardia occurred lasting 5 beats with a max  rate of 148 bpm (avg 134 bpm). Episode of Ventricular Tachycardia may be Supraventricular Tachycardia with possible aberrancy. Isolated SVEs were rare (<1.0%), SVE Triplets were rare (<1.0%), and no SVE Couplets were present. Isolated VEs were rare (<1.0%), VE Couplets were rare (<1.0%), and no VE Triplets were present.  No patient triggered events.  [place] ABI 06/08/2020: Summary:  Right: Resting right ankle-brachial index indicates mild right lower  extremity arterial disease.  The right toe-brachial index is abnormal.   Left: Resting left ankle-brachial index indicates mild left lower  extremity arterial disease.  The left toe-brachial index is abnormal.  Echo 06/01/2020: 1. Left ventricular ejection fraction, by estimation, is 35 to 40%. The  left ventricle has moderately decreased function. The left ventricle  demonstrates regional wall motion abnormalities (see scoring  diagram/findings for description). There is mild  left ventricular hypertrophy. Left ventricular diastolic parameters were  normal. There is severe akinesis of the left ventricular, basal-mid  inferior wall.   2. Right ventricular systolic function is normal. The right ventricular  size is normal.   3. The mitral valve is normal in structure. No evidence of mitral valve  regurgitation. No evidence of mitral stenosis.   4. The aortic valve is normal in structure. Aortic valve regurgitation is  not visualized.  EKG:   08/30/2020: EKG is not ordered today. 05/21/2020: sinus rhythm, rate 93, frequent PVCs Q waves in inferior leads  Recent Labs: 04/26/2020: ALT 26; BUN 19; Creatinine, Ser 1.19; Hemoglobin 14.1; Platelets 198; Potassium 4.8; Sodium 138 07/23/2020: TSH 1.88  Recent Lipid Panel    Component Value Date/Time   CHOL 93 (L) 04/26/2020 1002   TRIG 289 (H) 04/26/2020 1002   HDL 25 (L) 04/26/2020 1002   CHOLHDL 3.7 04/26/2020 1002   LDLCALC 25 04/26/2020 1002    Physical Exam:    VS:  BP 127/80   Pulse  68   Ht 5' 5.5" (1.664 m)   Wt 231 lb 12.8 oz (105.1 kg)   SpO2 98%   BMI 37.99 kg/m     Wt Readings from Last 3 Encounters:  08/30/20 231 lb 12.8 oz (105.1 kg)  08/24/20 232 lb 9.6 oz (105.5 kg)  07/23/20 234 lb 8 oz (106.4 kg)     GEN: Well nourished, well developed in no acute distress HEENT: Normal NECK: No JVD; No carotid bruits LYMPHATICS: No lymphadenopathy CARDIAC: RRR, no murmurs, rubs, gallops RESPIRATORY:  Clear to auscultation without rales, wheezing or rhonchi  ABDOMEN: Soft, non-tender, non-distended MUSCULOSKELETAL:  Trace edema; No deformity  SKIN: Warm and dry NEUROLOGIC:  Alert and oriented x 3 PSYCHIATRIC:  Normal affect   ASSESSMENT:    1. Chronic combined systolic and diastolic heart failure ([place])   2. Coronary artery disease involving native coronary artery of native heart without angina pectoris   3. Type 2 diabetes mellitus with peripheral neuropathy ([name])   4. Medication management   5. PAD (peripheral artery disease) ([place])   6. Essential hypertension   7. OSA (obstructive sleep apnea)     PLAN:     CAD: Status post reported inferior MI in 2014 in [place], a had nuclear stress test in 2015 showing inferior infarct, no ischemia, EF 36%.  Cath was not done. -Continue aspirin 81 mg daily and Plavix 75 mg daily -Continue atorvastatin 80 mg daily -Continue Jardiance 25 mg daily -Continue Toprol-XL 100 mg daily -Sublingual nitroglycerin as needed -Smoking cessation strongly encouraged  Chronic combined systolic and diastolic heart failure: Ischemic cardiomyopathy.  EF 35% on echo in 2018.  Status post ICD.  Echo on 06/01/2020 showed LVEF 35 to 40%, severe akinesis of basal to mid inferior wall, normal RV function, no significant valvular disease.   -On lisinopril 10 mg daily.  Recommend switching to Entresto 24-26 mg twice daily.  Discussed that must be off lisinopril for 36 hours prior to starting Entresto.  Check BMET in  1 week -Continue  Toprol-XL 100 mg daily -Continue Aldactone 12.5 mg daily -Continue Jardiance 25 mg daily -Follows in device clinic with Dr Sallyanne Kuster -Will schedule follow-up in pharmacy clinic in 2 weeks for further medication titration  PVCs: Frequqent PVCs noted on EKG at initial visit.  Zio patch x3 days on 06/08/2020 showed 1 episode of NSVT lasting 5 beats, rare PVCs.  PAD: Continue aspirin, Plavix, statin.  ABIs on 06/08/2020 showed were right 0.91, left 0.87.    Hypertension: Continue lisinopril, Toprol-XL, Aldactone.  Appears well controlled.  Switching from lisinopril to entresto as above  Hyperlipidemia: On atorvastatin 80 mg daily and Zetia 10 mg daily.  LDL 25 on 04/26/2020, discontinued Zetia  T2DM: A1c 10.1 on 04/26/2020.  Referred to endocrinology for management  OSA: Previously followed with sleep medicine in [place], needs sleep medicine physician in [place], will refer to Dr. Claiborne Billings.  RTC in 4 months  Medication Adjustments/Labs and Tests Ordered: Current medicines are reviewed at length with the patient today.  Concerns regarding medicines are outlined above.  Orders Placed This Encounter  Procedures   Basic metabolic panel   Hemoglobin A1c    Meds ordered this encounter  Medications   sacubitril-valsartan (ENTRESTO) 24-26 MG    Sig: Take 1 tablet by mouth 2 (two) times daily.    Dispense:  28 tablet    Refill:  0     Patient Instructions  Medication Instructions:  STOP Lisinopril  On WEDNESDAY: START Entresto 24/26 mg two times daily  *If you need a refill on your cardiac medications before your next appointment, please call your pharmacy*   Lab Work: Please return for labs in 1 week (BM[facility], [facility])  Our  in office lab hours are Monday-Friday 8:00-4:00, closed for lunch 12:45-1:45 pm.  No appointment needed.  If you have labs (blood work) drawn today and your tests are completely normal, you will receive your results only by: [place] (if you have MyChart)  OR A paper copy in the mail If you have any lab test that is abnormal or we need to change your treatment, we will call you to review the results.  Follow-Up: At [facility], you and your health needs are our priority.  As part of our continuing mission to provide you with exceptional heart care, we have created designated Provider Care Teams.  These Care Teams include your primary Cardiologist (physician) and Advanced Practice Providers (APPs -  Physician Assistants and Nurse Practitioners) who all work together to provide you with the care you need, when you need it.  We recommend signing up for the patient portal called "MyChart".  Sign up information is provided on this After Visit Summary.  MyChart is used to connect with patients for Virtual Visits (Telemedicine).  Patients are able to view lab/test results, encounter notes, upcoming appointments, etc.  Non-urgent messages can be sent to your provider as well.   To learn more about what you can do with MyChart, go to [email or web address].    Your next appointment:   2 weeks with pharmacist 4 months with Dr. Bruna Potter [place] as a scribe for Donato Heinz, MD.,have documented all relevant documentation on the behalf of Donato Heinz, MD,as directed by  Donato Heinz, MD while in the presence of Donato Heinz, MD.  I, Donato Heinz, MD, have reviewed all documentation for this visit. The documentation on 08/30/20 for the exam, diagnosis, procedures, and orders are all accurate and complete.   Signed, Donato Heinz, MD  08/30/2020 10:27 AM    [facility]

## 2020-08-30 NOTE — Patient Instructions (Signed)
Medication Instructions:  STOP Lisinopril  On WEDNESDAY: START Entresto 24/26 mg two times daily  *If you need a refill on your cardiac medications before your next appointment, please call your pharmacy*   Lab Work: Please return for labs in 1 week (BM[facility], [facility])  Our in office lab hours are Monday-Friday 8:00-4:00, closed for lunch 12:45-1:45 pm.  No appointment needed.  If you have labs (blood work) drawn today and your tests are completely normal, you will receive your results only by: MyChart Message (if you have MyChart) OR A paper copy in the mail If you have any lab test that is abnormal or we need to change your treatment, we will call you to review the results.  Follow-Up: At [facility], you and your health needs are our priority.  As part of our continuing mission to provide you with exceptional heart care, we have created designated Provider Care Teams.  These Care Teams include your primary Cardiologist (physician) and Advanced Practice Providers (APPs -  Physician Assistants and Nurse Practitioners) who all work together to provide you with the care you need, when you need it.  We recommend signing up for the patient portal called "MyChart".  Sign up information is provided on this After Visit Summary.  MyChart is used to connect with patients for Virtual Visits (Telemedicine).  Patients are able to view lab/test results, encounter notes, upcoming appointments, etc.  Non-urgent messages can be sent to your provider as well.   To learn more about what you can do with MyChart, go to [email or web address].    Your next appointment:   2 weeks with pharmacist 4 months with Dr. [name]

## 2020-08-30 NOTE — Telephone Encounter (Signed)
Hi. Dr. [name]. Mr. Jon Jones is planning on having upcoming lumbar fusion. You saw him today and it sounds like he was doing well overall. His main complaint was his sciatic pain. However, he denied any chest pain, shortness of breath, orthopnea/PND, palpitations, or syncope. His Lisinopril was stooped with plans to start Entresto in 2 days given persistently reduced EF. Is he OK to proceed with surgery? It sounds like he is well optimized. Also do you mind commenting on how long he can hold his Aspirin and Plavix for prior to surgery?  Please route response back to P CV DIV PREOP.   Thanks so much! Cloe Sockwell

## 2020-08-30 NOTE — Telephone Encounter (Signed)
   [facility] Pre-operative Risk Assessment    Patient Name: Jon Jones  DOB: 10-27-1955  MRN: [id number]   HEARTCARE STAFF: - Please ensure there is not already an duplicate clearance open for this procedure. - Under Visit Info/Reason for Call, type in Other and utilize the format Clearance MM/DD/YY or Clearance TBD. Do not use dashes or single digits. - If request is for dental extraction, please clarify the # of teeth to be extracted. - If the patient is currently at the dentist's office, call Pre-Op APP to address. If the patient is not currently in the dentist office, please route to the Pre-Op pool  Request for surgical clearance:  What type of surgery is being performed? L4-5, L5-S1 LUMBAR FUSION   When is this surgery scheduled? TBD   What type of clearance is required (medical clearance vs. Pharmacy clearance to hold med vs. Both)? MEDICAL  Are there any medications that need to be held prior to surgery and how long? ASA ND PLAVIX   Practice name and name of physician performing surgery? [facility]; DR. GARY CRAM   What is the office phone number? [phone]   7.   What is the office fax number? [phone] x VANESSA  8.   Anesthesia type (None, local, MAC, general) ? GENERAL    Jon Jones 08/30/2020, 3:47 PM  _________________________________________________________________   (provider comments below)

## 2020-08-31 ENCOUNTER — Ambulatory Visit (INDEPENDENT_AMBULATORY_CARE_PROVIDER_SITE_OTHER): Payer: Medicaid Other | Admitting: Ophthalmology

## 2020-08-31 ENCOUNTER — Encounter (INDEPENDENT_AMBULATORY_CARE_PROVIDER_SITE_OTHER): Payer: Self-pay | Admitting: Ophthalmology

## 2020-08-31 DIAGNOSIS — I10 Essential (primary) hypertension: Secondary | ICD-10-CM

## 2020-08-31 DIAGNOSIS — H3581 Retinal edema: Secondary | ICD-10-CM

## 2020-08-31 DIAGNOSIS — E113393 Type 2 diabetes mellitus with moderate nonproliferative diabetic retinopathy without macular edema, bilateral: Secondary | ICD-10-CM

## 2020-08-31 DIAGNOSIS — H35033 Hypertensive retinopathy, bilateral: Secondary | ICD-10-CM | POA: Diagnosis not present

## 2020-08-31 DIAGNOSIS — E113313 Type 2 diabetes mellitus with moderate nonproliferative diabetic retinopathy with macular edema, bilateral: Secondary | ICD-10-CM | POA: Diagnosis not present

## 2020-08-31 DIAGNOSIS — H25813 Combined forms of age-related cataract, bilateral: Secondary | ICD-10-CM

## 2020-09-01 ENCOUNTER — Encounter (INDEPENDENT_AMBULATORY_CARE_PROVIDER_SITE_OTHER): Payer: Self-pay | Admitting: Ophthalmology

## 2020-09-01 NOTE — Telephone Encounter (Signed)
Yes no further testing recommending prior to surgery.  He has no history of PCI, OK to hold ASA/plavix 5 days prior to surgery

## 2020-09-02 NOTE — Telephone Encounter (Signed)
   Name: Jon Jones  DOB: 04-Nov-1955  MRN: [id number]   Primary Cardiologist: [name], MD  Chart reviewed as part of pre-operative protocol coverage. Patient was contacted 09/02/2020 in reference to pre-operative risk assessment for pending surgery as outlined below.  Jon Jones was last seen on 08/30/20 by Dr. [name].  Per Dr. [name], pt is cleared for surgery without additional cardiovascular testing. He may hold ASA and plavix for 5 days prior to surgery.   Therefore, based on ACC/AHA guidelines, the patient would be at acceptable risk for the planned procedure without further cardiovascular testing.   The patient was advised that if he develops new symptoms prior to surgery to contact our office to arrange for a follow-up visit, and he verbalized understanding.  I will route this recommendation to the requesting party via Epic fax function and remove from pre-op pool. Please call with questions.  Roe Rutherford Elisa Kutner, PA 09/02/2020, 7:58 AM

## 2020-09-04 ENCOUNTER — Encounter: Payer: Self-pay | Admitting: Physical Medicine and Rehabilitation

## 2020-09-08 DIAGNOSIS — E1142 Type 2 diabetes mellitus with diabetic polyneuropathy: Secondary | ICD-10-CM | POA: Diagnosis not present

## 2020-09-08 DIAGNOSIS — I5042 Chronic combined systolic (congestive) and diastolic (congestive) heart failure: Secondary | ICD-10-CM | POA: Diagnosis not present

## 2020-09-08 DIAGNOSIS — Z79899 Other long term (current) drug therapy: Secondary | ICD-10-CM | POA: Diagnosis not present

## 2020-09-08 LAB — HEMOGLOBIN A1C
Est. average glucose Bld gHb Est-mCnc: 235 mg/dL
Hgb A1c MFr Bld: 9.8 % — ABNORMAL HIGH (ref 4.8–5.6)

## 2020-09-08 LAB — BASIC METABOLIC PANEL
BUN/Creatinine Ratio: 17 (ref 10–24)
BUN: 21 mg/dL (ref 8–27)
CO2: 18 mmol/L — ABNORMAL LOW (ref 20–29)
Calcium: 9.4 mg/dL (ref 8.6–10.2)
Chloride: 102 mmol/L (ref 96–106)
Creatinine, Ser: 1.25 mg/dL (ref 0.76–1.27)
Glucose: 217 mg/dL — ABNORMAL HIGH (ref 65–99)
Potassium: 4.8 mmol/L (ref 3.5–5.2)
Sodium: 135 mmol/L (ref 134–144)
eGFR: 65 mL/min/{1.73_m2} (ref >59)

## 2020-09-10 ENCOUNTER — Telehealth: Payer: Self-pay | Admitting: Cardiology

## 2020-09-10 ENCOUNTER — Other Ambulatory Visit: Payer: Self-pay

## 2020-09-10 DIAGNOSIS — R32 Unspecified urinary incontinence: Secondary | ICD-10-CM | POA: Diagnosis not present

## 2020-09-10 DIAGNOSIS — E119 Type 2 diabetes mellitus without complications: Secondary | ICD-10-CM | POA: Diagnosis not present

## 2020-09-10 MED ORDER — ENTRESTO 24-26 MG PO TABS: 1.0000 | ORAL_TABLET | Freq: Two times a day (BID) | ORAL | 11 refills | Status: AC

## 2020-09-10 NOTE — Telephone Encounter (Signed)
*  STAT* If patient is at the pharmacy, call can be transferred to refill team.   1. Which medications need to be refilled? (please list name of each medication and dose if known) Entresto   2. Which pharmacy/location (including street and city if local pharmacy) is medication to be sent to? [facility]   3. Do they need a 30 day or 90 day supply? Not sure

## 2020-09-15 ENCOUNTER — Ambulatory Visit (INDEPENDENT_AMBULATORY_CARE_PROVIDER_SITE_OTHER): Payer: Medicaid Other | Admitting: Pharmacist Clinician (PhC)/ Clinical Pharmacy Specialist

## 2020-09-15 ENCOUNTER — Other Ambulatory Visit: Payer: Self-pay

## 2020-09-15 DIAGNOSIS — I5042 Chronic combined systolic (congestive) and diastolic (congestive) heart failure: Secondary | ICD-10-CM | POA: Diagnosis not present

## 2020-09-15 DIAGNOSIS — I5022 Chronic systolic (congestive) heart failure: Secondary | ICD-10-CM | POA: Insufficient documentation

## 2020-09-15 MED ORDER — METOPROLOL SUCCINATE ER 100 MG PO TB24: 100.0000 mg | ORAL_TABLET | Freq: Every day | ORAL | 3 refills | Status: DC

## 2020-09-15 MED ORDER — ATORVASTATIN CALCIUM 80 MG PO TABS: 80.0000 mg | ORAL_TABLET | Freq: Every evening | ORAL | 3 refills | Status: DC

## 2020-09-15 MED ORDER — SPIRONOLACTONE 25 MG PO TABS: 12.5000 mg | ORAL_TABLET | Freq: Every day | ORAL | 3 refills | Status: DC

## 2020-09-15 NOTE — Progress Notes (Signed)
09/15/2020 Gweneth Dimitri 01-26-1956 [id number]   HPI:  Jon Jones is a 65 y.o. male patient of Dr Gardiner Rhyme, with a PMH below who presents today for heart failure medication titration.  His most recent echo (3/22) showed EF at 35-40% with severe akinesis of basal to mid inferior wall.  At his visit 2 weeks ago with Dr. Gardiner Rhyme his lisinopril was switched to [facility] 24/26 after 36 hour wash out.  He returns to day for medication titration.    Patient reports feeling well overall.  Has been working to get his blood sugar down   Past Medical History: CAD S/P MI 2014; most recent ABI's 0.91 on right, 0.87 on left  Hypertension Controlled at last visit, now on Entresto, metoprolol, spironolactone  DM2 6/22 A1c 9.8 (down from 11 earlier this year); on Jardiance, glipizide, metformin, Januvia  hyperlipidemia 2/22 LDL 25, TG 289 - on atorvastatin 80 mg   OSA Uses CPAP     Blood Pressure Goal:  130/80  Current Medications: Entresto 24/26 mg bid, metoprolol succ 100 mg qd, spironolactone 12.5 mg qd, Jardiance 25 mg qd  Family Hx: mother had heart disease, DM, varicose veins, died at 38, not sure about father (lived in [place]); son with DM, obesity  Social Hx: no longer smokes (1 ppd x 40 years), no alcohol; coffee 2-3 cups per day, no soda  Diet: mostly home cooked; watches meat intake, rarely adds salt to food  Exercise: elliptical at home, trying to use more; just got a bicycle and hopes to ride regularly  Home BP readings: has never need to check  Intolerances: nkda  Labs: 09/08/20:  Na 135, K 4.8, Glu 217, BUN 21, SCr 1.25, GFR 65   Wt Readings from Last 3 Encounters:  09/15/20 238 lb (108 kg)  08/30/20 231 lb 12.8 oz (105.1 kg)  08/24/20 232 lb 9.6 oz (105.5 kg)   BP Readings from Last 3 Encounters:  09/15/20 110/62  08/30/20 127/80  08/24/20 107/72   Pulse Readings from Last 3 Encounters:  09/15/20 82  08/30/20 68  08/24/20 69    Current Outpatient Medications   Medication Sig Dispense Refill   Accu-Chek Softclix Lancets lancets CHECK BLOOD SUGAR ONCE DAILY 100 each 2   aspirin 81 MG EC tablet Take 81 mg by mouth daily.     atorvastatin (LIPITOR) 80 MG tablet Take 1 tablet (80 mg total) by mouth every evening. 90 tablet 3   Blood Glucose Monitoring Suppl (ACCU-CHEK GUIDE ME) w/Device KIT Check blood sugar once daily. 1 kit 0   ciclopirox (PENLAC) 8 % solution Apply topically at bedtime. Apply over nail and surrounding skin. Apply daily over previous coat. After seven (7) days, may remove with alcohol and continue cycle. 6.6 mL 3   clobetasol ointment (TEMOVATE) 4.00 % Apply 1 application topically 2 (two) times daily as needed. 30 g 0   clopidogrel (PLAVIX) 75 MG tablet Take 75 mg by mouth daily.     empagliflozin (JARDIANCE) 25 MG TABS tablet Take 1 tablet (25 mg total) by mouth at bedtime. 90 tablet 2   gabapentin (NEURONTIN) 300 MG capsule Take 1 capsule (300 mg total) by mouth at bedtime. 30 capsule 5   glipiZIDE (GLUCOTROL) 10 MG tablet Take 2 tablets (20 mg total) by mouth 2 (two) times daily. 360 tablet 2   glucose blood (ACCU-CHEK AVIVA PLUS) test strip Test BS 3x/day with meals 100 each 3   metFORMIN (GLUCOPHAGE) 1000 MG tablet Take 1  tablet (1,000 mg total) by mouth 2 (two) times daily with a meal. 180 tablet 2   metoprolol succinate (TOPROL-XL) 100 MG 24 hr tablet Take 1 tablet (100 mg total) by mouth daily. 90 tablet 3   Misc. Devices MISC Cpap supplies-tubing mask Dx G47.33 and Z99.89 1 Device 0   nitroGLYCERIN (NITROSTAT) 0.4 MG SL tablet Place 0.4 mg under the tongue See admin instructions. Every 5 minutes as needed for chest pain. Max of 3 doses     nystatin cream (MYCOSTATIN) SMARTSIG:Sparingly Topical 2-3 Times Daily PRN     PROAIR HFA 108 (90 Base) MCG/ACT inhaler INHALE 2 PUFFS INTO THE LUNGS EVERY 6 HOURS AS NEEDED FOR WHEEZING OR SHORTNESS OF BREATH 8.5 g 0   sacubitril-valsartan (ENTRESTO) 24-26 MG Take 1 tablet by mouth 2 (two)  times daily. 60 tablet 11   sitaGLIPtin (JANUVIA) 100 MG tablet Take 1 tablet (100 mg total) by mouth daily. 90 tablet 2   spironolactone (ALDACTONE) 25 MG tablet Take 0.5 tablets (12.5 mg total) by mouth daily. 45 tablet 3   triamcinolone (KENALOG) 0.1 % SMARTSIG:Sparingly Topical Twice Daily     No current facility-administered medications for this visit.    No Known Allergies  Past Medical History:  Diagnosis Date   5 years ago    AICD (automatic cardioverter/defibrillator) present    Anxiety    Arthritis    Cataract    CHF (congestive heart failure) ([name])    Coronary artery disease    Diabetes mellitus without complication ([name])    Diabetic retinopathy ([place])    Dysrhythmia    GERD (gastroesophageal reflux disease)    PMH   Hypertension    Hypertensive retinopathy    Peripheral vascular disease ([name])    Peripheral vascular disease ([name])    Pneumonia    Shoulder impingement, right    Sleep apnea    wears cpap   Stomach ulcer    Wears glasses     Blood pressure 110/62, pulse 82, height 5' 6"  (1.676 m), weight 238 lb (108 kg).  Chronic combined systolic and diastolic heart failure ([place]) Patient with HFrEF at 35%, with little change over the past several years.  Currently on GDMT and tolerating well.  Based on reports of occasional positional orthostatic hypotension, and recent Epic BP readings, will leave his medications unchanged at this time.  He was encouraged to work on increasing exercise, lowering A1c and weight loss to help with overall cardiac health.  Discussed exercise options to start with 20 minute bike rides and increase as tolerated, start with 5-10 minute increments on elliptical.     Tommy Medal PharmD CPP [place] [address] [place], [place] [address] [phone]

## 2020-09-15 NOTE — Patient Instructions (Signed)
  Take your BP meds as follows:  Continue with all current medications  Work on increasing your exercise - try to get at least 20-30 minutes per day, with a goal of 150 minutes total each week.    Bring all of your meds, your BP cuff and your record of home blood pressures to your next appointment.  Exercise as you're able, try to walk approximately 30 minutes per day.  Keep salt intake to a minimum, especially watch canned and prepared boxed foods.  Eat more fresh fruits and vegetables and fewer canned items.  Avoid eating in fast food restaurants.    HOW TO TAKE YOUR BLOOD PRESSURE: Rest 5 minutes before taking your blood pressure.  Don't smoke or drink caffeinated beverages for at least 30 minutes before. Take your blood pressure before (not after) you eat. Sit comfortably with your back supported and both feet on the floor (don't cross your legs). Elevate your arm to heart level on a table or a desk. Use the proper sized cuff. It should fit smoothly and snugly around your bare upper arm. There should be enough room to slip a fingertip under the cuff. The bottom edge of the cuff should be 1 inch above the crease of the elbow. Ideally, take 3 measurements at one sitting and record the average.

## 2020-09-15 NOTE — Assessment & Plan Note (Signed)
Patient with HFrEF at 35%, with little change over the past several years.  Currently on GDMT and tolerating well.  Based on reports of occasional positional orthostatic hypotension, and recent Epic BP readings, will leave his medications unchanged at this time.  He was encouraged to work on increasing exercise, lowering A1c and weight loss to help with overall cardiac health.  Discussed exercise options to start with 20 minute bike rides and increase as tolerated, start with 5-10 minute increments on elliptical.

## 2020-09-16 ENCOUNTER — Other Ambulatory Visit: Payer: Self-pay | Admitting: Internal Medicine

## 2020-09-20 ENCOUNTER — Telehealth: Payer: Self-pay | Admitting: Cardiology

## 2020-09-20 NOTE — Telephone Encounter (Signed)
**Note De-Identified Tung Pustejovsky Obfuscation** I started a Entresto PA through covermymeds. Key: [id number]

## 2020-09-20 NOTE — Telephone Encounter (Signed)
    Pt c/o medication issue:  1. Name of Medication:   sacubitril-valsartan (ENTRESTO) 24-26 MG    2. How are you currently taking this medication (dosage and times per day)? Take 1 tablet by mouth 2 (two) times daily.  3. Are you having a reaction (difficulty breathing--STAT)?   4. What is your medication issue? Pt said he received an email from his insurance that his entresto was denied due to not enough records shows that he needs it. He would like to know what he needs to do to make an appeal

## 2020-09-20 NOTE — Telephone Encounter (Signed)
**Note De-Identified Kalese Ensz Obfuscation** Norberta Keens Key: [id number] - PA Case ID: [id number] Outcome: Approved today Coverage Starts on: 09/20/2020 12:00:00 AM, Coverage Ends on: 09/20/2021 12:00:00 AM. Drug: [name] 24-26MG  tablets Form [employer] [facility] Electronic [place] Form [phone] NCPDP)  I have notified [facility] pharmacy of this approval.

## 2020-09-20 NOTE — Telephone Encounter (Signed)
Pt returning your call.... please advise 

## 2020-09-20 NOTE — Telephone Encounter (Signed)
Left detailed message (ok per DPR) making patient aware.

## 2020-09-21 ENCOUNTER — Other Ambulatory Visit: Payer: Self-pay

## 2020-09-21 ENCOUNTER — Ambulatory Visit (HOSPITAL_COMMUNITY)
Admission: EM | Admit: 2020-09-21 | Discharge: 2020-09-21 | Disposition: A | Discharge status: Home / Self Care | Payer: Medicaid Other | Attending: Internal Medicine | Admitting: Internal Medicine

## 2020-09-21 ENCOUNTER — Ambulatory Visit (INDEPENDENT_AMBULATORY_CARE_PROVIDER_SITE_OTHER): Payer: Medicaid Other

## 2020-09-21 ENCOUNTER — Encounter (HOSPITAL_COMMUNITY): Payer: Self-pay | Admitting: Emergency Medicine

## 2020-09-21 DIAGNOSIS — E119 Type 2 diabetes mellitus without complications: Secondary | ICD-10-CM | POA: Diagnosis not present

## 2020-09-21 DIAGNOSIS — L97518 Non-pressure chronic ulcer of other part of right foot with other specified severity: Secondary | ICD-10-CM

## 2020-09-21 DIAGNOSIS — S92354A Nondisplaced fracture of fifth metatarsal bone, right foot, initial encounter for closed fracture: Secondary | ICD-10-CM | POA: Diagnosis not present

## 2020-09-21 DIAGNOSIS — L02611 Cutaneous abscess of right foot: Secondary | ICD-10-CM | POA: Diagnosis not present

## 2020-09-21 DIAGNOSIS — L03031 Cellulitis of right toe: Secondary | ICD-10-CM

## 2020-09-21 DIAGNOSIS — M79671 Pain in right foot: Secondary | ICD-10-CM | POA: Diagnosis not present

## 2020-09-21 DIAGNOSIS — E11621 Type 2 diabetes mellitus with foot ulcer: Secondary | ICD-10-CM

## 2020-09-21 DIAGNOSIS — M7731 Calcaneal spur, right foot: Secondary | ICD-10-CM | POA: Diagnosis not present

## 2020-09-21 DIAGNOSIS — L97519 Non-pressure chronic ulcer of other part of right foot with unspecified severity: Secondary | ICD-10-CM | POA: Diagnosis not present

## 2020-09-21 DIAGNOSIS — M7989 Other specified soft tissue disorders: Secondary | ICD-10-CM | POA: Diagnosis not present

## 2020-09-21 MED ORDER — DOXYCYCLINE HYCLATE 100 MG PO CAPS: 100.0000 mg | ORAL_CAPSULE | Freq: Two times a day (BID) | ORAL | 0 refills | Status: DC

## 2020-09-21 NOTE — ED Triage Notes (Signed)
Pt is present today with an open sore on his right second toe. Pt states that he noticed the a black area surrounding his toe yesterday and now today he has a open sore. Pt is also diabetic

## 2020-09-21 NOTE — ED Provider Notes (Signed)
[facility]    CSN: [id number] Arrival date & time: 09/21/20  1043      History   Chief Complaint Chief Complaint  Patient presents with   Toe Pain    Right     HPI Jon Jones is a 65 y.o. male.   Patient presents with right second toe pain along with swelling and discoloration to the toe.  Patient states that he noticed the wound yesterday.  Patient states that he is a diabetic.  Patient wears slippers around the house that do not cover his toes and is suspicious he could have gotten a sore from that.  Denies any drainage from wound or any fevers.  Denies decreased sensation in feet or toe.  Denies any numbness or tingling.  Denies any pain in any other part of the foot or leg.  Patient also has toenail fungal infection that was being treated by primary care with toenail antifungal polish per patient.    Toe Pain   Past Medical History:  Diagnosis Date   5 years ago    AICD (automatic cardioverter/defibrillator) present    Anxiety    Arthritis    Cataract    CHF (congestive heart failure) ([name])    Coronary artery disease    Diabetes mellitus without complication ([name])    Diabetic retinopathy ([place])    Dysrhythmia    GERD (gastroesophageal reflux disease)    PMH   Hypertension    Hypertensive retinopathy    Peripheral vascular disease ([name])    Peripheral vascular disease ([name])    Pneumonia    Shoulder impingement, right    Sleep apnea    wears cpap   Stomach ulcer    Wears glasses     Patient Active Problem List   Diagnosis Date Noted   Chronic combined systolic and diastolic heart failure ([place]) 09/15/2020   Onychomycosis of toenail 08/24/2020   Diabetes mellitus ([place]) 07/23/2020   Type 2 diabetes mellitus with hyperglycemia, without long-term current use of insulin ([place]) 07/23/2020   Weight gain 07/23/2020   Type 2 diabetes mellitus with peripheral neuropathy ([place]) 07/23/2020   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 diabetes mellitus ([place]) 05/04/2020    Hypertensive heart disease with heart failure ([place]) 04/26/2020   Type 2 diabetes mellitus with diabetic peripheral angiopathy without gangrene, without long-term current use of insulin ([place]) 04/26/2020   OSA on CPAP 04/26/2020   PAD (peripheral artery disease) ([place]) 04/26/2020   CAD in native artery 04/26/2020   Class 2 severe obesity due to excess calories with serious comorbidity and body mass index (BMI) of 38.0 to 38.9 in adult [facility]) 04/26/2020   Impingement syndrome of right shoulder    Nontraumatic complete tear of right rotator cuff    Type 2 diabetes mellitus with foot ulcer, without long-term current use of insulin ([name])    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   S/P debridement 07/19/2016   Acute gout of left elbow 07/19/2016    Past Surgical History:  Procedure Laterality Date   AMPUTATION Left 03/12/2019   Procedure: 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;  Surgeon: [name], MD;  Location: [facility];  Service: Orthopedics;  Laterality: Left;   CARDIAC CATHETERIZATION     CARDIAC DEFIBRILLATOR PLACEMENT     I & D EXTREMITY Left 07/19/2016   Procedure: IRRIGATION AND DEBRIDEMENT EXTREMITY/OLECRANON(WASHOUT);  Surgeon: Leandrew Koyanagi, MD;  Location: [place];  Service: Orthopedics;  Laterality: Left;   OLECRANON BURSECTOMY Left 07/19/2016  Procedure: LEFT ELBOW OLECRANON BURSECTOMY;  Surgeon: Leandrew Koyanagi, MD;  Location: [facility];  Service: Orthopedics;  Laterality: Left;   SHOULDER ARTHROSCOPY Right 05/16/2019   Procedure: Right Shoulder Arthroscopy;  Surgeon: [name], MD;  Location: [facility];  Service: Orthopedics;  Laterality: Right;       Home Medications    Prior to Admission medications   Medication Sig Start Date End Date Taking? Authorizing Provider  doxycycline (VIBRAMYCIN) 100 MG capsule Take 1 capsule (100 mg total) by mouth 2 (two) times daily for 10 days. 09/21/20 10/01/20 Yes Odis Luster, FNP  Accu-Chek Softclix Lancets lancets  CHECK BLOOD SUGAR ONCE DAILY 08/12/20   Ladell Pier, MD  aspirin 81 MG EC tablet Take 81 mg by mouth daily. 09/12/18   [provider]  atorvastatin (LIPITOR) 80 MG tablet Take 1 tablet (80 mg total) by mouth every evening. 09/15/20   Donato Heinz, MD  Blood Glucose Monitoring Suppl (ACCU-CHEK GUIDE ME) w/Device KIT Check blood sugar once daily. 04/30/20   Ladell Pier, MD  ciclopirox (PENLAC) 8 % solution Apply topically at bedtime. Apply over nail and surrounding skin. Apply daily over previous coat. After seven (7) days, may remove with alcohol and continue cycle. 08/24/20   Ladell Pier, MD  clobetasol ointment (TEMOVATE) 7.62 % Apply 1 application topically 2 (two) times daily as needed. 04/30/20   Ladell Pier, MD  clopidogrel (PLAVIX) 75 MG tablet Take 75 mg by mouth daily. 12/25/18   [provider]  empagliflozin (JARDIANCE) 25 MG TABS tablet Take 1 tablet (25 mg total) by mouth at bedtime. 07/23/20   Shamleffer, Melanie Crazier, MD  gabapentin (NEURONTIN) 300 MG capsule Take 1 capsule (300 mg total) by mouth at bedtime. 04/26/20   Ladell Pier, MD  glipiZIDE (GLUCOTROL) 10 MG tablet Take 2 tablets (20 mg total) by mouth 2 (two) times daily. 07/23/20   Shamleffer, Melanie Crazier, MD  glucose blood (ACCU-CHEK AVIVA PLUS) test strip Test BS 3x/day with meals 08/24/20   Ladell Pier, MD  metFORMIN (GLUCOPHAGE) 1000 MG tablet Take 1 tablet (1,000 mg total) by mouth 2 (two) times daily with a meal. 07/23/20   Shamleffer, Melanie Crazier, MD  metoprolol succinate (TOPROL-XL) 100 MG 24 hr tablet Take 1 tablet (100 mg total) by mouth daily. 09/15/20   Donato Heinz, MD  Misc. Devices MISC Cpap supplies-tubing mask Dx G47.33 and Z99.89 07/20/20   Ladell Pier, MD  nitroGLYCERIN (NITROSTAT) 0.4 MG SL tablet Place 0.4 mg under the tongue See admin instructions. Every 5 minutes as needed for chest pain. Max of 3 doses 04/03/16   [provider]  nystatin cream (MYCOSTATIN) SMARTSIG:Sparingly Topical 2-3 Times Daily PRN 05/10/20   [provider]  PROAIR HFA 108 (90 Base) MCG/ACT inhaler INHALE 2 PUFFS INTO THE LUNGS EVERY 6 HOURS AS NEEDED FOR WHEEZING OR SHORTNESS OF BREATH 09/16/20   Ladell Pier, MD  sacubitril-valsartan (ENTRESTO) 24-26 MG Take 1 tablet by mouth 2 (two) times daily. 09/10/20   Donato Heinz, MD  sitaGLIPtin (JANUVIA) 100 MG tablet Take 1 tablet (100 mg total) by mouth daily. 07/23/20   Shamleffer, Melanie Crazier, MD  spironolactone (ALDACTONE) 25 MG tablet Take 0.5 tablets (12.5 mg total) by mouth daily. 09/15/20   Donato Heinz, MD  triamcinolone (KENALOG) 0.1 % SMARTSIG:Sparingly Topical Twice Daily 05/10/20   [provider]    Family History Family History  Problem Relation Age of Onset   Heart disease Mother    Diabetes Mother    Peripheral vascular disease Mother     Social History Social History   Tobacco Use   Smoking status: Former    Packs/day: 1.00    Years: 40.00    Pack years: 40.00    Types: Cigarettes    Quit date: 03/11/2019    Years since quitting: 1.5   Smokeless tobacco: Never  Vaping Use   Vaping Use: Never used  Substance Use Topics   Alcohol use: Not Currently   Drug use: Yes    Types: Oxycodone     Allergies   Patient has no known allergies.   Review of Systems Review of Systems Per HPI  Physical Exam Triage Vital Signs ED Triage Vitals [09/21/20 1234]  Enc Vitals Group     BP 126/67     Pulse Rate 84     Resp 18     Temp 98.3 F (36.8 C)     Temp src      SpO2 99 %     Weight      Height      Head Circumference      Peak Flow      Pain Score      Pain Loc      Pain Edu?      Excl. in [place]?    No data found.  Updated Vital Signs BP 126/67   Pulse 84   Temp 98.3 F (36.8 C)   Resp 18   SpO2 99%   Visual Acuity Right Eye Distance:   Left Eye Distance:   Bilateral Distance:    Right Eye  Near:   Left Eye Near:    Bilateral Near:     Physical Exam Constitutional:      Appearance: Normal appearance.  HENT:     Head: Normocephalic and atraumatic.  Eyes:     Extraocular Movements: Extraocular movements intact.     Conjunctiva/sclera: Conjunctivae normal.  Cardiovascular:     Pulses:          Dorsalis pedis pulses are 2+ on the right side and 2+ on the left side.  Pulmonary:     Effort: Pulmonary effort is normal.  Feet:     Right foot:     Skin integrity: Ulcer, skin breakdown and erythema present.     Toenail Condition: Right toenails are abnormally thick.     Comments: Patient has erythematous and swollen right second toe.  Also has circular wound that is present on bottom of toe directly beneath the toenail.  Wound is yellow to brown discoloration but no drainage present.  No necrosis present at this time. Neurological:     General: No focal deficit present.     Mental Status: He is alert and oriented to person, place, and time. Mental status is at baseline.  Psychiatric:        Mood and Affect: Mood normal.        Behavior: Behavior normal.        Thought Content: Thought content normal.        Judgment: Judgment normal.     UC Treatments / Results  Labs (all labs ordered are listed, but only abnormal results are displayed) Labs Reviewed - No data to display  EKG   Radiology DG Foot Complete Right  Result Date: 09/21/2020 CLINICAL DATA:  Chronic foot pain. Diabetic. Rule out osteomyelitis. EXAM: RIGHT FOOT COMPLETE -  3+ VIEW COMPARISON:  None. FINDINGS: Mild diffuse soft tissue swelling. Age indeterminate fracture is noted involving the head of the fifth metatarsal bone laterally. Hallux valgus deformity. Small plantar and posterior calcaneal heel spurs. No focal bone erosions identified IMPRESSION: 1. Age indeterminate fracture involves the head of the fifth metatarsal bone. 2. No focal bone erosions noted suggestive of osteomyelitis. 3. Soft tissue  swelling. Electronically Signed   By: Kerby Moors M.D.   On: 09/21/2020 13:43    Procedures Procedures (including critical care time)  Medications Ordered in UC Medications - No data to display  Initial Impression / Assessment and Plan / UC Course  I have reviewed the triage vital signs and the nursing notes.  Pertinent labs & imaging results that were available during my care of the patient were reviewed by me and considered in my medical decision making (see chart for details).   Suggested patient that he go to the hospital due to appearance of toe on exam.  Patient also has age-indeterminate right fifth metatarsal fracture.  Patient refused to go to the hospital.  Risks associated with not going to the hospital were discussed with patient.  Patient accepted risks and verbalized understanding.  Patient was prescribed doxycycline x10 days to treat infection.  Wound dressing was applied.  Patient advised to keep wound clean, dry, and covered.  Podiatrist Hardie Pulley, MD) on call was notified of patient information. Patient provided with on-call podiatry contact information and was advised to call today as soon as possible to set up appointment for further evaluation and management of right toe infection.  Patient was also provided orthopedics contact information for further evaluation and management of right foot fracture. Patient provided with boot for fracture until being evaluated by orthopedist.  Discussed strict return precautions. Patient verbalized understanding and is agreeable with plan.  Final Clinical Impressions(s) / UC Diagnoses   Final diagnoses:  Skin ulcer of second toe, right, with unspecified severity ([name])  Abscess or cellulitis of toe, right  Nondisplaced fracture of fifth metatarsal bone, right foot, initial encounter for closed fracture  Diabetic ulcer of toe of right foot associated with type 2 diabetes mellitus, with other ulcer severity ([place])     Discharge  Instructions      You have been prescribed doxycycline antibiotic for treatment of right toe infection.  I am unable to prescribe ibuprofen at this time due to interaction with your current daily prescription medicines.  May take Tylenol as needed for pain.  I highly suggest that you go to the hospital for further evaluation but since you have declined the option, please follow-up with the specialists listed below.  Please call provided contact information for podiatry today further evaluation and management of right toe infection and ulcer.  Please call provided contact information for orthopedist today for further evaluation and management of right foot fracture.  You also have a right foot fracture     ED Prescriptions     Medication Sig Dispense Auth. Provider   doxycycline (VIBRAMYCIN) 100 MG capsule Take 1 capsule (100 mg total) by mouth 2 (two) times daily for 10 days. 20 capsule Odis Luster, FNP      PDMP not reviewed this encounter.   Odis Luster, FNP 09/21/20 1451

## 2020-09-21 NOTE — Discharge Instructions (Addendum)
You have been prescribed doxycycline antibiotic for treatment of right toe infection.  I am unable to prescribe ibuprofen at this time due to interaction with your current daily prescription medicines.  May take Tylenol as needed for pain.  I highly suggest that you go to the hospital for further evaluation but since you have declined the option, please follow-up with the specialists listed below.  Please call provided contact information for podiatry today further evaluation and management of right toe infection and ulcer.  Please call provided contact information for orthopedist today for further evaluation and management of right foot fracture.

## 2020-09-22 ENCOUNTER — Other Ambulatory Visit (HOSPITAL_COMMUNITY): Payer: Self-pay

## 2020-09-22 ENCOUNTER — Telehealth: Payer: Self-pay | Admitting: Podiatry

## 2020-09-22 ENCOUNTER — Ambulatory Visit: Payer: Medicaid Other | Admitting: Podiatry

## 2020-09-22 DIAGNOSIS — L97512 Non-pressure chronic ulcer of other part of right foot with fat layer exposed: Secondary | ICD-10-CM

## 2020-09-22 DIAGNOSIS — E1151 Type 2 diabetes mellitus with diabetic peripheral angiopathy without gangrene: Secondary | ICD-10-CM

## 2020-09-22 DIAGNOSIS — N471 Phimosis: Secondary | ICD-10-CM | POA: Diagnosis not present

## 2020-09-22 DIAGNOSIS — I999 Unspecified disorder of circulatory system: Secondary | ICD-10-CM

## 2020-09-22 DIAGNOSIS — N476 Balanoposthitis: Secondary | ICD-10-CM | POA: Diagnosis not present

## 2020-09-22 MED ORDER — DOXYCYCLINE HYCLATE 100 MG PO TABS
100.0000 mg | ORAL_TABLET | Freq: Two times a day (BID) | ORAL | 0 refills | Status: DC
  Filled 2020-09-22: qty 20, 10d supply, fill #0

## 2020-09-22 MED ORDER — DOXYCYCLINE HYCLATE 100 MG PO TABS: 100.0000 mg | ORAL_TABLET | Freq: Two times a day (BID) | ORAL | 0 refills | Status: DC

## 2020-09-22 MED ORDER — DOXYCYCLINE HYCLATE 100 MG PO CAPS: 100.0000 mg | ORAL_CAPSULE | Freq: Two times a day (BID) | ORAL | 0 refills | Status: DC

## 2020-09-22 NOTE — Telephone Encounter (Signed)
Please send meds to correct pharmacy. Chart has been update

## 2020-09-23 NOTE — Progress Notes (Signed)
Subjective:  Patient ID: Jon Jones, male    DOB: [birth year],  MRN: [id number]  Chief Complaint  Patient presents with   Foot Ulcer    Right foot 2nd toe ulcer     65 y.o. male presents for wound care.  Patient presents with complaint of right second digit ulceration with fat layer exposed.  Patient states that he has been going for 2 days has progressive gotten worse.  He states that he is diabetic with last A1c of 9.8.  He has not seen anyone else prior to seeing me.  He already had a surgical shoe for which she put himself in it.  He denies any other acute complaints.  He has not been doing any dressing changes.  No pain associated with it.   Review of Systems: Negative except as noted in the HPI. Denies N/V/F/Ch.  Past Medical History:  Diagnosis Date   5 years ago    AICD (automatic cardioverter/defibrillator) present    Anxiety    Arthritis    Cataract    CHF (congestive heart failure) ([name])    Coronary artery disease    Diabetes mellitus without complication ([name])    Diabetic retinopathy ([place])    Dysrhythmia    GERD (gastroesophageal reflux disease)    PMH   Hypertension    Hypertensive retinopathy    Peripheral vascular disease ([name])    Peripheral vascular disease ([name])    Pneumonia    Shoulder impingement, right    Sleep apnea    wears cpap   Stomach ulcer    Wears glasses     Current Outpatient Medications:    doxycycline (VIBRA-TABS) 100 MG tablet, Take 1 tablet (100 mg total) by mouth 2 (two) times daily for 10 days., Disp: 20 tablet, Rfl: 0   Accu-Chek Softclix Lancets lancets, CHECK BLOOD SUGAR ONCE DAILY, Disp: 100 each, Rfl: 2   aspirin 81 MG EC tablet, Take 81 mg by mouth daily., Disp: , Rfl:    atorvastatin (LIPITOR) 80 MG tablet, Take 1 tablet (80 mg total) by mouth every evening., Disp: 90 tablet, Rfl: 3   Blood Glucose Monitoring Suppl (ACCU-CHEK GUIDE ME) w/Device KIT, Check blood sugar once daily., Disp: 1 kit, Rfl: 0   ciclopirox (PENLAC) 8 %  solution, Apply topically at bedtime. Apply over nail and surrounding skin. Apply daily over previous coat. After seven (7) days, may remove with alcohol and continue cycle., Disp: 6.6 mL, Rfl: 3   clobetasol ointment (TEMOVATE) 3.56 %, Apply 1 application topically 2 (two) times daily as needed., Disp: 30 g, Rfl: 0   clopidogrel (PLAVIX) 75 MG tablet, Take 75 mg by mouth daily., Disp: , Rfl:    doxycycline (VIBRAMYCIN) 100 MG capsule, Take 1 capsule (100 mg total) by mouth 2 (two) times daily for 10 days., Disp: 20 capsule, Rfl: 0   empagliflozin (JARDIANCE) 25 MG TABS tablet, Take 1 tablet (25 mg total) by mouth at bedtime., Disp: 90 tablet, Rfl: 2   gabapentin (NEURONTIN) 300 MG capsule, Take 1 capsule (300 mg total) by mouth at bedtime., Disp: 30 capsule, Rfl: 5   glipiZIDE (GLUCOTROL) 10 MG tablet, Take 2 tablets (20 mg total) by mouth 2 (two) times daily., Disp: 360 tablet, Rfl: 2   glucose blood (ACCU-CHEK AVIVA PLUS) test strip, Test BS 3x/day with meals, Disp: 100 each, Rfl: 3   metFORMIN (GLUCOPHAGE) 1000 MG tablet, Take 1 tablet (1,000 mg total) by mouth 2 (two) times daily with a meal., Disp: 180  tablet, Rfl: 2   metoprolol succinate (TOPROL-XL) 100 MG 24 hr tablet, Take 1 tablet (100 mg total) by mouth daily., Disp: 90 tablet, Rfl: 3   Misc. Devices MISC, Cpap supplies-tubing mask Dx G47.33 and Z99.89, Disp: 1 Device, Rfl: 0   nitroGLYCERIN (NITROSTAT) 0.4 MG SL tablet, Place 0.4 mg under the tongue See admin instructions. Every 5 minutes as needed for chest pain. Max of 3 doses, Disp: , Rfl:    nystatin cream (MYCOSTATIN), SMARTSIG:Sparingly Topical 2-3 Times Daily PRN, Disp: , Rfl:    PROAIR HFA 108 (90 Base) MCG/ACT inhaler, INHALE 2 PUFFS INTO THE LUNGS EVERY 6 HOURS AS NEEDED FOR WHEEZING OR SHORTNESS OF BREATH, Disp: 8.5 g, Rfl: 0   sacubitril-valsartan (ENTRESTO) 24-26 MG, Take 1 tablet by mouth 2 (two) times daily., Disp: 60 tablet, Rfl: 11   sitaGLIPtin (JANUVIA) 100 MG tablet,  Take 1 tablet (100 mg total) by mouth daily., Disp: 90 tablet, Rfl: 2   spironolactone (ALDACTONE) 25 MG tablet, Take 0.5 tablets (12.5 mg total) by mouth daily., Disp: 45 tablet, Rfl: 3   triamcinolone (KENALOG) 0.1 %, SMARTSIG:Sparingly Topical Twice Daily, Disp: , Rfl:   Social History   Tobacco Use  Smoking Status Former   Packs/day: 1.00   Years: 40.00   Pack years: 40.00   Types: Cigarettes   Quit date: 03/11/2019   Years since quitting: 1.5  Smokeless Tobacco Never    No Known Allergies Objective:  There were no vitals filed for this visit. There is no height or weight on file to calculate BMI. Constitutional Well developed. Well nourished.  Vascular Dorsalis pedis pulses nonpalpable bilaterally. Posterior tibial pulses non palpable bilaterally. Capillary refill n diminished o all digits.  No cyanosis or clubbing noted. Pedal hair growth none  Neurologic Normal speech. Oriented to person, place, and time. Protective sensation absent  Dermatologic Wound Location: Right second digit with fat layer exposed.  Does not probe down to bone.  Probes to deep tissue.  No purulent drainage noted no malodor present Wound Base: Mixed Granular/Fibrotic Peri-wound: Calloused Exudate: Scant/small amount Serosanguinous exudate Wound Measurements: -See below  Orthopedic: No pain to palpation either foot.   Radiographs: Prior x-rays were read: No osteomyelitic changes noted.  No soft tissue emphysema noted.  No signs of osteomyelitis noted.  No cortical irregularity noted. Assessment:   1. Vascular abnormality   2. Toe ulcer, right, with fat layer exposed ([place])   3. Type 2 diabetes mellitus with diabetic peripheral angiopathy without gangrene, without long-term current use of insulin ([place])    Plan:  Patient was evaluated and treated and all questions answered.  Ulcer right second digit with fat layer exposed -Debridement as below. -Dressed with Betadine wet-to-dry,  DSD. -Continue off-loading with surgical shoe. -Doxy was sent to the pharmacy for skin and soft tissue prophylaxis  Vascular abnormality -I explained to patient the etiology of vascular abnormality various treatment options were discussed.  Clinically [place] to palpate his pulses therefore I believe patient will benefit from ABIs PVRs and vascular work-up.  Patient states understand will obtain them right away.  Procedure: Excisional Debridement of Wound Tool: Sharp chisel blade/tissue nipper Rationale: Removal of non-viable soft tissue from the wound to promote healing.  Anesthesia: none Pre-Debridement Wound Measurements: 0.2 cm x 0.4 cm x 0.3 cm  Post-Debridement Wound Measurements: 0.3 cm x 0.6 cm x 0.4 cm  Type of Debridement: Sharp Excisional Tissue Removed: Non-viable soft tissue Blood loss: Minimal (<50cc) Depth of Debridement: subcutaneous tissue.  Technique: Sharp excisional debridement to bleeding, viable wound base.  Wound Progress: This is my initial evaluation I will plan to continue monitor the progression of it. Site healing conversation 7 Dressing: Dry, sterile, compression dressing. Disposition: Patient tolerated procedure well. Patient to return in 1 week for follow-up.  No follow-ups on file.

## 2020-09-28 ENCOUNTER — Encounter: Payer: Self-pay | Admitting: Podiatry

## 2020-09-29 ENCOUNTER — Encounter: Payer: Self-pay | Admitting: Podiatry

## 2020-09-29 ENCOUNTER — Emergency Department (HOSPITAL_COMMUNITY): Payer: Medicare Other

## 2020-09-29 ENCOUNTER — Other Ambulatory Visit: Payer: Self-pay

## 2020-09-29 ENCOUNTER — Inpatient Hospital Stay (HOSPITAL_COMMUNITY)
Admission: EM | Admit: 2020-09-29 | Discharge: 2020-10-01 | DRG: 256 | Disposition: A | Discharge status: Home / Self Care | Payer: Medicare Other | Attending: Family Medicine | Admitting: Family Medicine

## 2020-09-29 ENCOUNTER — Encounter (HOSPITAL_COMMUNITY): Payer: Self-pay | Admitting: Emergency Medicine

## 2020-09-29 ENCOUNTER — Ambulatory Visit: Payer: Medicaid Other | Admitting: Podiatry

## 2020-09-29 ENCOUNTER — Ambulatory Visit (INDEPENDENT_AMBULATORY_CARE_PROVIDER_SITE_OTHER): Payer: Medicaid Other

## 2020-09-29 DIAGNOSIS — I252 Old myocardial infarction: Secondary | ICD-10-CM

## 2020-09-29 DIAGNOSIS — M86171 Other acute osteomyelitis, right ankle and foot: Secondary | ICD-10-CM | POA: Diagnosis not present

## 2020-09-29 DIAGNOSIS — Z7902 Long term (current) use of antithrombotics/antiplatelets: Secondary | ICD-10-CM

## 2020-09-29 DIAGNOSIS — I96 Gangrene, not elsewhere classified: Secondary | ICD-10-CM | POA: Diagnosis not present

## 2020-09-29 DIAGNOSIS — E872 Acidosis: Secondary | ICD-10-CM | POA: Diagnosis not present

## 2020-09-29 DIAGNOSIS — I5022 Chronic systolic (congestive) heart failure: Secondary | ICD-10-CM | POA: Diagnosis not present

## 2020-09-29 DIAGNOSIS — R739 Hyperglycemia, unspecified: Secondary | ICD-10-CM

## 2020-09-29 DIAGNOSIS — R809 Proteinuria, unspecified: Secondary | ICD-10-CM | POA: Diagnosis not present

## 2020-09-29 DIAGNOSIS — M868X7 Other osteomyelitis, ankle and foot: Secondary | ICD-10-CM | POA: Diagnosis not present

## 2020-09-29 DIAGNOSIS — E1152 Type 2 diabetes mellitus with diabetic peripheral angiopathy with gangrene: Principal | ICD-10-CM | POA: Diagnosis present

## 2020-09-29 DIAGNOSIS — L03031 Cellulitis of right toe: Secondary | ICD-10-CM | POA: Diagnosis not present

## 2020-09-29 DIAGNOSIS — Z20822 Contact with and (suspected) exposure to covid-19: Secondary | ICD-10-CM | POA: Diagnosis present

## 2020-09-29 DIAGNOSIS — K219 Gastro-esophageal reflux disease without esophagitis: Secondary | ICD-10-CM | POA: Diagnosis present

## 2020-09-29 DIAGNOSIS — L97512 Non-pressure chronic ulcer of other part of right foot with fat layer exposed: Secondary | ICD-10-CM

## 2020-09-29 DIAGNOSIS — L97519 Non-pressure chronic ulcer of other part of right foot with unspecified severity: Secondary | ICD-10-CM | POA: Diagnosis not present

## 2020-09-29 DIAGNOSIS — E11621 Type 2 diabetes mellitus with foot ulcer: Secondary | ICD-10-CM | POA: Diagnosis present

## 2020-09-29 DIAGNOSIS — Z6838 Body mass index (BMI) 38.0-38.9, adult: Secondary | ICD-10-CM

## 2020-09-29 DIAGNOSIS — M1A0221 Idiopathic chronic gout, left elbow, with tophus (tophi): Secondary | ICD-10-CM | POA: Diagnosis not present

## 2020-09-29 DIAGNOSIS — Z7982 Long term (current) use of aspirin: Secondary | ICD-10-CM

## 2020-09-29 DIAGNOSIS — L0390 Cellulitis, unspecified: Secondary | ICD-10-CM | POA: Diagnosis present

## 2020-09-29 DIAGNOSIS — E1142 Type 2 diabetes mellitus with diabetic polyneuropathy: Secondary | ICD-10-CM | POA: Diagnosis present

## 2020-09-29 DIAGNOSIS — Z833 Family history of diabetes mellitus: Secondary | ICD-10-CM

## 2020-09-29 DIAGNOSIS — Z950 Presence of cardiac pacemaker: Secondary | ICD-10-CM

## 2020-09-29 DIAGNOSIS — Z95810 Presence of automatic (implantable) cardiac defibrillator: Secondary | ICD-10-CM

## 2020-09-29 DIAGNOSIS — E1169 Type 2 diabetes mellitus with other specified complication: Secondary | ICD-10-CM | POA: Diagnosis present

## 2020-09-29 DIAGNOSIS — Z87891 Personal history of nicotine dependence: Secondary | ICD-10-CM

## 2020-09-29 DIAGNOSIS — E11628 Type 2 diabetes mellitus with other skin complications: Secondary | ICD-10-CM | POA: Diagnosis not present

## 2020-09-29 DIAGNOSIS — L089 Local infection of the skin and subcutaneous tissue, unspecified: Secondary | ICD-10-CM

## 2020-09-29 DIAGNOSIS — Z89421 Acquired absence of other right toe(s): Secondary | ICD-10-CM

## 2020-09-29 DIAGNOSIS — E785 Hyperlipidemia, unspecified: Secondary | ICD-10-CM | POA: Diagnosis present

## 2020-09-29 DIAGNOSIS — Z8249 Family history of ischemic heart disease and other diseases of the circulatory system: Secondary | ICD-10-CM | POA: Diagnosis not present

## 2020-09-29 DIAGNOSIS — I110 Hypertensive heart disease with heart failure: Secondary | ICD-10-CM | POA: Diagnosis present

## 2020-09-29 DIAGNOSIS — E1165 Type 2 diabetes mellitus with hyperglycemia: Secondary | ICD-10-CM | POA: Diagnosis present

## 2020-09-29 DIAGNOSIS — I2510 Atherosclerotic heart disease of native coronary artery without angina pectoris: Secondary | ICD-10-CM | POA: Diagnosis not present

## 2020-09-29 DIAGNOSIS — E1151 Type 2 diabetes mellitus with diabetic peripheral angiopathy without gangrene: Secondary | ICD-10-CM | POA: Diagnosis not present

## 2020-09-29 DIAGNOSIS — Z7984 Long term (current) use of oral hypoglycemic drugs: Secondary | ICD-10-CM

## 2020-09-29 DIAGNOSIS — G4733 Obstructive sleep apnea (adult) (pediatric): Secondary | ICD-10-CM | POA: Diagnosis present

## 2020-09-29 DIAGNOSIS — R7989 Other specified abnormal findings of blood chemistry: Secondary | ICD-10-CM | POA: Diagnosis not present

## 2020-09-29 DIAGNOSIS — M86671 Other chronic osteomyelitis, right ankle and foot: Secondary | ICD-10-CM | POA: Diagnosis not present

## 2020-09-29 DIAGNOSIS — Z79899 Other long term (current) drug therapy: Secondary | ICD-10-CM

## 2020-09-29 LAB — CBC WITH DIFFERENTIAL/PLATELET
Abs Immature Granulocytes: 0.04 10*3/uL (ref 0.00–0.07)
Basophils Absolute: 0 10*3/uL (ref 0.0–0.1)
Basophils Relative: 0 %
Eosinophils Absolute: 0 10*3/uL (ref 0.0–0.5)
Eosinophils Relative: 0 %
HCT: 43.3 % (ref 39.0–52.0)
Hemoglobin: 13.9 g/dL (ref 13.0–17.0)
Immature Granulocytes: 0 %
Lymphocytes Relative: 19 %
Lymphs Abs: 1.8 10*3/uL (ref 0.7–4.0)
MCH: 30.5 pg (ref 26.0–34.0)
MCHC: 32.1 g/dL (ref 30.0–36.0)
MCV: 95 fL (ref 80.0–100.0)
Monocytes Absolute: 0.7 10*3/uL (ref 0.1–1.0)
Monocytes Relative: 7 %
Neutro Abs: 6.9 10*3/uL (ref 1.7–7.7)
Neutrophils Relative %: 74 %
Platelets: 217 10*3/uL (ref 150–400)
RBC: 4.56 MIL/uL (ref 4.22–5.81)
RDW: 14.1 % (ref 11.5–15.5)
WBC: 9.6 10*3/uL (ref 4.0–10.5)
nRBC: 0 % (ref 0.0–0.2)

## 2020-09-29 LAB — HIV ANTIBODY (ROUTINE TESTING W REFLEX): HIV Screen 4th Generation wRfx: NONREACTIVE

## 2020-09-29 LAB — COMPREHENSIVE METABOLIC PANEL
ALT: 22 U/L (ref 0–44)
AST: 26 U/L (ref 15–41)
Albumin: 3.5 g/dL (ref 3.5–5.0)
Alkaline Phosphatase: 59 U/L (ref 38–126)
Anion gap: 8 (ref 5–15)
BUN: 18 mg/dL (ref 8–23)
CO2: 23 mmol/L (ref 22–32)
Calcium: 9.7 mg/dL (ref 8.9–10.3)
Chloride: 103 mmol/L (ref 98–111)
Creatinine, Ser: 1.28 mg/dL — ABNORMAL HIGH (ref 0.61–1.24)
GFR, Estimated: >60 mL/min (ref >60)
Glucose, Bld: 345 mg/dL — ABNORMAL HIGH (ref 70–99)
Potassium: 4.5 mmol/L (ref 3.5–5.1)
Sodium: 134 mmol/L — ABNORMAL LOW (ref 135–145)
Total Bilirubin: 0.6 mg/dL (ref 0.3–1.2)
Total Protein: 7.4 g/dL (ref 6.5–8.1)

## 2020-09-29 LAB — LACTIC ACID, PLASMA
Lactic Acid, Venous: 2.6 mmol/L (ref 0.5–1.9)
Lactic Acid, Venous: 3.1 mmol/L (ref 0.5–1.9)
Lactic Acid, Venous: 3.5 mmol/L (ref 0.5–1.9)
Lactic Acid, Venous: 4.7 mmol/L (ref 0.5–1.9)
Lactic Acid, Venous: 1.5 mmol/L (ref 0.5–1.9)

## 2020-09-29 LAB — CBG MONITORING, ED: Glucose-Capillary: 79 mg/dL (ref 70–99)

## 2020-09-29 MED ORDER — SODIUM CHLORIDE 0.9 % IV BOLUS
1000.0000 mL | Freq: Once | INTRAVENOUS | Status: AC
  Administered 2020-09-29: 1000 mL via INTRAVENOUS

## 2020-09-29 MED ORDER — GABAPENTIN 300 MG PO CAPS
300.0000 mg | ORAL_CAPSULE | Freq: Every day | ORAL | Status: DC
  Administered 2020-09-29 – 2020-09-30 (×2): 300 mg via ORAL
  Filled 2020-09-29 (×2): qty 1

## 2020-09-29 MED ORDER — INSULIN ASPART 100 UNIT/ML IJ SOLN
0.0000 [IU] | Freq: Three times a day (TID) | INTRAMUSCULAR | Status: DC
  Administered 2020-09-30: 3 [IU] via SUBCUTANEOUS
  Administered 2020-09-30: 1 [IU] via SUBCUTANEOUS
  Administered 2020-09-30: 2 [IU] via SUBCUTANEOUS
  Administered 2020-10-01: 5 [IU] via SUBCUTANEOUS
  Administered 2020-10-01 (×2): 2 [IU] via SUBCUTANEOUS

## 2020-09-29 MED ORDER — CYCLOSPORINE 0.05 % OP EMUL
1.0000 [drp] | Freq: Every evening | OPHTHALMIC | Status: DC | PRN
  Filled 2020-09-29: qty 1

## 2020-09-29 MED ORDER — INSULIN ASPART 100 UNIT/ML IJ SOLN
6.0000 [IU] | Freq: Once | INTRAMUSCULAR | Status: AC
  Administered 2020-09-29: 6 [IU] via SUBCUTANEOUS

## 2020-09-29 MED ORDER — METOPROLOL SUCCINATE ER 100 MG PO TB24
100.0000 mg | ORAL_TABLET | Freq: Every day | ORAL | Status: DC
  Administered 2020-09-29 – 2020-10-01 (×3): 100 mg via ORAL
  Filled 2020-09-29 (×3): qty 1

## 2020-09-29 MED ORDER — VANCOMYCIN HCL 1250 MG/250ML IV SOLN
1250.0000 mg | INTRAVENOUS | Status: DC
  Administered 2020-10-01: 1250 mg via INTRAVENOUS
  Filled 2020-09-29: qty 250

## 2020-09-29 MED ORDER — CLOPIDOGREL BISULFATE 75 MG PO TABS
75.0000 mg | ORAL_TABLET | Freq: Every day | ORAL | Status: DC
  Administered 2020-09-29 – 2020-10-01 (×3): 75 mg via ORAL
  Filled 2020-09-29 (×3): qty 1

## 2020-09-29 MED ORDER — SACUBITRIL-VALSARTAN 24-26 MG PO TABS
1.0000 | ORAL_TABLET | Freq: Two times a day (BID) | ORAL | Status: DC
  Administered 2020-09-29 – 2020-10-01 (×4): 1 via ORAL
  Filled 2020-09-29 (×6): qty 1

## 2020-09-29 MED ORDER — SPIRONOLACTONE 12.5 MG HALF TABLET
12.5000 mg | ORAL_TABLET | Freq: Every day | ORAL | Status: DC
  Administered 2020-09-30 – 2020-10-01 (×2): 12.5 mg via ORAL
  Filled 2020-09-29 (×2): qty 1

## 2020-09-29 MED ORDER — HEPARIN SODIUM (PORCINE) 5000 UNIT/ML IJ SOLN
5000.0000 [IU] | Freq: Three times a day (TID) | INTRAMUSCULAR | Status: DC
  Administered 2020-09-30 (×4): 5000 [IU] via SUBCUTANEOUS
  Filled 2020-09-29 (×5): qty 1

## 2020-09-29 MED ORDER — LACTATED RINGERS IV BOLUS
30.0000 mL/kg | Freq: Once | INTRAVENOUS | Status: AC
  Administered 2020-09-29: 3240 mL via INTRAVENOUS

## 2020-09-29 MED ORDER — ACETAMINOPHEN 325 MG PO TABS: 650.0000 mg | ORAL_TABLET | Freq: Four times a day (QID) | ORAL | Status: DC | PRN

## 2020-09-29 MED ORDER — ASPIRIN EC 81 MG PO TBEC
81.0000 mg | DELAYED_RELEASE_TABLET | Freq: Every day | ORAL | Status: DC
  Administered 2020-09-30 – 2020-10-01 (×2): 81 mg via ORAL
  Filled 2020-09-29 (×2): qty 1

## 2020-09-29 MED ORDER — LINAGLIPTIN 5 MG PO TABS
5.0000 mg | ORAL_TABLET | Freq: Every day | ORAL | Status: DC
  Administered 2020-09-30 – 2020-10-01 (×2): 5 mg via ORAL
  Filled 2020-09-29 (×2): qty 1

## 2020-09-29 MED ORDER — VANCOMYCIN HCL 2000 MG/400ML IV SOLN
2000.0000 mg | Freq: Once | INTRAVENOUS | Status: AC
  Administered 2020-09-30: 2000 mg via INTRAVENOUS
  Filled 2020-09-29 (×2): qty 400

## 2020-09-29 MED ORDER — PIPERACILLIN-TAZOBACTAM 3.375 G IVPB 30 MIN
3.3750 g | Freq: Once | INTRAVENOUS | Status: AC
  Administered 2020-09-29: 3.375 g via INTRAVENOUS
  Filled 2020-09-29: qty 50

## 2020-09-29 MED ORDER — ATORVASTATIN CALCIUM 80 MG PO TABS
80.0000 mg | ORAL_TABLET | Freq: Every evening | ORAL | Status: DC
  Administered 2020-09-29 – 2020-09-30 (×2): 80 mg via ORAL
  Filled 2020-09-29: qty 1
  Filled 2020-09-29: qty 2

## 2020-09-29 MED ORDER — ACETAMINOPHEN 650 MG RE SUPP: 650.0000 mg | Freq: Four times a day (QID) | RECTAL | Status: DC | PRN

## 2020-09-29 MED ORDER — ALBUTEROL SULFATE (2.5 MG/3ML) 0.083% IN NEBU: 3.0000 mL | INHALATION_SOLUTION | Freq: Four times a day (QID) | RESPIRATORY_TRACT | Status: DC | PRN

## 2020-09-29 MED ORDER — NITROGLYCERIN 0.4 MG SL SUBL
0.4000 mg | SUBLINGUAL_TABLET | SUBLINGUAL | Status: DC | PRN
Start: 2020-09-29 — End: 2020-10-01

## 2020-09-29 NOTE — Progress Notes (Addendum)
FPTS Brief Progress Note  S: Laying in bed comfortably watching TV. No acute concerns at this time.  Denies foot pain.   O: BP 135/62   Pulse 91   Temp 98.1 F (36.7 C) (Oral)   Resp (!) 21   Wt 108 kg   SpO2 99%   BMI 38.43 kg/m    General: Alert, no acute distress, pleasant Cardio: Well-perfused Pulm: normal work of breathing Extremities: No peripheral edema, necrotic second right toe Neuro: Cranial nerves grossly intact   A/P: Second toe osteomyelitis  -Podiatry following, plans for amputation later this week -MRI foot tomorrow  -Discuss with CCM regarding rising lactic acid  - Orders reviewed. Labs for AM ordered, which was adjusted as needed.   Towanda Octave, MD 09/29/2020, 7:55 PM PGY-3, [facility] Night Resident  Please page [phone] with questions.

## 2020-09-29 NOTE — Progress Notes (Signed)
Spoke with CCM attending this evening regarding lactic acidosis and rising lactic acid levels this evening.  He explained that the lactic acidosis is most likely due to poorly controlled diabetes.  Other causes could be hypotension and drug-induced however less likely in this patient.  Patient's last A1c on 09/08/2020 was 9.8.  He recommended adequate glucose control, avoiding hypotension, ensuring patient is well-hydrated, inserting Foley catheter, strict I's and O's and aim for urine output of 100c/hr.  He recommended against further trending of lactic acid levels.  Towanda Octave MD PGY-3, [facility]

## 2020-09-29 NOTE — ED Provider Notes (Addendum)
[facility] EMERGENCY DEPARTMENT Provider Note   CSN: [id number] Arrival date & time: 09/29/20  3295     History Chief Complaint  Patient presents with   Wound Check    Valente Fosberg is a 65 y.o. male.  Patient w hx dm, c/o right 2nd toe swelling, redness, and drainage. Symptoms acute onset in past week - states occurred after using topical antifungal - pt unsure if related. Symptoms acute onset, moderate, constant, persistent, worsening. Has been seen by urgent care and podiatry/wound  care x 2, and has been on doxy as ouptpt. Was advised by doctor to come to ED for admission today. Remote hx left small toe infection/amputations. +swelling/redness to dorsum of foot. No fever/chills.   The history is provided by the patient and medical records.  Wound Check Pertinent negatives include no chest pain, no abdominal pain, no headaches and no shortness of breath.      Past Medical History:  Diagnosis Date   5 years ago    AICD (automatic cardioverter/defibrillator) present    Anxiety    Arthritis    Cataract    CHF (congestive heart failure) ([name])    Coronary artery disease    Diabetes mellitus without complication ([name])    Diabetic retinopathy ([place])    Dysrhythmia    GERD (gastroesophageal reflux disease)    PMH   Hypertension    Hypertensive retinopathy    Peripheral vascular disease ([name])    Peripheral vascular disease ([name])    Pneumonia    Shoulder impingement, right    Sleep apnea    wears cpap   Stomach ulcer    Wears glasses     Patient Active Problem List   Diagnosis Date Noted   Chronic combined systolic and diastolic heart failure ([place]) 09/15/2020   Onychomycosis of toenail 08/24/2020   Diabetes mellitus ([place]) 07/23/2020   Type 2 diabetes mellitus with hyperglycemia, without long-term current use of insulin ([place]) 07/23/2020   Weight gain 07/23/2020   Type 2 diabetes mellitus with peripheral neuropathy ([place]) 07/23/2020   Microalbuminuria  due to type 2 diabetes mellitus ([place]) 05/04/2020   Hypertensive heart disease with heart failure ([place]) 04/26/2020   Type 2 diabetes mellitus with diabetic peripheral angiopathy without gangrene, without long-term current use of insulin ([place]) 04/26/2020   OSA on CPAP 04/26/2020   PAD (peripheral artery disease) ([place]) 04/26/2020   CAD in native artery 04/26/2020   Class 2 severe obesity due to excess calories with serious comorbidity and body mass index (BMI) of 38.0 to 38.9 in adult [facility]) 04/26/2020   Impingement syndrome of right shoulder    Nontraumatic complete tear of right rotator cuff    Type 2 diabetes mellitus with foot ulcer, without long-term current use of insulin ([name])    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   S/P debridement 07/19/2016   Acute gout of left elbow 07/19/2016    Past Surgical History:  Procedure Laterality Date   AMPUTATION Left 03/12/2019   Procedure: 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;  Surgeon: [name], MD;  Location: [place];  Service: Orthopedics;  Laterality: Left;   CARDIAC CATHETERIZATION     CARDIAC DEFIBRILLATOR PLACEMENT     I & D EXTREMITY Left 07/19/2016   Procedure: IRRIGATION AND DEBRIDEMENT EXTREMITY/OLECRANON(WASHOUT);  Surgeon: Leandrew Koyanagi, MD;  Location: [place];  Service: Orthopedics;  Laterality: Left;   OLECRANON BURSECTOMY Left 07/19/2016   Procedure: LEFT ELBOW OLECRANON BURSECTOMY;  Surgeon: Frankey Shown  M, MD;  Location: [place];  Service: Orthopedics;  Laterality: Left;   SHOULDER ARTHROSCOPY Right 05/16/2019   Procedure: Right Shoulder Arthroscopy;  Surgeon: [name], MD;  Location: [place];  Service: Orthopedics;  Laterality: Right;       Family History  Problem Relation Age of Onset   Heart disease Mother    Diabetes Mother    Peripheral vascular disease Mother     Social History   Tobacco Use   Smoking status: Former    Packs/day: 1.00    Years: 40.00    Pack years: 40.00    Types:  Cigarettes    Quit date: 03/11/2019    Years since quitting: 1.5   Smokeless tobacco: Never  Vaping Use   Vaping Use: Never used  Substance Use Topics   Alcohol use: Not Currently   Drug use: Yes    Types: Oxycodone    Home Medications Prior to Admission medications   Medication Sig Start Date End Date Taking? Authorizing Provider  Accu-Chek Softclix Lancets lancets CHECK BLOOD SUGAR ONCE DAILY 08/12/20   Ladell Pier, MD  aspirin 81 MG EC tablet Take 81 mg by mouth daily. 09/12/18   [provider]  atorvastatin (LIPITOR) 80 MG tablet Take 1 tablet (80 mg total) by mouth every evening. 09/15/20   Donato Heinz, MD  Blood Glucose Monitoring Suppl (ACCU-CHEK GUIDE ME) w/Device KIT Check blood sugar once daily. 04/30/20   Ladell Pier, MD  ciclopirox (PENLAC) 8 % solution Apply topically at bedtime. Apply over nail and surrounding skin. Apply daily over previous coat. After seven (7) days, may remove with alcohol and continue cycle. 08/24/20   Ladell Pier, MD  clobetasol ointment (TEMOVATE) 7.34 % Apply 1 application topically 2 (two) times daily as needed. 04/30/20   Ladell Pier, MD  clopidogrel (PLAVIX) 75 MG tablet Take 75 mg by mouth daily. 12/25/18   [provider]  doxycycline (VIBRA-TABS) 100 MG tablet Take 1 tablet (100 mg total) by mouth 2 (two) times daily for 10 days. 09/22/20 10/02/20  Felipa Furnace, DPM  doxycycline (VIBRAMYCIN) 100 MG capsule Take 1 capsule (100 mg total) by mouth 2 (two) times daily for 10 days. 09/22/20 10/02/20  Felipa Furnace, DPM  empagliflozin (JARDIANCE) 25 MG TABS tablet Take 1 tablet (25 mg total) by mouth at bedtime. 07/23/20   Shamleffer, Melanie Crazier, MD  gabapentin (NEURONTIN) 300 MG capsule Take 1 capsule (300 mg total) by mouth at bedtime. 04/26/20   Ladell Pier, MD  glipiZIDE (GLUCOTROL) 10 MG tablet Take 2 tablets (20 mg total) by mouth 2 (two) times daily. 07/23/20   Shamleffer, Melanie Crazier,  MD  glucose blood (ACCU-CHEK AVIVA PLUS) test strip Test BS 3x/day with meals 08/24/20   Ladell Pier, MD  metFORMIN (GLUCOPHAGE) 1000 MG tablet Take 1 tablet (1,000 mg total) by mouth 2 (two) times daily with a meal. 07/23/20   Shamleffer, Melanie Crazier, MD  metoprolol succinate (TOPROL-XL) 100 MG 24 hr tablet Take 1 tablet (100 mg total) by mouth daily. 09/15/20   Donato Heinz, MD  Misc. Devices MISC Cpap supplies-tubing mask Dx G47.33 and Z99.89 07/20/20   Ladell Pier, MD  nitroGLYCERIN (NITROSTAT) 0.4 MG SL tablet Place 0.4 mg under the tongue See admin instructions. Every 5 minutes as needed for chest pain. Max of 3 doses 04/03/16   [provider]  nystatin cream (MYCOSTATIN) SMARTSIG:Sparingly Topical 2-3 Times Daily  PRN 05/10/20   [provider]  PROAIR HFA 108 (90 Base) MCG/ACT inhaler INHALE 2 PUFFS INTO THE LUNGS EVERY 6 HOURS AS NEEDED FOR WHEEZING OR SHORTNESS OF BREATH 09/16/20   Ladell Pier, MD  sacubitril-valsartan (ENTRESTO) 24-26 MG Take 1 tablet by mouth 2 (two) times daily. 09/10/20   Donato Heinz, MD  sitaGLIPtin (JANUVIA) 100 MG tablet Take 1 tablet (100 mg total) by mouth daily. 07/23/20   Shamleffer, Melanie Crazier, MD  spironolactone (ALDACTONE) 25 MG tablet Take 0.5 tablets (12.5 mg total) by mouth daily. 09/15/20   Donato Heinz, MD  triamcinolone (KENALOG) 0.1 % SMARTSIG:Sparingly Topical Twice Daily 05/10/20   [provider]    Allergies    Patient has no known allergies.  Review of Systems   Review of Systems  Constitutional:  Negative for chills and fever.  HENT:  Negative for sore throat.   Eyes:  Negative for redness.  Respiratory:  Negative for shortness of breath.   Cardiovascular:  Negative for chest pain.  Gastrointestinal:  Negative for abdominal pain.  Genitourinary:  Negative for flank pain.  Musculoskeletal:  Negative for back pain and neck pain.  Skin:  Negative for rash.   Neurological:  Negative for headaches.  Hematological:  Does not bruise/bleed easily.  Psychiatric/Behavioral:  Negative for confusion.    Physical Exam Updated Vital Signs BP 118/71   Pulse 77   Temp 98.1 F (36.7 C) (Oral)   Resp 19   SpO2 98%   Physical Exam Vitals and nursing note reviewed.  Constitutional:      Appearance: Normal appearance. He is well-developed.  HENT:     Head: Atraumatic.     Nose: Nose normal.     Mouth/Throat:     Mouth: Mucous membranes are moist.  Eyes:     General: No scleral icterus.    Conjunctiva/sclera: Conjunctivae normal.  Neck:     Trachea: No tracheal deviation.  Cardiovascular:     Rate and Rhythm: Normal rate and regular rhythm.     Pulses: Normal pulses.     Heart sounds: Normal heart sounds. No murmur heard.   No friction rub. No gallop.  Pulmonary:     Effort: Pulmonary effort is normal. No accessory muscle usage or respiratory distress.     Breath sounds: Normal breath sounds.  Abdominal:     General: There is no distension.     Palpations: Abdomen is soft.     Tenderness: There is no abdominal tenderness.  Genitourinary:    Comments: No cva tenderness. Musculoskeletal:     Cervical back: Normal range of motion and neck supple. No rigidity.     Comments: Pt with erythema, swelling, open wound, and drainage left 2nd toe (see image). Foot w mild swelling and erythema. Distal pulses are palpable. No crepitus.   Skin:    General: Skin is warm and dry.     Findings: No rash.  Neurological:     Mental Status: He is alert.     Comments: Alert, speech clear.   Psychiatric:        Mood and Affect: Mood normal.    ED Results / Procedures / Treatments   Labs (all labs ordered are listed, but only abnormal results are displayed) Results for orders placed or performed during the hospital encounter of 09/29/20  Comprehensive metabolic panel  Result Value Ref Range   Sodium 134 (L) 135 - 145 mmol/L   Potassium 4.5 3.5 - 5.1  mmol/L   Chloride 103 98 - 111 mmol/L   CO2 23 22 - 32 mmol/L   Glucose, Bld 345 (H) 70 - 99 mg/dL   BUN 18 8 - 23 mg/dL   Creatinine, Ser 1.28 (H) 0.61 - 1.24 mg/dL   Calcium 9.7 8.9 - 10.3 mg/dL   Total Protein 7.4 6.5 - 8.1 g/dL   Albumin 3.5 3.5 - 5.0 g/dL   AST 26 15 - 41 U/L   ALT 22 0 - 44 U/L   Alkaline Phosphatase 59 38 - 126 U/L   Total Bilirubin 0.6 0.3 - 1.2 mg/dL   GFR, Estimated >60 >60 mL/min   Anion gap 8 5 - 15  CBC with Differential  Result Value Ref Range   WBC 9.6 4.0 - 10.5 K/uL   RBC 4.56 4.22 - 5.81 MIL/uL   Hemoglobin 13.9 13.0 - 17.0 g/dL   HCT 43.3 39.0 - 52.0 %   MCV 95.0 80.0 - 100.0 fL   MCH 30.5 26.0 - 34.0 pg   MCHC 32.1 30.0 - 36.0 g/dL   RDW 14.1 11.5 - 15.5 %   Platelets 217 150 - 400 K/uL   nRBC 0.0 0.0 - 0.2 %   Neutrophils Relative % 74 %   Neutro Abs 6.9 1.7 - 7.7 K/uL   Lymphocytes Relative 19 %   Lymphs Abs 1.8 0.7 - 4.0 K/uL   Monocytes Relative 7 %   Monocytes Absolute 0.7 0.1 - 1.0 K/uL   Eosinophils Relative 0 %   Eosinophils Absolute 0.0 0.0 - 0.5 K/uL   Basophils Relative 0 %   Basophils Absolute 0.0 0.0 - 0.1 K/uL   Immature Granulocytes 0 %   Abs Immature Granulocytes 0.04 0.00 - 0.07 K/uL  Lactic acid, plasma  Result Value Ref Range   Lactic Acid, Venous 2.6 (HH) 0.5 - 1.9 mmol/L  Lactic acid, plasma  Result Value Ref Range   Lactic Acid, Venous 3.1 (HH) 0.5 - 1.9 mmol/L   DG Foot Complete Right  Result Date: 09/29/2020 Please see detailed radiograph report in office note.  DG Foot Complete Right  Result Date: 09/21/2020 CLINICAL DATA:  Chronic foot pain. Diabetic. Rule out osteomyelitis. EXAM: RIGHT FOOT COMPLETE - 3+ VIEW COMPARISON:  None. FINDINGS: Mild diffuse soft tissue swelling. Age indeterminate fracture is noted involving the head of the fifth metatarsal bone laterally. Hallux valgus deformity. Small plantar and posterior calcaneal heel spurs. No focal bone erosions identified IMPRESSION: 1. Age  indeterminate fracture involves the head of the fifth metatarsal bone. 2. No focal bone erosions noted suggestive of osteomyelitis. 3. Soft tissue swelling. Electronically Signed   By: Kerby Moors M.D.   On: 09/21/2020 13:43   OCT, Retina - OU - Both Eyes  Result Date: 09/01/2020 Right Eye Quality was good. Central Foveal Thickness: 241. Progression has no prior data. Findings include normal foveal contour, no IRF, no SRF. Left Eye Quality was good. Central Foveal Thickness: 247. Progression has no prior data. Findings include normal foveal contour, intraretinal hyper-reflective material, intraretinal fluid, no SRF (Focal cystic changes / IRHM nasal fovea). Notes *Images captured and stored on drive Diagnosis / Impression: OD: NFP; no IRF/SRF OS: Focal cystic changes / IRHM nasal fovea Clinical management: See below Abbreviations: NFP - Normal foveal profile. CME - cystoid macular edema. PED - pigment epithelial detachment. IRF - intraretinal fluid. SRF - subretinal fluid. EZ - ellipsoid zone. ERM - epiretinal membrane. ORA - outer retinal atrophy. ORT - outer retinal tubulation.  SRHM - subretinal hyper-reflective material. IRHM - intraretinal hyper-reflective material   Fluorescein Angiography Optos (Transit OS)  Result Date: 09/01/2020 Right Eye Progression has no prior data. Early phase findings include microaneurysm. Mid/Late phase findings include microaneurysm, leakage (No NV). Left Eye Progression has no prior data. Early phase findings include delayed filling, microaneurysm (Patchy choroidal filling, delayed venous return). Mid/Late phase findings include microaneurysm, leakage (No NV). Notes **Images stored on drive** Impression: Moderate NPDR OU Late, leaking MA OU No NV OU        EKG None  Radiology DG Foot Complete Right  Result Date: 09/29/2020 Please see detailed radiograph report in office note.   Procedures Procedures   Medications Ordered in ED Medications  sodium  chloride 0.9 % bolus 1,000 mL (has no administration in time range)  insulin aspart (novoLOG) injection 6 Units (has no administration in time range)    ED Course  I have reviewed the triage vital signs and the nursing notes.  Pertinent labs & imaging results that were available during my care of the patient were reviewed by me and considered in my medical decision making (see chart for details).    MDM Rules/Calculators/A&P                           Iv ns bolus. Stat labs. Imaging.   Reviewed nursing notes and prior charts for additional history.   Labs reviewed/interpreted by me - wbc 9.6. Glucose elev. Hc03 normal. Lactate mildly high. Iv ns bolus.   Xrays reviewed/interpreted by me - no obvious osteo.   Vascular study with ABIs ordered.   Iv antibiotics given.   Medicine consulted for admission. Likely will need surgery/ortho consult for toe amputation during hospital stay.   Additional labs reviewed/interpreted by me - delta lactate is higher than initial. Cultures added to workup. Additional ivf - LR 30 cc/kg.   Medicine to admit.   CRITICAL CARE RE: gangrenous/infected toe with cellulitis/sepsis, elevated lactate.  Performed by: Mirna Mires Total critical care time: 40 minutes Critical care time was exclusive of separately billable procedures and treating other patients. Critical care was necessary to treat or prevent imminent or life-threatening deterioration. Critical care was time spent personally by me on the following activities: development of treatment plan with patient and/or surrogate as well as nursing, discussions with consultants, evaluation of patient's response to treatment, examination of patient, obtaining history from patient or surrogate, ordering and performing treatments and interventions, ordering and review of laboratory studies, ordering and review of radiographic studies, pulse oximetry and re-evaluation of patient's condition.  Discussed w Lilly  ROC - will admit.     Final Clinical Impression(s) / ED Diagnoses Final diagnoses:  Injury    Rx / DC Orders ED Discharge Orders     None          Lajean Saver, MD 09/29/20 1336

## 2020-09-29 NOTE — ED Triage Notes (Signed)
Patient sent from podiatrist for evaluation and possible need for amputation of second toe on right foot. History of diabetes, patient reports a wound on right toe that has persisted for several weeks. Patient denies pain, is alert oriented and in no apparent distress at this time.

## 2020-09-29 NOTE — Progress Notes (Signed)
ABI completed. Refer to "CV Proc" under chart review to view preliminary results.  09/29/2020 3:26 PM Eula Fried., MHA, RVT, RDCS, RDMS

## 2020-09-29 NOTE — ED Notes (Signed)
Pt transported to vascular.  °

## 2020-09-29 NOTE — H&P (Addendum)
[facility] Admission History and Physical Service Pager: 100 mg daily,  Patient name: Jon Jones Medical record number: [id number] Date of birth: 1955/12/26 Age: 65 y.o. Gender: male  Primary Care Provider: Ladell Pier, MD Consultants: ortho Code Status: Full code Preferred Emergency Contact:  Contact Information     Name Relation Home Work Mobile   Woodham,Sara Spouse [phone]  [phone]        Chief Complaint: Toe wound  Assessment and Plan: Jon Jones is a 65 y.o. male presenting with a persistent wound on the second right toe. PMH is significant for type 2 diabetes, PAD, CAD, HFrEF, implanted cardiac defibrillator, and previously had infection and amputation of the left small toe.  Right foot second toe ulcer Patient states his second right toe nail began to turn black and about 2 weeks ago he states his skin started to fall off.  At that point he went to an urgent care and received doxycycline.  Today patient was seen by his podiatrist who sent him to the ED for concern of osteomyelitis.  He has a necrotic and oozing ulceration of the right second toe with fat layer exposed and questionable cellulitis of the foot.  Patient is afebrile, WBC WNL.  He has a lactic acid of 2.6 which subsequently increased to 3.1 and then 3.5. He was given 4 L LR fluid bolus and started on vancomycin and Zosyn. ED ordered vascular study with ABIs, X-ray of foot has not yet been read and MRI of right foot has been ordered.  With the extent of the ulceration of the toe I would not be surprised if he had osteomyelitis.  Podiatry has been consulted and plans to do a procedure on 7/22. -Admit to med-telemetry, attending Dr. Thompson Grayer -Podiatry consulted, appreciate recommendations -Likely procedure on 7/22. -Cardiac monitoring, if lactic acid improves and patient continues to look well can consider discontinuing this and transferring to MedSurg -Acetaminophen 650 every  6 hours as needed  -MRI of right foot ordered -IV Vancomycin and Zosyn -ABIs ordered -Blood cultures ordered -Repeat lactic acid -A.m. CBC -A.m. BMP -Consider PT/OT evaluate and treat once his procedure has been completed  Type 2 diabetes Patient takes Januvia, Jardiance, glipizide, metformin, and gabapentin.  Hemoglobin A1c from 09/08/20 was 9.8.  Blood glucose in the ED today is 345. -CBG monitoring -SSI -Tradjenta 5 mg daily per formulary -Can consider restarting Jardiance and metformin once his surgery is complete if appropriate  Hyperlipidemia Patient takes atorvastatin 80 mg daily.  Most recent lipid panel from February 22 shows LDL of 25, HDL of 25 -Continue atorvastatin  HFrEF Last echo was done 06/01/2020.  LVEF was 35 to 40%.  Moderately decreased function of the left ventricle with mild left ventricular hypertrophy. He takes Entresto 24 to 26 mg twice daily.  Additionally the patient takes metoprolol succinate, spironolactone which are shown mortality benefit in HFrEF -Continue Entresto, spironolactone, metoprolol succinate  CAD Patient has history of MI in 2016 and history of cardiac catheterization and currently has a cardiac defibrillator. Takes nitroglycerin 0.4 mg as needed, aspirin 81 mg, plavix 75 mg daily -Continue nitroglycerin, aspirin and Plavix -MRI aware that he has a cardiac defibrillator, they will investigate if this will prevent him from getting the MRI  PAD Patient had ABI 06/08/2020 which showed mild lateral lower extremity arterial disease.  ABIs being repeated in ED today.  Patient takes aspirin and Plavix. -Continue aspirin and Plavix  Hypertension Blood pressure in ED is 150/79.  Patient takes metoprolol succinate 100 mg daily, spironolactone 12.5 mg daily -Continue metoprolol succinate and spironolactone  FEN/GI: Heart healthy diet Prophylaxis: Heparin 5,000 units every 8 hours  Disposition: Med telemetry  History of Present Illness:  Jon Jones is a 65 y.o. male presenting with an ulcer on the right second toe.  Past medical history includes type 2 diabetes, hyperlipidemia, CAD, HTN.  He says he went to see his doctor due to concern of a fungal infection in his toenails. He was given a topical ointment but noticed soon after that his toenail and the skin around it was "turing black". He initially thought it was the fungus dying but noticed that when he rubbed the toe some of the skin rubbed off. This was about 1-2 weeks ago. He started doxycycline at that time after going to urgent care.  He went to podiatry today and was sent to the ED due to necrotic ulceration with drainage of the right second toe and questionable cellulitis. He denies toe pain. He had his 5th digit on his left foot amputated about a year ago.    Tobacco: Former smoker, age 78 until age 37. About 1-1.5ppd Alcohol: No alcohol Illicit drugs: none   Review Of Systems: Per HPI with the following additions:   Review of Systems  Constitutional:  Negative for chills and fever.  Respiratory:  Negative for shortness of breath.   Cardiovascular:  Negative for chest pain.  Gastrointestinal:  Negative for abdominal pain, diarrhea, nausea and vomiting.  Skin:  Positive for color change and wound.  Neurological:  Negative for headaches.    Patient Active Problem List   Diagnosis Date Noted   Cellulitis 09/29/2020   Chronic combined systolic and diastolic heart failure ([place]) 09/15/2020   Onychomycosis of toenail 08/24/2020   Diabetes mellitus ([place]) 07/23/2020   Type 2 diabetes mellitus with hyperglycemia, without long-term current use of insulin ([place]) 07/23/2020   Weight gain 07/23/2020   Type 2 diabetes mellitus with peripheral neuropathy ([place]) 07/23/2020   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 diabetes mellitus ([place]) 05/04/2020   Hypertensive heart disease with heart failure ([place]) 04/26/2020   Type 2 diabetes mellitus with diabetic peripheral angiopathy without  gangrene, without long-term current use of insulin ([place]) 04/26/2020   OSA on CPAP 04/26/2020   PAD (peripheral artery disease) ([place]) 04/26/2020   CAD in native artery 04/26/2020   Class 2 severe obesity due to excess calories with serious comorbidity and body mass index (BMI) of 38.0 to 38.9 in adult [facility]) 04/26/2020   Impingement syndrome of right shoulder    Nontraumatic complete tear of right rotator cuff    Type 2 diabetes mellitus with foot ulcer, without long-term current use of insulin ([name])    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   S/P debridement 07/19/2016   Acute gout of left elbow 07/19/2016    Past Medical History: Past Medical History:  Diagnosis Date   5 years ago    AICD (automatic cardioverter/defibrillator) present    Anxiety    Arthritis    Cataract    CHF (congestive heart failure) ([place])    Coronary artery disease    Diabetes mellitus without complication ([name])    Diabetic retinopathy ([place])    Dysrhythmia    GERD (gastroesophageal reflux disease)    PMH   Hypertension    Hypertensive retinopathy    Peripheral vascular disease ([place])    Peripheral vascular disease ([place])    Pneumonia  Shoulder impingement, right    Sleep apnea    wears cpap   Stomach ulcer    Wears glasses     Past Surgical History: Past Surgical History:  Procedure Laterality Date   AMPUTATION Left 03/12/2019   Procedure: 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;  Surgeon: [name], MD;  Location: [facility];  Service: Orthopedics;  Laterality: Left;   CARDIAC CATHETERIZATION     CARDIAC DEFIBRILLATOR PLACEMENT     I & D EXTREMITY Left 07/19/2016   Procedure: IRRIGATION AND DEBRIDEMENT EXTREMITY/OLECRANON(WASHOUT);  Surgeon: Leandrew Koyanagi, MD;  Location: [facility];  Service: Orthopedics;  Laterality: Left;   OLECRANON BURSECTOMY Left 07/19/2016   Procedure: LEFT ELBOW OLECRANON BURSECTOMY;  Surgeon: Leandrew Koyanagi, MD;  Location: [facility];  Service: Orthopedics;   Laterality: Left;   SHOULDER ARTHROSCOPY Right 05/16/2019   Procedure: Right Shoulder Arthroscopy;  Surgeon: [name], MD;  Location: [facility];  Service: Orthopedics;  Laterality: Right;    Social History: Social History   Tobacco Use   Smoking status: Former    Packs/day: 1.00    Years: 40.00    Pack years: 40.00    Types: Cigarettes    Quit date: 03/11/2019    Years since quitting: 1.5   Smokeless tobacco: Never  Vaping Use   Vaping Use: Never used  Substance Use Topics   Alcohol use: Not Currently   Drug use: Yes    Types: Oxycodone   Additional social history:   Please also refer to relevant sections of EMR.  Family History: Family History  Problem Relation Age of Onset   Heart disease Mother    Diabetes Mother    Peripheral vascular disease Mother     Allergies and Medications: No Known Allergies No current facility-administered medications on file prior to encounter.   Current Outpatient Medications on File Prior to Encounter  Medication Sig Dispense Refill   Accu-Chek Softclix Lancets lancets CHECK BLOOD SUGAR ONCE DAILY 100 each 2   aspirin 81 MG EC tablet Take 81 mg by mouth daily.     atorvastatin (LIPITOR) 80 MG tablet Take 1 tablet (80 mg total) by mouth every evening. 90 tablet 3   Blood Glucose Monitoring Suppl (ACCU-CHEK GUIDE ME) w/Device KIT Check blood sugar once daily. 1 kit 0   ciclopirox (PENLAC) 8 % solution Apply topically at bedtime. Apply over nail and surrounding skin. Apply daily over previous coat. After seven (7) days, may remove with alcohol and continue cycle. 6.6 mL 3   clobetasol ointment (TEMOVATE) 4.65 % Apply 1 application topically 2 (two) times daily as needed. 30 g 0   clopidogrel (PLAVIX) 75 MG tablet Take 75 mg by mouth daily.     doxycycline (VIBRA-TABS) 100 MG tablet Take 1 tablet (100 mg total) by mouth 2 (two) times daily for 10 days. 20 tablet 0   doxycycline (VIBRAMYCIN) 100 MG capsule Take 1 capsule (100 mg total) by  mouth 2 (two) times daily for 10 days. 20 capsule 0   empagliflozin (JARDIANCE) 25 MG TABS tablet Take 1 tablet (25 mg total) by mouth at bedtime. 90 tablet 2   gabapentin (NEURONTIN) 300 MG capsule Take 1 capsule (300 mg total) by mouth at bedtime. 30 capsule 5   glipiZIDE (GLUCOTROL) 10 MG tablet Take 2 tablets (20 mg total) by mouth 2 (two) times daily. 360 tablet 2   glucose blood (ACCU-CHEK AVIVA PLUS) test strip Test BS 3x/day with meals 100 each 3  metFORMIN (GLUCOPHAGE) 1000 MG tablet Take 1 tablet (1,000 mg total) by mouth 2 (two) times daily with a meal. 180 tablet 2   metoprolol succinate (TOPROL-XL) 100 MG 24 hr tablet Take 1 tablet (100 mg total) by mouth daily. 90 tablet 3   Misc. Devices MISC Cpap supplies-tubing mask Dx G47.33 and Z99.89 1 Device 0   nitroGLYCERIN (NITROSTAT) 0.4 MG SL tablet Place 0.4 mg under the tongue See admin instructions. Every 5 minutes as needed for chest pain. Max of 3 doses     nystatin cream (MYCOSTATIN) SMARTSIG:Sparingly Topical 2-3 Times Daily PRN     PROAIR HFA 108 (90 Base) MCG/ACT inhaler INHALE 2 PUFFS INTO THE LUNGS EVERY 6 HOURS AS NEEDED FOR WHEEZING OR SHORTNESS OF BREATH 8.5 g 0   sacubitril-valsartan (ENTRESTO) 24-26 MG Take 1 tablet by mouth 2 (two) times daily. 60 tablet 11   sitaGLIPtin (JANUVIA) 100 MG tablet Take 1 tablet (100 mg total) by mouth daily. 90 tablet 2   spironolactone (ALDACTONE) 25 MG tablet Take 0.5 tablets (12.5 mg total) by mouth daily. 45 tablet 3   triamcinolone (KENALOG) 0.1 % SMARTSIG:Sparingly Topical Twice Daily      Objective: BP 124/76   Pulse 79   Temp 98.1 F (36.7 C) (Oral)   Resp 20   Wt 108 kg   SpO2 99%   BMI 38.43 kg/m  Exam: General: Patient lying supine in bed, no acute distress Eyes: White sclera, no drainage ENTM: Slightly dry mucous membranes Cardiovascular: RRR, normal S1/S2 Respiratory: CTAB Gastrointestinal: Distended, nontender, soft MSK: Good bulk and tone Extremities:necrotic  and oozing ulceration of the right second toe with fat layer exposed. Swelling and erythema of the dorsum of the right foot Neuro: Oriented x4 Psych: Affect appropriate for situation  Labs and Imaging: CBC BMET  Recent Labs  Lab 09/29/20 0925  WBC 9.6  HGB 13.9  HCT 43.3  PLT 217   Recent Labs  Lab 09/29/20 0925  NA 134*  K 4.5  CL 103  CO2 23  BUN 18  CREATININE 1.28*  GLUCOSE 345*  CALCIUM 9.7        Precious Gilding, DO 09/29/2020, 1:44 PM PGY-1, [place] Intern pager: [phone], text pages welcome   Upper Level Addendum:  I have seen and evaluated this patient along with Dr. Ronnald Ramp and reviewed the above note, making necessary revisions as appropriate in green.  I agree with the medical decision making and physical exam as noted above.  Lurline Del, DO PGY-3 [facility]

## 2020-09-29 NOTE — Progress Notes (Signed)
Pharmacy Antibiotic Note  Jon Jones is a 65 y.o. male admitted on 09/29/2020 with  possible osteomyelitis/wound infection . Pharmacy has been consulted for vancomycin dosing. Pt is afebrile and WBC is WNL. SCr is slightly above baseline at 1.28 and lactic acid is increasing to 3.1.   Plan: Vancomycin 2gm IV x 1 then 1250mg  IV Q24H F/u renal fxn, C&S, clinical status and peak/trough at [facility] F/u continuation of gram negative coverage  Weight: 108 kg (238 lb 1.6 oz)  Temp (24hrs), Avg:98.1 F (36.7 C), Min:98.1 F (36.7 C), Max:98.1 F (36.7 C)  Recent Labs  Lab 09/29/20 0925 09/29/20 1206  WBC 9.6  --   CREATININE 1.28*  --   LATICACIDVEN 2.6* 3.1*    Estimated Creatinine Clearance: 68.1 mL/min (A) (by C-G formula based on SCr of 1.28 mg/dL (H)).    No Known Allergies  Antimicrobials this admission: Vanc 7/20>> Zosyn x 1 7/20  Dose adjustments this admission: N/A  Microbiology results: Pending  Thank you for allowing pharmacy to be a part of this patient's care.  Harshan Kearley, 8/20 09/29/2020 3:34 PM

## 2020-09-29 NOTE — Progress Notes (Signed)
Subjective:  Patient ID: Jon Jones, male    DOB: [birth year],  MRN: [id number]  Chief Complaint  Patient presents with   Foot Ulcer    Right foot 2nd toe ulcer     65 y.o. male presents for wound care.  Patient presents with follow-up to right second digit ulceration with fat layer exposed.  Patient states he may have gotten worse.  He was on vacation.  He is a diabetic with last A1c of 9.8.  He states that there is some redness around it back he has failed outpatient doxycycline.  He denies any other acute complaints.  He denies any nausea fever chills vomiting.   Review of Systems: Negative except as noted in the HPI. Denies N/V/F/Ch.  Past Medical History:  Diagnosis Date   5 years ago    AICD (automatic cardioverter/defibrillator) present    Anxiety    Arthritis    Cataract    CHF (congestive heart failure) ([name])    Coronary artery disease    Diabetes mellitus without complication ([name])    Diabetic retinopathy ([place])    Dysrhythmia    GERD (gastroesophageal reflux disease)    PMH   Hypertension    Hypertensive retinopathy    Peripheral vascular disease ([name])    Peripheral vascular disease ([name])    Pneumonia    Shoulder impingement, right    Sleep apnea    wears cpap   Stomach ulcer    Wears glasses     Current Outpatient Medications:    Accu-Chek Softclix Lancets lancets, CHECK BLOOD SUGAR ONCE DAILY, Disp: 100 each, Rfl: 2   aspirin 81 MG EC tablet, Take 81 mg by mouth daily., Disp: , Rfl:    atorvastatin (LIPITOR) 80 MG tablet, Take 1 tablet (80 mg total) by mouth every evening., Disp: 90 tablet, Rfl: 3   Blood Glucose Monitoring Suppl (ACCU-CHEK GUIDE ME) w/Device KIT, Check blood sugar once daily., Disp: 1 kit, Rfl: 0   ciclopirox (PENLAC) 8 % solution, Apply topically at bedtime. Apply over nail and surrounding skin. Apply daily over previous coat. After seven (7) days, may remove with alcohol and continue cycle., Disp: 6.6 mL, Rfl: 3   clobetasol ointment  (TEMOVATE) 1.24 %, Apply 1 application topically 2 (two) times daily as needed., Disp: 30 g, Rfl: 0   clopidogrel (PLAVIX) 75 MG tablet, Take 75 mg by mouth daily., Disp: , Rfl:    doxycycline (VIBRA-TABS) 100 MG tablet, Take 1 tablet (100 mg total) by mouth 2 (two) times daily for 10 days., Disp: 20 tablet, Rfl: 0   doxycycline (VIBRAMYCIN) 100 MG capsule, Take 1 capsule (100 mg total) by mouth 2 (two) times daily for 10 days., Disp: 20 capsule, Rfl: 0   empagliflozin (JARDIANCE) 25 MG TABS tablet, Take 1 tablet (25 mg total) by mouth at bedtime., Disp: 90 tablet, Rfl: 2   gabapentin (NEURONTIN) 300 MG capsule, Take 1 capsule (300 mg total) by mouth at bedtime., Disp: 30 capsule, Rfl: 5   glipiZIDE (GLUCOTROL) 10 MG tablet, Take 2 tablets (20 mg total) by mouth 2 (two) times daily., Disp: 360 tablet, Rfl: 2   glucose blood (ACCU-CHEK AVIVA PLUS) test strip, Test BS 3x/day with meals, Disp: 100 each, Rfl: 3   metFORMIN (GLUCOPHAGE) 1000 MG tablet, Take 1 tablet (1,000 mg total) by mouth 2 (two) times daily with a meal., Disp: 180 tablet, Rfl: 2   metoprolol succinate (TOPROL-XL) 100 MG 24 hr tablet, Take 1 tablet (100 mg total) by  mouth daily., Disp: 90 tablet, Rfl: 3   Misc. Devices MISC, Cpap supplies-tubing mask Dx G47.33 and Z99.89, Disp: 1 Device, Rfl: 0   nitroGLYCERIN (NITROSTAT) 0.4 MG SL tablet, Place 0.4 mg under the tongue See admin instructions. Every 5 minutes as needed for chest pain. Max of 3 doses, Disp: , Rfl:    nystatin cream (MYCOSTATIN), SMARTSIG:Sparingly Topical 2-3 Times Daily PRN, Disp: , Rfl:    PROAIR HFA 108 (90 Base) MCG/ACT inhaler, INHALE 2 PUFFS INTO THE LUNGS EVERY 6 HOURS AS NEEDED FOR WHEEZING OR SHORTNESS OF BREATH, Disp: 8.5 g, Rfl: 0   sacubitril-valsartan (ENTRESTO) 24-26 MG, Take 1 tablet by mouth 2 (two) times daily., Disp: 60 tablet, Rfl: 11   sitaGLIPtin (JANUVIA) 100 MG tablet, Take 1 tablet (100 mg total) by mouth daily., Disp: 90 tablet, Rfl: 2    spironolactone (ALDACTONE) 25 MG tablet, Take 0.5 tablets (12.5 mg total) by mouth daily., Disp: 45 tablet, Rfl: 3   triamcinolone (KENALOG) 0.1 %, SMARTSIG:Sparingly Topical Twice Daily, Disp: , Rfl:   Social History   Tobacco Use  Smoking Status Former   Packs/day: 1.00   Years: 40.00   Pack years: 40.00   Types: Cigarettes   Quit date: 03/11/2019   Years since quitting: 1.5  Smokeless Tobacco Never    No Known Allergies Objective:  There were no vitals filed for this visit. There is no height or weight on file to calculate BMI. Constitutional Well developed. Well nourished.  Vascular Dorsalis pedis pulses nonpalpable bilaterally. Posterior tibial pulses non palpable bilaterally. Capillary refill n diminished o all digits.  No cyanosis or clubbing noted. Pedal hair growth none  Neurologic Normal speech. Oriented to person, place, and time. Protective sensation absent  Dermatologic Wound Location: Right second digit with fat layer exposed.  Has regressed and is now probing down to bone.  Probes to bone.  Mild purulent drainage noted.  No malodor present.  Epidermal lysis of the circumferential skin of the second digit noted Wound Base: Mixed Granular/Fibrotic Peri-wound: Calloused Exudate: Scant/small amount Serosanguinous exudate Wound Measurements: -See below  Orthopedic: No pain to palpation either foot.   Radiographs: Mild cortical irregularity noted at the distal phalanx of the right second digit.  These findings consistent with osteomyelitic changes Assessment:   1. Toe ulcer, right, with fat layer exposed ([place])   2. Acute osteomyelitis of right ankle or foot ([place])   3. Type 2 diabetes mellitus with diabetic peripheral angiopathy without gangrene, without long-term current use of insulin ([place])    Plan:  Patient was evaluated and treated and all questions answered.  Ulcer right second digit with fat layer exposed with underlying probing down to bone concern for  osteomyelitis -All questions and concerns were directly addressed -Continue Betadine wet-to-dry dressing. -Patient will benefit from ABIs PVRs to assess the vascular flow to the light right lower extremity and possible vascular consult -I discussed with the patient given the nature of the toe he will benefit from admission into the hospital.  He will be sent to the emergency room to be admitted for IV antibiotics and an MRI of the right foot to assess for osteomyelitis of the second digit -He will need a surgical amputation of the right second digit pending vascular flow assessment -I will plan on doing the surgical amputation on Friday if cleared. -He is a high risk of worsening infection versus losing foot and limb given the nature of his diabetes with last A1c of 9.8%.  I  discussed this with patient extensive detail he states understanding.    No follow-ups on file.

## 2020-09-30 ENCOUNTER — Encounter (HOSPITAL_COMMUNITY): Payer: Self-pay | Admitting: Family Medicine

## 2020-09-30 ENCOUNTER — Inpatient Hospital Stay (HOSPITAL_COMMUNITY): Payer: Medicare Other

## 2020-09-30 DIAGNOSIS — M86171 Other acute osteomyelitis, right ankle and foot: Secondary | ICD-10-CM | POA: Diagnosis not present

## 2020-09-30 DIAGNOSIS — Z95810 Presence of automatic (implantable) cardiac defibrillator: Secondary | ICD-10-CM | POA: Diagnosis not present

## 2020-09-30 DIAGNOSIS — R600 Localized edema: Secondary | ICD-10-CM | POA: Diagnosis not present

## 2020-09-30 DIAGNOSIS — M7989 Other specified soft tissue disorders: Secondary | ICD-10-CM | POA: Diagnosis not present

## 2020-09-30 DIAGNOSIS — L97519 Non-pressure chronic ulcer of other part of right foot with unspecified severity: Secondary | ICD-10-CM | POA: Diagnosis not present

## 2020-09-30 LAB — BASIC METABOLIC PANEL
Anion gap: 8 (ref 5–15)
BUN: 15 mg/dL (ref 8–23)
CO2: 22 mmol/L (ref 22–32)
Calcium: 9 mg/dL (ref 8.9–10.3)
Chloride: 106 mmol/L (ref 98–111)
Creatinine, Ser: 0.96 mg/dL (ref 0.61–1.24)
GFR, Estimated: >60 mL/min (ref >60)
Glucose, Bld: 134 mg/dL — ABNORMAL HIGH (ref 70–99)
Potassium: 4 mmol/L (ref 3.5–5.1)
Sodium: 136 mmol/L (ref 135–145)

## 2020-09-30 LAB — GLUCOSE, CAPILLARY
Glucose-Capillary: 154 mg/dL — ABNORMAL HIGH (ref 70–99)
Glucose-Capillary: 193 mg/dL — ABNORMAL HIGH (ref 70–99)
Glucose-Capillary: 205 mg/dL — ABNORMAL HIGH (ref 70–99)
Glucose-Capillary: 145 mg/dL — ABNORMAL HIGH (ref 70–99)
Glucose-Capillary: 282 mg/dL — ABNORMAL HIGH (ref 70–99)

## 2020-09-30 LAB — CBC
HCT: 36.6 % — ABNORMAL LOW (ref 39.0–52.0)
Hemoglobin: 12 g/dL — ABNORMAL LOW (ref 13.0–17.0)
MCH: 30.1 pg (ref 26.0–34.0)
MCHC: 32.8 g/dL (ref 30.0–36.0)
MCV: 91.7 fL (ref 80.0–100.0)
Platelets: 170 10*3/uL (ref 150–400)
RBC: 3.99 MIL/uL — ABNORMAL LOW (ref 4.22–5.81)
RDW: 13.9 % (ref 11.5–15.5)
WBC: 8.4 10*3/uL (ref 4.0–10.5)
nRBC: 0 % (ref 0.0–0.2)

## 2020-09-30 LAB — SURGICAL PCR SCREEN
MRSA, PCR: NEGATIVE
Staphylococcus aureus: NEGATIVE

## 2020-09-30 MED ORDER — PIPERACILLIN-TAZOBACTAM 3.375 G IVPB
3.3750 g | Freq: Three times a day (TID) | INTRAVENOUS | Status: DC
  Administered 2020-09-30 – 2020-10-01 (×3): 3.375 g via INTRAVENOUS
  Filled 2020-09-30 (×5): qty 50

## 2020-09-30 MED ORDER — INSULIN ASPART 100 UNIT/ML IJ SOLN
5.0000 [IU] | Freq: Once | INTRAMUSCULAR | Status: AC
  Administered 2020-09-30: 5 [IU] via SUBCUTANEOUS

## 2020-09-30 NOTE — Progress Notes (Signed)
Informed of MRI for today.   Device system confirmed to be MRI conditional, with implant date > 6 weeks ago, and no evidence of abandoned or epicardial leads in review of most recent CXR Interrogation from today reviewed, pt is currently AS-VS at ~80 bpm Change device settings for MRI to DOO at 90 bpm  Tachy-therapies to off if applicable.  Program device back to pre-MRI settings after completion of exam.  Luane School  09/30/2020 12:04 PM

## 2020-09-30 NOTE — Progress Notes (Signed)
Inpatient Diabetes Program Recommendations  AACE/ADA: New Consensus Statement on Inpatient Glycemic Control (2015)  Target Ranges:  Prepandial:   less than 140 mg/dL      Peak postprandial:   less than 180 mg/dL (1-2 hours)      Critically ill patients:  140 - 180 mg/dL   Lab Results  Component Value Date   GLUCAP 193 (H) 09/30/2020   HGBA1C 9.8 (H) 09/08/2020    Review of Glycemic Control Results for IDAN, PRIME (MRN [id number]) as of 09/30/2020 12:46  Ref. Range 09/30/2020 10:56  Glucose-Capillary Latest Ref Range: 70 - 99 mg/dL 366 (H)   Diabetes history: Type 2 DM Outpatient Diabetes medications: Metformin 1000 mg BID, Jardiance 25 mg QD, Glipizide 20 mg BID, Januvia 100 mg QD Current orders for Inpatient glycemic control: Tradjenta 5 mg QD, Novolog 0-9 units TID  Inpatient Diabetes Program Recommendations:    Attempted to speak with patient, unavailable due to MRI testing.   Of note, patient is followed by Dr Lonzo Cloud with outpatient endocrinology. Last appointment 07/23/20. During this visit, patient adamantly refusing injections outpatient with insulin or GLP, despite education. Will revisit.   Thanks, Lujean Rave, MSN, RNC-OB Diabetes Coordinator [phone] (8a-5p)

## 2020-09-30 NOTE — Progress Notes (Signed)
Pharmacy Antibiotic Note  Jon Jones is a 65 y.o. male admitted on 09/29/2020 with  possible osteomyelitis/wound infection of right toe . Pharmacy has been consulted for vancomycin and zosyn dosing.   Pt is afebrile and WBC is WNL. SCr is now normal at 0.96 and lactic acid has normalized. MRI pending.  Plan: Vancomycin 1250mg  IV Q24H Zosyn 3.375 mg Q8H F/u renal fxn, C&S, clinical status, peak/trough at [facility], and imaging for possible de-escalation  Weight: 108 kg (238 lb 1.6 oz)  Temp (24hrs), Avg:98.4 F (36.9 C), Min:98.2 F (36.8 C), Max:98.6 F (37 C)  Recent Labs  Lab 09/29/20 0925 09/29/20 1206 09/29/20 1420 09/29/20 1730 09/29/20 2250 09/30/20 0456  WBC 9.6  --   --   --   --  8.4  CREATININE 1.28*  --   --   --   --  0.96  LATICACIDVEN 2.6* 3.1* 3.5* 4.7* 1.5  --      Estimated Creatinine Clearance: 90.8 mL/min (by C-G formula based on SCr of 0.96 mg/dL).    No Known Allergies  Antimicrobials this admission: Vanc 7/20>> Zosyn x 1 7/20, 7/21 >>  Dose adjustments this admission: N/A  Microbiology results: 7/20 BCx >> sent  Thank you for allowing pharmacy to be a part of this patient's care. 8/20, PharmD PGY1 Pharmacy Resident 09/30/2020  9:29 AM  Please check [email or web address] for unit-specific pharmacy phone numbers.

## 2020-09-30 NOTE — Progress Notes (Signed)
Patient down to MRI from 5C17 for MRI right foot wo contrast. Patient has medtronic device. [facility]link express sent to Mike[facility] and [facility]. Orders received for DOO 90. Will re-program once scan is complete.

## 2020-09-30 NOTE — Progress Notes (Signed)
Family Medicine Teaching Service Daily Progress Note Intern Pager: [phone]  Patient name: Jon Jones Medical record number: [id number] Date of birth: [birth year] Age: 65 y.o. Gender: male  Primary Care Provider: Marcine Matar, MD Consultants: Podiatry  Code Status: full  Pt Overview and Major Events to Date:  7/20-Admitted  Assessment and Plan: Patient is a 65 year old male presenting with a persistent wound on the second right toe.  Past medical history significant for type 2 diabetes, PAD, CAD, HTN FRAX, implanted cardiac defibrillator, and previously had an infection and amputation of the left small toe.  Right foot second toe ulcer After using some topical antifungal ointment on his it toes, his second right toe began to turn black, skin started to slough off about 2 weeks ago.  He received doxycycline from urgent care.  Toe worsened and podiatry sent him to ED on 7/20.  Second right toe has a necrotic and oozing ulceration with the fat layer exposed, likely has osteomyelitis.  Podiatry would like an MRI of the foot before amputating the toe.  On admission, lactic acidosis with elevated and trended upward.  CCM was consulted and stated it was likely due to poorly controlled diabetes.  They recommended strict I/O's, keeping tight control of blood sugars, and avoiding hypotension.  Patient was put on med telemetry due to rising lactic acidosis.  On the evening of 7/20 lactic acidosis decreased to 1.5 and will no longer be trended.   -MRI of right foot today -Amputation of second right toe tomorrow by podiatry -Acetaminophen 650 every 6 hours as needed -Continue vancomycin daily -Continue Zosyn daily -CMP daily -CBC daily  Type 2 diabetes Patient takes Januvia, Jardiance, glipizide, metformin, and gabapentin at home.  Hemoglobin A1c from 08/2920 was 9.8.  Blood glucose today is 154. -CBG monitoring -SSI -Tradjenta 5 mg daily per formulary -Consider restarting Jardiance and  metformin when surgery is complete  HFrEF Last echo was done 06/01/2020.  LVEF was 35 to 40%.  Moderately decreased function of the left ventricle with mild left ventricular hypertrophy.  He takes Entresto 24 to 26 mg twice daily.  Patient also takes metoprolol succinate, spironolactone which are showing otology benefit in HFrEF -Continue Entresto, spironolactone, metoprolol succinate  CAD Has history of MI in 2016 history of cardiac cath and has a cardiac defibrillator. -Continue nitroglycerin, aspirin and Plavix  PAD Patient had ABI on 7/20 which showed mild right lower extremity arterial disease but no evidence of significant left lower extremity arterial disease. -Continue aspirin and Plavix  Hypertension Blood pressure today is 104/61. -Continue metoprolol succinate and spironolactone   FEN/GI: heart healthy/carb modified PPx: heparin  Dispo:Home in 2-3 days. Barriers include second right toe amputation.   Subjective:  Patient is resting in bed, states he is in absolutely no pain and feels fine.  No chest pain, headaches, fevers.  Objective: Temp:  [98.1 F (36.7 C)-98.5 F (36.9 C)] 98.2 F (36.8 C) (07/21 0504) Pulse Rate:  [77-96] 88 (07/21 0504) Resp:  [16-21] 18 (07/21 0504) BP: (104-150)/(61-83) 104/61 (07/21 0504) SpO2:  [94 %-100 %] 96 % (07/21 0504) Weight:  [606 kg] 108 kg (07/20 1300) Physical Exam: General: Patient lying supine in bed, no acute distress Cardiovascular: RRR, normal S1/S2 Respiratory: CTAB Abdomen: Soft, nontender to palpation   Laboratory: Recent Labs  Lab 09/29/20 0925 09/30/20 0456  WBC 9.6 8.4  HGB 13.9 12.0*  HCT 43.3 36.6*  PLT 217 170   Recent Labs  Lab 09/29/20 0925 09/30/20 0456  NA 134* 136  K 4.5 4.0  CL 103 106  CO2 23 22  BUN 18 15  CREATININE 1.28* 0.96  CALCIUM 9.7 9.0  PROT 7.4  --   BILITOT 0.6  --   ALKPHOS 59  --   ALT 22  --   AST 26  --   GLUCOSE 345* 134Erick Alley,  DO 09/30/2020, 7:07 AM PGY-1, [facility] Family Medicine FPTS Intern pager: [phone], text pages welcome

## 2020-09-30 NOTE — Anesthesia Preprocedure Evaluation (Addendum)
Anesthesia Evaluation  Patient identified by MRN, date of birth, ID band Patient awake    Reviewed: Allergy & Precautions, NPO status , Patient's Chart, lab work & pertinent test results  Airway Mallampati: I  TM Distance: >3 FB Neck ROM: Full    Dental  (+) Upper Dentures, Lower Dentures   Pulmonary sleep apnea and Continuous Positive Airway Pressure Ventilation , former smoker,    Pulmonary exam normal breath sounds clear to auscultation       Cardiovascular hypertension, Pt. on home beta blockers and Pt. on medications + CAD, + Past MI, + Peripheral Vascular Disease and +CHF  Normal cardiovascular exam+ Cardiac Defibrillator  Rhythm:Regular Rate:Normal  ECHO: 1. Left ventricular ejection fraction, by estimation, is 35 to 40%. The left ventricle has moderately decreased function. The left ventricle demonstrates regional wall motion abnormalities (see scoring diagram/findings for description). There is mild left ventricular hypertrophy. Left ventricular diastolic parameters were normal. There is severe akinesis of the left ventricular, basal-mid inferior wall. 2. Right ventricular systolic function is normal. The right ventricular size is normal. 3. The mitral valve is normal in structure. No evidence of mitral valve regurgitation. No evidence of mitral stenosis. 4. The aortic valve is normal in structure. Aortic valve regurgitation is not visualized.   Neuro/Psych Anxiety negative neurological ROS     GI/Hepatic negative GI ROS, Neg liver ROS,   Endo/Other  diabetes, Oral Hypoglycemic Agents  Renal/GU negative Renal ROS     Musculoskeletal  (+) Arthritis ,   Abdominal (+) + obese,   Peds  Hematology  (+) anemia , HLD   Anesthesia Other Findings   Reproductive/Obstetrics                           Anesthesia Physical Anesthesia Plan  ASA: 3  Anesthesia Plan: MAC   Post-op Pain  Management:    Induction: Intravenous  PONV Risk Score and Plan: 1 and Ondansetron, Dexamethasone, Midazolam, Propofol infusion and Treatment may vary due to age or medical condition  Airway Management Planned: Simple Face Mask  Additional Equipment:   Intra-op Plan:   Post-operative Plan:   Informed Consent: I have reviewed the patients History and Physical, chart, labs and discussed the procedure including the risks, benefits and alternatives for the proposed anesthesia with the patient or authorized representative who has indicated his/her understanding and acceptance.     Dental advisory given  Plan Discussed with: CRNA  Anesthesia Plan Comments:         Anesthesia Quick Evaluation

## 2020-10-01 ENCOUNTER — Encounter (HOSPITAL_COMMUNITY): Payer: Self-pay | Admitting: Family Medicine

## 2020-10-01 ENCOUNTER — Inpatient Hospital Stay (HOSPITAL_COMMUNITY): Payer: Medicare Other | Admitting: Anesthesiology

## 2020-10-01 ENCOUNTER — Encounter (HOSPITAL_COMMUNITY)
Admission: EM | Disposition: A | Discharge status: Home / Self Care | Payer: Self-pay | Source: Home / Self Care | Attending: Family Medicine

## 2020-10-01 ENCOUNTER — Other Ambulatory Visit (HOSPITAL_COMMUNITY): Payer: Self-pay

## 2020-10-01 DIAGNOSIS — G4733 Obstructive sleep apnea (adult) (pediatric): Secondary | ICD-10-CM | POA: Diagnosis not present

## 2020-10-01 DIAGNOSIS — E1169 Type 2 diabetes mellitus with other specified complication: Secondary | ICD-10-CM | POA: Diagnosis not present

## 2020-10-01 DIAGNOSIS — L089 Local infection of the skin and subcutaneous tissue, unspecified: Secondary | ICD-10-CM | POA: Diagnosis not present

## 2020-10-01 DIAGNOSIS — E11628 Type 2 diabetes mellitus with other skin complications: Secondary | ICD-10-CM | POA: Diagnosis not present

## 2020-10-01 DIAGNOSIS — I110 Hypertensive heart disease with heart failure: Secondary | ICD-10-CM | POA: Diagnosis not present

## 2020-10-01 DIAGNOSIS — I5022 Chronic systolic (congestive) heart failure: Secondary | ICD-10-CM | POA: Diagnosis not present

## 2020-10-01 DIAGNOSIS — E1165 Type 2 diabetes mellitus with hyperglycemia: Secondary | ICD-10-CM | POA: Diagnosis not present

## 2020-10-01 DIAGNOSIS — L03031 Cellulitis of right toe: Secondary | ICD-10-CM | POA: Diagnosis not present

## 2020-10-01 DIAGNOSIS — M868X7 Other osteomyelitis, ankle and foot: Secondary | ICD-10-CM | POA: Diagnosis not present

## 2020-10-01 DIAGNOSIS — R7989 Other specified abnormal findings of blood chemistry: Secondary | ICD-10-CM | POA: Diagnosis not present

## 2020-10-01 DIAGNOSIS — E1142 Type 2 diabetes mellitus with diabetic polyneuropathy: Secondary | ICD-10-CM | POA: Diagnosis not present

## 2020-10-01 DIAGNOSIS — E872 Acidosis: Secondary | ICD-10-CM | POA: Diagnosis not present

## 2020-10-01 DIAGNOSIS — E1152 Type 2 diabetes mellitus with diabetic peripheral angiopathy with gangrene: Secondary | ICD-10-CM | POA: Diagnosis not present

## 2020-10-01 DIAGNOSIS — Z6838 Body mass index (BMI) 38.0-38.9, adult: Secondary | ICD-10-CM | POA: Diagnosis not present

## 2020-10-01 DIAGNOSIS — Z20822 Contact with and (suspected) exposure to covid-19: Secondary | ICD-10-CM | POA: Diagnosis not present

## 2020-10-01 DIAGNOSIS — Z7984 Long term (current) use of oral hypoglycemic drugs: Secondary | ICD-10-CM | POA: Diagnosis not present

## 2020-10-01 DIAGNOSIS — E11621 Type 2 diabetes mellitus with foot ulcer: Secondary | ICD-10-CM | POA: Diagnosis not present

## 2020-10-01 DIAGNOSIS — L97519 Non-pressure chronic ulcer of other part of right foot with unspecified severity: Secondary | ICD-10-CM | POA: Diagnosis not present

## 2020-10-01 DIAGNOSIS — M86671 Other chronic osteomyelitis, right ankle and foot: Secondary | ICD-10-CM | POA: Diagnosis not present

## 2020-10-01 DIAGNOSIS — Z9989 Dependence on other enabling machines and devices: Secondary | ICD-10-CM | POA: Diagnosis not present

## 2020-10-01 HISTORY — PX: AMPUTATION: SHX166

## 2020-10-01 LAB — CBC
HCT: 40.1 % (ref 39.0–52.0)
Hemoglobin: 12.8 g/dL — ABNORMAL LOW (ref 13.0–17.0)
MCH: 29.6 pg (ref 26.0–34.0)
MCHC: 31.9 g/dL (ref 30.0–36.0)
MCV: 92.8 fL (ref 80.0–100.0)
Platelets: 202 10*3/uL (ref 150–400)
RBC: 4.32 MIL/uL (ref 4.22–5.81)
RDW: 14.1 % (ref 11.5–15.5)
WBC: 7.4 10*3/uL (ref 4.0–10.5)
nRBC: 0 % (ref 0.0–0.2)

## 2020-10-01 LAB — BASIC METABOLIC PANEL
Anion gap: 9 (ref 5–15)
BUN: 15 mg/dL (ref 8–23)
CO2: 23 mmol/L (ref 22–32)
Calcium: 9.3 mg/dL (ref 8.9–10.3)
Chloride: 104 mmol/L (ref 98–111)
Creatinine, Ser: 1.13 mg/dL (ref 0.61–1.24)
GFR, Estimated: >60 mL/min (ref >60)
Glucose, Bld: 188 mg/dL — ABNORMAL HIGH (ref 70–99)
Potassium: 3.7 mmol/L (ref 3.5–5.1)
Sodium: 136 mmol/L (ref 135–145)

## 2020-10-01 LAB — GLUCOSE, CAPILLARY
Glucose-Capillary: 191 mg/dL — ABNORMAL HIGH (ref 70–99)
Glucose-Capillary: 167 mg/dL — ABNORMAL HIGH (ref 70–99)
Glucose-Capillary: 190 mg/dL — ABNORMAL HIGH (ref 70–99)
Glucose-Capillary: 257 mg/dL — ABNORMAL HIGH (ref 70–99)

## 2020-10-01 LAB — SARS CORONAVIRUS 2 (TAT 6-24 HRS): SARS Coronavirus 2: NEGATIVE

## 2020-10-01 SURGERY — AMPUTATION, FOOT, RAY
Anesthesia: Monitor Anesthesia Care | Laterality: Right

## 2020-10-01 MED ORDER — PROMETHAZINE HCL 25 MG/ML IJ SOLN: 6.2500 mg | INTRAMUSCULAR | Status: DC | PRN

## 2020-10-01 MED ORDER — ACETAMINOPHEN 10 MG/ML IV SOLN: 1000.0000 mg | Freq: Once | INTRAVENOUS | Status: DC | PRN

## 2020-10-01 MED ORDER — 0.9 % SODIUM CHLORIDE (POUR BTL) OPTIME
TOPICAL | Status: DC | PRN
  Administered 2020-10-01: 1000 mL

## 2020-10-01 MED ORDER — SULFAMETHOXAZOLE-TRIMETHOPRIM 800-160 MG PO TABS
1.0000 | ORAL_TABLET | Freq: Two times a day (BID) | ORAL | 0 refills | Status: AC
  Filled 2020-10-01: qty 28, 14d supply, fill #0

## 2020-10-01 MED ORDER — LACTATED RINGERS IV SOLN: INTRAVENOUS | Status: DC | PRN

## 2020-10-01 MED ORDER — OXYCODONE HCL 5 MG/5ML PO SOLN: 5.0000 mg | Freq: Once | ORAL | Status: DC | PRN

## 2020-10-01 MED ORDER — PROPOFOL 10 MG/ML IV BOLUS
INTRAVENOUS | Status: AC
  Filled 2020-10-01: qty 20

## 2020-10-01 MED ORDER — BUPIVACAINE HCL 0.5 % IJ SOLN
INTRAMUSCULAR | Status: AC
  Filled 2020-10-01: qty 1

## 2020-10-01 MED ORDER — BUPIVACAINE-EPINEPHRINE 0.5% -1:200000 IJ SOLN
INTRAMUSCULAR | Status: AC
  Filled 2020-10-01: qty 1

## 2020-10-01 MED ORDER — ONDANSETRON HCL 4 MG/2ML IJ SOLN
INTRAMUSCULAR | Status: AC
  Filled 2020-10-01: qty 2

## 2020-10-01 MED ORDER — FENTANYL CITRATE (PF) 100 MCG/2ML IJ SOLN
INTRAMUSCULAR | Status: DC | PRN
  Administered 2020-10-01: 50 ug via INTRAVENOUS

## 2020-10-01 MED ORDER — FENTANYL CITRATE (PF) 100 MCG/2ML IJ SOLN: 25.0000 ug | INTRAMUSCULAR | Status: DC | PRN

## 2020-10-01 MED ORDER — ONDANSETRON HCL 4 MG/2ML IJ SOLN
INTRAMUSCULAR | Status: DC | PRN
  Administered 2020-10-01: 4 mg via INTRAVENOUS

## 2020-10-01 MED ORDER — LIDOCAINE-EPINEPHRINE 1 %-1:100000 IJ SOLN
INTRAMUSCULAR | Status: AC
  Filled 2020-10-01: qty 1

## 2020-10-01 MED ORDER — BUPIVACAINE HCL 0.5 % IJ SOLN
INTRAMUSCULAR | Status: DC | PRN
  Administered 2020-10-01: 10 mL

## 2020-10-01 MED ORDER — OXYCODONE HCL 5 MG PO TABS: 5.0000 mg | ORAL_TABLET | Freq: Once | ORAL | Status: DC | PRN

## 2020-10-01 MED ORDER — AMISULPRIDE (ANTIEMETIC) 5 MG/2ML IV SOLN: 10.0000 mg | Freq: Once | INTRAVENOUS | Status: DC | PRN

## 2020-10-01 MED ORDER — MIDAZOLAM HCL 5 MG/5ML IJ SOLN
INTRAMUSCULAR | Status: DC | PRN
  Administered 2020-10-01: 2 mg via INTRAVENOUS

## 2020-10-01 MED ORDER — FENTANYL CITRATE (PF) 250 MCG/5ML IJ SOLN
INTRAMUSCULAR | Status: AC
  Filled 2020-10-01: qty 5

## 2020-10-01 MED ORDER — PROPOFOL 500 MG/50ML IV EMUL
INTRAVENOUS | Status: DC | PRN
  Administered 2020-10-01: 40 ug/kg/min via INTRAVENOUS

## 2020-10-01 MED ORDER — MIDAZOLAM HCL 2 MG/2ML IJ SOLN
INTRAMUSCULAR | Status: AC
  Filled 2020-10-01: qty 2

## 2020-10-01 MED ORDER — ACETAMINOPHEN 325 MG PO TABS: 650.0000 mg | ORAL_TABLET | Freq: Four times a day (QID) | ORAL | Status: AC | PRN

## 2020-10-01 MED ORDER — PHENYLEPHRINE 40 MCG/ML (10ML) SYRINGE FOR IV PUSH (FOR BLOOD PRESSURE SUPPORT)
PREFILLED_SYRINGE | INTRAVENOUS | Status: AC
  Filled 2020-10-01: qty 10

## 2020-10-01 SURGICAL SUPPLY — 28 items
BAG COUNTER SPONGE SURGICOUNT (BAG) IMPLANT
BNDG COHESIVE 4X5 TAN STRL (GAUZE/BANDAGES/DRESSINGS) ×2 IMPLANT
BNDG ELASTIC 4X5.8 VLCR STR LF (GAUZE/BANDAGES/DRESSINGS) ×2 IMPLANT
BNDG ESMARK 4X9 LF (GAUZE/BANDAGES/DRESSINGS) IMPLANT
BNDG GAUZE ELAST 4 BULKY (GAUZE/BANDAGES/DRESSINGS) ×2 IMPLANT
COVER SURGICAL LIGHT HANDLE (MISCELLANEOUS) ×4 IMPLANT
CUFF TOURN SGL QUICK 18X4 (TOURNIQUET CUFF) ×2 IMPLANT
DRSG PAD ABDOMINAL 8X10 ST (GAUZE/BANDAGES/DRESSINGS) ×2 IMPLANT
ELECT REM PT RETURN 9FT ADLT (ELECTROSURGICAL) ×2
ELECTRODE REM PT RTRN 9FT ADLT (ELECTROSURGICAL) ×1 IMPLANT
GAUZE SPONGE 4X4 12PLY STRL (GAUZE/BANDAGES/DRESSINGS) ×2 IMPLANT
GLOVE SURG ENC MOIS LTX SZ7 (GLOVE) ×2 IMPLANT
GLOVE SURG UNDER POLY LF SZ7.5 (GLOVE) ×2 IMPLANT
GOWN STRL REUS W/ TWL LRG LVL3 (GOWN DISPOSABLE) ×2 IMPLANT
GOWN STRL REUS W/TWL LRG LVL3 (GOWN DISPOSABLE) ×2
HANDPIECE INTERPULSE COAX TIP (DISPOSABLE) ×1
IV NS 1000ML (IV SOLUTION) ×1
IV NS 1000ML BAXH (IV SOLUTION) ×1 IMPLANT
KIT BASIN OR (CUSTOM PROCEDURE TRAY) ×2 IMPLANT
KIT TURNOVER KIT B (KITS) ×2 IMPLANT
MANIFOLD NEPTUNE II (INSTRUMENTS) ×2 IMPLANT
NEEDLE 22X1 1/2 (OR ONLY) (NEEDLE) IMPLANT
NS IRRIG 1000ML POUR BTL (IV SOLUTION) ×2 IMPLANT
PACK ORTHO EXTREMITY (CUSTOM PROCEDURE TRAY) ×2 IMPLANT
PAD ARMBOARD 7.5X6 YLW CONV (MISCELLANEOUS) ×4 IMPLANT
SET HNDPC FAN SPRY TIP SCT (DISPOSABLE) ×1 IMPLANT
SYR CONTROL 10ML LL (SYRINGE) IMPLANT
TOWEL GREEN STERILE (TOWEL DISPOSABLE) ×2 IMPLANT

## 2020-10-01 NOTE — Anesthesia Postprocedure Evaluation (Signed)
Anesthesia Post Note  Patient: Roshaun Pound  Procedure(s) Performed: RIGHT 2nd TOE AMPUTATION (Right)     Patient location during evaluation: PACU Anesthesia Type: MAC Level of consciousness: awake Pain management: pain level controlled Vital Signs Assessment: post-procedure vital signs reviewed and stable Respiratory status: spontaneous breathing, nonlabored ventilation, respiratory function stable and patient connected to nasal cannula oxygen Cardiovascular status: stable and blood pressure returned to baseline Postop Assessment: no apparent nausea or vomiting Anesthetic complications: no   No notable events documented.  Last Vitals:  Vitals:   10/01/20 0958 10/01/20 1120  BP: 120/69 97/65  Pulse: 76 73  Resp:  18  Temp:  36.9 C  SpO2:  95%    Last Pain:  Vitals:   10/01/20 1120  TempSrc: Oral  PainSc: 0-No pain                 Damonica Chopra P Arrow [place]

## 2020-10-01 NOTE — Anesthesia Procedure Notes (Signed)
Procedure Name: MAC Date/Time: 10/01/2020 7:22 AM Performed by: Inda Coke, CRNA Pre-anesthesia Checklist: Patient identified, Emergency Drugs available, Suction available, Timeout performed and Patient being monitored Patient Re-evaluated:Patient Re-evaluated prior to induction Oxygen Delivery Method: Simple face mask Induction Type: IV induction Dental Injury: Teeth and Oropharynx as per pre-operative assessment

## 2020-10-01 NOTE — Discharge Instructions (Addendum)
Dear Jon Jones,  Thank you for letting us participate in your care. You were hospitalized for a bone infection of your second right toe and diagnosed with osteomyelitis. You had the toe amputated.  You will need to take a 14-day course of an antibiotic called Bactrim and follow-up with podiatry in 1 week.  POST-HOSPITAL & CARE INSTRUCTIONS Take your antibiotic as prescribed You will not need a bandage change before following up with podiatry in 1 week Go to your follow up appointments (listed below)   DOCTOR'S APPOINTMENT   Future Appointments  Date Time Provider Department Center  10/08/2020 11:30 AM [name], DPM [facility]  10/27/2020 10:30 AM Kerrin Champagne, MD [facility] None  10/28/2020  9:30 AM Shamleffer, Konrad Dolores, MD [facility] None  11/02/2020  9:30 AM Rennis Chris, MD [facility] None  12/14/2020  9:00 AM Lennette Bihari, MD [facility]  01/19/2021  9:20 AM [name], MD [facility]     Take care and be well!  Family Medicine Teaching Service Inpatient Team [place]  [facility]  [address] [place], [place] [address] [phone]

## 2020-10-01 NOTE — Transfer of Care (Signed)
Immediate Anesthesia Transfer of Care Note  [name]  Procedure(s) Performed: RIGHT 2nd TOE AMPUTATION (Right)  Patient Location: PACU  Anesthesia Type:MAC  Level of Consciousness: awake, alert  and oriented  Airway & Oxygen Therapy: Patient Spontanous Breathing and Patient connected to nasal cannula oxygen  Post-op Assessment: Report given to RN and Post -op Vital signs reviewed and stable  Post vital signs: Reviewed and stable  Last Vitals:  Vitals Value Taken Time  BP    Temp    Pulse 80 10/01/20 0752  Resp 19 10/01/20 0752  SpO2 93 % 10/01/20 0752  Vitals shown include unvalidated device data.  Last Pain:  Vitals:   10/01/20 0426  TempSrc: Oral  PainSc:          Complications: No notable events documented.

## 2020-10-01 NOTE — Interval H&P Note (Signed)
History and Physical Interval Note:  10/01/2020 7:12 AM  Jon Jones  has presented today for surgery, with the diagnosis of Toe.  The various methods of treatment have been discussed with the patient and family. After consideration of risks, benefits and other options for treatment, the patient has consented to  Procedure(s): RIGHT 2nd TOE AMPUTATION (Right) as a surgical intervention.  The patient's history has been reviewed, patient examined, no change in status, stable for surgery.  I have reviewed the patient's chart and labs.  Questions were answered to the patient's satisfaction.     [name]

## 2020-10-01 NOTE — Hospital Course (Addendum)
Jon Jones is a 65 y.o. male who presented with a persistent wound on the second right toe. PMH is significant for type 2 diabetes, PAD, CAD, HFrEF, implanted cardiac defibrillator, and previously had infection and amputation of the left small toe.  Right foot second toe ulcer After using some topical antifungal ointment on his it toes, his second right toe began to turn black, skin started to slough off about 2 weeks ago.  He received doxycycline from urgent care.  Toe worsened and podiatry sent him to ED on 7/20.  Second right toe had a necrotic and oozing ulceration with the fat layer exposed.  On admission he was afebrile with a normal WBC count.  He was given 4 L  LR fluid bolus and started on vancomycin and Zosyn.  MRI of the foot confirmed osteomyelitis of the second right toe.  Podiatry amputated the toe on 7/22, prescribed a 14-day dose of Bactrim, and recommended a 1 week follow-up with them.  PAD ABIs showed mild concern for peripheral vascular disease.  Podiatry stated they will monitor and send patient to vascular surgery if follow-up is needed.  He received aspirin 81 mg daily and Plavix 75 mg daily during his stay.   Type 2 diabetes Blood Glucose in ED was 345.  He was given SSI and Tradjenta 5 mg daily during his hospital stay.  CBG at discharge was 257. Recommend following up with PCP in regards to blood sugar control.

## 2020-10-01 NOTE — Progress Notes (Signed)
Per previous RN, patient needs further education on the procedure before  he can sign the consent form.

## 2020-10-01 NOTE — Progress Notes (Signed)
Orthopedic Tech Progress Note Patient Details:  Jon Jones 1956-03-05 [id number]  Ortho Devices Type of Ortho Device: Postop shoe/boot Ortho Device/Splint Location: R LE post op shoe Ortho Device/Splint Interventions: Ordered, Application   Post Interventions Patient Tolerated: Well Instructions Provided: Adjustment of device, Care of device   Med post op shoe applied.  Orders for shoe and boot, shoe seemed more appropriate given his toes were amputated.    Thanks,  Corinna Capra, PT, DPT  Acute Rehabilitation Ortho Tech Supervisor [phone] pager #[phone] office    Rollene Rotunda Whitnee Orzel 10/01/2020, 3:25 PM

## 2020-10-01 NOTE — Progress Notes (Signed)
Pt discharged home , Iv and tele removed. All belongings sent with pt. All d/c instructions given and explained to pt. All new meds sent with pt. All questions and concerns addressed with pt.

## 2020-10-01 NOTE — Progress Notes (Signed)
Patient transported to OR. Assessments remained unchanged.

## 2020-10-01 NOTE — Op Note (Signed)
Surgeon: Surgeon(s): [name], DPM  Assistants: None Pre-operative diagnosis: Toe  Post-operative diagnosis: same Procedure: Procedure(s) (LRB): RIGHT 2nd TOE AMPUTATION (Right)  Pathology:  ID Type Source Tests Collected by Time Destination  1 : Right second toe Tissue PATH Digit amputation SURGICAL PATHOLOGY [name], DPM 10/01/2020 0736     Pertinent Intra-op findings: Right second digit osteomyelitis noted with soft friable bone Anesthesia: General  Hemostasis: * No tourniquets in log * EBL: 10 mL  Materials: 3-0 Prolene Injectables: 10 cc of half percent Marcaine plain Complications: None  Indications for surgery: A 65 y.o. male presents with right second digit osteomyelitis. Patient has failed all conservative therapy including but not limited to local wound care and IV antibiotics. He wishes to have surgical correction of the foot/deformity. It was determined that patient would benefit from right second digit partial amputation. Informed surgical risk consent was reviewed and read aloud to the patient.  I reviewed the films.  I have discussed my findings with the patient in great detail.  I have discussed all risks including but not limited to infection, stiffness, scarring, limp, disability, deformity, damage to blood vessels and nerves, numbness, poor healing, need for braces, arthritis, chronic pain, amputation, death.  All benefits and realistic expectations discussed in great detail.  I have made no promises as to the outcome.  I have provided realistic expectations.  I have offered the patient a 2nd opinion, which they have declined and assured me they preferred to proceed despite the risks   Procedure in detail: The patient was both verbally and visually identified by myself, the nursing staff, and anesthesia staff in the preoperative holding area. They were then transferred to the operating room and placed on the operative table in supine position.  Attention was  directed to the right second digit, using skin marker the incision was delineated with a fishmouth style incision.  Using a #15 blade incision was carried down from epidermis down to the dermal layer.  Incision was further carried down to the level of the bone.  At this time a bone cutter was used to make a cut at the base of the proximal phalanx.  The second digit was disarticulated and sent off to pathology in standard sterile make-up technique.  The proximal margin appeared clear of infection.  It was hard indurated bone.  The wound was thoroughly irrigated with normal saline solution.  At this time it was determined the patient would benefit from primary closure.  Using 3-0 Prolene the incision was primary closed simple interrupted suture technique.  The incision was dressed with Xeroform, Betadine wet-to-dry dressing Kerlix Ace bandage all bony prominences were adequately padded.  At the conclusion of the procedure the patient was awoken from anesthesia and found to have tolerated the procedure well any complications. There were transferred to PACU with vital signs stable and vascular status intact.  Nicholes Rough, DPM

## 2020-10-01 NOTE — Progress Notes (Signed)
FPTS Brief Progress Note  S:Mr. Jon Jones was sleeping comfortably in bed. His MEWs score was 0.   O: BP 110/61 (BP Location: Left Arm)   Pulse 80   Temp 98.1 F (36.7 C) (Oral)   Resp 16   Wt 108 kg   SpO2 97%   BMI 38.43 kg/m   General: Sleeping comfortably in bed, NAD Respiratory: breathing comfortably in room air   A/P: Right second toe osteomyelitis  Patient presented with concerns for osteomyelitis.  MRI was confirmatory of osteomyelitis of the right second toe. -Podiatry following, Appreciate recs -N.p.o. at midnight -Continue vancomycin daily -Continue Zosyn daily -Acetaminophen 650 q6h prn -Potential scheduled for today with podiatry  T2DM Patient's home medication include Januvia, glipizide, Jardiance, but metformin, and gabapentin.  CBG tonight was 282 -Continue monitoring CBG -Continue Tradjenta 5 mg daily -Continue sliding scale insulin   Jerre Simon, MD 10/01/2020, 1:53 AM PGY-1, [facility] Family Medicine Night Resident  Please page [phone] with questions.

## 2020-10-01 NOTE — Consult Note (Signed)
Subjective:  Patient ID: Jon Jones, male    DOB: 07-May-1955,  MRN: [id number]  Chief Complaint  Patient presents with   Wound Check    65 y.o. male presents for wound care.  Patient presents with past medical history of type 2 diabetes, hypertension, OSA, peripheral arterial disease, coronary artery disease, heart failure with reduced ejection fraction who presents with 2 to 3 weeks of an ulcer on his right toe osteomyelitis.  He was seen by me in the clinic and was directly admitted for amputation of the right second digit and IV antibiotics.  He states he is doing well today at bedside.  No nausea fever chills vomiting.  His ABIs PVRs are within normal limits with mild disease.  Overall he is in good spirits   Review of Systems: Negative except as noted in the HPI. Denies N/V/F/Ch.  Past Medical History:  Diagnosis Date   5 years ago    AICD (automatic cardioverter/defibrillator) present    Anxiety    Arthritis    Cataract    CHF (congestive heart failure) ([name])    Coronary artery disease    Diabetes mellitus without complication ([name])    Diabetic retinopathy ([name])    Dysrhythmia    GERD (gastroesophageal reflux disease)    PMH   Hypertension    Hypertensive retinopathy    Peripheral vascular disease ([name])    Peripheral vascular disease ([name])    Pneumonia    Shoulder impingement, right    Sleep apnea    wears cpap   Stomach ulcer    Wears glasses     Current Facility-Administered Medications:    [MAR Hold] acetaminophen (TYLENOL) tablet 650 mg, 650 mg, Oral, Q6H PRN **OR** [MAR Hold] acetaminophen (TYLENOL) suppository 650 mg, 650 mg, Rectal, Q6H PRN, Welborn, Ryan, DO   [MAR Hold] albuterol (PROVENTIL) (2.5 MG/3ML) 0.083% nebulizer solution 3 mL, 3 mL, Inhalation, Q6H PRN, Welborn, Ryan, DO   [MAR Hold] aspirin EC tablet 81 mg, 81 mg, Oral, Daily, Welborn, Ryan, DO, 81 mg at 09/30/20 0838   [MAR Hold] atorvastatin (LIPITOR) tablet 80 mg, 80 mg, Oral, QPM, Welborn,  Ryan, DO, 80 mg at 09/30/20 1755   [MAR Hold] clopidogrel (PLAVIX) tablet 75 mg, 75 mg, Oral, Daily, Welborn, Ryan, DO, 75 mg at 09/30/20 0838   [MAR Hold] cycloSPORINE (RESTASIS) 0.05 % ophthalmic emulsion 1 drop, 1 drop, Both Eyes, QHS PRN, Welborn, Ryan, DO   [facility] Hold] gabapentin (NEURONTIN) capsule 300 mg, 300 mg, Oral, QHS, Welborn, Ryan, DO, 300 mg at 09/30/20 2155   [facility] Hold] heparin injection 5,000 Units, 5,000 Units, Subcutaneous, Q8H, Welborn, Ryan, DO, 5,000 Units at 09/30/20 2155   [facility] Hold] insulin aspart (novoLOG) injection 0-9 Units, 0-9 Units, Subcutaneous, TID WC, Welborn, Ryan, DO, 2 Units at 10/01/20 0630   [MAR Hold] linagliptin (TRADJENTA) tablet 5 mg, 5 mg, Oral, Daily, Welborn, Ryan, DO, 5 mg at 09/30/20 0848   [MAR Hold] metoprolol succinate (TOPROL-XL) 24 hr tablet 100 mg, 100 mg, Oral, Daily, Welborn, Ryan, DO, 100 mg at 09/30/20 0838   [MAR Hold] nitroGLYCERIN (NITROSTAT) SL tablet 0.4 mg, 0.4 mg, Sublingual, Q5 min PRN, Welborn, Ryan, DO   [MAR Hold] piperacillin-tazobactam (ZOSYN) IVPB 3.375 g, 3.375 g, Intravenous, Q8H, Leander Rams, RPH, Last Rate: 12.5 mL/hr at 10/01/20 0201, 3.375 g at 10/01/20 0201   [MAR Hold] sacubitril-valsartan (ENTRESTO) 24-26 mg per tablet, 1 tablet, Oral, BID, Welborn, Ryan, DO, 1 tablet at 09/30/20 2231   [MAR Hold] spironolactone (  ALDACTONE) tablet 12.5 mg, 12.5 mg, Oral, Daily, Welborn, Ryan, DO, 12.5 mg at 09/30/20 0839   [MAR Hold] vancomycin (VANCOREADY) IVPB 1250 mg/250 mL, 1,250 mg, Intravenous, Q24H, Rumbarger, Faye Ramsay, RPH, Last Rate: 166.7 mL/hr at 10/01/20 0459, 1,250 mg at 10/01/20 0459  Social History   Tobacco Use  Smoking Status Former   Packs/day: 1.00   Years: 40.00   Pack years: 40.00   Types: Cigarettes   Quit date: 03/11/2019   Years since quitting: 1.5  Smokeless Tobacco Never    No Known Allergies Objective:   Vitals:   09/30/20 2343 10/01/20 0426  BP: 110/61 124/70  Pulse: 80 79  Resp: 16 18   Temp: 98.1 F (36.7 C) 98.6 F (37 C)  SpO2: 97% 98%   Body mass index is 38.43 kg/m. Constitutional Well developed. Well nourished.  Vascular Dorsalis pedis pulses nonpalpable bilaterally. Posterior tibial pulses non palpable bilaterally. Capillary refill n diminished o all digits.  No cyanosis or clubbing noted. Pedal hair growth none  Neurologic Normal speech. Oriented to person, place, and time. Protective sensation absent  Dermatologic Wound Location: Right second digit with fat layer exposed.  Has regressed and is now probing down to bone.  Probes to bone.  Mild purulent drainage noted.  No malodor present.  Epidermal lysis of the circumferential skin of the second digit noted Wound Base: Mixed Granular/Fibrotic Peri-wound: Calloused Exudate: Scant/small amount Serosanguinous exudate Wound Measurements: -See below  Orthopedic: No pain to palpation either foot.   Radiographs: Mild cortical irregularity noted at the distal phalanx of the right second digit.  These findings consistent with osteomyelitic changes Assessment:   1. Gangrene of toe ([name])   2. Diabetic foot infection ([name])   3. Elevated lactic acid level   4. Hyperglycemia   5. Pacemaker    Plan:  Patient was evaluated and treated and all questions answered.  Ulcer right second digit with fat layer exposed with underlying probing down to bone concern for osteomyelitis -All questions and concerns were directly addressed -Decision was made to take to the patient to the operating room today given the nature of the second digit with findings in the MRI for osteomyelitis. -I discussed the MRI findings in extensive detail with the patient.  I believe patient will benefit from surgical amputation of the right second digit partially. -He has been n.p.o. after midnight. -ABIs PVRs were reviewed with the patient which showed mild concern for peripheral vascular disease.  I will clinically monitor and if any  interventions required patient will follow-up as an outpatient to vascular surgery. -He will not need any dressing change after the surgery. -He can be discharged on p.o. antibiotics for 14 days Bactrim given that he has failed outpatient doxycycline -He will follow-up 1 week from discharge in our clinic.    No follow-ups on file.

## 2020-10-01 NOTE — Discharge Summary (Addendum)
[facility] Discharge Summary  Patient name: Jon Jones Medical record number: [id number] Date of birth: 1955/04/13 Age: 65 y.o. Gender: male Date of Admission: 09/29/2020  Date of Discharge: 10/01/2020 Admitting Physician: Lenoria Chime, MD  Primary Care Provider: Ladell Pier, MD Consultants: Podiatry  Indication for Hospitalization: Necrotic toe  Discharge Diagnoses/Problem List:  CAD HFrEF Hypertension Diabetes type 2 Microalbuminuria Peripheral arterial disease OSA on CPAP Chronic gout of elbows  Disposition: Home  Discharge Condition: Stable  Discharge Exam:  Blood pressure 97/65, pulse 73, temperature 98.5 F (36.9 C), temperature source Oral, resp. rate 18, weight 108 kg, SpO2 95 %.  General: Patient sitting up in bed, eating eggs and bacon, no acute distress Cardiology: Patient denies any chest pain or shortness of breath Respiratory: No work of breathing on room air Neuro: Alert and oriented Psychiatric: Affect appropriate for situation.  Brief Hospital Course:  Jon Jones is a 65 y.o. male who presented with a persistent wound on the second right toe. PMH is significant for type 2 diabetes, PAD, CAD, HFrEF, implanted cardiac defibrillator, and previously had infection and amputation of the left small toe.  Right foot second toe ulcer After using some topical antifungal ointment on his it toes, his second right toe began to turn black, skin started to slough off about 2 weeks ago.  He received doxycycline from urgent care.  Toe worsened and podiatry sent him to ED on 7/20.  Second right toe had a necrotic and oozing ulceration with the fat layer exposed.  On admission he was afebrile with a normal WBC count.  He was given 4 L  LR fluid bolus and started on vancomycin and Zosyn.  MRI of the foot confirmed osteomyelitis of the second right toe.  Podiatry amputated the toe on 7/22, prescribed a 14-day dose of Bactrim, and recommended  a 1 week follow-up with them.  PAD ABIs showed mild concern for peripheral vascular disease.  Podiatry stated they will monitor and send patient to vascular surgery if follow-up is needed.  He received aspirin 81 mg daily and Plavix 75 mg daily during his stay.   Type 2 diabetes Blood Glucose in ED was 345.  He was given SSI and Tradjenta 5 mg daily during his hospital stay.  CBG at discharge was 257. Recommend following up with PCP in regards to blood sugar control.    Issues for Follow Up:  Follow-up in 1 week with podiatry regarding toe amputation. Continue to follow with podiatry regarding mild peripheral vascular disease Follow-up with PCP for health maintenance, specifically blood sugar control  Significant Procedures: Right second toe amputation with podiatry 7/22  Significant Labs and Imaging:  Recent Labs  Lab 09/29/20 0925 09/30/20 0456 10/01/20 0508  WBC 9.6 8.4 7.4  HGB 13.9 12.0* 12.8*  HCT 43.3 36.6* 40.1  PLT 217 170 202   Recent Labs  Lab 09/29/20 0925 09/30/20 0456 10/01/20 0508  NA 134* 136 136  K 4.5 4.0 3.7  CL 103 106 104  CO2 _0 GLUCOSE 345* 134* 188*  BUN _1 CREATININE 1.28* 0.96 1.13  CALCIUM 9.7 9.0 9.3  ALKPHOS 59  --   --   AST 26  --   --   ALT 22  --   --   ALBUMIN 3.5  --   --     PORTABLE CHEST 1 VIEW 09/30/20  COMPARISON:  None. FINDINGS: Dual-chamber pacer/ICD from the left. No visible  fractured or abandoned lead. There is no edema, consolidation, effusion, or pneumothorax. Normal heart size and mediastinal contours. Artifact from EKG leads IMPRESSION: 1. Dual-chamber AICD. No visible fractured or abandoned lead. 2. No evidence of active disease.  MRI OF THE RIGHT FOREFOOT WITHOUT CONTRAST 09/30/20 TECHNIQUE: Multiplanar, multisequence MR imaging of the right forefoot was performed. No intravenous contrast was administered. COMPARISON:  Right foot x-rays dated September 30, 2019. FINDINGS: Bones/Joint/Cartilage Marrow edema with corresponding decreased T1 marrow signal involving the second distal phalanx, consistent with osteomyelitis. No fracture or dislocation. Minimal degenerative changes of the first MTP joint. Small amount of fluid in the fifth toe nail bed. No joint effusion. Ligaments Collateral ligaments are intact.  Lisfranc ligament is intact. Muscles and Tendons Flexor and extensor tendons are intact. Increased T2 signal within the intrinsic muscles of the forefoot, nonspecific, but likely related to diabetic muscle changes. Soft tissue Soft tissue swelling of the second toe with small ulceration at the tip. Dorsal foot soft tissue swelling, worse laterally. No fluid collection or hematoma. No soft tissue mass. IMPRESSION: 1. Small ulceration at the tip of the second toe with underlying osteomyelitis of the second distal phalanx.  Results/Tests Pending at Time of Discharge: Blood cultures no growth 2 days on day of discharge  Discharge Medications:  Allergies as of 10/01/2020   No Known Allergies      Medication List     STOP taking these medications    doxycycline 100 MG capsule Commonly known as: VIBRAMYCIN   doxycycline 100 MG tablet Commonly known as: VIBRA-TABS       TAKE these medications    Accu-Chek Aviva Plus test strip Generic drug: glucose blood Test BS 3x/day with meals What changed:  how much to take how to take this when to take this   Accu-Chek Guide Me w/Device Kit Check blood sugar once daily. What changed:  how much to take how to take this when to take this   Accu-Chek Softclix Lancets lancets CHECK BLOOD SUGAR ONCE DAILY What changed: See the new instructions.   acetaminophen 325 MG tablet Commonly known as: TYLENOL Take 2 tablets (650 mg total) by mouth every 6 (six) hours as needed for mild pain (or Fever >/= 101).   aspirin 81 MG EC tablet Take 81 mg by mouth daily.   atorvastatin  80 MG tablet Commonly known as: LIPITOR Take 1 tablet (80 mg total) by mouth every evening.   ciclopirox 8 % solution Commonly known as: Penlac Apply topically at bedtime. Apply over nail and surrounding skin. Apply daily over previous coat. After seven (7) days, may remove with alcohol and continue cycle.   clobetasol ointment 0.05 % Commonly known as: TEMOVATE Apply 1 application topically 2 (two) times daily as needed. What changed: reasons to take this   clopidogrel 75 MG tablet Commonly known as: PLAVIX Take 75 mg by mouth daily.   empagliflozin 25 MG Tabs tablet Commonly known as: JARDIANCE Take 1 tablet (25 mg total) by mouth at bedtime.   Entresto 24-26 MG Generic drug: sacubitril-valsartan Take 1 tablet by mouth 2 (two) times daily.   gabapentin 300 MG capsule Commonly known as: NEURONTIN Take 1 capsule (300 mg total) by mouth at bedtime.   glipiZIDE 10 MG tablet Commonly known as: GLUCOTROL Take 2 tablets (20 mg total) by mouth 2 (two) times daily.   metFORMIN 1000 MG tablet Commonly known as: GLUCOPHAGE Take 1 tablet (1,000 mg total) by mouth 2 (two) times daily with  a meal.   metoprolol succinate 100 MG 24 hr tablet Commonly known as: TOPROL-XL Take 1 tablet (100 mg total) by mouth daily.   Misc. Devices Misc Cpap supplies-tubing mask Dx G47.33 and Z99.89   nitroGLYCERIN 0.4 MG SL tablet Commonly known as: NITROSTAT Place 0.4 mg under the tongue See admin instructions. Every 5 minutes as needed for chest pain. Max of 3 doses   nystatin cream Commonly known as: MYCOSTATIN Apply 1 application topically 3 (three) times daily as needed for dry skin.   ProAir HFA 108 (90 Base) MCG/ACT inhaler Generic drug: albuterol INHALE 2 PUFFS INTO THE LUNGS EVERY 6 HOURS AS NEEDED FOR WHEEZING OR SHORTNESS OF BREATH   Restasis 0.05 % ophthalmic emulsion Generic drug: cycloSPORINE Place 1 drop into both eyes in the morning and at bedtime.   sitaGLIPtin 100 MG  tablet Commonly known as: Januvia Take 1 tablet (100 mg total) by mouth daily.   spironolactone 25 MG tablet Commonly known as: ALDACTONE Take 0.5 tablets (12.5 mg total) by mouth daily.   sulfamethoxazole-trimethoprim 800-160 MG tablet Commonly known as: BACTRIM DS Take 1 tablet by mouth 2 (two) times daily for 14 days.   triamcinolone cream 0.1 % Commonly known as: KENALOG Apply 1 application topically 2 (two) times daily.   triamcinolone ointment 0.1 % Commonly known as: KENALOG Apply 1 application topically in the morning and at bedtime.               Discharge Care Instructions  (From admission, onward)           Start     Ordered   10/01/20 0000  Discharge wound care:       Comments: Does not need any dressing changes.  Can ambulate with a surgical shoe   10/01/20 1209            Discharge Instructions: Please refer to Patient Instructions section of EMR for full details.  Patient was counseled important signs and symptoms that should prompt return to medical care, changes in medications, dietary instructions, activity restrictions, and follow up appointments.   Follow-Up Appointments:  Follow-up Information     Ladell Pier, MD. Schedule an appointment as soon as possible for a visit in 1 week(s).   Specialty: Internal Medicine Why: Make an appointment to follow up with your primary care doctor for 1-2 weeks from discharge. Contact information: [place] [address] [phone]         Donato Heinz, MD .   Specialties: Cardiology, Radiology Contact information: [address] [place] [address] [phone]         Felipa Furnace, DPM. Schedule an appointment as soon as possible for a visit in 1 week(s).   Specialty: Podiatry Why: Call and make a hospital follow up appointment with the podiatrist who performed your surgery. Contact information: [place]  [address] [phone]                 Follow-up Information     Ladell Pier, MD. Schedule an appointment as soon as possible for a visit in 1 week(s).   Specialty: Internal Medicine Why: Make an appointment to follow up with your primary care doctor for 1-2 weeks from discharge. Contact information: [place] [address] [phone]         Donato Heinz, MD .   Specialties: Cardiology, Radiology Contact information: [address]  [name] [address] [phone]         Felipa Furnace, DPM. Schedule an appointment as soon as possible for a visit in 1 week(s).   Specialty: Podiatry Why: Call and make a hospital follow up appointment with the podiatrist who performed your surgery. Contact information: [address] [place] [address] [phone]                  Precious Gilding, DO 10/01/2020, 2:46 PM PGY-1, [facility]  Upper Level Addendum: I have reviewed the above note, making necessary revisions as appropriate. These are denoted by green text. I agree with the medical decision making and physical exam as noted above. Ezequiel Essex, MD PGY-2 [id number] Family Medicine Residency

## 2020-10-02 ENCOUNTER — Encounter (HOSPITAL_COMMUNITY): Payer: Self-pay | Admitting: Podiatry

## 2020-10-04 ENCOUNTER — Telehealth: Payer: Self-pay

## 2020-10-04 LAB — CULTURE, BLOOD (ROUTINE X 2)
Culture: NO GROWTH
Culture: NO GROWTH

## 2020-10-04 LAB — SURGICAL PATHOLOGY

## 2020-10-04 NOTE — Telephone Encounter (Signed)
Transition Care Management Unsuccessful Follow-up Telephone Call  Date of discharge and from where:  7/22/202, [facility]   Attempts:  1st Attempt  Reason for unsuccessful TCM follow-up call:  Left voice message on # [phone].  Need to schedule hospital follow up appointment.

## 2020-10-05 ENCOUNTER — Telehealth: Payer: Self-pay

## 2020-10-05 NOTE — Telephone Encounter (Signed)
Transition Care Management Unsuccessful Follow-up Telephone Call  Date of discharge and from where:  10/01/2020, [facility]   Attempts:  2nd Attempt  Reason for unsuccessful TCM follow-up call:  Left voice message on #[phone]    Need to schedule hospital follow up appointment.

## 2020-10-06 ENCOUNTER — Encounter: Payer: Self-pay | Admitting: Internal Medicine

## 2020-10-06 ENCOUNTER — Telehealth: Payer: Self-pay

## 2020-10-06 DIAGNOSIS — N471 Phimosis: Secondary | ICD-10-CM | POA: Insufficient documentation

## 2020-10-06 NOTE — Telephone Encounter (Signed)
Transition Care Management Unsuccessful Follow-up Telephone Call  Date of discharge and from where:  10/01/2020, [facility]   Attempts:  3rd Attempt  Reason for unsuccessful TCM follow-up call:  Left voice message on # [phone]  Letter sent to patient requesting he call the [facility] to schedule a hospital follow up appointment,

## 2020-10-06 NOTE — Telephone Encounter (Signed)
Pt called and reported that he already has a hosp follow up appt with the surgeon. Requesting to no longer be contacted about a hosp fu with his PCP.

## 2020-10-06 NOTE — Telephone Encounter (Signed)
Noted  

## 2020-10-07 ENCOUNTER — Other Ambulatory Visit: Payer: Self-pay | Admitting: Internal Medicine

## 2020-10-07 DIAGNOSIS — E1142 Type 2 diabetes mellitus with diabetic polyneuropathy: Secondary | ICD-10-CM

## 2020-10-07 NOTE — Telephone Encounter (Signed)
Call returned to patient. He said he is doing " dandy" and has an appointment with his surgeon tomorrow.  He does not think that he needs to schedule a hospital follow up appointment with PCP at this time.  Instructed him to call this clinic if he has any questions for Dr Laural Benes

## 2020-10-07 NOTE — Telephone Encounter (Signed)
Pt called and asked to speak with Jane/ please advise

## 2020-10-07 NOTE — Telephone Encounter (Signed)
Pt is aware Erskine Squibb is on a call and will call him back

## 2020-10-08 ENCOUNTER — Ambulatory Visit: Payer: Medicaid Other | Admitting: Podiatry

## 2020-10-08 ENCOUNTER — Other Ambulatory Visit: Payer: Self-pay

## 2020-10-08 DIAGNOSIS — Z89421 Acquired absence of other right toe(s): Secondary | ICD-10-CM

## 2020-10-11 DIAGNOSIS — R32 Unspecified urinary incontinence: Secondary | ICD-10-CM | POA: Diagnosis not present

## 2020-10-11 DIAGNOSIS — E119 Type 2 diabetes mellitus without complications: Secondary | ICD-10-CM | POA: Diagnosis not present

## 2020-10-13 ENCOUNTER — Other Ambulatory Visit: Payer: Self-pay | Admitting: Urology

## 2020-10-13 ENCOUNTER — Telehealth: Payer: Self-pay | Admitting: Cardiology

## 2020-10-13 ENCOUNTER — Encounter: Payer: Self-pay | Admitting: Podiatry

## 2020-10-13 ENCOUNTER — Other Ambulatory Visit: Payer: Self-pay | Admitting: Internal Medicine

## 2020-10-13 NOTE — Telephone Encounter (Signed)
   [facility] Pre-operative Risk Assessment    Patient Name: Jon Jones  DOB: April 24, 1955 MRN: [id number]  HEARTCARE STAFF:  - IMPORTANT!!!!!! Under Visit Info/Reason for Call, type in Other and utilize the format Clearance MM/DD/YY or Clearance TBD. Do not use dashes or single digits. - Please review there is not already an duplicate clearance open for this procedure. - If request is for dental extraction, please clarify the # of teeth to be extracted. - If the patient is currently at the dentist's office, call Pre-Op Callback Staff (MA/nurse) to input urgent request.  - If the patient is not currently in the dentist office, please route to the Pre-Op pool.  Request for surgical clearance:  What type of surgery is being performed? A circumcision  When is this surgery scheduled? 11/05/2020  What type of clearance is required (medical clearance vs. Pharmacy clearance to hold med vs. Both)? Both  Are there any medications that need to be held prior to surgery and how long? Playvix and for 5 days  Practice name and name of physician performing surgery? DR.John Jeffie Pollock and [facility]  What is the office phone number? [phone]   7.   What is the office fax number? [phone]  8.   Anesthesia type (None, local, MAC, general) ? General   Jon Jones 10/13/2020, 12:57 PM  _________________________________________________________________   (provider comments below)

## 2020-10-13 NOTE — Telephone Encounter (Signed)
Requested Prescriptions  Pending Prescriptions Disp Refills  . PROAIR HFA 108 (90 Base) MCG/ACT inhaler [Pharmacy Med Name: PROAIR HFA ORAL INH (200  PFS) 8.5G] 8.5 g 1    Sig: INHALE 2 PUFFS INTO THE LUNGS EVERY 6 HOURS AS NEEDED FOR WHEEZING OR SHORTNESS OF BREATH     Pulmonology:  Beta Agonists Failed - 10/13/2020  3:18 AM      Failed - One inhaler should last at least one month. If the patient is requesting refills earlier, contact the patient to check for uncontrolled symptoms.      Passed - Valid encounter within last 12 months    Recent Outpatient Visits          1 month ago Type 2 diabetes mellitus with peripheral neuropathy [facility])   [facility] Marcine Matar, MD   5 months ago Establishing care with new doctor, encounter for   [facility] Marcine Matar, MD      Future Appointments            In 2 months Lennette Bihari, MD [facility] [place], [place]   In 3 months [name], MD [facility] [place], [facility]

## 2020-10-13 NOTE — Telephone Encounter (Signed)
   Name: Jon Jones  DOB: [birth year]  MRN: [id number]   Primary Cardiologist: [name], MD  Chart reviewed as part of pre-operative protocol coverage. Patient was contacted 10/13/2020 in reference to pre-operative risk assessment for pending surgery as outlined below.  Zakai Gonyea was last seen on 08/30/2020 by Dr. [name].  Since that day, Matisse Salais has done well without any chest pain or worsening dyspnea.  He may hold Plavix for 5 days prior to the procedure and restart as soon as possible.  If needed, he may also hold aspirin for 5 days as well prior to the surgery and restart as soon as possible afterward at the surgeon's discretion.  Therefore, based on ACC/AHA guidelines, the patient would be at acceptable risk for the planned procedure without further cardiovascular testing.   The patient was advised that if he develops new symptoms prior to surgery to contact our office to arrange for a follow-up visit, and he verbalized understanding.  I will route this recommendation to the requesting party via Epic fax function and remove from pre-op pool. Please call with questions.  [place], [place] 10/13/2020, 4:14 PM

## 2020-10-13 NOTE — Progress Notes (Signed)
Subjective:  Patient ID: Jon Jones, male    DOB: 29-Jan-1956,  MRN: [id number]  Chief Complaint  Patient presents with   Foot Ulcer    Toe ulcer     DOS: 10/01/2020 Procedure: Right second digit amputation  65 y.o. male returns for post-op check.  Patient is doing well.  Bandages are clean dry and intact.  He denies any other acute complaints.  He has not done any dressing changes.  He has completed course of antibiotics.  Review of Systems: Negative except as noted in the HPI. Denies N/V/F/Ch.  Past Medical History:  Diagnosis Date   5 years ago    AICD (automatic cardioverter/defibrillator) present    Anxiety    Arthritis    Cataract    CHF (congestive heart failure) ([name])    Coronary artery disease    Diabetes mellitus without complication ([name])    Diabetic retinopathy ([place])    Dysrhythmia    GERD (gastroesophageal reflux disease)    PMH   Hypertension    Hypertensive retinopathy    Peripheral vascular disease ([name])    Peripheral vascular disease ([name])    Pneumonia    Shoulder impingement, right    Sleep apnea    wears cpap   Stomach ulcer    Wears glasses     Current Outpatient Medications:    Accu-Chek Softclix Lancets lancets, CHECK BLOOD SUGAR ONCE DAILY, Disp: 100 each, Rfl: 2   acetaminophen (TYLENOL) 325 MG tablet, Take 2 tablets (650 mg total) by mouth every 6 (six) hours as needed for mild pain (or Fever >/= 101)., Disp: , Rfl:    aspirin 81 MG EC tablet, Take 81 mg by mouth daily., Disp: , Rfl:    atorvastatin (LIPITOR) 80 MG tablet, Take 1 tablet (80 mg total) by mouth every evening., Disp: 90 tablet, Rfl: 3   Blood Glucose Monitoring Suppl (ACCU-CHEK GUIDE ME) w/Device KIT, Check blood sugar once daily., Disp: 1 kit, Rfl: 0   ciclopirox (PENLAC) 8 % solution, Apply topically at bedtime. Apply over nail and surrounding skin. Apply daily over previous coat. After seven (7) days, may remove with alcohol and continue cycle., Disp: 6.6 mL, Rfl: 3    clobetasol ointment (TEMOVATE) 7.85 %, Apply 1 application topically 2 (two) times daily as needed., Disp: 30 g, Rfl: 0   clopidogrel (PLAVIX) 75 MG tablet, Take 75 mg by mouth daily., Disp: , Rfl:    empagliflozin (JARDIANCE) 25 MG TABS tablet, Take 1 tablet (25 mg total) by mouth at bedtime., Disp: 90 tablet, Rfl: 2   gabapentin (NEURONTIN) 300 MG capsule, TAKE 1 CAPSULE(300 MG) BY MOUTH AT BEDTIME, Disp: 30 capsule, Rfl: 5   glipiZIDE (GLUCOTROL) 10 MG tablet, Take 2 tablets (20 mg total) by mouth 2 (two) times daily., Disp: 360 tablet, Rfl: 2   glucose blood (ACCU-CHEK AVIVA PLUS) test strip, Test BS 3x/day with meals, Disp: 100 each, Rfl: 3   metFORMIN (GLUCOPHAGE) 1000 MG tablet, Take 1 tablet (1,000 mg total) by mouth 2 (two) times daily with a meal., Disp: 180 tablet, Rfl: 2   metoprolol succinate (TOPROL-XL) 100 MG 24 hr tablet, Take 1 tablet (100 mg total) by mouth daily., Disp: 90 tablet, Rfl: 3   Misc. Devices MISC, Cpap supplies-tubing mask Dx G47.33 and Z99.89, Disp: 1 Device, Rfl: 0   nitroGLYCERIN (NITROSTAT) 0.4 MG SL tablet, Place 0.4 mg under the tongue See admin instructions. Every 5 minutes as needed for chest pain. Max of 3 doses, Disp: ,  Rfl:    nystatin cream (MYCOSTATIN), Apply 1 application topically 3 (three) times daily as needed for dry skin., Disp: , Rfl:    PROAIR HFA 108 (90 Base) MCG/ACT inhaler, INHALE 2 PUFFS INTO THE LUNGS EVERY 6 HOURS AS NEEDED FOR WHEEZING OR SHORTNESS OF BREATH, Disp: 8.5 g, Rfl: 1   RESTASIS 0.05 % ophthalmic emulsion, Place 1 drop into both eyes in the morning and at bedtime., Disp: , Rfl:    sacubitril-valsartan (ENTRESTO) 24-26 MG, Take 1 tablet by mouth 2 (two) times daily., Disp: 60 tablet, Rfl: 11   sitaGLIPtin (JANUVIA) 100 MG tablet, Take 1 tablet (100 mg total) by mouth daily., Disp: 90 tablet, Rfl: 2   spironolactone (ALDACTONE) 25 MG tablet, Take 0.5 tablets (12.5 mg total) by mouth daily., Disp: 45 tablet, Rfl: 3    sulfamethoxazole-trimethoprim (BACTRIM DS) 800-160 MG tablet, Take 1 tablet by mouth 2 (two) times daily for 14 days., Disp: 28 tablet, Rfl: 0   triamcinolone (KENALOG) 0.1 %, Apply 1 application topically 2 (two) times daily., Disp: , Rfl:    triamcinolone ointment (KENALOG) 0.1 %, Apply 1 application topically in the morning and at bedtime., Disp: , Rfl:   Social History   Tobacco Use  Smoking Status Former   Packs/day: 1.00   Years: 40.00   Pack years: 40.00   Types: Cigarettes   Quit date: 03/11/2019   Years since quitting: 1.5  Smokeless Tobacco Never    No Known Allergies Objective:  There were no vitals filed for this visit. There is no height or weight on file to calculate BMI. Constitutional Well developed. Well nourished.  Vascular Foot warm and well perfused. Capillary refill normal to all digits.   Neurologic Normal speech. Oriented to person, place, and time. Epicritic sensation to light touch grossly present bilaterally.  Dermatologic Skin healing well without signs of infection. Skin edges well coapted without signs of infection.  Orthopedic: Tenderness to palpation noted about the surgical site.   Radiographs: None Assessment:   1. History of amputation of lesser toe of right foot ([place])    Plan:  Patient was evaluated and treated and all questions answered.  S/p foot surgery right -Progressing as expected post-operatively. -XR: None -WB Status: Weightbearing as tolerated in surgical shoe -Sutures: Intact.  No clinical signs of infection noted.  No dehiscence noted -Medications: None -Foot redressed.  No follow-ups on file.

## 2020-10-15 ENCOUNTER — Ambulatory Visit: Payer: Medicaid Other | Admitting: Podiatry

## 2020-10-15 ENCOUNTER — Encounter: Payer: Self-pay | Admitting: Podiatry

## 2020-10-15 ENCOUNTER — Other Ambulatory Visit: Payer: Self-pay

## 2020-10-15 DIAGNOSIS — Z89421 Acquired absence of other right toe(s): Secondary | ICD-10-CM | POA: Diagnosis not present

## 2020-10-15 NOTE — Progress Notes (Addendum)
COVID Vaccine Completed: yes x2 Date COVID Vaccine completed: 05/20/19, 12/23/19 Has received booster: COVID vaccine manufacturer: Pfizer    Date of COVID positive in last 90 days: No  PCP - Jonah Blue, MD Cardiologist - Campbell Stall, MD  Cardiac Clearance by [place] Course  dated 10/13/20  Chest x-ray - N/a EKG - 05/21/20 Epic Stress Test - reports yes ECHO - 06/01/20 Epic Cardiac Cath - 2018 Pacemaker/ICD device last checked: defibrillator 04/09/20 at [facility] Spinal Cord Stimulator: N/a  Sleep Study - positive sleep apnea CPAP - uses 3-4 days a week  Fasting Blood Sugar - 100-160 Checks Blood Sugar _1-2__ times a day  Blood Thinner Instructions: plavix, hold 5 days prior to surgery Aspirin Instructions: ASA 81mg , hold 5 days prior to surgery Last Dose: 8/21  Activity level: Can go up a flight of stairs and perform activities of daily living without stopping and without symptoms of chest pain or shortness of breath.     Anesthesia review: DM, HF, CAD, PAD, HTN, PAT A1C 8.7 and glucose 260.  Patient denies shortness of breath, fever, cough and chest pain at PAT appointment   Patient verbalized understanding of instructions that were given to them at the PAT appointment. Patient was also instructed that they will need to review over the PAT instructions again at home before surgery.

## 2020-10-18 NOTE — Patient Instructions (Addendum)
DUE TO COVID-19 ONLY ONE VISITOR IS ALLOWED TO COME WITH YOU AND STAY IN THE WAITING ROOM ONLY DURING PRE OP AND PROCEDURE.   **NO VISITORS ARE ALLOWED IN THE SHORT STAY AREA OR RECOVERY ROOM!!**       Your procedure is scheduled on: 11/05/20   Report to [facility] Main  Entrance    Report to admitting at 9:20 AM   Call this number if you have problems the morning of surgery [phone]   Do not eat food :After Midnight.   May have liquids until  8:20 AM day of surgery  CLEAR LIQUID DIET  Foods Allowed                                                                     Foods Excluded  Water, Black Coffee and tea, regular and decaf               liquids that you cannot  Plain Jell-O in any flavor  (No red)                                     see through such as: Fruit ices (not with fruit pulp)                                            milk, soups, orange juice              Iced Popsicles (No red)                                                All solid food                                   Apple juices Sports drinks like Gatorade (No red) Lightly seasoned clear broth or consume(fat free) Sugar, honey syrup    Oral Hygiene is also important to reduce your risk of infection.                                    Remember - BRUSH YOUR TEETH THE MORNING OF SURGERY WITH YOUR REGULAR TOOTHPASTE   Do NOT smoke after Midnight   Take these medicines the morning of surgery with A SIP OF WATER: Tylenol, Metoprolol Succinate.   DO NOT TAKE ANY ORAL DIABETIC MEDICATIONS DAY OF YOUR SURGERY  How to Manage Your Diabetes Before and After Surgery  Why is it important to control my blood sugar before and after surgery? Improving blood sugar levels before and after surgery helps healing and can limit problems. A way of improving blood sugar control is eating a healthy diet by:  Eating less sugar and carbohydrates  Increasing activity/exercise  Talking with your doctor about  reaching your blood sugar goals High  blood sugars (greater than 180 mg/dL) can raise your risk of infections and slow your recovery, so you will need to focus on controlling your diabetes during the weeks before surgery. Make sure that the doctor who takes care of your diabetes knows about your planned surgery including the date and location.  How do I manage my blood sugar before surgery? Check your blood sugar at least 4 times a day, starting 2 days before surgery, to make sure that the level is not too high or low. Check your blood sugar the morning of your surgery when you wake up and every 2 hours until you get to the Short Stay unit. If your blood sugar is less than 70 mg/dL, you will need to treat for low blood sugar: Do not take insulin. Treat a low blood sugar (less than 70 mg/dL) with  cup of clear juice (cranberry or apple), 4 glucose tablets, OR glucose gel. Recheck blood sugar in 15 minutes after treatment (to make sure it is greater than 70 mg/dL). If your blood sugar is not greater than 70 mg/dL on recheck, call [phone] for further instructions. Report your blood sugar to the short stay nurse when you get to Short Stay.  If you are admitted to the hospital after surgery: Your blood sugar will be checked by the staff and you will probably be given insulin after surgery (instead of oral diabetes medicines) to make sure you have good blood sugar levels. The goal for blood sugar control after surgery is 80-180 mg/dL.   WHAT DO I DO ABOUT MY DIABETES MEDICATION?  Do not take oral diabetes medicines (pills) the morning of surgery.  THE DAY BEFORE SURGERY, take Metformin and Sitagliptin as prescribed. Take morning dose of Glipizide, no evening dose. Do not take Jardiance    THE MORNING OF SURGERY, do not take any diabetic medication.    Reviewed and Endorsed by [facility] Patient Education Committee, August 2015                               You may not have any metal on  your body including hair pins, jewelry, and body piercing             Do not wear lotions, powders, cologne, or deodorant              Men may shave face and neck.   Do not bring valuables to the hospital. [facility] IS NOT             RESPONSIBLE   FOR VALUABLES.   Contacts, dentures or bridgework may not be worn into surgery.    Patients discharged the day of surgery will not be allowed to drive home.  Please read over the following fact sheets you were given: IF YOU HAVE QUESTIONS ABOUT YOUR PRE OP INSTRUCTIONS PLEASE CALL [phone]- [facility] - Preparing for Surgery Before surgery, you can play an important role.  Because skin is not sterile, your skin needs to be as free of germs as possible.  You can reduce the number of germs on your skin by washing with CHG (chlorahexidine gluconate) soap before surgery.  CHG is an antiseptic cleaner which kills germs and bonds with the skin to continue killing germs even after washing. Please DO NOT use if you have an allergy to CHG or antibacterial soaps.  If your skin becomes reddened/irritated stop using the CHG  and inform your nurse when you arrive at Short Stay. Do not shave (including legs and underarms) for at least 48 hours prior to the first CHG shower.  You may shave your face/neck.  Please follow these instructions carefully:  1.  Shower with CHG Soap the night before surgery and the  morning of surgery.  2.  If you choose to wash your hair, wash your hair first as usual with your normal  shampoo.  3.  After you shampoo, rinse your hair and body thoroughly to remove the shampoo.                             4.  Use CHG as you would any other liquid soap.  You can apply chg directly to the skin and wash.  Gently with a scrungie or clean washcloth.  5.  Apply the CHG Soap to your body ONLY FROM THE NECK DOWN.   Do   not use on face/ open                           Wound or open sores. Avoid contact with eyes, ears mouth and    genitals (private parts).                       Wash face,  Genitals (private parts) with your normal soap.             6.  Wash thoroughly, paying special attention to the area where your    surgery  will be performed.  7.  Thoroughly rinse your body with warm water from the neck down.  8.  DO NOT shower/wash with your normal soap after using and rinsing off the CHG Soap.                9.  Pat yourself dry with a clean towel.            10.  Wear clean pajamas.            11.  Place clean sheets on your bed the night of your first shower and do not  sleep with pets. Day of Surgery : Do not apply any lotions/deodorants the morning of surgery.  Please wear clean clothes to the hospital/surgery center.  FAILURE TO FOLLOW THESE INSTRUCTIONS MAY RESULT IN THE CANCELLATION OF YOUR SURGERY  PATIENT SIGNATURE_________________________________  NURSE SIGNATURE__________________________________  ________________________________________________________________________

## 2020-10-19 ENCOUNTER — Other Ambulatory Visit: Payer: Self-pay

## 2020-10-19 ENCOUNTER — Encounter (HOSPITAL_COMMUNITY)
Admission: RE | Admit: 2020-10-19 | Discharge: 2020-10-19 | Disposition: A | Discharge status: Home / Self Care | Payer: Medicaid Other | Source: Ambulatory Visit | Attending: Urology | Admitting: Urology

## 2020-10-19 ENCOUNTER — Encounter (HOSPITAL_COMMUNITY): Payer: Self-pay

## 2020-10-19 DIAGNOSIS — Z01812 Encounter for preprocedural laboratory examination: Secondary | ICD-10-CM | POA: Diagnosis present

## 2020-10-19 LAB — BASIC METABOLIC PANEL WITH GFR
Anion gap: 8 (ref 5–15)
BUN: 20 mg/dL (ref 8–23)
CO2: 24 mmol/L (ref 22–32)
Calcium: 9.7 mg/dL (ref 8.9–10.3)
Chloride: 106 mmol/L (ref 98–111)
Creatinine, Ser: 0.96 mg/dL (ref 0.61–1.24)
GFR, Estimated: >60 mL/min
Glucose, Bld: 260 mg/dL — ABNORMAL HIGH (ref 70–99)
Potassium: 4.5 mmol/L (ref 3.5–5.1)
Sodium: 138 mmol/L (ref 135–145)

## 2020-10-19 LAB — CBC
HCT: 42.3 % (ref 39.0–52.0)
Hemoglobin: 13.2 g/dL (ref 13.0–17.0)
MCH: 29.7 pg (ref 26.0–34.0)
MCHC: 31.2 g/dL (ref 30.0–36.0)
MCV: 95.1 fL (ref 80.0–100.0)
Platelets: 183 K/uL (ref 150–400)
RBC: 4.45 MIL/uL (ref 4.22–5.81)
RDW: 13.9 % (ref 11.5–15.5)
WBC: 8 K/uL (ref 4.0–10.5)
nRBC: 0 % (ref 0.0–0.2)

## 2020-10-19 LAB — GLUCOSE, CAPILLARY: Glucose-Capillary: 281 mg/dL — ABNORMAL HIGH (ref 70–99)

## 2020-10-19 LAB — HEMOGLOBIN A1C
Hgb A1c MFr Bld: 8.7 % — ABNORMAL HIGH (ref 4.8–5.6)
Mean Plasma Glucose: 202.99 mg/dL

## 2020-10-19 NOTE — Progress Notes (Signed)
PAT lab values: A1C 8.7, glucose 260. Results routed to Dr. Annabell Howells.

## 2020-10-19 NOTE — Progress Notes (Signed)
Subjective:  Patient ID: Jon Jones, male    DOB: 25-Nov-1955,  MRN: [id number]  Chief Complaint  Patient presents with   Routine Post Op    POV DOS 7.22.22    DOS: 10/01/2020 Procedure: Right second digit amputation  65 y.o. male returns for post-op check.  Patient is doing well.  Bandages are clean dry and intact.  He denies any other acute complaints.  He has not done any dressing changes.  He has completed course of antibiotics.  Review of Systems: Negative except as noted in the HPI. Denies N/V/F/Ch.  Past Medical History:  Diagnosis Date   5 years ago    AICD (automatic cardioverter/defibrillator) present    Anxiety    Arthritis    Cataract    CHF (congestive heart failure) ([name])    Coronary artery disease    Diabetes mellitus without complication ([name])    Diabetic retinopathy ([place])    Dysrhythmia    GERD (gastroesophageal reflux disease)    PMH   Hypertension    Hypertensive retinopathy    Peripheral vascular disease ([name])    Peripheral vascular disease ([name])    Pneumonia    Shoulder impingement, right    Sleep apnea    wears cpap   Stomach ulcer    Wears glasses     Current Outpatient Medications:    Accu-Chek Softclix Lancets lancets, CHECK BLOOD SUGAR ONCE DAILY, Disp: 100 each, Rfl: 2   acetaminophen (TYLENOL) 325 MG tablet, Take 2 tablets (650 mg total) by mouth every 6 (six) hours as needed for mild pain (or Fever >/= 101)., Disp: , Rfl:    aspirin 81 MG EC tablet, Take 81 mg by mouth daily., Disp: , Rfl:    atorvastatin (LIPITOR) 80 MG tablet, Take 1 tablet (80 mg total) by mouth every evening., Disp: 90 tablet, Rfl: 3   Blood Glucose Monitoring Suppl (ACCU-CHEK GUIDE ME) w/Device KIT, Check blood sugar once daily., Disp: 1 kit, Rfl: 0   ciclopirox (PENLAC) 8 % solution, Apply topically at bedtime. Apply over nail and surrounding skin. Apply daily over previous coat. After seven (7) days, may remove with alcohol and continue cycle., Disp: 6.6 mL, Rfl: 3    clobetasol ointment (TEMOVATE) 0.86 %, Apply 1 application topically 2 (two) times daily as needed., Disp: 30 g, Rfl: 0   clopidogrel (PLAVIX) 75 MG tablet, Take 75 mg by mouth daily., Disp: , Rfl:    empagliflozin (JARDIANCE) 25 MG TABS tablet, Take 1 tablet (25 mg total) by mouth at bedtime., Disp: 90 tablet, Rfl: 2   gabapentin (NEURONTIN) 300 MG capsule, TAKE 1 CAPSULE(300 MG) BY MOUTH AT BEDTIME, Disp: 30 capsule, Rfl: 5   glipiZIDE (GLUCOTROL) 10 MG tablet, Take 2 tablets (20 mg total) by mouth 2 (two) times daily., Disp: 360 tablet, Rfl: 2   glucose blood (ACCU-CHEK AVIVA PLUS) test strip, Test BS 3x/day with meals, Disp: 100 each, Rfl: 3   metFORMIN (GLUCOPHAGE) 1000 MG tablet, Take 1 tablet (1,000 mg total) by mouth 2 (two) times daily with a meal., Disp: 180 tablet, Rfl: 2   metoprolol succinate (TOPROL-XL) 100 MG 24 hr tablet, Take 1 tablet (100 mg total) by mouth daily., Disp: 90 tablet, Rfl: 3   Misc. Devices MISC, Cpap supplies-tubing mask Dx G47.33 and Z99.89, Disp: 1 Device, Rfl: 0   nitroGLYCERIN (NITROSTAT) 0.4 MG SL tablet, Place 0.4 mg under the tongue See admin instructions. Every 5 minutes as needed for chest pain. Max of 3 doses,  Disp: , Rfl:    nystatin cream (MYCOSTATIN), Apply 1 application topically 3 (three) times daily as needed for dry skin., Disp: , Rfl:    PROAIR HFA 108 (90 Base) MCG/ACT inhaler, INHALE 2 PUFFS INTO THE LUNGS EVERY 6 HOURS AS NEEDED FOR WHEEZING OR SHORTNESS OF BREATH, Disp: 8.5 g, Rfl: 1   RESTASIS 0.05 % ophthalmic emulsion, Place 1 drop into both eyes in the morning and at bedtime., Disp: , Rfl:    sacubitril-valsartan (ENTRESTO) 24-26 MG, Take 1 tablet by mouth 2 (two) times daily., Disp: 60 tablet, Rfl: 11   sitaGLIPtin (JANUVIA) 100 MG tablet, Take 1 tablet (100 mg total) by mouth daily., Disp: 90 tablet, Rfl: 2   spironolactone (ALDACTONE) 25 MG tablet, Take 0.5 tablets (12.5 mg total) by mouth daily., Disp: 45 tablet, Rfl: 3   triamcinolone  (KENALOG) 0.1 %, Apply 1 application topically 2 (two) times daily., Disp: , Rfl:    triamcinolone ointment (KENALOG) 0.1 %, Apply 1 application topically in the morning and at bedtime., Disp: , Rfl:   Social History   Tobacco Use  Smoking Status Former   Packs/day: 1.00   Years: 40.00   Pack years: 40.00   Types: Cigarettes   Quit date: 03/11/2019   Years since quitting: 1.6  Smokeless Tobacco Never    No Known Allergies Objective:  There were no vitals filed for this visit. There is no height or weight on file to calculate BMI. Constitutional Well developed. Well nourished.  Vascular Foot warm and well perfused. Capillary refill normal to all digits.   Neurologic Normal speech. Oriented to person, place, and time. Epicritic sensation to light touch grossly present bilaterally.  Dermatologic Skin completely epithelialized.  No signs of infection noted.  No pressure sore or any other signs of ulceration noted.  The amputation site has healed considerably  Orthopedic: No further tenderness to palpation noted about the surgical site.   Radiographs: None Assessment:   1. History of amputation of lesser toe of right foot ([place])     Plan:  Patient was evaluated and treated and all questions answered.  S/p foot surgery right -Clinically healed and the skin has completely epithelialized.  I discussed shoe gear modification and continued prevention of ulcers and foot infection.  I discussed with him the importance of controlling his glucose.  I will continue to follow him for his routine care and diabetic foot exam every few months.  Patient states understanding. Return in about 3 months (around 01/15/2021).

## 2020-10-21 NOTE — Progress Notes (Signed)
Anesthesia Chart Review   Case: [id number] Date/Time: 11/05/20 1105   Procedure: CIRCUMCISION ADULT   Anesthesia type: General   Pre-op diagnosis: PHIMOSIS   Location: [name] / [name]   Surgeons: Irine Seal, MD       DISCUSSION:65 y.o. former smoker with h/o GERD, sleep apnea, HTN, DM II, PVD, CHF, AICD in place, CAD, phimosis scheduled for above procedure 11/05/2020 with Dr. Irine Seal.   CBG 281 at PAT visit 10/19/2020, non-fasting.  Reports he checks his blood sugar 1-2 times per day, usually around 100-160.  Last A1C 8.7 which is improved from 11.0 07/23/20, 9.8 09/08/20.   Per cardiology preoperative evaluation 10/13/2020, "Chart reviewed as part of pre-operative protocol coverage. Patient was contacted 10/13/2020 in reference to pre-operative risk assessment for pending surgery as outlined below.  Jon Jones was last seen on 08/30/2020 by Dr. Gardiner Rhyme.  Since that day, Jon Jones has done well without any chest pain or worsening dyspnea.  He may hold Plavix for 5 days prior to the procedure and restart as soon as possible.  If needed, he may also hold aspirin for 5 days as well prior to the surgery and restart as soon as possible afterward at the surgeon's discretion.   Therefore, based on ACC/AHA guidelines, the patient would be at acceptable risk for the planned procedure without further cardiovascular testing."  Device orders pending.  VS: BP 129/72   Pulse 83   Temp 36.7 C (Oral)   Resp 18   Ht 5' 6"  (1.676 m)   Wt 106.3 kg   SpO2 98%   BMI 37.83 kg/m   PROVIDERS: Ladell Pier, MD is PCP   Donato Heinz, MD is Cardiologist LABS: Labs reviewed: Acceptable for surgery. (all labs ordered are listed, but only abnormal results are displayed)  Labs Reviewed  HEMOGLOBIN A1C - Abnormal; Notable for the following components:      Result Value   Hgb A1c MFr Bld 8.7 (*)    All other components within normal limits  BASIC METABOLIC PANEL - Abnormal; Notable  for the following components:   Glucose, Bld 260 (*)    All other components within normal limits  GLUCOSE, CAPILLARY - Abnormal; Notable for the following components:   Glucose-Capillary 281 (*)    All other components within normal limits  CBC     IMAGES:   EKG: 05/21/20 Rate 93 bpm  Sinus rhythm with occasional premature ventricular complexes  CV: Echo 06/01/20 1. Left ventricular ejection fraction, by estimation, is 35 to 40%. The  left ventricle has moderately decreased function. The left ventricle  demonstrates regional wall motion abnormalities (see scoring  diagram/findings for description). There is mild  left ventricular hypertrophy. Left ventricular diastolic parameters were  normal. There is severe akinesis of the left ventricular, basal-mid  inferior wall.   2. Right ventricular systolic function is normal. The right ventricular  size is normal.   3. The mitral valve is normal in structure. No evidence of mitral valve  regurgitation. No evidence of mitral stenosis.   4. The aortic valve is normal in structure. Aortic valve regurgitation is  not visualized.  Past Medical History:  Diagnosis Date   5 years ago    AICD (automatic cardioverter/defibrillator) present    Anxiety    Arthritis    Cataract    CHF (congestive heart failure) ([name])    Coronary artery disease    Diabetes mellitus without complication ([name])    Diabetic retinopathy ([name])  Dysrhythmia    GERD (gastroesophageal reflux disease)    PMH   Hypertension    Hypertensive retinopathy    Peripheral vascular disease ([name])    Peripheral vascular disease ([name])    Pneumonia    Shoulder impingement, right    Sleep apnea    wears cpap   Stomach ulcer    Wears glasses     Past Surgical History:  Procedure Laterality Date   AMPUTATION Left 03/12/2019   Procedure: 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;  Surgeon: [name], MD;  Location: [facility];  Service: Orthopedics;  Laterality: Left;   AMPUTATION  Right 10/01/2020   Procedure: RIGHT 2nd TOE AMPUTATION;  Surgeon: Felipa Furnace, DPM;  Location: [facility];  Service: Podiatry;  Laterality: Right;   CARDIAC CATHETERIZATION     CARDIAC DEFIBRILLATOR PLACEMENT     I & D EXTREMITY Left 07/19/2016   Procedure: IRRIGATION AND DEBRIDEMENT EXTREMITY/OLECRANON(WASHOUT);  Surgeon: Leandrew Koyanagi, MD;  Location: [facility];  Service: Orthopedics;  Laterality: Left;   OLECRANON BURSECTOMY Left 07/19/2016   Procedure: LEFT ELBOW OLECRANON BURSECTOMY;  Surgeon: Leandrew Koyanagi, MD;  Location: [facility];  Service: Orthopedics;  Laterality: Left;   SHOULDER ARTHROSCOPY Right 05/16/2019   Procedure: Right Shoulder Arthroscopy;  Surgeon: [name], MD;  Location: [facility];  Service: Orthopedics;  Laterality: Right;    MEDICATIONS:  Accu-Chek Softclix Lancets lancets   acetaminophen (TYLENOL) 325 MG tablet   aspirin 81 MG EC tablet   atorvastatin (LIPITOR) 80 MG tablet   Blood Glucose Monitoring Suppl (ACCU-CHEK GUIDE ME) w/Device KIT   ciclopirox (PENLAC) 8 % solution   clobetasol ointment (TEMOVATE) 0.05 %   clopidogrel (PLAVIX) 75 MG tablet   empagliflozin (JARDIANCE) 25 MG TABS tablet   gabapentin (NEURONTIN) 300 MG capsule   glipiZIDE (GLUCOTROL) 10 MG tablet   glucose blood (ACCU-CHEK AVIVA PLUS) test strip   metFORMIN (GLUCOPHAGE) 1000 MG tablet   metoprolol succinate (TOPROL-XL) 100 MG 24 hr tablet   Misc. Devices MISC   nitroGLYCERIN (NITROSTAT) 0.4 MG SL tablet   nystatin cream (MYCOSTATIN)   PROAIR HFA 108 (90 Base) MCG/ACT inhaler   RESTASIS 0.05 % ophthalmic emulsion   sacubitril-valsartan (ENTRESTO) 24-26 MG   sitaGLIPtin (JANUVIA) 100 MG tablet   spironolactone (ALDACTONE) 25 MG tablet   triamcinolone (KENALOG) 0.1 %   triamcinolone ointment (KENALOG) 0.1 %   No current facility-administered medications for this encounter.    Jon Felix, PA-C [facility] Pre-Surgical Testing [phone]

## 2020-10-21 NOTE — Anesthesia Preprocedure Evaluation (Addendum)
Anesthesia Evaluation  Patient identified by MRN, date of birth, ID band Patient awake    Reviewed: Allergy & Precautions, NPO status , Patient's Chart, lab work & pertinent test results  History of Anesthesia Complications Negative for: history of anesthetic complications  Airway Mallampati: III  TM Distance: >3 FB Neck ROM: Full    Dental  (+) Edentulous Upper, Edentulous Lower, Dental Advisory Given   Pulmonary sleep apnea , former smoker,    Pulmonary exam normal        Cardiovascular hypertension, + CAD, + Past MI, + Peripheral Vascular Disease and +CHF  Normal cardiovascular exam+ Cardiac Defibrillator      Neuro/Psych negative neurological ROS     GI/Hepatic Neg liver ROS, PUD, GERD  ,  Endo/Other  diabetes, Type 2  Renal/GU Renal disease  negative genitourinary   Musculoskeletal  (+) Arthritis ,   Abdominal   Peds  Hematology negative hematology ROS (+)   Anesthesia Other Findings   Reproductive/Obstetrics                           Anesthesia Physical Anesthesia Plan  ASA: 4  Anesthesia Plan: General   Post-op Pain Management:    Induction: Intravenous  PONV Risk Score and Plan: 2 and Ondansetron, Dexamethasone, Midazolam and Treatment may vary due to age or medical condition  Airway Management Planned: LMA  Additional Equipment: None  Intra-op Plan:   Post-operative Plan: Extubation in OR  Informed Consent: I have reviewed the patients History and Physical, chart, labs and discussed the procedure including the risks, benefits and alternatives for the proposed anesthesia with the patient or authorized representative who has indicated his/her understanding and acceptance.     Dental advisory given  Plan Discussed with:   Anesthesia Plan Comments: (See PAT note 10/19/2020, Jodell Cipro, PA-C)       Anesthesia Quick Evaluation

## 2020-10-27 ENCOUNTER — Ambulatory Visit: Payer: Self-pay

## 2020-10-27 ENCOUNTER — Encounter: Payer: Self-pay | Admitting: Specialist

## 2020-10-27 ENCOUNTER — Ambulatory Visit (INDEPENDENT_AMBULATORY_CARE_PROVIDER_SITE_OTHER): Payer: Medicaid Other | Admitting: Specialist

## 2020-10-27 ENCOUNTER — Other Ambulatory Visit: Payer: Self-pay

## 2020-10-27 VITALS — BP 112/75 | HR 75 | Ht 66.0 in | Wt 234.0 lb

## 2020-10-27 DIAGNOSIS — M5416 Radiculopathy, lumbar region: Secondary | ICD-10-CM

## 2020-10-27 DIAGNOSIS — M4316 Spondylolisthesis, lumbar region: Secondary | ICD-10-CM

## 2020-10-27 DIAGNOSIS — M47812 Spondylosis without myelopathy or radiculopathy, cervical region: Secondary | ICD-10-CM

## 2020-10-27 DIAGNOSIS — M48062 Spinal stenosis, lumbar region with neurogenic claudication: Secondary | ICD-10-CM | POA: Diagnosis not present

## 2020-10-27 NOTE — Patient Instructions (Signed)
Avoid bending, stooping and avoid lifting weights greater than 10 lbs. Avoid prolong standing and walking. Order for a new walker with wheels. Surgery scheduling secretary Tivis Ringer, will call you in the next week to schedule for surgery.  Surgery recommended is a two level lumbar decompressive laminectomy L5-S1 and L4-5 with one level fusion L5-S1 the fusion would be done with rods, screws and cages with local bone graft and allograft (donor bone graft).  Risk of surgery includes risk of infection 1 in 100-200 patients, bleeding less than 1/2% chance you would need a transfusion.   Risk to the nerves is one in 10,000. You will need to use a brace for 3 months and wean from the brace on the 4th month. Expect improved walking and standing tolerance. Expect relief of leg pain but numbness may persist depending on the length and degree of pressure that has been present.

## 2020-10-27 NOTE — Progress Notes (Signed)
Office Visit Note   [name]           Date of Birth: June 21, 1955           MRN: [id number] Visit Date: 10/27/2020              Requested by: Adonis HugueninZamora, Erin R, NP [address] [place],  [place] [id number] PCP: Marcine MatarJohnson, Deborah B, MD   Assessment & Plan: Visit Diagnoses:  1. Lumbar back pain with radiculopathy affecting left lower extremity   2. Spondylolisthesis, lumbar region   3. Spinal stenosis of lumbar region with neurogenic claudication   4. Spondylosis without myelopathy or radiculopathy, cervical region     Plan: Avoid bending, stooping and avoid lifting weights greater than 10 lbs. Avoid prolong standing and walking. Order for a new walker with wheels. Surgery scheduling secretary Tivis RingerSherri Billings, will call you in the next week to schedule for surgery.  Surgery recommended is a two level lumbar decompressive laminectomy L5-S1 and L4-5 with one level fusion L5-S1 the fusion would be done with rods, screws and cages with local bone graft and allograft (donor bone graft).  Risk of surgery includes risk of infection 1 in 100-200 patients, bleeding less than 1/2% chance you would need a transfusion.   Risk to the nerves is one in 10,000. You will need to use a brace for 3 months and wean from the brace on the 4th month. Expect improved walking and standing tolerance. Expect relief of leg pain but numbness may persist depending on the length and degree of pressure that has been present.  Follow-Up Instructions: No follow-ups on file.   Orders:  Orders Placed This Encounter  Procedures  . XR Lumbar Spine 2-3 Views   No orders of the defined types were placed in this encounter.     Procedures: No procedures performed   Clinical Data: Findings:  Narrative & Impression CLINICAL DATA:  Claudication with walking.  EXAM: LUMBAR MYELOGRAM  FLUOROSCOPY TIME:  2 minutes 4 seconds. 417.04 micro gray meter squared  PROCEDURE: After thorough discussion of  risks and benefits of the procedure including bleeding, infection, injury to nerves, blood vessels, adjacent structures as well as headache and CSF leak, written and oral informed consent was obtained. Consent was obtained by Dr. Paulina FusiMark Shogry. Time out form was completed.  Patient was positioned prone on the fluoroscopy table. Local anesthesia was provided with 1% lidocaine without epinephrine after prepped and draped in the usual sterile fashion. Puncture was performed at L2-3 using a 5 inch 22-gauge spinal needle via left paramedian approach. Using a single pass through the dura, the needle was placed within the thecal sac, with return of clear CSF. 12 cc of Isovue M-200 was injected into the thecal sac, with normal opacification of the nerve roots and cauda equina consistent with free flow within the subarachnoid space.  I personally performed the lumbar puncture and administered the intrathecal contrast. I also personally performed acquisition of the myelogram images.  TECHNIQUE: Contiguous axial images were obtained through the Lumbar spine after the intrathecal infusion of infusion. Coronal and sagittal reconstructions were obtained of the axial image sets.  COMPARISON:  Radiography 07/14/2019  FINDINGS: LUMBAR MYELOGRAM FINDINGS:  Mild canal narrowing at the L4-5 level. At L5-S1, there is 5 mm of anterolisthesis which increases to 8 mm with standing and flexion. There is multifactorial spinal stenosis at this level. Some vacuum phenomenon noted within the disc space. Pronounced bilateral facet arthropathy.  CT LUMBAR MYELOGRAM FINDINGS:  T12-L1: Solid bridging anterior osteophytes. Annular bulging and calcification. Facet and ligamentous hypertrophy. Mild canal stenosis but without distal cord compression. Right foraminal encroachment by osteophyte could possibly affect the right T12 nerve.  L1-2: Minimal disc bulge.  No stenosis or neural  compression.  L2-3: Normal interspace.  L3-4: Mild bulging of the disc towards the left. Mild facet and ligamentous hypertrophy. Mild narrowing of the left lateral recess but without likely neural compression.  L4-5: Disc degeneration with vacuum phenomenon. Circumferential protrusion of the disc. Facet and ligamentous hypertrophy. Moderate multifactorial spinal stenosis which could possibly be symptomatic.  L5-S1: Advanced bilateral facet arthropathy with 5 mm of anterolisthesis. This is known to increase to 8 mm with standing and flexion. Complex synovial cyst arising from the facet joint on the left within the posterior left side of the spinal canal. Disc degeneration with vacuum phenomenon and bulging. Marked constriction of the thecal sac due to these processes in combination with abundant epidural fat. Severe bilateral foraminal stenosis could compress either or both exiting L5 nerves.  Bilateral sacroiliac osteoarthritis is noted.  The patient does have atherosclerotic calcification of the aorta and iliac arteries but without apparent severe calcific stenosis.  IMPRESSION: 1. L5-S1: Severe multifactorial spinal stenosis. Advanced bilateral facet arthropathy with 5 mm of anterolisthesis which increases to 8 mm with standing and flexion. Bulging of the disc. Complex synovial cyst arising from the facet joint on the left within the posterior left side of the spinal canal. Marked constriction of the thecal sac due to these processes in combination with abundant epidural fat. Severe bilateral foraminal stenosis could compress either or both exiting L5 nerves. 2. L4-5: Moderate multifactorial spinal stenosis. 3. T12-L1: Right foraminal encroachment by osteophyte could possibly affect the right T12 nerve.  Aortic Atherosclerosis (ICD10-I70.0).   Electronically Signed   By: Paulina Fusi M.D.   On: 06/29/2020 12:39     Subjective: Chief Complaint  Patient  presents with  . Lower Back - Pain  . Left Leg - Pain    65 year old male with history of Back pain with radiation into the legs, into the calves with standing and walking. Pain improves with sitting bending and with leaning on grocery carts and leaning on furniture around the house. No bowel or bladder difficulty. Jon Jones complained of pain in the calves and was seen by his cardiologist And Jon Jones had noninvasive artery studies which where normal. Jon Jones is not able to walk a mile and has difficulty even walking into the store from the parking lot without leaning on a cart. There is numbness and tingling into the legs. 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    Review of Systems   Objective: Vital Signs: BP 112/75 (BP Location: Left Arm, Patient Position: Sitting)   Pulse 75   Ht 5\' 6"  (1.676 m)   Wt 234 lb (106.1 kg)   BMI 37.77 kg/m   Physical Exam Constitutional:      Appearance: Jon Jones is well-developed.  HENT:     Head: Normocephalic and atraumatic.  Eyes:     Pupils: Pupils are equal, round, and reactive to light.  Pulmonary:     Effort: Pulmonary effort is normal.     Breath sounds: Normal breath sounds.  Abdominal:     General: Bowel sounds are normal.     Palpations: Abdomen is soft.  Musculoskeletal:     Cervical back: Normal range of motion and neck supple.  Lumbar back: Negative right straight leg raise test and negative left straight leg raise test.  Skin:    General: Skin is warm and dry.  Neurological:     Mental Status: Jon Jones is alert and oriented to person, place, and time.  Psychiatric:        Behavior: Behavior normal.        Thought Content: Thought content normal.        Judgment: Judgment normal.    Back Exam   Tenderness  The patient is experiencing tenderness in the lumbar.  Range of Motion  Extension:  abnormal  Flexion:  normal  Lateral bend right:  abnormal  Lateral bend left:  abnormal  Rotation right:  abnormal   Rotation left:  abnormal   Muscle Strength  Right Quadriceps:  5/5  Left Quadriceps:  5/5  Right Hamstrings:  5/5  Left Hamstrings:  5/5   Tests  Straight leg raise right: negative Straight leg raise left: negative  Reflexes  Patellar:  1/4 Achilles:  1/4 Babinski's sign: normal   Other  Toe walk: normal Heel walk: normal Sensation: normal     Specialty Comments:  No specialty comments available.  Imaging: No results found.   PMFS History: Patient Active Problem List   Diagnosis Date Noted  . Phimosis of penis 10/06/2020  . Cellulitis 09/29/2020  . Cellulitis of right toe 09/29/2020  . Chronic combined systolic and diastolic heart failure ([name]) 09/15/2020  . Onychomycosis of toenail 08/24/2020  . Diabetes mellitus ([name]) 07/23/2020  . Type 2 diabetes mellitus with hyperglycemia, without long-term current use of insulin ([name]) 07/23/2020  . Weight gain 07/23/2020  . Type 2 diabetes mellitus with peripheral neuropathy ([name]) 07/23/2020  . 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 diabetes mellitus ([name]) 05/04/2020  . Hypertensive heart disease with heart failure ([name]) 04/26/2020  . Type 2 diabetes mellitus with diabetic peripheral angiopathy without gangrene, without long-term current use of insulin ([name]) 04/26/2020  . OSA on CPAP 04/26/2020  . PAD (peripheral artery disease) ([name]) 04/26/2020  . CAD in native artery 04/26/2020  . Class 2 severe obesity due to excess calories with serious comorbidity and body mass index (BMI) of 38.0 to 38.9 in adult ([name]) 04/26/2020  . Impingement syndrome of right shoulder   . Nontraumatic complete tear of right rotator cuff   . Type 2 diabetes mellitus with foot ulcer, without long-term current use of insulin ([name])   . 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 S/P debridement 07/19/2016  . Acute gout of left elbow 07/19/2016   Past Medical History:  Diagnosis Date  . 5 years ago   . AICD (automatic  cardioverter/defibrillator) present   . Anxiety   . Arthritis   . Cataract   . CHF (congestive heart failure) ([name])   . Coronary artery disease   . Diabetes mellitus without complication ([name])   . Diabetic retinopathy ([name])   . Dysrhythmia   . GERD (gastroesophageal reflux disease)    PMH  . Hypertension   . Hypertensive retinopathy   . Peripheral vascular disease ([name])   . Peripheral vascular disease ([name])   . Pneumonia   . Shoulder impingement, right   . Sleep apnea    wears cpap  . Stomach ulcer   . Wears glasses     Family History  Problem Relation Age of Onset  . Heart disease Mother   . Diabetes Mother   . Peripheral vascular disease Mother  Past Surgical History:  Procedure Laterality Date  . AMPUTATION Left 03/12/2019   Procedure: 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;  Surgeon: [name], MD;  Location: [facility] OR;  Service: Orthopedics;  Laterality: Left;  . AMPUTATION Right 10/01/2020   Procedure: RIGHT 2nd TOE AMPUTATION;  Surgeon: [name], DPM;  Location: [facility] OR;  Service: Podiatry;  Laterality: Right;  . CARDIAC CATHETERIZATION    . CARDIAC DEFIBRILLATOR PLACEMENT    . I & D EXTREMITY Left 07/19/2016   Procedure: IRRIGATION AND DEBRIDEMENT EXTREMITY/OLECRANON(WASHOUT);  Surgeon: Tarry Kos, MD;  Location: [facility] OR;  Service: Orthopedics;  Laterality: Left;  . OLECRANON BURSECTOMY Left 07/19/2016   Procedure: LEFT ELBOW OLECRANON BURSECTOMY;  Surgeon: Tarry Kos, MD;  Location: [facility];  Service: Orthopedics;  Laterality: Left;  . SHOULDER ARTHROSCOPY Right 05/16/2019   Procedure: Right Shoulder Arthroscopy;  Surgeon: [name], MD;  Location: [facility] OR;  Service: Orthopedics;  Laterality: Right;   Social History   Occupational History  . Not on file  Tobacco Use  . Smoking status: Former    Packs/day: 1.00    Years: 40.00    Pack years: 40.00    Types: Cigarettes    Quit date: 03/11/2019    Years since quitting: 1.6  . Smokeless tobacco:  Never  Vaping Use  . Vaping Use: Never used  Substance and Sexual Activity  . Alcohol use: Not Currently  . Drug use: Not Currently    Types: Oxycodone  . Sexual activity: Not on file

## 2020-10-28 ENCOUNTER — Encounter: Payer: Self-pay | Admitting: Internal Medicine

## 2020-10-28 ENCOUNTER — Telehealth: Payer: Self-pay | Admitting: Specialist

## 2020-10-28 ENCOUNTER — Ambulatory Visit: Payer: Medicaid Other | Admitting: Internal Medicine

## 2020-10-28 VITALS — BP 116/78 | HR 91 | Ht 66.0 in | Wt 239.0 lb

## 2020-10-28 DIAGNOSIS — E1165 Type 2 diabetes mellitus with hyperglycemia: Secondary | ICD-10-CM

## 2020-10-28 DIAGNOSIS — E1142 Type 2 diabetes mellitus with diabetic polyneuropathy: Secondary | ICD-10-CM | POA: Diagnosis not present

## 2020-10-28 MED ORDER — JANUMET 50-1000 MG PO TABS: 1.0000 | ORAL_TABLET | Freq: Two times a day (BID) | ORAL | 3 refills | Status: DC

## 2020-10-28 NOTE — Telephone Encounter (Signed)
Patient is here. Says he did not receive the name of socks he is suppose to have. [phone] also he did not get his medication called in.

## 2020-10-28 NOTE — Progress Notes (Signed)
Name: Jon Jones  Age/ Sex: 65 y.o., male   MRN/ DOB: [id number], Feb 18, 1956     PCP: Ladell Pier, MD   Reason for Endocrinology Evaluation: Type 2 Diabetes Mellitus  Initial Endocrine Consultative Visit: 07/23/2020    PATIENT IDENTIFIER: Jon Jones is a 65 y.o. male with a past medical history of T2DM, HTN, PAD, CAD, OSA on CPAP. The patient has followed with Endocrinology clinic since 07/23/2020 for consultative assistance with management of his diabetes.  DIABETIC HISTORY:  Jon Jones was diagnosed with DM yrs ago, has been on oral glycemic agents and no insulin. His hemoglobin A1c has ranged from 7.3%  in 2019, peaking at 10.1% in 04/2020.    On his initial visit to our clinic his A1c was 11.0 % , he was on Jardiance, Glipizide, Metformin and Januvia, he DECLINED insulin during that visit and opted with oral glycemic agents.  We increased Glipizide and Januvia and Continue Jardiance and Metformin  SUBJECTIVE:   During the last visit (07/23/2020): A1c 11.0 % INcreased Januvia and Glipizide and continued Jardiance and Metformin   Today (10/28/2020): Jon Jones is here for a follow up on diabetes management.  He checks his blood sugars 1 times daily. The patient has not had hypoglycemic episodes since the last clinic visit.     In 09/2020 he presented with a diabetic food ulcer  on the right 2nd toe requiring ABx and S/P right 2nd toe amputation  Scheduled for circumcision on 8/26th , due to excess skin NOT due to infection    Pending back sx for spinal stenosis , has leg tightness  He is also pending right shoulder sx    HOME DIABETES REGIMEN:  Jardiance 25 mg, 1 tablet before breakfast  Glipizide 10 mg to TWO tablets before breakfast and TWO tablets before supper Metformin 1000 mg, 1 tablet before breakfast and 1 tablet before supper  Januvia 100 mg, 1 tablet before breakfast        Statin: yes ACE-I/ARB: yes    METER DOWNLOAD SUMMARY:  Am 142-150 Mid  160-180  Night 120 - 782   DIABETIC COMPLICATIONS: Microvascular complications:  Microalbuminuria, neuropathy  Denies: CKD , retinopathy  Last eye exam: Completed  3 yrs ago    Macrovascular complications:  PVD, CAD Denies: CVA   HISTORY:  Past Medical History:  Past Medical History:  Diagnosis Date   5 years ago    AICD (automatic cardioverter/defibrillator) present    Anxiety    Arthritis    Cataract    CHF (congestive heart failure) ([place])    Coronary artery disease    Diabetes mellitus without complication ([place])    Diabetic retinopathy ([place])    Dysrhythmia    GERD (gastroesophageal reflux disease)    PMH   Hypertension    Hypertensive retinopathy    Peripheral vascular disease ([place])    Peripheral vascular disease ([place])    Pneumonia    Shoulder impingement, right    Sleep apnea    wears cpap   Stomach ulcer    Wears glasses    Past Surgical History:  Past Surgical History:  Procedure Laterality Date   AMPUTATION Left 03/12/2019   Procedure: 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;  Surgeon: [name], MD;  Location: [place];  Service: Orthopedics;  Laterality: Left;   AMPUTATION Right 10/01/2020   Procedure: RIGHT 2nd TOE AMPUTATION;  Surgeon: Felipa Furnace, DPM;  Location: [place];  Service: Podiatry;  Laterality: Right;   CARDIAC  CATHETERIZATION     CARDIAC DEFIBRILLATOR PLACEMENT     I & D EXTREMITY Left 07/19/2016   Procedure: IRRIGATION AND DEBRIDEMENT EXTREMITY/OLECRANON(WASHOUT);  Surgeon: Leandrew Koyanagi, MD;  Location: [place];  Service: Orthopedics;  Laterality: Left;   OLECRANON BURSECTOMY Left 07/19/2016   Procedure: LEFT ELBOW OLECRANON BURSECTOMY;  Surgeon: Leandrew Koyanagi, MD;  Location: [place];  Service: Orthopedics;  Laterality: Left;   SHOULDER ARTHROSCOPY Right 05/16/2019   Procedure: Right Shoulder Arthroscopy;  Surgeon: [name], MD;  Location: [place];  Service: Orthopedics;  Laterality: Right;   Social History:  reports that he quit  smoking about 19 months ago. His smoking use included cigarettes. He has a 40.00 pack-year smoking history. He has never used smokeless tobacco. He reports that he does not currently use alcohol. He reports that he does not currently use drugs after having used the following drugs: Oxycodone. Family History:  Family History  Problem Relation Age of Onset   Heart disease Mother    Diabetes Mother    Peripheral vascular disease Mother      HOME MEDICATIONS: Allergies as of 10/28/2020   No Known Allergies      Medication List        Accurate as of October 28, 2020  8:45 PM. If you have any questions, ask your nurse or doctor.          STOP taking these medications    metFORMIN 1000 MG tablet Commonly known as: GLUCOPHAGE Stopped by: Dorita Sciara, MD   sitaGLIPtin 100 MG tablet Commonly known as: Januvia Stopped by: Dorita Sciara, MD       TAKE these medications    Accu-Chek Aviva Plus test strip Generic drug: glucose blood Test BS 3x/day with meals   Accu-Chek Guide Me w/Device Kit Check blood sugar once daily.   Accu-Chek Softclix Lancets lancets CHECK BLOOD SUGAR ONCE DAILY   acetaminophen 325 MG tablet Commonly known as: TYLENOL Take 2 tablets (650 mg total) by mouth every 6 (six) hours as needed for mild pain (or Fever >/= 101).   aspirin 81 MG EC tablet Take 81 mg by mouth daily.   atorvastatin 80 MG tablet Commonly known as: LIPITOR Take 1 tablet (80 mg total) by mouth every evening.   ciclopirox 8 % solution Commonly known as: Penlac Apply topically at bedtime. Apply over nail and surrounding skin. Apply daily over previous coat. After seven (7) days, may remove with alcohol and continue cycle.   clobetasol ointment 0.05 % Commonly known as: TEMOVATE Apply 1 application topically 2 (two) times daily as needed.   clopidogrel 75 MG tablet Commonly known as: PLAVIX Take 75 mg by mouth daily.   empagliflozin 25 MG Tabs  tablet Commonly known as: JARDIANCE Take 1 tablet (25 mg total) by mouth at bedtime.   Entresto 24-26 MG Generic drug: sacubitril-valsartan Take 1 tablet by mouth 2 (two) times daily.   gabapentin 300 MG capsule Commonly known as: NEURONTIN TAKE 1 CAPSULE(300 MG) BY MOUTH AT BEDTIME   glipiZIDE 10 MG tablet Commonly known as: GLUCOTROL Take 2 tablets (20 mg total) by mouth 2 (two) times daily.   Janumet 50-1000 MG tablet Generic drug: sitaGLIPtin-metformin Take 1 tablet by mouth 2 (two) times daily with a meal. Started by: Dorita Sciara, MD   metoprolol succinate 100 MG 24 hr tablet Commonly known as: TOPROL-XL Take 1 tablet (100 mg total) by mouth daily.   Misc. Devices Misc Cpap  supplies-tubing mask Dx G47.33 and Z99.89   nitroGLYCERIN 0.4 MG SL tablet Commonly known as: NITROSTAT Place 0.4 mg under the tongue See admin instructions. Every 5 minutes as needed for chest pain. Max of 3 doses   nystatin cream Commonly known as: MYCOSTATIN Apply 1 application topically 3 (three) times daily as needed for dry skin.   ProAir HFA 108 (90 Base) MCG/ACT inhaler Generic drug: albuterol INHALE 2 PUFFS INTO THE LUNGS EVERY 6 HOURS AS NEEDED FOR WHEEZING OR SHORTNESS OF BREATH   Restasis 0.05 % ophthalmic emulsion Generic drug: cycloSPORINE Place 1 drop into both eyes in the morning and at bedtime.   spironolactone 25 MG tablet Commonly known as: ALDACTONE Take 0.5 tablets (12.5 mg total) by mouth daily.   triamcinolone cream 0.1 % Commonly known as: KENALOG Apply 1 application topically 2 (two) times daily.   triamcinolone ointment 0.1 % Commonly known as: KENALOG Apply 1 application topically in the morning and at bedtime.         OBJECTIVE:   Vital Signs: BP 116/78   Pulse 91   Ht 5' 6"  (1.676 m)   Wt 239 lb (108.4 kg)   SpO2 96%   BMI 38.58 kg/m   Wt Readings from Last 3 Encounters:  10/28/20 239 lb (108.4 kg)  10/27/20 234 lb (106.1 kg)   10/19/20 234 lb 6.4 oz (106.3 kg)     Exam: General: Pt appears well and is in NAD  Lungs: Clear with good BS bilat with no rales, rhonchi, or wheezes  Heart: RRR with normal S1 and S2 and no gallops; no murmurs; no rub  Abdomen: Normoactive bowel sounds, soft, nontender, without masses or organomegaly palpable  Extremities: No pretibial edema.   Neuro: MS is good with appropriate affect, pt is alert and Ox3    DM foot exam: 10/28/2020  The skin of the feet is without sores or ulcerations, left 5th toe amputation and right 2nd toe amputation  The pedal pulses are 2+ on right and 2+ on left. The sensation is decreased  to a screening 5.07, 10 gram monofilament at the right great toe    DATA REVIEWED:  Lab Results  Component Value Date   HGBA1C 8.7 (H) 10/19/2020   HGBA1C 9.8 (H) 09/08/2020   HGBA1C 11.0 (A) 07/23/2020   Lab Results  Component Value Date   LDLCALC 25 04/26/2020   CREATININE 0.96 10/19/2020   Lab Results  Component Value Date   MICRALBCREAT 100 (H) 04/26/2020     Lab Results  Component Value Date   CHOL 93 (L) 04/26/2020   HDL 25 (L) 04/26/2020   LDLCALC 25 04/26/2020   TRIG 289 (H) 04/26/2020   CHOLHDL 3.7 04/26/2020         ASSESSMENT / PLAN / RECOMMENDATIONS:   1) Type 2 Diabetes Mellitus, Poorly controlled, With neuropathic, microalbuminuria, and macrovascular  complications - Most recent A1c of 8.7 %. Goal A1c < 7.0 %.    - A1c trending down gradually  - He declines insulin  - Would like to switch to [place], believes this has worked well for him in the past     MEDICATIONS: Continue Jardiance 25 mg, 1 tablet before breakfast  - Continue Glipizide 10 mg to TWO tablets before breakfast and TWO tablets before supper - Start Janumet 50-1000 mg 1 tablet twice a day  - STOP Plain Metformin  - STOP Plain Janumet   EDUCATION / INSTRUCTIONS: BG monitoring instructions: Patient is instructed to check  his blood sugars 1 times a day, fasting  . Call [facility] if: BG persistently < 70 or > 300. I reviewed the Rule of 15 for the treatment of hypoglycemia in detail with the patient. Literature supplied.     2) Diabetic complications:  Eye: Does not have known diabetic retinopathy.  Neuro/ Feet: Does  have known diabetic peripheral neuropathy .  Renal: Patient does not have known baseline CKD. He   is  on an ACEI/ARB at present.    3) Dyslipidemia/CAD/PVD: Patient is on a statin.    LDL at goal . Follows with cardiology   F/U in 4 months     Signed electronically by: Mack Guise, MD  [facility]  [facility] [address] [place], [place] [address] Phone: [phone] FAX: [phone]   CC: Ladell Pier, MD [place] [address] Phone: [phone]  Fax: [phone]  Return to Endocrinology clinic as below: Future Appointments  Date Time Provider [place]  11/02/2020  9:30 AM Bernarda Caffey, MD [facility] None  12/14/2020  9:00 AM Troy Sine, MD [facility]  01/19/2021  9:20 AM Donato Heinz, MD [facility]  01/21/2021 10:30 AM Gardiner Barefoot, DPM [facility]

## 2020-10-28 NOTE — Patient Instructions (Addendum)
-   Keep Up the Good work ! - Continue Jardiance 25 mg, 1 tablet before breakfast  - Continue Glipizide 10 mg to TWO tablets before breakfast and TWO tablets before supper - Start Janumet 50-1000 mg 1 tablet twice a day  - STOP Plain Metformin  - STOP Plain Janumet     HOW TO TREAT LOW BLOOD SUGARS (Blood sugar LESS THAN 70 MG/DL) Please follow the RULE OF 15 for the treatment of hypoglycemia treatment (when your (blood sugars are less than 70 mg/dL)   STEP 1: Take 15 grams of carbohydrates when your blood sugar is low, which includes:  3-4 GLUCOSE TABS  OR 3-4 OZ OF JUICE OR REGULAR SODA OR ONE TUBE OF GLUCOSE GEL    STEP 2: RECHECK blood sugar in 15 MINUTES STEP 3: If your blood sugar is still low at the 15 minute recheck --> then, go back to STEP 1 and treat AGAIN with another 15 grams of carbohydrates.

## 2020-10-29 NOTE — Progress Notes (Signed)
[facility] Note  11/02/2020     CHIEF COMPLAINT Patient presents for Retina Follow Up   HISTORY OF PRESENT ILLNESS: Jon Jones is a 65 y.o. male who presents to the clinic today for:   HPI     Retina Follow Up   Patient presents with  Diabetic Retinopathy.  In both eyes.  This started weeks ago.  Severity is moderate.  Duration of weeks.  Since onset it is stable.  I, the attending physician,  performed the HPI with the patient and updated documentation appropriately.        Comments   Pt states vision is the same OU.  Pt denies eye pain or discomfort.  Pt denies any new or worsening floaters or fol OU.      Last edited by Bernarda Caffey, MD on 11/07/2020 12:32 AM.     Pt states he has better control over his diabetes now, his A1c is 8.7 on August 9   Referring physician: Shirleen Schirmer, PA-C [facility] [address] [place],  [place] [address]  HISTORICAL INFORMATION:   Selected notes from the MEDICAL RECORD NUMBER Referred by Shirleen Schirmer, PA for eval of diabetic retinopathy OU; DME OS   CURRENT MEDICATIONS: Current Outpatient Medications (Ophthalmic Drugs)  Medication Sig   RESTASIS 0.05 % ophthalmic emulsion Place 1 drop into both eyes in the morning and at bedtime.   No current facility-administered medications for this visit. (Ophthalmic Drugs)   Current Outpatient Medications (Other)  Medication Sig   Accu-Chek Softclix Lancets lancets CHECK BLOOD SUGAR ONCE DAILY   acetaminophen (TYLENOL) 325 MG tablet Take 2 tablets (650 mg total) by mouth every 6 (six) hours as needed for mild pain (or Fever >/= 101).   aspirin 81 MG EC tablet Take 81 mg by mouth daily.   atorvastatin (LIPITOR) 80 MG tablet Take 1 tablet (80 mg total) by mouth every evening.   Blood Glucose Monitoring Suppl (ACCU-CHEK GUIDE ME) w/Device KIT Check blood sugar once daily.   ciclopirox (PENLAC) 8 % solution Apply topically at  bedtime. Apply over nail and surrounding skin. Apply daily over previous coat. After seven (7) days, may remove with alcohol and continue cycle.   clobetasol ointment (TEMOVATE) 6.30 % Apply 1 application topically 2 (two) times daily as needed.   clopidogrel (PLAVIX) 75 MG tablet Take 75 mg by mouth daily.   empagliflozin (JARDIANCE) 25 MG TABS tablet Take 1 tablet (25 mg total) by mouth at bedtime.   gabapentin (NEURONTIN) 300 MG capsule TAKE 1 CAPSULE(300 MG) BY MOUTH AT BEDTIME   glipiZIDE (GLUCOTROL) 10 MG tablet Take 2 tablets (20 mg total) by mouth 2 (two) times daily.   glucose blood (ACCU-CHEK AVIVA PLUS) test strip Test BS 3x/day with meals   metoprolol succinate (TOPROL-XL) 100 MG 24 hr tablet Take 1 tablet (100 mg total) by mouth daily.   Misc. Devices MISC Cpap supplies-tubing mask Dx G47.33 and Z99.89   nitroGLYCERIN (NITROSTAT) 0.4 MG SL tablet Place 0.4 mg under the tongue See admin instructions. Every 5 minutes as needed for chest pain. Max of 3 doses   nystatin cream (MYCOSTATIN) Apply 1 application topically 3 (three) times daily as needed for dry skin.   PROAIR HFA 108 (90 Base) MCG/ACT inhaler INHALE 2 PUFFS INTO THE LUNGS EVERY 6 HOURS AS NEEDED FOR WHEEZING OR SHORTNESS OF BREATH   sacubitril-valsartan (ENTRESTO) 24-26 MG Take 1 tablet by mouth 2 (two)  times daily.   sitaGLIPtin-metformin (JANUMET) 50-1000 MG tablet Take 1 tablet by mouth 2 (two) times daily with a meal.   spironolactone (ALDACTONE) 25 MG tablet Take 0.5 tablets (12.5 mg total) by mouth daily.   triamcinolone (KENALOG) 0.1 % Apply 1 application topically 2 (two) times daily.   triamcinolone ointment (KENALOG) 0.1 % Apply 1 application topically in the morning and at bedtime.   No current facility-administered medications for this visit. (Other)      REVIEW OF SYSTEMS: ROS   Positive for: Endocrine, Cardiovascular, Eyes, Respiratory Negative for: Constitutional, Gastrointestinal, Neurological, Skin,  Genitourinary, Musculoskeletal, HENT, Psychiatric, Allergic/Imm, Heme/Lymph Last edited by Doneen Poisson on 11/02/2020  9:46 AM.        ALLERGIES No Known Allergies  PAST MEDICAL HISTORY Past Medical History:  Diagnosis Date   5 years ago    AICD (automatic cardioverter/defibrillator) present    Anxiety    Arthritis    Cataract    CHF (congestive heart failure) ([name])    Coronary artery disease    Diabetes mellitus without complication ([name])    Diabetic retinopathy ([place])    Dysrhythmia    GERD (gastroesophageal reflux disease)    PMH   Hypertension    Hypertensive retinopathy    Peripheral vascular disease ([place])    Peripheral vascular disease ([place])    Pneumonia    Shoulder impingement, right    Sleep apnea    wears cpap   Stomach ulcer    Wears glasses    Past Surgical History:  Procedure Laterality Date   AMPUTATION Left 03/12/2019   Procedure: 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;  Surgeon: [name], MD;  Location: [facility];  Service: Orthopedics;  Laterality: Left;   AMPUTATION Right 10/01/2020   Procedure: RIGHT 2nd TOE AMPUTATION;  Surgeon: Felipa Furnace, DPM;  Location: [facility];  Service: Podiatry;  Laterality: Right;   CARDIAC CATHETERIZATION     CARDIAC DEFIBRILLATOR PLACEMENT     CIRCUMCISION N/A 11/05/2020   Procedure: CIRCUMCISION ADULT;  Surgeon: Irine Seal, MD;  Location: [facility];  Service: Urology;  Laterality: N/A;   I & D EXTREMITY Left 07/19/2016   Procedure: IRRIGATION AND DEBRIDEMENT EXTREMITY/OLECRANON(WASHOUT);  Surgeon: Leandrew Koyanagi, MD;  Location: [facility];  Service: Orthopedics;  Laterality: Left;   OLECRANON BURSECTOMY Left 07/19/2016   Procedure: LEFT ELBOW OLECRANON BURSECTOMY;  Surgeon: Leandrew Koyanagi, MD;  Location: [facility];  Service: Orthopedics;  Laterality: Left;   SHOULDER ARTHROSCOPY Right 05/16/2019   Procedure: Right Shoulder Arthroscopy;  Surgeon: [name], MD;  Location: [facility];  Service: Orthopedics;  Laterality: Right;     FAMILY HISTORY Family History  Problem Relation Age of Onset   Heart disease Mother    Diabetes Mother    Peripheral vascular disease Mother     SOCIAL HISTORY Social History   Tobacco Use   Smoking status: Former    Packs/day: 1.00    Years: 40.00    Pack years: 40.00    Types: Cigarettes    Quit date: 03/11/2019    Years since quitting: 1.6   Smokeless tobacco: Never  Vaping Use   Vaping Use: Never used  Substance Use Topics   Alcohol use: Not Currently   Drug use: Not Currently    Types: Oxycodone         OPHTHALMIC EXAM:  Base Eye Exam     Visual Acuity (Snellen - Linear)       Right Left  Dist cc 20/20 20/20         Tonometry (Tonopen, 9:49 AM)       Right Left   Pressure 16 18         Pupils       Dark Light Shape React APD   Right 3 2 Round Brisk 0   Left 3 2 Round Brisk 0         Visual Fields       Left Right    Full Full         Extraocular Movement       Right Left    Full Full         Neuro/Psych     Oriented x3: Yes   Mood/Affect: Normal         Dilation     Both eyes: 1.0% Mydriacyl, 2.5% Phenylephrine @ 9:49 AM           Slit Lamp and Fundus Exam     Slit Lamp Exam       Right Left   Lids/Lashes Dermatochalasis - upper lid Dermatochalasis - upper lid   Conjunctiva/Sclera nasal pingeucula nasal pingeucula   Cornea arcus, tear film debris, 2+ PEE arcus, tear film debris, 2+PEE, old subepithelial scars inferior paracentral with haze   Anterior Chamber Deep and quiet Deep and quiet   Iris Round and dilated, No NVI Round and dilated, No NVI   Lens 2-3+ Nuclear sclerosis, 2-3+ Cortical cataract 2-3+ Nuclear sclerosis, 2-3+ Cortical cataract   Vitreous Vitreous syneresis Vitreous syneresis         Fundus Exam       Right Left   Disc Pink and Sharp, Compact Pink and Sharp, Compact   C/D Ratio 0.2 0.2   Macula Flat, Blunted foveal reflex, +MA Flat, Blunted foveal reflex, +MA/DBH, trace  cystic changes nasal fovea   Vessels attenuated, mild tortuousity attenuated, mild tortuousity   Periphery Attached; rare MA Attached; rare MA           Refraction     Wearing Rx       Sphere Cylinder Axis Add   Right +1.25 +1.00 137 +2.50   Left +1.00 +0.50 030 +2.50    Type: PAL            IMAGING AND PROCEDURES  Imaging and Procedures for 11/02/2020  OCT, Retina - OU - Both Eyes       Right Eye Quality was good. Central Foveal Thickness: 243. Progression has been stable. Findings include normal foveal contour, no IRF, no SRF (Focal cystic changes ST mac).   Left Eye Quality was good. Central Foveal Thickness: 249. Progression has been stable. Findings include normal foveal contour, intraretinal hyper-reflective material, intraretinal fluid, no SRF (Focal cystic changes / IRHM nasal fovea).   Notes *Images captured and stored on drive  Diagnosis / Impression:  OD: Focal cystic changes ST mac OS: Focal cystic changes / IRHM nasal fovea  Clinical management:  See below  Abbreviations: NFP - Normal foveal profile. CME - cystoid macular edema. PED - pigment epithelial detachment. IRF - intraretinal fluid. SRF - subretinal fluid. EZ - ellipsoid zone. ERM - epiretinal membrane. ORA - outer retinal atrophy. ORT - outer retinal tubulation. SRHM - subretinal hyper-reflective material. IRHM - intraretinal hyper-reflective material            ASSESSMENT/PLAN:    ICD-10-CM   1. Moderate nonproliferative diabetic retinopathy of both eyes with macular edema associated with type 2  diabetes mellitus ([place])  Q76.1950     2. Retinal edema  H35.81 OCT, Retina - OU - Both Eyes    3. Essential hypertension  I10     4. Hypertensive retinopathy of both eyes  H35.033     5. Combined forms of age-related cataract of both eyes  H25.813       1,2. Moderate nonproliferative diabetic retinopathy OU  OD: no DME  OS: mild cystic changes / IRHM nasal macula  - last A1c in  Epic is 8.7 on 08.09.22 - pt working with Endodrinologist and blood glucose improving  - exam shows scattered MA OU - FA (06.21.22) shows late leaking MA, no NV OU - OCT without diabetic macular edema OD; OS w/ focal cystic changes and IRHM nasal fovea  - no retinal intervention recommended at this time -- advised continued optimization of DM2 and HTN - f/u in 3-4 mos, sooner -- DFE/OCT  3,4. Hypertensive retinopathy OU - discussed importance of tight BP control - monitor  5. Mixed Cataract OU - The symptoms of cataract, surgical options, and treatments and risks were discussed with patient. - discussed diagnosis and progression - monitor for now   Ophthalmic Meds Ordered this visit:  No orders of the defined types were placed in this encounter.      Return for 3-4 mos - NPDR OU, DFE, OCT.  There are no Patient Instructions on file for this visit.   Explained the diagnoses, plan, and follow up with the patient and they expressed understanding.  Patient expressed understanding of the importance of proper follow up care.   This document serves as a record of services personally performed by Gardiner Sleeper, MD, PhD. It was created on their behalf by San Jetty. Owens Shark, OA an ophthalmic technician. The creation of this record is the provider's dictation and/or activities during the visit.    Electronically signed by: San Jetty. Marguerita Merles 08.19.2022 12:38 AM   Gardiner Sleeper, M.D., Ph.D. Diseases & Surgery of the Retina and Vitreous [facility]  I have reviewed the above documentation for accuracy and completeness, and I agree with the above. Gardiner Sleeper, M.D., Ph.D. 11/07/20 12:38 AM   Abbreviations: M myopia (nearsighted); A astigmatism; H hyperopia (farsighted); P presbyopia; Mrx spectacle prescription;  CTL contact lenses; OD right eye; OS left eye; OU both eyes  XT exotropia; ET esotropia; PEK punctate epithelial keratitis; PEE punctate epithelial  erosions; DES dry eye syndrome; MGD meibomian gland dysfunction; ATs artificial tears; PFAT's preservative free artificial tears; [place] nuclear sclerotic cataract; PSC posterior subcapsular cataract; ERM epi-retinal membrane; PVD posterior vitreous detachment; RD retinal detachment; DM diabetes mellitus; DR diabetic retinopathy; NPDR non-proliferative diabetic retinopathy; PDR proliferative diabetic retinopathy; CSME clinically significant macular edema; DME diabetic macular edema; dbh dot blot hemorrhages; CWS cotton wool spot; POAG primary open angle glaucoma; C/D cup-to-disc ratio; HVF humphrey visual field; GVF goldmann visual field; OCT optical coherence tomography; IOP intraocular pressure; BRVO Branch retinal vein occlusion; CRVO central retinal vein occlusion; CRAO central retinal artery occlusion; BRAO branch retinal artery occlusion; RT retinal tear; SB scleral buckle; PPV pars plana vitrectomy; VH Vitreous hemorrhage; PRP panretinal laser photocoagulation; IVK intravitreal kenalog; VMT vitreomacular traction; MH Macular hole;  NVD neovascularization of the disc; NVE neovascularization elsewhere; AREDS age related eye disease study; ARMD age related macular degeneration; POAG primary open angle glaucoma; EBMD epithelial/anterior basement membrane dystrophy; ACIOL anterior chamber intraocular lens; IOL intraocular lens; PCIOL posterior chamber intraocular lens; Phaco/IOL  phacoemulsification with intraocular lens placement; [place] photorefractive keratectomy; LASIK laser assisted in situ keratomileusis; HTN hypertension; DM diabetes mellitus; COPD chronic obstructive pulmonary disease

## 2020-11-02 ENCOUNTER — Ambulatory Visit (INDEPENDENT_AMBULATORY_CARE_PROVIDER_SITE_OTHER): Payer: Medicaid Other | Admitting: Ophthalmology

## 2020-11-02 ENCOUNTER — Other Ambulatory Visit: Payer: Self-pay

## 2020-11-02 ENCOUNTER — Ambulatory Visit: Payer: Medicaid Other | Admitting: Cardiovascular Disease

## 2020-11-02 DIAGNOSIS — H35033 Hypertensive retinopathy, bilateral: Secondary | ICD-10-CM | POA: Diagnosis not present

## 2020-11-02 DIAGNOSIS — I10 Essential (primary) hypertension: Secondary | ICD-10-CM | POA: Diagnosis not present

## 2020-11-02 DIAGNOSIS — H25813 Combined forms of age-related cataract, bilateral: Secondary | ICD-10-CM

## 2020-11-02 DIAGNOSIS — E113313 Type 2 diabetes mellitus with moderate nonproliferative diabetic retinopathy with macular edema, bilateral: Secondary | ICD-10-CM | POA: Diagnosis not present

## 2020-11-02 DIAGNOSIS — H3581 Retinal edema: Secondary | ICD-10-CM

## 2020-11-05 ENCOUNTER — Ambulatory Visit (HOSPITAL_COMMUNITY): Payer: Medicaid Other | Admitting: Physician Assistant

## 2020-11-05 ENCOUNTER — Encounter (HOSPITAL_COMMUNITY): Payer: Self-pay | Admitting: Urology

## 2020-11-05 ENCOUNTER — Other Ambulatory Visit: Payer: Self-pay

## 2020-11-05 ENCOUNTER — Ambulatory Visit (HOSPITAL_COMMUNITY): Payer: Medicaid Other | Admitting: Certified Registered"

## 2020-11-05 ENCOUNTER — Encounter (HOSPITAL_COMMUNITY)
Admission: RE | Disposition: A | Discharge status: Home / Self Care | Payer: Self-pay | Source: Ambulatory Visit | Attending: Urology

## 2020-11-05 ENCOUNTER — Ambulatory Visit (HOSPITAL_COMMUNITY)
Admission: RE | Admit: 2020-11-05 | Discharge: 2020-11-05 | Disposition: A | Discharge status: Home / Self Care | Payer: Medicaid Other | Source: Ambulatory Visit | Attending: Urology | Admitting: Urology

## 2020-11-05 DIAGNOSIS — Z8249 Family history of ischemic heart disease and other diseases of the circulatory system: Secondary | ICD-10-CM | POA: Insufficient documentation

## 2020-11-05 DIAGNOSIS — Z87891 Personal history of nicotine dependence: Secondary | ICD-10-CM | POA: Insufficient documentation

## 2020-11-05 DIAGNOSIS — I110 Hypertensive heart disease with heart failure: Secondary | ICD-10-CM | POA: Diagnosis not present

## 2020-11-05 DIAGNOSIS — I252 Old myocardial infarction: Secondary | ICD-10-CM | POA: Diagnosis not present

## 2020-11-05 DIAGNOSIS — Z86718 Personal history of other venous thrombosis and embolism: Secondary | ICD-10-CM | POA: Diagnosis not present

## 2020-11-05 DIAGNOSIS — I739 Peripheral vascular disease, unspecified: Secondary | ICD-10-CM | POA: Insufficient documentation

## 2020-11-05 DIAGNOSIS — Z833 Family history of diabetes mellitus: Secondary | ICD-10-CM | POA: Insufficient documentation

## 2020-11-05 DIAGNOSIS — G4730 Sleep apnea, unspecified: Secondary | ICD-10-CM | POA: Diagnosis not present

## 2020-11-05 DIAGNOSIS — I4891 Unspecified atrial fibrillation: Secondary | ICD-10-CM | POA: Diagnosis not present

## 2020-11-05 DIAGNOSIS — I5089 Other heart failure: Secondary | ICD-10-CM | POA: Diagnosis not present

## 2020-11-05 DIAGNOSIS — N481 Balanitis: Secondary | ICD-10-CM | POA: Diagnosis not present

## 2020-11-05 DIAGNOSIS — E119 Type 2 diabetes mellitus without complications: Secondary | ICD-10-CM | POA: Diagnosis not present

## 2020-11-05 DIAGNOSIS — N471 Phimosis: Secondary | ICD-10-CM | POA: Insufficient documentation

## 2020-11-05 DIAGNOSIS — N476 Balanoposthitis: Secondary | ICD-10-CM | POA: Diagnosis not present

## 2020-11-05 DIAGNOSIS — K219 Gastro-esophageal reflux disease without esophagitis: Secondary | ICD-10-CM | POA: Diagnosis not present

## 2020-11-05 DIAGNOSIS — I5042 Chronic combined systolic (congestive) and diastolic (congestive) heart failure: Secondary | ICD-10-CM | POA: Diagnosis not present

## 2020-11-05 HISTORY — PX: CIRCUMCISION: SHX1350

## 2020-11-05 LAB — GLUCOSE, CAPILLARY
Glucose-Capillary: 179 mg/dL — ABNORMAL HIGH (ref 70–99)
Glucose-Capillary: 160 mg/dL — ABNORMAL HIGH (ref 70–99)

## 2020-11-05 SURGERY — CIRCUMCISION, ADULT
Anesthesia: General

## 2020-11-05 MED ORDER — LIDOCAINE 2% (20 MG/ML) 5 ML SYRINGE
INTRAMUSCULAR | Status: DC | PRN
  Administered 2020-11-05: 40 mg via INTRAVENOUS

## 2020-11-05 MED ORDER — ORAL CARE MOUTH RINSE: 15.0000 mL | Freq: Once | OROMUCOSAL | Status: AC

## 2020-11-05 MED ORDER — FENTANYL CITRATE (PF) 100 MCG/2ML IJ SOLN
INTRAMUSCULAR | Status: AC
  Filled 2020-11-05: qty 2

## 2020-11-05 MED ORDER — LIDOCAINE 2% (20 MG/ML) 5 ML SYRINGE
INTRAMUSCULAR | Status: AC
  Filled 2020-11-05: qty 5

## 2020-11-05 MED ORDER — LIDOCAINE HCL 1 % IJ SOLN
INTRAMUSCULAR | Status: DC | PRN
  Administered 2020-11-05: 5 mL

## 2020-11-05 MED ORDER — ONDANSETRON HCL 4 MG/2ML IJ SOLN
INTRAMUSCULAR | Status: AC
  Filled 2020-11-05: qty 2

## 2020-11-05 MED ORDER — BUPIVACAINE HCL 0.25 % IJ SOLN
INTRAMUSCULAR | Status: AC
  Filled 2020-11-05: qty 1

## 2020-11-05 MED ORDER — OXYCODONE HCL 5 MG PO TABS
5.0000 mg | ORAL_TABLET | Freq: Once | ORAL | Status: DC | PRN
Start: 2020-11-05 — End: 2020-11-05

## 2020-11-05 MED ORDER — FENTANYL CITRATE (PF) 100 MCG/2ML IJ SOLN
INTRAMUSCULAR | Status: DC | PRN
  Administered 2020-11-05: 50 ug via INTRAVENOUS

## 2020-11-05 MED ORDER — PROPOFOL 10 MG/ML IV BOLUS
INTRAVENOUS | Status: DC | PRN
  Administered 2020-11-05: 100 mg via INTRAVENOUS

## 2020-11-05 MED ORDER — PHENYLEPHRINE 40 MCG/ML (10ML) SYRINGE FOR IV PUSH (FOR BLOOD PRESSURE SUPPORT)
PREFILLED_SYRINGE | INTRAVENOUS | Status: DC | PRN
  Administered 2020-11-05 (×4): 60 ug via INTRAVENOUS

## 2020-11-05 MED ORDER — MIDAZOLAM HCL 2 MG/2ML IJ SOLN
INTRAMUSCULAR | Status: DC | PRN
  Administered 2020-11-05: 2 mg via INTRAVENOUS

## 2020-11-05 MED ORDER — OXYCODONE HCL 5 MG/5ML PO SOLN
5.0000 mg | Freq: Once | ORAL | Status: DC | PRN
Start: 2020-11-05 — End: 2020-11-05

## 2020-11-05 MED ORDER — ONDANSETRON HCL 4 MG/2ML IJ SOLN: 4.0000 mg | Freq: Once | INTRAMUSCULAR | Status: DC | PRN

## 2020-11-05 MED ORDER — ONDANSETRON HCL 4 MG/2ML IJ SOLN
INTRAMUSCULAR | Status: DC | PRN
  Administered 2020-11-05: 4 mg via INTRAVENOUS

## 2020-11-05 MED ORDER — LIDOCAINE HCL (PF) 1 % IJ SOLN
INTRAMUSCULAR | Status: AC
  Filled 2020-11-05: qty 30

## 2020-11-05 MED ORDER — PROPOFOL 10 MG/ML IV BOLUS
INTRAVENOUS | Status: AC
  Filled 2020-11-05: qty 40

## 2020-11-05 MED ORDER — DEXAMETHASONE SODIUM PHOSPHATE 10 MG/ML IJ SOLN
INTRAMUSCULAR | Status: DC | PRN
Start: 2020-11-05 — End: 2020-11-05
  Administered 2020-11-05: 4 mg via INTRAVENOUS

## 2020-11-05 MED ORDER — EPHEDRINE 5 MG/ML INJ
INTRAVENOUS | Status: AC
  Filled 2020-11-05: qty 10

## 2020-11-05 MED ORDER — BACITRACIN-NEOMYCIN-POLYMYXIN OINTMENT TUBE
TOPICAL_OINTMENT | CUTANEOUS | Status: AC
  Filled 2020-11-05: qty 14.17

## 2020-11-05 MED ORDER — LACTATED RINGERS IV SOLN: INTRAVENOUS | Status: DC

## 2020-11-05 MED ORDER — 0.9 % SODIUM CHLORIDE (POUR BTL) OPTIME
TOPICAL | Status: DC | PRN
  Administered 2020-11-05: 1000 mL

## 2020-11-05 MED ORDER — CHLORHEXIDINE GLUCONATE 0.12 % MT SOLN
15.0000 mL | Freq: Once | OROMUCOSAL | Status: AC
  Administered 2020-11-05: 15 mL via OROMUCOSAL

## 2020-11-05 MED ORDER — SODIUM CHLORIDE 0.9% FLUSH: 3.0000 mL | Freq: Two times a day (BID) | INTRAVENOUS | Status: DC

## 2020-11-05 MED ORDER — BUPIVACAINE HCL 0.25 % IJ SOLN
INTRAMUSCULAR | Status: DC | PRN
  Administered 2020-11-05: 5 mL

## 2020-11-05 MED ORDER — MIDAZOLAM HCL 2 MG/2ML IJ SOLN
INTRAMUSCULAR | Status: AC
  Filled 2020-11-05: qty 2

## 2020-11-05 MED ORDER — DEXAMETHASONE SODIUM PHOSPHATE 10 MG/ML IJ SOLN
INTRAMUSCULAR | Status: AC
  Filled 2020-11-05: qty 1

## 2020-11-05 MED ORDER — AMISULPRIDE (ANTIEMETIC) 5 MG/2ML IV SOLN: 10.0000 mg | Freq: Once | INTRAVENOUS | Status: DC | PRN

## 2020-11-05 MED ORDER — CEFAZOLIN SODIUM-DEXTROSE 2-4 GM/100ML-% IV SOLN
2.0000 g | INTRAVENOUS | Status: AC
  Administered 2020-11-05: 2 g via INTRAVENOUS
  Filled 2020-11-05: qty 100

## 2020-11-05 MED ORDER — FENTANYL CITRATE PF 50 MCG/ML IJ SOSY: 25.0000 ug | PREFILLED_SYRINGE | INTRAMUSCULAR | Status: DC | PRN

## 2020-11-05 MED ORDER — EPHEDRINE SULFATE-NACL 50-0.9 MG/10ML-% IV SOSY
PREFILLED_SYRINGE | INTRAVENOUS | Status: DC | PRN
  Administered 2020-11-05 (×2): 5 mg via INTRAVENOUS

## 2020-11-05 SURGICAL SUPPLY — 26 items
BAG COUNTER SPONGE SURGICOUNT (BAG) IMPLANT
BLADE SURG 15 STRL LF DISP TIS (BLADE) ×1 IMPLANT
BLADE SURG 15 STRL SS (BLADE) ×1
BNDG COHESIVE 1X5 TAN STRL LF (GAUZE/BANDAGES/DRESSINGS) ×2 IMPLANT
BNDG COHESIVE 2X5 TAN ST LF (GAUZE/BANDAGES/DRESSINGS) ×2 IMPLANT
BNDG CONFORM 2 STRL LF (GAUZE/BANDAGES/DRESSINGS) ×2 IMPLANT
COVER SURGICAL LIGHT HANDLE (MISCELLANEOUS) ×2 IMPLANT
DECANTER SPIKE VIAL GLASS SM (MISCELLANEOUS) IMPLANT
DRAPE LAPAROTOMY T 98X78 PEDS (DRAPES) ×2 IMPLANT
ELECT NEEDLE TIP 2.8 STRL (NEEDLE) ×2 IMPLANT
ELECT PENCIL ROCKER SW 15FT (MISCELLANEOUS) ×2 IMPLANT
ELECT REM PT RETURN 15FT ADLT (MISCELLANEOUS) ×2 IMPLANT
GAUZE 4X4 16PLY ~~LOC~~+RFID DBL (SPONGE) ×2 IMPLANT
GAUZE SPONGE 4X4 12PLY STRL (GAUZE/BANDAGES/DRESSINGS) ×2 IMPLANT
GAUZE XEROFORM 1X8 LF (GAUZE/BANDAGES/DRESSINGS) ×2 IMPLANT
GLOVE SURG POLYISO LF SZ8 (GLOVE) IMPLANT
GOWN STRL REUS W/TWL XL LVL3 (GOWN DISPOSABLE) ×2 IMPLANT
KIT BASIN OR (CUSTOM PROCEDURE TRAY) ×2 IMPLANT
KIT TURNOVER KIT A (KITS) ×2 IMPLANT
NEEDLE HYPO 25X1 1.5 SAFETY (NEEDLE) IMPLANT
NS IRRIG 1000ML POUR BTL (IV SOLUTION) IMPLANT
PACK BASIC VI WITH GOWN DISP (CUSTOM PROCEDURE TRAY) ×2 IMPLANT
SUT CHROMIC 4 0 PS 2 18 (SUTURE) ×4 IMPLANT
SYR CONTROL 10ML LL (SYRINGE) IMPLANT
TOWEL OR 17X26 10 PK STRL BLUE (TOWEL DISPOSABLE) ×2 IMPLANT
WATER STERILE IRR 1000ML POUR (IV SOLUTION) IMPLANT

## 2020-11-05 NOTE — Interval H&P Note (Signed)
History and Physical Interval Note:  11/05/2020 11:11 AM  Jon Jones  has presented today for surgery, with the diagnosis of PHIMOSIS.  The various methods of treatment have been discussed with the patient and family. After consideration of risks, benefits and other options for treatment, the patient has consented to  Procedure(s): CIRCUMCISION ADULT (N/A) as a surgical intervention.  The patient's history has been reviewed, patient examined, no change in status, stable for surgery.  I have reviewed the patient's chart and labs.  Questions were answered to the patient's satisfaction.     [name]

## 2020-11-05 NOTE — Anesthesia Postprocedure Evaluation (Signed)
Anesthesia Post Note  [name]  Procedure(s) Performed: CIRCUMCISION ADULT     Patient location during evaluation: PACU Anesthesia Type: General Level of consciousness: awake and alert Pain management: pain level controlled Vital Signs Assessment: post-procedure vital signs reviewed and stable Respiratory status: spontaneous breathing, nonlabored ventilation and respiratory function stable Cardiovascular status: blood pressure returned to baseline and stable Postop Assessment: no apparent nausea or vomiting Anesthetic complications: no   No notable events documented.  Last Vitals:  Vitals:   11/05/20 1230 11/05/20 1309  BP: 105/68 (!) 143/78  Pulse: 71 76  Resp: 12 12  Temp: 36.7 C   SpO2: 94% 99%    Last Pain:  Vitals:   11/05/20 1309  TempSrc:   PainSc: 0-No pain                 Lucretia Kern

## 2020-11-05 NOTE — Anesthesia Procedure Notes (Addendum)
Procedure Name: LMA Insertion Date/Time: 11/05/2020 11:28 AM Performed by: Sindy Guadeloupe, CRNA Pre-anesthesia Checklist: Patient identified, Emergency Drugs available, Suction available, Patient being monitored and Timeout performed Patient Re-evaluated:Patient Re-evaluated prior to induction Oxygen Delivery Method: Circle system utilized Preoxygenation: Pre-oxygenation with 100% oxygen Induction Type: IV induction LMA: LMA with gastric port inserted LMA Size: 4.0 Placement Confirmation: positive ETCO2 and breath sounds checked- equal and bilateral Tube secured with: Tape Dental Injury: Teeth and Oropharynx as per pre-operative assessment  Comments: Performed by Rodney Booze

## 2020-11-05 NOTE — Transfer of Care (Signed)
Immediate Anesthesia Transfer of Care Note  [name]  Procedure(s) Performed: CIRCUMCISION ADULT  Patient Location: PACU  Anesthesia Type:General  Level of Consciousness: awake, alert  and patient cooperative  Airway & Oxygen Therapy: Patient Spontanous Breathing and Patient connected to face mask oxygen  Post-op Assessment: Report given to RN and Post -op Vital signs reviewed and stable  Post vital signs: Reviewed and stable  Last Vitals:  Vitals Value Taken Time  BP    Temp    Pulse    Resp    SpO2      Last Pain:  Vitals:   11/05/20 0951  TempSrc: Oral         Complications: No notable events documented.

## 2020-11-05 NOTE — H&P (Signed)
I have trouble rolling my foreskin back.     01/2020: Jon Jones has had progressive issues with phimosis over the last several months. He has had some loss of penile length but has gained weight. He has to sit to void because of this. He had some benefit with cortisone in the past. He is a diabetic with good control. He has a good urinary stream and empties well. He has pain retracting the foreskin. He has ED but can get a partial erection. He has used Viagra in the past with success. his IPSS is 4. He has some urgency and nocuturia x 1.   03/24/2020: Topical nystatin as well as topical triamcinolone prescribed at time of last office visit in an effort to help improve his phimosis and treat underlying noted severe balanitis. Patient has been quite diligent with instructed 2-3 times daily application of both topical treatment since last office visit. He is no longer having any difficulty retracting his foreskin. No longer having any pain or discomfort with this specifically denying recurrence of bleeding or tearing of the foreskin. Voiding symptoms remain stable by his report. He denies interval burning or painful urination, visible blood in the urine. Patient continues with erectile dysfunction previously responding well to sildenafil. He mentioned pursuing additional treatment options today.   09/22/20: Jon Jones returns today in f/u for his history of phimosis. He has been using the nystatin and triamcinalone with some success but the has recurrence symptoms with in 2 days of stopping the med.    CC: AUA Questions Scoring.  HPI: Jon Jones is a 65 year-old male established patient who is here for evaluation.      AUA Symptom Score: He never has the sensation of not emptying his bladder completely after finishing urinating. He never has to urinate again less that two hours after he has finished urinating. He does not have to stop and start again several times when he urinates. He never finds it difficult to  postpone urination. He never has a weak urinary stream. He never has to push or strain to begin urination. He has to get up to urinate 1 time from the time he goes to bed until the time he gets up in the morning.   Calculated AUA Symptom Score: 1    ALLERGIES: No Known Drug Allergies    MEDICATIONS: Aspirin  Lisinopril 10 mg tablet  Metformin Hcl  Atorvastatin Calcium  Clopidogrel 75 mg tablet  Entresto  Eye Meds  Ezetimibe 10 mg tablet  Glipizide 10 mg tablet  Januvia  Jardiance  Nitroglycerin  Nystatin 100,000 unit/gram cream Apply sparingly to affected area 2-3 x daily  Sildenafil Citrate 100 mg tablet Take 1 tablet PO prn 30-60 minutes before planned activity.  Spironolactone     GU PSH: No GU PSH    NON-GU PSH: Foot surgery (unspecified), L toe Pacemaker placement     GU PMH: Balanoposthitis - 03/24/2020, - 02/09/2020 ED due to arterial insufficiency - 03/24/2020, He has ED but does respond to oral meds but is avoiding erections with the current penile inflammation. , - 02/09/2020 Phimosis - 03/24/2020, He has balanoposthitis with moderate phimosis. I am going to put him on nystatin and triamcinalone cream and have him return in 6 weeks. Hopefully he will be able to avoid circumcision. I strongly recommended he work on weight loss to try to improve his diabetes and reduce the pubic fat pad. , - 02/09/2020    NON-GU PMH: Arrhythmia Arthritis Atrial Fibrillation Diabetes Type  2 DVT, History GERD Gout Heart disease, unspecified Myocardial Infarction Other heart failure Peripheral vascular disease Sleep Apnea    FAMILY HISTORY: 1 Daughter - Other 2 sons - Other Diabetes - Runs in Family Hypertension - Mother   SOCIAL HISTORY: Marital Status: Married Preferred Language: English; Race: White Current Smoking Status: Patient does not smoke anymore. Has not smoked since 01/12/2019.   Tobacco Use Assessment Completed: Used Tobacco in last 30 days? Drinks 2  caffeinated drinks per day.    REVIEW OF SYSTEMS:    GU Review Male:   Patient denies frequent urination, hard to postpone urination, burning/ pain with urination, get up at night to urinate, leakage of urine, stream starts and stops, trouble starting your stream, have to strain to urinate , erection problems, and penile pain.  Gastrointestinal (Upper):   Patient denies nausea, vomiting, and indigestion/ heartburn.  Gastrointestinal (Lower):   Patient denies diarrhea and constipation.  Constitutional:   Patient denies fever, night sweats, weight loss, and fatigue.  Skin:   Patient denies skin rash/ lesion and itching.  Eyes:   Patient denies blurred vision and double vision.  Ears/ Nose/ Throat:   Patient denies sore throat and sinus problems.  Hematologic/Lymphatic:   Patient denies swollen glands and easy bruising.  Cardiovascular:   Patient denies leg swelling and chest pains.  Respiratory:   Patient denies cough and shortness of breath.  Endocrine:   Patient denies excessive thirst.  Musculoskeletal:   Patient denies back pain and joint pain.  Neurological:   Patient denies headaches and dizziness.  Psychologic:   Patient denies depression and anxiety.   VITAL SIGNS: None   GU PHYSICAL EXAMINATION:    Scrotum: No lesions. No edema. No cysts. No warts.  Epididymides: Right: no spermatocele, no masses, no cysts, no tenderness, no induration, no enlargement. Left: no spermatocele, no masses, no cysts, no tenderness, no induration, no enlargement.  Testes: No tenderness, no swelling, no enlargement left testes. No tenderness, no swelling, no enlargement right testes. Normal location left testes. Normal location right testes. No mass, no cyst, no varicocele, no hydrocele left testes. No mass, no cyst, no varicocele, no hydrocele right testes.  Urethral Meatus: Normal size. No lesion, no wart, no discharge, no polyp. Normal location.  Penis: Penis uncircumcised, phimosis which is mild.  Balanitis which is moderate. No foreskin warts, no cracks. No dorsal peyronie's plaques, no left corporal peyronie's plaques, no right corporal peyronie's plaques, no scarring, no shaft warts. No meatal stenosis.    MULTI-SYSTEM PHYSICAL EXAMINATION:    Constitutional: Obese. No physical deformities. Normally developed. Good grooming.   Respiratory: Normal breath sounds. No labored breathing, no use of accessory muscles.   Cardiovascular: Regular rate and rhythm. No murmur, no gallop.     Complexity of Data:  Records Review:   Previous Patient Records  Urine Test Review:   Urinalysis   PROCEDURES:          Urinalysis w/Scope Dipstick Dipstick Cont'd Micro  Color: Yellow Bilirubin: Neg mg/dL WBC/hpf: 0 - 5/hpf  Appearance: Slightly Cloudy Ketones: Neg mg/dL RBC/hpf: 0 - 2/hpf  Specific Gravity: 1.020 Blood: 1+ ery/uL Bacteria: Few (10-25/hpf)  pH: <=5.0 Protein: Trace mg/dL Cystals: NS (Not Seen)  Glucose: 2+ mg/dL Urobilinogen: 0.2 mg/dL Casts: NS (Not Seen)    Nitrites: Neg Trichomonas: Not Present    Leukocyte Esterase: 1+ leu/uL Mucous: Not Present      Epithelial Cells: NS (Not Seen)      Yeast: NS (Not Seen)  Sperm: Not Present    ASSESSMENT:      ICD-10 Details  1 GU:   Phimosis - N47.1 Chronic, Stable, Improving - He has done better with the topical therapy and will continue that for now but he would like to proceed with circumcision which I will arrange. I have reviewed the risks of bleeding, infection, penile injury and scarring, tethered skin, recurrent phimosis, pain, thrombotic events and anesthetic complications. I will get him cleared by Cardiology since he is on Plavix and ASA.   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    PLAN:            Medications Refill Meds: Nystatin 100,000 unit/gram cream Apply sparingly to affected area 2-3 x daily   #30  5 Refill(s)    New Meds: Triamcinolone Acetonide 0.1 % ointment use sparingly to  affected area 2x daily   #30  3 Refill(s)            Schedule Return Visit/Planned Activity: Next Available Appointment - Schedule Surgery

## 2020-11-05 NOTE — Op Note (Signed)
  Preoperative diagnosis: Phimosis with balanitis  Postoperative diagnosis:  Phimosis with balanitis  Procedure:  Circumcision  Surgeon: [name]. M.D.  Anesthesia: general with local  Complications: None  EBL: 21ml  Specimens: Foreskin  Disposition of specimens: discarded  Indications:  This patient is to undergo circumcision for symptomatic phimosis with recurrent balanitis.  Procedure:   The patient was placed in the supine position and anesthesia was induced.  The genitalia was prepped with betadine solution and he was draped in the usual sterile fashion.   A penile block was performed with 10cc of a 50/50 mix of  1% lidocaine and  0.25% marcaine.  The redundant foreskin was excised using the sleeve resection technique and hemostasis was achieved with the Bovie.  The skin edges were reapproximated with a running 4-0 chromic that was tied at the quadrants.  The wound was cleaned and dried.  A dressing of Xeroform, Kling and Coban was applied.  The anesthetic was reversed and he was taken to the recovery room in stable condition.  There were no complications.

## 2020-11-06 ENCOUNTER — Encounter (HOSPITAL_COMMUNITY): Payer: Self-pay | Admitting: Urology

## 2020-11-07 ENCOUNTER — Encounter (INDEPENDENT_AMBULATORY_CARE_PROVIDER_SITE_OTHER): Payer: Self-pay | Admitting: Ophthalmology

## 2020-11-11 ENCOUNTER — Telehealth: Payer: Self-pay | Admitting: Specialist

## 2020-11-11 DIAGNOSIS — R32 Unspecified urinary incontinence: Secondary | ICD-10-CM | POA: Diagnosis not present

## 2020-11-11 DIAGNOSIS — E119 Type 2 diabetes mellitus without complications: Secondary | ICD-10-CM | POA: Diagnosis not present

## 2020-11-11 DIAGNOSIS — G4733 Obstructive sleep apnea (adult) (pediatric): Secondary | ICD-10-CM | POA: Diagnosis not present

## 2020-11-11 NOTE — Telephone Encounter (Signed)
Pt called stating he was supposed to be set up for surgery but hadn't heard anything. Pt would like a CB to set this up please.   [phone]

## 2020-11-11 NOTE — Telephone Encounter (Signed)
Put blue on Dr. Barbaraann Faster desk to complete

## 2020-11-12 NOTE — Telephone Encounter (Signed)
I called and advised patient that Dr. Otelia Sergeant has to complete the surgery form, and we will need to get clearance from his other Drs. And then we will be calling to get him scheduled.  And he understood this process.

## 2020-11-19 ENCOUNTER — Telehealth: Payer: Self-pay | Admitting: Internal Medicine

## 2020-11-19 ENCOUNTER — Other Ambulatory Visit: Payer: Self-pay | Admitting: Cardiology

## 2020-11-19 DIAGNOSIS — G4733 Obstructive sleep apnea (adult) (pediatric): Secondary | ICD-10-CM

## 2020-11-19 NOTE — Telephone Encounter (Signed)
I gave surgery sheet to sherrie

## 2020-11-19 NOTE — Telephone Encounter (Signed)
Patient is all out of this, please send short supply.Medication Refill - Medication: cf [id number] felt pollen cpap filter   Has the patient contacted their pharmacy? yes (Agent: If no, request that the patient contact the pharmacy for the refill.) (Agent: If yes, when and what did the pharmacy advise?)contact pcp  Preferred Pharmacy (with phone number or street name): [facility]  [address] [place] [address]   Phone #[phone]   Agent: Please be advised that RX refills may take up to 3 business days. We ask that you follow-up with your pharmacy.

## 2020-11-23 NOTE — Telephone Encounter (Signed)
Rx has been faxed.

## 2020-11-25 ENCOUNTER — Telehealth: Payer: Self-pay | Admitting: *Deleted

## 2020-11-25 NOTE — Telephone Encounter (Signed)
   [facility] Pre-operative Risk Assessment    Patient Name: Jon Jones  DOB: Oct 05, 1955 MRN: [id number]  Request for surgical clearance:  What type of surgery is being performed? LUMBAR FUSION  When is this surgery scheduled? TBD  What type of clearance is required (medical clearance vs. Pharmacy clearance to hold med vs. Both)? BOTH  Are there any medications that need to be held prior to surgery and how long? PLAVIX AND ASA  Practice name and name of physician performing surgery? [facility] DR Basil Dess  What is the office phone number? [phone]   7.   What is the office fax number? [phone]  8.   Anesthesia type (None, local, MAC, general) ? CHOICE   Devra Dopp 11/25/2020, 3:51 PM  _________________________________________________________________   (provider comments below)

## 2020-11-26 NOTE — Telephone Encounter (Addendum)
   Name: Kailan Laws  DOB: [birth year]  MRN: [id number]   Primary Cardiologist: [name], MD  Chart reviewed as part of pre-operative protocol coverage. Patient was contacted 11/26/2020 in reference to pre-operative risk assessment for pending surgery as outlined below.  Paulo Keimig was last seen on 08/30/20 by Dr. [name].  Since that day, Xerxes Agrusa has done well without any symptoms concerning for angina. As outlined in his recent preoperative messages since that time, and per Dr. [name], he may hold ASA for Plavix for 5-7 days prior to his procedure with restart when felt safe to do so from a bleeding standpoint.  Therefore, based on ACC/AHA guidelines, the patient would be at acceptable risk for the planned procedure without further cardiovascular testing.   The patient was advised that if he develops new symptoms prior to surgery to contact our office to arrange for a follow-up visit, and he verbalized understanding.  I will route this recommendation to the requesting party via Epic fax function and remove from pre-op pool. Please call with questions.  Lennon Alstrom, PA-C 11/26/2020, 4:47 PM

## 2020-11-29 ENCOUNTER — Other Ambulatory Visit: Payer: Self-pay

## 2020-11-29 ENCOUNTER — Ambulatory Visit (HOSPITAL_COMMUNITY)
Admission: EM | Admit: 2020-11-29 | Discharge: 2020-11-29 | Disposition: A | Discharge status: Home / Self Care | Payer: Medicaid Other | Attending: Physician Assistant | Admitting: Physician Assistant

## 2020-11-29 ENCOUNTER — Encounter (HOSPITAL_COMMUNITY): Payer: Self-pay | Admitting: Emergency Medicine

## 2020-11-29 ENCOUNTER — Ambulatory Visit: Payer: Self-pay | Admitting: *Deleted

## 2020-11-29 DIAGNOSIS — U071 COVID-19: Secondary | ICD-10-CM | POA: Diagnosis not present

## 2020-11-29 DIAGNOSIS — R0981 Nasal congestion: Secondary | ICD-10-CM

## 2020-11-29 DIAGNOSIS — R509 Fever, unspecified: Secondary | ICD-10-CM

## 2020-11-29 MED ORDER — MOLNUPIRAVIR EUA 200MG CAPSULE: 4.0000 | ORAL_CAPSULE | Freq: Two times a day (BID) | ORAL | 0 refills | Status: AC

## 2020-11-29 NOTE — ED Provider Notes (Signed)
[facility]    CSN: [id number] Arrival date & time: 11/29/20  1456      History   Chief Complaint Chief Complaint  Patient presents with   Headache   Nasal Congestion    HPI Jon Jones is a 65 y.o. male.   Patient presents today with a 1.5-day history of URI symptoms.  Reports headache, fever, rhinorrhea, dizziness that resulted in 1 fall.  He is confident that he did not hit his head during the fall and denies any loss of consciousness, nausea, vomiting, vision changes, significant headache, persistent dizziness, amnesia surrounding event.  Wife recently tested positive for COVID-19 at home and so when he took a test he had a faint positive test as well.  He is up-to-date on COVID-19 vaccinations including booster.  Denies any recent antibiotic use.  He has not tried any over-the-counter medication for symptom management.  Denies any cough, shortness of breath, chest pain, nausea, vomiting, diarrhea.  He is a former smoker with quit date approximately 4 years ago.  He does have several medical conditions that increase his risk of complications including type 2 diabetes, heart failure, CAD.  He is interested in antiviral medications.   Past Medical History:  Diagnosis Date   5 years ago    AICD (automatic cardioverter/defibrillator) present    Anxiety    Arthritis    Cataract    CHF (congestive heart failure) ([name])    Coronary artery disease    Diabetes mellitus without complication ([name])    Diabetic retinopathy ([place])    Dysrhythmia    GERD (gastroesophageal reflux disease)    PMH   Hypertension    Hypertensive retinopathy    Peripheral vascular disease ([name])    Peripheral vascular disease ([place])    Pneumonia    Shoulder impingement, right    Sleep apnea    wears cpap   Stomach ulcer    Wears glasses     Patient Active Problem List   Diagnosis Date Noted   Phimosis of penis 10/06/2020   Cellulitis 09/29/2020   Cellulitis of right toe 09/29/2020    Chronic combined systolic and diastolic heart failure ([place]) 09/15/2020   Onychomycosis of toenail 08/24/2020   Diabetes mellitus ([place]) 07/23/2020   Type 2 diabetes mellitus with hyperglycemia, without long-term current use of insulin ([place]) 07/23/2020   Weight gain 07/23/2020   Type 2 diabetes mellitus with peripheral neuropathy ([place]) 07/23/2020   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 diabetes mellitus ([place]) 05/04/2020   Hypertensive heart disease with heart failure ([place]) 04/26/2020   Type 2 diabetes mellitus with diabetic peripheral angiopathy without gangrene, without long-term current use of insulin ([place]) 04/26/2020   OSA on CPAP 04/26/2020   PAD (peripheral artery disease) ([place]) 04/26/2020   CAD in native artery 04/26/2020   Class 2 severe obesity due to excess calories with serious comorbidity and body mass index (BMI) of 38.0 to 38.9 in adult [facility]) 04/26/2020   Impingement syndrome of right shoulder    Nontraumatic complete tear of right rotator cuff    Type 2 diabetes mellitus with foot ulcer, without long-term current use of insulin ([name])    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   S/P debridement 07/19/2016   Acute gout of left elbow 07/19/2016    Past Surgical History:  Procedure Laterality Date   AMPUTATION Left 03/12/2019   Procedure: 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;  Surgeon: [name], MD;  Location: [facility];  Service: Orthopedics;  Laterality: Left;   AMPUTATION Right 10/01/2020   Procedure: RIGHT 2nd TOE AMPUTATION;  Surgeon: Felipa Furnace, DPM;  Location: [facility];  Service: Podiatry;  Laterality: Right;   CARDIAC CATHETERIZATION     CARDIAC DEFIBRILLATOR PLACEMENT     CIRCUMCISION N/A 11/05/2020   Procedure: CIRCUMCISION ADULT;  Surgeon: Irine Seal, MD;  Location: [facility];  Service: Urology;  Laterality: N/A;   I & D EXTREMITY Left 07/19/2016   Procedure: IRRIGATION AND DEBRIDEMENT EXTREMITY/OLECRANON(WASHOUT);  Surgeon: Leandrew Koyanagi, MD;  Location: [facility];   Service: Orthopedics;  Laterality: Left;   OLECRANON BURSECTOMY Left 07/19/2016   Procedure: LEFT ELBOW OLECRANON BURSECTOMY;  Surgeon: Leandrew Koyanagi, MD;  Location: [facility];  Service: Orthopedics;  Laterality: Left;   SHOULDER ARTHROSCOPY Right 05/16/2019   Procedure: Right Shoulder Arthroscopy;  Surgeon: [name], MD;  Location: [facility];  Service: Orthopedics;  Laterality: Right;       Home Medications    Prior to Admission medications   Medication Sig Start Date End Date Taking? Authorizing Provider  molnupiravir EUA (LAGEVRIO) 200 mg CAPS capsule Take 4 capsules (800 mg total) by mouth 2 (two) times daily for 5 days. 11/29/20 12/04/20 Yes Kaelene Elliston, Derry Skill, PA-C  Accu-Chek Softclix Lancets lancets CHECK BLOOD SUGAR ONCE DAILY 08/12/20   Ladell Pier, MD  acetaminophen (TYLENOL) 325 MG tablet Take 2 tablets (650 mg total) by mouth every 6 (six) hours as needed for mild pain (or Fever >/= 101). 10/01/20   Lurline Del, DO  aspirin 81 MG EC tablet Take 81 mg by mouth daily. 09/12/18   [provider]  atorvastatin (LIPITOR) 80 MG tablet Take 1 tablet (80 mg total) by mouth every evening. 09/15/20   Donato Heinz, MD  Blood Glucose Monitoring Suppl (ACCU-CHEK GUIDE ME) w/Device KIT Check blood sugar once daily. 04/30/20   Ladell Pier, MD  ciclopirox (PENLAC) 8 % solution Apply topically at bedtime. Apply over nail and surrounding skin. Apply daily over previous coat. After seven (7) days, may remove with alcohol and continue cycle. 08/24/20   Ladell Pier, MD  clobetasol ointment (TEMOVATE) 8.50 % Apply 1 application topically 2 (two) times daily as needed. 04/30/20   Ladell Pier, MD  clopidogrel (PLAVIX) 75 MG tablet TAKE 1 TABLET BY MOUTH EVERY DAY 11/19/20   Troy Sine, MD  empagliflozin (JARDIANCE) 25 MG TABS tablet Take 1 tablet (25 mg total) by mouth at bedtime. 07/23/20   Shamleffer, Melanie Crazier, MD  gabapentin (NEURONTIN) 300 MG  capsule TAKE 1 CAPSULE(300 MG) BY MOUTH AT BEDTIME 10/07/20   Ladell Pier, MD  glipiZIDE (GLUCOTROL) 10 MG tablet Take 2 tablets (20 mg total) by mouth 2 (two) times daily. 07/23/20   Shamleffer, Melanie Crazier, MD  glucose blood (ACCU-CHEK AVIVA PLUS) test strip Test BS 3x/day with meals 08/24/20   Ladell Pier, MD  metoprolol succinate (TOPROL-XL) 100 MG 24 hr tablet Take 1 tablet (100 mg total) by mouth daily. 09/15/20   Donato Heinz, MD  Misc. Devices MISC Cpap supplies-tubing mask Dx G47.33 and Z99.89 07/20/20   Ladell Pier, MD  nitroGLYCERIN (NITROSTAT) 0.4 MG SL tablet Place 0.4 mg under the tongue See admin instructions. Every 5 minutes as needed for chest pain. Max of 3 doses 04/03/16   [provider]  nystatin cream (MYCOSTATIN) Apply 1 application topically 3 (three) times daily as needed for dry skin. 05/10/20   [provider]  PROAIR HFA 108 (90 Base) MCG/ACT inhaler INHALE 2 PUFFS INTO THE LUNGS EVERY 6 HOURS AS NEEDED FOR WHEEZING OR SHORTNESS OF BREATH 10/13/20   Ladell Pier, MD  RESTASIS 0.05 % ophthalmic emulsion Place 1 drop into both eyes in the morning and at bedtime. 09/25/20   [provider]  sacubitril-valsartan (ENTRESTO) 24-26 MG Take 1 tablet by mouth 2 (two) times daily. 09/10/20   Donato Heinz, MD  sitaGLIPtin-metformin (JANUMET) 50-1000 MG tablet Take 1 tablet by mouth 2 (two) times daily with a meal. 10/28/20   Shamleffer, Melanie Crazier, MD  spironolactone (ALDACTONE) 25 MG tablet Take 0.5 tablets (12.5 mg total) by mouth daily. 09/15/20   Donato Heinz, MD  triamcinolone (KENALOG) 0.1 % Apply 1 application topically 2 (two) times daily. 05/10/20   [provider]  triamcinolone ointment (KENALOG) 0.1 % Apply 1 application topically in the morning and at bedtime. 09/22/20   [provider]    Family History Family History  Problem Relation Age of Onset   Heart disease Mother     Diabetes Mother    Peripheral vascular disease Mother     Social History Social History   Tobacco Use   Smoking status: Former    Packs/day: 1.00    Years: 40.00    Pack years: 40.00    Types: Cigarettes    Quit date: 03/11/2019    Years since quitting: 1.7   Smokeless tobacco: Never  Vaping Use   Vaping Use: Never used  Substance Use Topics   Alcohol use: Not Currently   Drug use: Not Currently    Types: Oxycodone     Allergies   Patient has no known allergies.   Review of Systems Review of Systems  Constitutional:  Positive for activity change, fatigue and fever. Negative for appetite change.  HENT:  Positive for congestion and rhinorrhea. Negative for sinus pressure, sneezing and sore throat.   Respiratory:  Negative for cough and shortness of breath.   Cardiovascular:  Negative for chest pain.  Gastrointestinal:  Negative for abdominal pain, diarrhea, nausea and vomiting.  Musculoskeletal:  Negative for arthralgias and myalgias.  Neurological:  Positive for headaches. Negative for dizziness and light-headedness.    Physical Exam Triage Vital Signs ED Triage Vitals [11/29/20 1635]  Enc Vitals Group     BP (!) 130/57     Pulse Rate (!) 101     Resp 19     Temp (!) 100.7 F (38.2 C)     Temp Source Oral     SpO2 98 %     Weight      Height      Head Circumference      Peak Flow      Pain Score 0     Pain Loc      Pain Edu?      Excl. in [place]?    No data found.  Updated Vital Signs BP (!) 130/57 (BP Location: Right Arm)   Pulse (!) 101   Temp (!) 100.7 F (38.2 C) (Oral)   Resp 19   SpO2 98%   Visual Acuity Right Eye Distance:   Left Eye Distance:   Bilateral Distance:    Right Eye Near:   Left Eye Near:    Bilateral Near:     Physical Exam Vitals reviewed.  Constitutional:      General: He is awake.     Appearance: Normal appearance. He is well-developed and overweight.  He is not ill-appearing.     Comments: Very pleasant male  appears stated age in no acute distress sitting comfortably in exam room  HENT:     Head: Normocephalic and atraumatic.     Right Ear: Tympanic membrane, ear canal and external ear normal. Tympanic membrane is not erythematous or bulging.     Left Ear: Tympanic membrane, ear canal and external ear normal. Tympanic membrane is not erythematous or bulging.     Nose: Nose normal.     Mouth/Throat:     Pharynx: Uvula midline. Posterior oropharyngeal erythema present. No oropharyngeal exudate.  Cardiovascular:     Rate and Rhythm: Normal rate and regular rhythm.     Heart sounds: Normal heart sounds, S1 normal and S2 normal. No murmur heard. Pulmonary:     Effort: Pulmonary effort is normal. No accessory muscle usage or respiratory distress.     Breath sounds: Normal breath sounds. No stridor. No wheezing, rhonchi or rales.     Comments: Clear to auscultation bilaterally Abdominal:     General: Bowel sounds are normal.     Palpations: Abdomen is soft.     Tenderness: There is no abdominal tenderness.  Neurological:     Mental Status: He is alert.  Psychiatric:        Behavior: Behavior is cooperative.     UC Treatments / Results  Labs (all labs ordered are listed, but only abnormal results are displayed) Labs Reviewed - No data to display  EKG   Radiology No results found.  Procedures Procedures (including critical care time)  Medications Ordered in UC Medications - No data to display  Initial Impression / Assessment and Plan / UC Course  I have reviewed the triage vital signs and the nursing notes.  Pertinent labs & imaging results that were available during my care of the patient were reviewed by me and considered in my medical decision making (see chart for details).      No indication for repeat COVID-19 testing given positive at-home test today.  Discussed with patient potential utility of oral antiviral medications given past medical history that increases risk of  severe disease; patient is open to this and so we will begin antiviral medication.  Unfortunately, he is not a candidate for Paxlovid given he takes Plavix so we will prescribe molnupiravir.  Patient was encouraged use over-the-counter medications including Tylenol and Mucinex for additional symptom relief.  Recommended that he rest and drink plenty of fluid.  Discussed alarm symptoms that warrant emergent evaluation.  Strict return precautions given to which she expressed understanding.  Final Clinical Impressions(s) / UC Diagnoses   Final diagnoses:  COVID-19  Nasal congestion  Fever, unspecified     Discharge Instructions      Your symptoms are related to COVID.  Unfortunately this is a virus and has to run its course.  We have started a medication to decrease how quickly the virus can replicate in the hopes that your body will fight it off sooner.  Please take this as prescribed.  You can take over-the-counter medications including Mucinex and Tylenol.  If you have any worsening symptoms including high fever, chest pain, shortness of breath, weakness you need to be reevaluated immediately.  Follow-up with your primary care provider within a week to ensure symptom improvement.     ED Prescriptions     Medication Sig Dispense Auth. Provider   molnupiravir EUA (LAGEVRIO) 200 mg CAPS capsule Take 4 capsules (800 mg total) by  mouth 2 (two) times daily for 5 days. 40 capsule Suzzanne Brunkhorst K, PA-C      PDMP not reviewed this encounter.   Terrilee Croak, PA-C 11/29/20 1703

## 2020-11-29 NOTE — Telephone Encounter (Signed)
Called patient back to schedule a virtual appointment tomorrow for viral medications to help Covid sx's.   Per patient, was told by his insurance company to go to an Urgent Care.   Patient was at Urgent when I called to schedule appt with Primary Care at [facility].

## 2020-11-29 NOTE — Discharge Instructions (Addendum)
Your symptoms are related to COVID.  Unfortunately this is a virus and has to run its course.  We have started a medication to decrease how quickly the virus can replicate in the hopes that your body will fight it off sooner.  Please take this as prescribed.  You can take over-the-counter medications including Mucinex and Tylenol.  If you have any worsening symptoms including high fever, chest pain, shortness of breath, weakness you need to be reevaluated immediately.  Follow-up with your primary care provider within a week to ensure symptom improvement.

## 2020-11-29 NOTE — ED Triage Notes (Signed)
Pt had headache and congestion for day and half. Today had covid test that was positive or borderline positive. Reports insurance company referred to be seen.

## 2020-11-29 NOTE — Telephone Encounter (Signed)
Per agent:  "Pt states he just tested positive for covid.  Pt has runny nose, headache. Pt wants to know what he should do? "  CAlled pt, states he and his wife both tested positive with home tests. They are presently in UC as insurance stated they needed to be seen within 5 days. "So we went to UC."   Reports he has a runny nose, no other symptoms presently. Assured pt NT would route to practice for PCPs review. Pt verbalizes understanding.       Reason for Disposition  [1] COVID-19 diagnosed by positive lab test (e.g., PCR, rapid self-test kit) AND [2] mild symptoms (e.g., cough, fever, others) AND [9] no complications or SOB  Answer Assessment - Initial Assessment Questions 1. COVID-19 DIAGNOSIS: "Who made your COVID-19 diagnosis?" "Was it confirmed by a positive lab test or self-test?" If not diagnosed by a doctor (or NP/PA), ask "Are there lots of cases (community spread) where you live?" Note: See public health department website, if unsure.   Please see triage summary 2. COVID-19 EXPOSURE: "Was there any known exposure to COVID before the symptoms began?" CDC Definition of close contact: within 6 feet (2 meters) for a total of 15 minutes or more over a 24-hour period.      *No Answer* 3. ONSET: "When did the COVID-19 symptoms start?"      *No Answer* 4. WORST SYMPTOM: "What is your worst symptom?" (e.g., cough, fever, shortness of breath, muscle aches)     *No Answer* 5. COUGH: "Do you have a cough?" If Yes, ask: "How bad is the cough?"       *No Answer* 6. FEVER: "Do you have a fever?" If Yes, ask: "What is your temperature, how was it measured, and when did it start?"     *No Answer* 7. RESPIRATORY STATUS: "Describe your breathing?" (e.g., shortness of breath, wheezing, unable to speak)      *No Answer* 8. BETTER-SAME-WORSE: "Are you getting better, staying the same or getting worse compared to yesterday?"  If getting worse, ask, "In what way?"     *No Answer* 9. HIGH RISK  DISEASE: "Do you have any chronic medical problems?" (e.g., asthma, heart or lung disease, weak immune system, obesity, etc.)     *No Answer* 10. VACCINE: "Have you had the COVID-19 vaccine?" If Yes, ask: "Which one, how many shots, when did you get it?"       *No Answer* 11. BOOSTER: "Have you received your COVID-19 booster?" If Yes, ask: "Which one and when did you get it?"       *No Answer* 12. PREGNANCY: "Is there any chance you are pregnant?" "When was your last menstrual period?"       *No Answer* 13. OTHER SYMPTOMS: "Do you have any other symptoms?"  (e.g., chills, fatigue, headache, loss of smell or taste, muscle pain, sore throat)       *No Answer* 14. O2 SATURATION MONITOR:  "Do you use an oxygen saturation monitor (pulse oximeter) at home?" If Yes, ask "What is your reading (oxygen level) today?" "What is your usual oxygen saturation reading?" (e.g., 95%)       *No Answer*  Protocols used: Coronavirus (COVID-19) Diagnosed or Suspected-A-AH

## 2020-11-30 ENCOUNTER — Other Ambulatory Visit: Payer: Self-pay | Admitting: Internal Medicine

## 2020-11-30 NOTE — Telephone Encounter (Signed)
Requested medications are due for refill today 1 week early  Requested medications are on the active medication list yes  Last refill 11/06/20  Last visit 08/2020  Future visit scheduled No  Notes to clinic Asking for inhaler one week early, however, pt was seen in ED yesterday for COVID, please assess.

## 2020-12-14 ENCOUNTER — Other Ambulatory Visit: Payer: Self-pay

## 2020-12-14 ENCOUNTER — Ambulatory Visit: Payer: Medicaid Other | Admitting: Cardiovascular Disease

## 2020-12-14 VITALS — BP 134/80 | HR 80 | Ht 66.0 in | Wt 235.0 lb

## 2020-12-14 DIAGNOSIS — E1142 Type 2 diabetes mellitus with diabetic polyneuropathy: Secondary | ICD-10-CM

## 2020-12-14 DIAGNOSIS — Z95810 Presence of automatic (implantable) cardiac defibrillator: Secondary | ICD-10-CM

## 2020-12-14 DIAGNOSIS — E785 Hyperlipidemia, unspecified: Secondary | ICD-10-CM

## 2020-12-14 DIAGNOSIS — I10 Essential (primary) hypertension: Secondary | ICD-10-CM | POA: Diagnosis not present

## 2020-12-14 DIAGNOSIS — I5042 Chronic combined systolic (congestive) and diastolic (congestive) heart failure: Secondary | ICD-10-CM

## 2020-12-14 DIAGNOSIS — G4733 Obstructive sleep apnea (adult) (pediatric): Secondary | ICD-10-CM | POA: Diagnosis not present

## 2020-12-14 NOTE — Progress Notes (Signed)
Cardiology Office Note    Date:  12/21/2020   ID:  Jon Jones, DOB 24-Jul-1955, MRN [id number]  PCP:  Ladell Pier, MD  Cardiologist:  Shelva Majestic, MD (sleep); Dr. Gardiner Rhyme  New sleep evaluation   History of Present Illness:  Jon Jones is a 65 y.o. male who is followed by Dr. Beatrix Fetters for his cardiology care.  He has known CAD and suffered a myocardial infarction in 2016.  Due to resultant ischemic cardiomyopathy he underwent ICD implantation.  He has a history of type 2 diabetes mellitus, PAD, hyperlipidemia, as well as obstructive sleep apnea.  He was previously followed with sleep medicine in [place] and now that he is in the [place] area he was referred to me to establish sleep medicine care in [place].  He had undergone a sleep study while living in [place] years ago and was told of stopping breathing approximately 90 times per hour.  He was initially set on CPAP therapy at maximal 20 cm of water.  Apparently his DME company was [employer].  He has not had any recent downloads.  Over the past month he has had problems with his mask fitting and he has noticed more leak.  He admits to 100% compliance with CPAP therapy.  He typically goes to bed between 11 PM and midnight and wakes up at 9 AM.  He has nocturia approximately 1-2 times per night but otherwise believes he is sleeping well.  He is on Medicaid for insurance.  He presents to establish sleep care with me.   Past Medical History:  Diagnosis Date   5 years ago    AICD (automatic cardioverter/defibrillator) present    Anxiety    Arthritis    Cataract    CHF (congestive heart failure) ([name])    Coronary artery disease    Diabetes mellitus without complication ([name])    Diabetic retinopathy ([place])    Dysrhythmia    GERD (gastroesophageal reflux disease)    PMH   Hypertension    Hypertensive retinopathy    Peripheral vascular disease ([name])    Peripheral vascular disease ([name])    Pneumonia    Shoulder  impingement, right    Sleep apnea    wears cpap   Stomach ulcer    Wears glasses     Past Surgical History:  Procedure Laterality Date   AMPUTATION Left 03/12/2019   Procedure: 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;  Surgeon: [name], MD;  Location: [facility];  Service: Orthopedics;  Laterality: Left;   AMPUTATION Right 10/01/2020   Procedure: RIGHT 2nd TOE AMPUTATION;  Surgeon: Felipa Furnace, DPM;  Location: [facility];  Service: Podiatry;  Laterality: Right;   CARDIAC CATHETERIZATION     CARDIAC DEFIBRILLATOR PLACEMENT     CIRCUMCISION N/A 11/05/2020   Procedure: CIRCUMCISION ADULT;  Surgeon: Irine Seal, MD;  Location: [facility];  Service: Urology;  Laterality: N/A;   I & D EXTREMITY Left 07/19/2016   Procedure: IRRIGATION AND DEBRIDEMENT EXTREMITY/OLECRANON(WASHOUT);  Surgeon: Leandrew Koyanagi, MD;  Location: [facility];  Service: Orthopedics;  Laterality: Left;   OLECRANON BURSECTOMY Left 07/19/2016   Procedure: LEFT ELBOW OLECRANON BURSECTOMY;  Surgeon: Leandrew Koyanagi, MD;  Location: [facility];  Service: Orthopedics;  Laterality: Left;   SHOULDER ARTHROSCOPY Right 05/16/2019   Procedure: Right Shoulder Arthroscopy;  Surgeon: [name], MD;  Location: [facility];  Service: Orthopedics;  Laterality: Right;    Current Medications: Outpatient Medications Prior to Visit  Medication Sig Dispense Refill   Accu-Chek Softclix Lancets lancets CHECK BLOOD SUGAR ONCE DAILY 100 each 2   acetaminophen (TYLENOL) 325 MG tablet Take 2 tablets (650 mg total) by mouth every 6 (six) hours as needed for mild pain (or Fever >/= 101).     aspirin 81 MG EC tablet Take 81 mg by mouth daily.     atorvastatin (LIPITOR) 80 MG tablet Take 1 tablet (80 mg total) by mouth every evening. 90 tablet 3   ciclopirox (PENLAC) 8 % solution Apply topically at bedtime. Apply over nail and surrounding skin. Apply daily over previous coat. After seven (7) days, may remove with alcohol and continue cycle. 6.6 mL 3   clobetasol ointment  (TEMOVATE) 8.50 % Apply 1 application topically 2 (two) times daily as needed. 30 g 0   clopidogrel (PLAVIX) 75 MG tablet TAKE 1 TABLET BY MOUTH EVERY DAY 90 tablet 0   empagliflozin (JARDIANCE) 25 MG TABS tablet Take 1 tablet (25 mg total) by mouth at bedtime. 90 tablet 2   gabapentin (NEURONTIN) 300 MG capsule TAKE 1 CAPSULE(300 MG) BY MOUTH AT BEDTIME 30 capsule 5   glipiZIDE (GLUCOTROL) 10 MG tablet Take 2 tablets (20 mg total) by mouth 2 (two) times daily. 360 tablet 2   glucose blood (ACCU-CHEK AVIVA PLUS) test strip Test BS 3x/day with meals 100 each 3   metoprolol succinate (TOPROL-XL) 100 MG 24 hr tablet Take 1 tablet (100 mg total) by mouth daily. 90 tablet 3   Misc. Devices MISC Cpap supplies-tubing mask Dx G47.33 and Z99.89 1 Device 0   nitroGLYCERIN (NITROSTAT) 0.4 MG SL tablet Place 0.4 mg under the tongue See admin instructions. Every 5 minutes as needed for chest pain. Max of 3 doses     PROAIR HFA 108 (90 Base) MCG/ACT inhaler INHALE 2 PUFFS INTO THE LUNGS EVERY 6 HOURS AS NEEDED FOR WHEEZING OR SHORTNESS OF BREATH 8.5 g 1   RESTASIS 0.05 % ophthalmic emulsion Place 1 drop into both eyes in the morning and at bedtime.     sacubitril-valsartan (ENTRESTO) 24-26 MG Take 1 tablet by mouth 2 (two) times daily. 60 tablet 11   sitaGLIPtin-metformin (JANUMET) 50-1000 MG tablet Take 1 tablet by mouth 2 (two) times daily with a meal. 180 tablet 3   spironolactone (ALDACTONE) 25 MG tablet Take 0.5 tablets (12.5 mg total) by mouth daily. 45 tablet 3   Blood Glucose Monitoring Suppl (ACCU-CHEK GUIDE ME) w/Device KIT Check blood sugar once daily. 1 kit 0   nystatin cream (MYCOSTATIN) Apply 1 application topically 3 (three) times daily as needed for dry skin.     triamcinolone (KENALOG) 0.1 % Apply 1 application topically 2 (two) times daily.     triamcinolone ointment (KENALOG) 0.1 % Apply 1 application topically in the morning and at bedtime.     No facility-administered medications prior to  visit.     Allergies:   Patient has no known allergies.   Social History   Socioeconomic History   Marital status: Married    Spouse name: Not on file   Number of children: Not on file   Years of education: Not on file   Highest education level: Not on file  Occupational History   Not on file  Tobacco Use   Smoking status: Former    Packs/day: 1.00    Years: 40.00    Pack years: 40.00    Types: Cigarettes    Quit date: 03/11/2019    Years since  quitting: 1.7   Smokeless tobacco: Never  Vaping Use   Vaping Use: Never used  Substance and Sexual Activity   Alcohol use: Not Currently   Drug use: Not Currently    Types: Oxycodone   Sexual activity: Not on file  Other Topics Concern   Not on file  Social History Narrative   Not on file   Social Determinants of Health   Financial Resource Strain: Not on file  Food Insecurity: Not on file  Transportation Needs: Not on file  Physical Activity: Not on file  Stress: Not on file  Social Connections: Not on file    Socially, he was born in [place].  He has lived in multiple states including [place], [place], [place], and more recently [place].  He is married has 3 children and 2 grandchildren.  He had worked in Production assistant, radio.  He does not drink alcohol or use drugs.  He does do occasional aerobic exercise.   Family History:  The patient's family history includes Diabetes in his mother; Heart disease in his mother; Peripheral vascular disease in his mother.  Both parents are deceased.  He has 2 brothers ages 74 and 13 and no sisters.  His children are 53, 34, and 29.  ROS General: Negative; No fevers, chills, or night sweats;  HEENT: Negative; No changes in vision or hearing, sinus congestion, difficulty swallowing Pulmonary: Negative; No cough, wheezing, shortness of breath, hemoptysis Cardiovascular: Prior inferior MI, ischemic cardiomyopathy, status post ICD implantation July 2018. GI: Negative; No  nausea, vomiting, diarrhea, or abdominal pain GU: Negative; No dysuria, hematuria, or difficulty voiding Musculoskeletal: Negative; no myalgias, joint pain, or weakness Hematologic/Oncology: Negative; no easy bruising, bleeding Endocrine: Negative; no heat/cold intolerance; no diabetes Neuro: Negative; no changes in balance, headaches Skin: Negative; No rashes or skin lesions Psychiatric: Negative; No behavioral problems, depression Sleep: Positive for severe obstructive sleep apnea originally told of having an AHI around 90 times per hour.  He has been on CPAP at maximum 20 cm pressure.  He admits to 100% compliance with CPAP therapy.  No bruxism, restless legs, hypnogognic hallucinations, no cataplexy Other comprehensive 14 point system review is negative.   PHYSICAL EXAM:   VS:  BP 134/80   Pulse 80   Ht 5' 6"  (1.676 m)   Wt 235 lb (106.6 kg)   SpO2 98%   BMI 37.93 kg/m     Repeat blood pressure by me was 118/70  Wt Readings from Last 3 Encounters:  12/14/20 235 lb (106.6 kg)  10/28/20 239 lb (108.4 kg)  10/27/20 234 lb (106.1 kg)    General: Alert, oriented, no distress.  Skin: normal turgor, no rashes, warm and dry HEENT: Normocephalic, atraumatic. Pupils equal round and reactive to light; sclera anicteric; extraocular muscles intact;  Nose without nasal septal hypertrophy Mouth/Parynx: Dentures;  Mallinpatti scale 3 Neck: No JVD, no carotid bruits; normal carotid upstroke Lungs: clear to ausculatation and percussion; no wheezing or rales Chest wall: without tenderness to palpitation Heart: PMI not displaced, RRR, s1 s2 normal, 1/6 systolic murmur, no diastolic murmur, no rubs, gallops, thrills, or heaves Abdomen: soft, nontender; no hepatosplenomehaly, BS+; abdominal aorta nontender and not dilated by palpation. Back: no CVA tenderness Pulses 2+ Musculoskeletal: full range of motion, normal strength, no joint deformities Extremities: no clubbing cyanosis or edema,  Homan's sign negative  Neurologic: grossly nonfocal; Cranial nerves grossly wnl Psychologic: Normal mood and affect   Studies/Labs Reviewed:   EKG:  EKG is ordered  today.  ECG (independently read by me):  NSR at 80, no ectopy, QTc 447 msec  Recent Labs: BMP Latest Ref Rng & Units 10/19/2020 10/01/2020 09/30/2020  Glucose 70 - 99 mg/dL 260(H) 188(H) 134(H)  BUN 8 - 23 mg/dL 20 15 15   Creatinine 0.61 - 1.24 mg/dL 0.96 1.13 0.96  BUN/Creat Ratio 10 - 24 - - -  Sodium 135 - 145 mmol/L 138 136 136  Potassium 3.5 - 5.1 mmol/L 4.5 3.7 4.0  Chloride 98 - 111 mmol/L 106 104 106  CO2 22 - 32 mmol/L 24 23 22   Calcium 8.9 - 10.3 mg/dL 9.7 9.3 9.0     Hepatic Function Latest Ref Rng & Units 09/29/2020 04/26/2020 03/11/2019  Total Protein 6.5 - 8.1 g/dL 7.4 7.2 7.1  Albumin 3.5 - 5.0 g/dL 3.5 4.2 3.7  AST 15 - 41 U/L 26 21 34  ALT 0 - 44 U/L 22 26 51(H)  Alk Phosphatase 38 - 126 U/L 59 75 51  Total Bilirubin 0.3 - 1.2 mg/dL 0.6 0.5 0.4    CBC Latest Ref Rng & Units 10/19/2020 10/01/2020 09/30/2020  WBC 4.0 - 10.5 K/uL 8.0 7.4 8.4  Hemoglobin 13.0 - 17.0 g/dL 13.2 12.8(L) 12.0(L)  Hematocrit 39.0 - 52.0 % 42.3 40.1 36.6(L)  Platelets 150 - 400 K/uL 183 202 170   Lab Results  Component Value Date   MCV 95.1 10/19/2020   MCV 92.8 10/01/2020   MCV 91.7 09/30/2020   Lab Results  Component Value Date   TSH 1.88 07/23/2020   Lab Results  Component Value Date   HGBA1C 8.7 (H) 10/19/2020     BNP No results found for: BNP  ProBNP No results found for: PROBNP   Lipid Panel     Component Value Date/Time   CHOL 93 (L) 04/26/2020 1002   TRIG 289 (H) 04/26/2020 1002   HDL 25 (L) 04/26/2020 1002   CHOLHDL 3.7 04/26/2020 1002   LDLCALC 25 04/26/2020 1002   LABVLDL 43 (H) 04/26/2020 1002     RADIOLOGY: No results found.   Additional studies/ records that were reviewed today include:  I reviewed the records of Dr. Beatrix Fetters.  ECHO; 06/01/2020 IMPRESSIONS   1. Left  ventricular ejection fraction, by estimation, is 35 to 40%. The  left ventricle has moderately decreased function. The left ventricle  demonstrates regional wall motion abnormalities (see scoring  diagram/findings for description). There is mild  left ventricular hypertrophy. Left ventricular diastolic parameters were  normal. There is severe akinesis of the left ventricular, basal-mid  inferior wall.   2. Right ventricular systolic function is normal. The right ventricular  size is normal.   3. The mitral valve is normal in structure. No evidence of mitral valve  regurgitation. No evidence of mitral stenosis.   4. The aortic valve is normal in structure. Aortic valve regurgitation is  not visualized.    ZIO PATCH: 06/08/2020 One episode of NSVT lasting 5 beats Rare PVCs (<1% of beats)     Patch Wear Time:  2 days and 22 hours (2022-03-17T01:26:33-0400 to 2022-03-20T00:25:48-0400)   Patient had a min HR of 59 bpm, max HR of 148 bpm, and avg HR of 77 bpm. Predominant underlying rhythm was Sinus Rhythm. 1 run of Ventricular Tachycardia occurred lasting 5 beats with a max rate of 148 bpm (avg 134 bpm). Episode of Ventricular  Tachycardia may be Supraventricular Tachycardia with possible aberrancy. Isolated SVEs were rare (<1.0%), SVE Triplets were rare (<1.0%), and no SVE  Couplets were present. Isolated VEs were rare (<1.0%), VE Couplets were rare (<1.0%), and no VE Triplets  were present.  No patient triggered events.  ASSESSMENT:    1. OSA (obstructive sleep apnea)   2. Chronic combined systolic and diastolic heart failure ([place])   3. ICD (implantable cardioverter-defibrillator) in place   4. Essential hypertension   5. Type 2 diabetes mellitus with peripheral neuropathy ([name])   6. Hyperlipidemia with target LDL less than 70     PLAN:  Mr. Gamal Todisco a very pleasant 65 year old gentleman who was born in [place] and has significant cardiovascular comorbidities including prior inferior  myocardial infarction, subsequent ischemic cardiomyopathy with EF 35%, ICD implantation, history of tobacco, PAD, type 2 diabetes mellitus and hyperlipidemia.  He is on guideline directed medical therapy for his ischemic cardiomyopathy including Entresto, metoprolol succinate, aldosterone blockade with spironolactone and SGLT2 inhibition with Jardiance.  He has continued to be on DAPT with aspirin/Plavix and is on atorvastatin 80 mg for hyperlipidemia with most recent lipid panel in February 2022 at 65.  He apparently was diagnosed with severe sleep apnea while living in [place] and apparently has been on CPAP therapy at 20 cm.  I am not certain when his sleep evaluation was done or how old his current device is.  We were able to ascertain that his DME company and he lived in [place] was [employer] but they have been bought out by [employer].  He admits to excellent compliance with CPAP therapy.  Apparently he has had issues with his mask with development of significant mask leak and is in need for new equipment.  In the office today I gave him a new ResMed AirFit F 30 mask which we had as a sample.  Since it has been many years since his initial evaluation and with high pressure requirement, he would benefit from a reevaluation and ultimately obtain a new machine.  With his significant cardiovascular comorbidities including his ischemic cardiomyopathy ideally we will like to schedule him for a split-night study for reassessment of his severity of OSA and with titration with CPAP and possibly BiPAP.   However, with his Medicaid insurance we will have to see if night study is approved.  I discussed with him the potential adverse consequences of untreated sleep apnea on his cardiovascular health.  He recognizes the importance of continued use and has excellent compliance.  I will see him in 3 months for follow-up following obtaining a new machine and further recommendations will be  made at that time   Medication  Adjustments/Labs and Tests Ordered: Current medicines are reviewed at length with the patient today.  Concerns regarding medicines are outlined above.  Medication changes, Labs and Tests ordered today are listed in the Patient Instructions below. Patient Instructions  Medication Instructions:  The current medical regimen is effective;  continue present plan and medications.  *If you need a refill on your cardiac medications before your next appointment, please call your pharmacy*   Testing/Procedures: Your physician has recommended that you have a sleep study. This test records several body functions during sleep, including: brain activity, eye movement, oxygen and carbon dioxide blood levels, heart rate and rhythm, breathing rate and rhythm, the flow of air through your mouth and nose, snoring, body muscle movements, and chest and belly movement.   Follow-Up: At [facility], you and your health needs are our priority.  As part of our continuing mission to provide you with exceptional heart care, we have created designated  Provider Care Teams.  These Care Teams include your primary Cardiologist (physician) and Advanced Practice Providers (APPs -  Physician Assistants and Nurse Practitioners) who all work together to provide you with the care you need, when you need it.  We recommend signing up for the patient portal called "MyChart".  Sign up information is provided on this After Visit Summary.  MyChart is used to connect with patients for Virtual Visits (Telemedicine).  Patients are able to view lab/test results, encounter notes, upcoming appointments, etc.  Non-urgent messages can be sent to your provider as well.   To learn more about what you can do with MyChart, go to [email or web address].    Your next appointment:   3 month(s)  The format for your next appointment:   In Person  Provider:   Shelva Majestic, MD (sleep)    Signed, Shelva Majestic, MD  12/21/2020 8:57 PM    [facility] [address] [place], [place], [place]  [address] Phone: [phone]

## 2020-12-14 NOTE — Patient Instructions (Signed)
Medication Instructions:  The current medical regimen is effective;  continue present plan and medications.  *If you need a refill on your cardiac medications before your next appointment, please call your pharmacy*   Testing/Procedures: Your physician has recommended that you have a sleep study. This test records several body functions during sleep, including: brain activity, eye movement, oxygen and carbon dioxide blood levels, heart rate and rhythm, breathing rate and rhythm, the flow of air through your mouth and nose, snoring, body muscle movements, and chest and belly movement.   Follow-Up: At [facility], you and your health needs are our priority.  As part of our continuing mission to provide you with exceptional heart care, we have created designated Provider Care Teams.  These Care Teams include your primary Cardiologist (physician) and Advanced Practice Providers (APPs -  Physician Assistants and Nurse Practitioners) who all work together to provide you with the care you need, when you need it.  We recommend signing up for the patient portal called "MyChart".  Sign up information is provided on this After Visit Summary.  MyChart is used to connect with patients for Virtual Visits (Telemedicine).  Patients are able to view lab/test results, encounter notes, upcoming appointments, etc.  Non-urgent messages can be sent to your provider as well.   To learn more about what you can do with MyChart, go to [email or web address].    Your next appointment:   3 month(s)  The format for your next appointment:   In Person  Provider:   Nicki [place], MD (sleep)

## 2020-12-21 ENCOUNTER — Encounter: Payer: Self-pay | Admitting: Cardiovascular Disease

## 2020-12-21 ENCOUNTER — Other Ambulatory Visit: Payer: Self-pay | Admitting: Cardiovascular Disease

## 2020-12-21 DIAGNOSIS — I5042 Chronic combined systolic (congestive) and diastolic (congestive) heart failure: Secondary | ICD-10-CM

## 2020-12-21 DIAGNOSIS — G4733 Obstructive sleep apnea (adult) (pediatric): Secondary | ICD-10-CM

## 2020-12-22 ENCOUNTER — Telehealth: Payer: Self-pay | Admitting: *Deleted

## 2020-12-22 ENCOUNTER — Telehealth: Payer: Self-pay

## 2020-12-22 ENCOUNTER — Other Ambulatory Visit: Payer: Self-pay | Admitting: Cardiovascular Disease

## 2020-12-22 DIAGNOSIS — G4733 Obstructive sleep apnea (adult) (pediatric): Secondary | ICD-10-CM

## 2020-12-22 DIAGNOSIS — Z9989 Dependence on other enabling machines and devices: Secondary | ICD-10-CM

## 2020-12-22 NOTE — Telephone Encounter (Signed)
Patient notified of sleep study appointment. 

## 2020-12-22 NOTE — Telephone Encounter (Signed)
I called patient to discuss surgery.  Patient's A1c was 8.7 in August.  He stated his PCP has been working to get it down.  Will update me on progress.  Patient stated he had cervical spine x-rays at last visit.  Neck is still bothering him.  He understood that Dr. Otelia Sergeant was to call him with results.  Please call about x-rays.

## 2020-12-22 NOTE — Telephone Encounter (Signed)
Copied from CRM [id number]. Topic: General - Other >> Dec 14, 2020 10:21 AM Traci Sermon wrote: Reason for CRM: Pt called in stating when he contacted CPAP supplies they told him PCP did not include 2 filters, pt states pcp needs to send over another script with the 2 filters on it. Please advise.

## 2020-12-22 NOTE — Telephone Encounter (Signed)
-----   Message from Darene Lamer, LPN sent at 52/09/7822  9:55 AM EDT ----- TK ordered sleep study on this patient.   Thank you!

## 2020-12-24 ENCOUNTER — Inpatient Hospital Stay (HOSPITAL_COMMUNITY): Admission: RE | Admit: 2020-12-24 | Payer: Medicaid Other | Source: Ambulatory Visit

## 2020-12-24 ENCOUNTER — Ambulatory Visit: Payer: Medicaid Other

## 2020-12-24 NOTE — Telephone Encounter (Signed)
Contacted [employer] and spoke to [place] and per [place] she doesn't see an order for filters. Rx has been refaxed to [phone]

## 2020-12-27 ENCOUNTER — Ambulatory Visit (HOSPITAL_COMMUNITY)
Admission: RE | Admit: 2020-12-27 | Discharge: 2020-12-27 | Disposition: A | Discharge status: Home / Self Care | Payer: Medicare Other | Source: Ambulatory Visit | Attending: Vascular Surgery | Admitting: Vascular Surgery

## 2020-12-27 ENCOUNTER — Other Ambulatory Visit: Payer: Self-pay

## 2020-12-27 ENCOUNTER — Ambulatory Visit: Payer: Medicaid Other | Admitting: Physician Assistant

## 2020-12-27 VITALS — BP 131/72 | HR 74 | Temp 97.3°F | Resp 18 | Ht 66.0 in | Wt 239.0 lb

## 2020-12-27 DIAGNOSIS — I70213 Atherosclerosis of native arteries of extremities with intermittent claudication, bilateral legs: Secondary | ICD-10-CM | POA: Diagnosis present

## 2020-12-27 NOTE — Progress Notes (Signed)
Office Note     CC:  follow up Requesting Provider:  Marcine Matar, MD  HPI: Jon Jones is a 65 y.o. (01-17-1956) male who presents for follow up of peripheral artery disease. He has had non lifestyle limiting claudication, left greater than right. This is relieved by rest. No rest pain or ulceration.  He explains today that his claudication symptoms are essentially unchanged. He still can walk for about 3-4 minutes and then he says it feels like someone is just grabbing and squeezing his calves. This will improve with rest. He has no pain in his legs or feet at rest. No ulceration. He has been trying to walk more but is very limited by the pain in his legs. He was just diagnosed with spinal stenosis and was told that his symptoms are likely more related to this vs his arterial disease. He is uncertain if he will go forward with surgery at this time. He was seen at Cardiologist recently who also performed what sounds like ABI's. He was told they were normal.  The pt is on a statin for cholesterol management.  The pt is on a daily aspirin.   Other AC:  Plavix The pt is on BB, ACE/ARB for hypertension.   The pt is diabetic.   Tobacco hx:  Former, quit 2020  Past Medical History:  Diagnosis Date   5 years ago    AICD (automatic cardioverter/defibrillator) present    Anxiety    Arthritis    Cataract    CHF (congestive heart failure) ([name])    Coronary artery disease    Diabetes mellitus without complication ([name])    Diabetic retinopathy ([name])    Dysrhythmia    GERD (gastroesophageal reflux disease)    PMH   Hypertension    Hypertensive retinopathy    Peripheral vascular disease ([name])    Peripheral vascular disease ([name])    Pneumonia    Shoulder impingement, right    Sleep apnea    wears cpap   Stomach ulcer    Wears glasses     Past Surgical History:  Procedure Laterality Date   AMPUTATION Left 03/12/2019   Procedure: 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;  Surgeon: [name],  MD;  Location: [facility] OR;  Service: Orthopedics;  Laterality: Left;   AMPUTATION Right 10/01/2020   Procedure: RIGHT 2nd TOE AMPUTATION;  Surgeon: [name], DPM;  Location: [facility] OR;  Service: Podiatry;  Laterality: Right;   CARDIAC CATHETERIZATION     CARDIAC DEFIBRILLATOR PLACEMENT     CIRCUMCISION N/A 11/05/2020   Procedure: CIRCUMCISION ADULT;  Surgeon: [name], MD;  Location: [facility];  Service: Urology;  Laterality: N/A;   I & D EXTREMITY Left 07/19/2016   Procedure: IRRIGATION AND DEBRIDEMENT EXTREMITY/OLECRANON(WASHOUT);  Surgeon: Tarry Kos, MD;  Location: [facility] OR;  Service: Orthopedics;  Laterality: Left;   OLECRANON BURSECTOMY Left 07/19/2016   Procedure: LEFT ELBOW OLECRANON BURSECTOMY;  Surgeon: Tarry Kos, MD;  Location: [facility];  Service: Orthopedics;  Laterality: Left;   SHOULDER ARTHROSCOPY Right 05/16/2019   Procedure: Right Shoulder Arthroscopy;  Surgeon: [name], MD;  Location: [facility] OR;  Service: Orthopedics;  Laterality: Right;    Social History   Socioeconomic History   Marital status: Married    Spouse name: Not on file   Number of children: Not on file   Years of education: Not on file   Highest education level: Not on file  Occupational History   Not  on file  Tobacco Use   Smoking status: Former    Packs/day: 1.00    Years: 40.00    Pack years: 40.00    Types: Cigarettes    Quit date: 03/11/2019    Years since quitting: 1.8   Smokeless tobacco: Never  Vaping Use   Vaping Use: Never used  Substance and Sexual Activity   Alcohol use: Not Currently   Drug use: Not Currently    Types: Oxycodone   Sexual activity: Not on file  Other Topics Concern   Not on file  Social History Narrative   Not on file   Social Determinants of Health   Financial Resource Strain: Not on file  Food Insecurity: Not on file  Transportation Needs: Not on file  Physical Activity: Not on file  Stress: Not on file  Social Connections: Not on file   Intimate Partner Violence: Not on file    Family History  Problem Relation Age of Onset   Heart disease Mother    Diabetes Mother    Peripheral vascular disease Mother     Current Outpatient Medications  Medication Sig Dispense Refill   Accu-Chek Softclix Lancets lancets CHECK BLOOD SUGAR ONCE DAILY 100 each 2   acetaminophen (TYLENOL) 325 MG tablet Take 2 tablets (650 mg total) by mouth every 6 (six) hours as needed for mild pain (or Fever >/= 101).     aspirin 81 MG EC tablet Take 81 mg by mouth daily.     atorvastatin (LIPITOR) 80 MG tablet Take 1 tablet (80 mg total) by mouth every evening. 90 tablet 3   ciclopirox (PENLAC) 8 % solution Apply topically at bedtime. Apply over nail and surrounding skin. Apply daily over previous coat. After seven (7) days, may remove with alcohol and continue cycle. 6.6 mL 3   clobetasol ointment (TEMOVATE) 0.05 % Apply 1 application topically 2 (two) times daily as needed. 30 g 0   clopidogrel (PLAVIX) 75 MG tablet TAKE 1 TABLET BY MOUTH EVERY DAY 90 tablet 0   empagliflozin (JARDIANCE) 25 MG TABS tablet Take 1 tablet (25 mg total) by mouth at bedtime. 90 tablet 2   gabapentin (NEURONTIN) 300 MG capsule TAKE 1 CAPSULE(300 MG) BY MOUTH AT BEDTIME 30 capsule 5   glipiZIDE (GLUCOTROL) 10 MG tablet Take 2 tablets (20 mg total) by mouth 2 (two) times daily. 360 tablet 2   glucose blood (ACCU-CHEK AVIVA PLUS) test strip Test BS 3x/day with meals 100 each 3   metoprolol succinate (TOPROL-XL) 100 MG 24 hr tablet Take 1 tablet (100 mg total) by mouth daily. 90 tablet 3   Misc. Devices MISC Cpap supplies-tubing mask Dx G47.33 and Z99.89 1 Device 0   nitroGLYCERIN (NITROSTAT) 0.4 MG SL tablet Place 0.4 mg under the tongue See admin instructions. Every 5 minutes as needed for chest pain. Max of 3 doses     PROAIR HFA 108 (90 Base) MCG/ACT inhaler INHALE 2 PUFFS INTO THE LUNGS EVERY 6 HOURS AS NEEDED FOR WHEEZING OR SHORTNESS OF BREATH 8.5 g 1   RESTASIS 0.05 %  ophthalmic emulsion Place 1 drop into both eyes in the morning and at bedtime.     sacubitril-valsartan (ENTRESTO) 24-26 MG Take 1 tablet by mouth 2 (two) times daily. 60 tablet 11   sitaGLIPtin-metformin (JANUMET) 50-1000 MG tablet Take 1 tablet by mouth 2 (two) times daily with a meal. 180 tablet 3   spironolactone (ALDACTONE) 25 MG tablet Take 0.5 tablets (12.5 mg total) by mouth  daily. 45 tablet 3   No current facility-administered medications for this visit.    No Known Allergies   REVIEW OF SYSTEMS:  [X]  denotes positive finding, [ ]  denotes negative finding Cardiac  Comments:  Chest pain or chest pressure:    Shortness of breath upon exertion:    Short of breath when lying flat:    Irregular heart rhythm:        Vascular    Pain in calf, thigh, or hip brought on by ambulation:    Pain in feet at night that wakes you up from your sleep:     Blood clot in your veins:    Leg swelling:         Pulmonary    Oxygen at home:    Productive cough:     Wheezing:         Neurologic    Sudden weakness in arms or legs:     Sudden numbness in arms or legs:     Sudden onset of difficulty speaking or slurred speech:    Temporary loss of vision in one eye:     Problems with dizziness:         Gastrointestinal    Blood in stool:     Vomited blood:         Genitourinary    Burning when urinating:     Blood in urine:        Psychiatric    Major depression:         Hematologic    Bleeding problems:    Problems with blood clotting too easily:        Skin    Rashes or ulcers:        Constitutional    Fever or chills:      PHYSICAL EXAMINATION:  Vitals:   12/27/20 0825  BP: 131/72  Pulse: 74  Resp: 18  Temp: (!) 97.3 F (36.3 C)  TempSrc: Temporal  SpO2: 97%  Weight: 239 lb (108.4 kg)  Height: 5\' 6"  (1.676 m)    General:  WDWN in NAD; vital signs documented above Gait: Normal HENT: WNL, normocephalic Pulmonary: normal non-labored breathing , without  wheezing Cardiac: regular HR, without  Murmurs without carotid bruit Abdomen: obese Vascular Exam/Pulses:  Right Left  Radial 2+ (normal) 2+ (normal)  Femoral 2+ (normal) 2+ (normal)  Popliteal 2+ (normal) 2+ (normal)  DP 1+ (weak) 2+ (normal)  PT 2+ (normal) 2+ (normal)   Extremities: without ischemic changes, without Gangrene , without cellulitis; without open wounds;  Musculoskeletal: no muscle wasting or atrophy  Neurologic: A&O X 3;  No focal weakness or paresthesias are detected Psychiatric:  The pt has Normal affect.   Non-Invasive Vascular Imaging:    +-------+-----------+-----------+------------+------------+  ABI/TBIToday's ABIToday's TBIPrevious ABIPrevious TBI  +-------+-----------+-----------+------------+------------+  Right  1.1        0.74       0.92        0.76          +-------+-----------+-----------+------------+------------+  Left   0.98       0.93       1.01        0.64          +-------+-----------+-----------+------------+------------+    ASSESSMENT/PLAN:: 65 y.o. male here for follow up for peripheral artery disease. He has had claudication symptoms in left greater than right leg. His symptoms are essentially unchanged. His ABIs today are essentially unchanged from prior visit. He has triphasic flow bilaterally.  Right ABI 1.1, Left .73 -He was recently diagnosed with spinal stenosis which may be somewhat contributing to his lower extremity symptoms although he certainly describes claudication - I have encouraged him to continue to walk/ have a walking regimen to help improve collateral flow into his legs - he will continue his Aspirin, Statin and Plavix - I will have him return in 6 months with exercise ABI's   Graceann Congress, PA-C [facility] [phone]  Clinic MD:  Dr. Karin Lieu

## 2020-12-31 ENCOUNTER — Other Ambulatory Visit: Payer: Self-pay

## 2020-12-31 DIAGNOSIS — I739 Peripheral vascular disease, unspecified: Secondary | ICD-10-CM

## 2021-01-01 ENCOUNTER — Other Ambulatory Visit: Payer: Self-pay | Admitting: Internal Medicine

## 2021-01-01 DIAGNOSIS — E1142 Type 2 diabetes mellitus with diabetic polyneuropathy: Secondary | ICD-10-CM

## 2021-01-01 NOTE — Telephone Encounter (Signed)
Requested Prescriptions  Pending Prescriptions Disp Refills  . glucose blood (ACCU-CHEK AVIVA PLUS) test strip [Pharmacy Med Name: ACCU-CHEK AVIVA PLUS TEST STRIP 50S] 100 strip 3    Sig: USE AS DIRECTED TO TEST BLOOD SUGAR THREE TIMES DAILY WITH MEALS     Endocrinology: Diabetes - Testing Supplies Passed - 01/01/2021  8:03 AM      Passed - Valid encounter within last 12 months    Recent Outpatient Visits          4 months ago Type 2 diabetes mellitus with peripheral neuropathy [facility])   [facility] Marcine Matar, MD   8 months ago Establishing care with new doctor, encounter for   [facility] Marcine Matar, MD      Future Appointments            In 2 weeks [name], MD [facility] [place], [facility]   In 2 months Lennette Bihari, MD [facility] [place], [facility]

## 2021-01-03 ENCOUNTER — Other Ambulatory Visit (HOSPITAL_COMMUNITY): Payer: Self-pay

## 2021-01-17 ENCOUNTER — Telehealth: Payer: Self-pay | Admitting: Internal Medicine

## 2021-01-17 MED ORDER — JANUMET 50-1000 MG PO TABS: 1.0000 | ORAL_TABLET | Freq: Two times a day (BID) | ORAL | 3 refills | Status: DC

## 2021-01-17 NOTE — Telephone Encounter (Signed)
Pt calling in to request refill of sitaGLIPtin-metformin (JANUMET) 50-1000 MG tablet   To [facility] [phone] - [place], [place] - [phone] [facility] [address]  [address] [place] [address]   Pt contact [phone]

## 2021-01-17 NOTE — Telephone Encounter (Signed)
Script sent  

## 2021-01-18 ENCOUNTER — Other Ambulatory Visit: Payer: Self-pay | Admitting: Internal Medicine

## 2021-01-19 ENCOUNTER — Encounter: Payer: Self-pay | Admitting: Cardiology

## 2021-01-19 ENCOUNTER — Other Ambulatory Visit: Payer: Self-pay

## 2021-01-19 ENCOUNTER — Ambulatory Visit (INDEPENDENT_AMBULATORY_CARE_PROVIDER_SITE_OTHER): Payer: Medicare Other | Admitting: Cardiology

## 2021-01-19 VITALS — BP 96/58 | HR 74 | Ht 66.0 in | Wt 234.0 lb

## 2021-01-19 DIAGNOSIS — E782 Mixed hyperlipidemia: Secondary | ICD-10-CM | POA: Diagnosis not present

## 2021-01-19 DIAGNOSIS — Z01818 Encounter for other preprocedural examination: Secondary | ICD-10-CM

## 2021-01-19 DIAGNOSIS — I2510 Atherosclerotic heart disease of native coronary artery without angina pectoris: Secondary | ICD-10-CM | POA: Diagnosis not present

## 2021-01-19 DIAGNOSIS — I5042 Chronic combined systolic (congestive) and diastolic (congestive) heart failure: Secondary | ICD-10-CM

## 2021-01-19 DIAGNOSIS — I739 Peripheral vascular disease, unspecified: Secondary | ICD-10-CM

## 2021-01-19 LAB — LIPID PANEL
Chol/HDL Ratio: 3.9 ratio (ref 0.0–5.0)
Cholesterol, Total: 128 mg/dL (ref 100–199)
HDL: 33 mg/dL — ABNORMAL LOW (ref >39)
LDL Chol Calc (NIH): 62 mg/dL (ref 0–99)
Triglycerides: 199 mg/dL — ABNORMAL HIGH (ref 0–149)
VLDL Cholesterol Cal: 33 mg/dL (ref 5–40)

## 2021-01-19 LAB — BASIC METABOLIC PANEL
BUN/Creatinine Ratio: 17 (ref 10–24)
BUN: 18 mg/dL (ref 8–27)
CO2: 22 mmol/L (ref 20–29)
Calcium: 10 mg/dL (ref 8.6–10.2)
Chloride: 102 mmol/L (ref 96–106)
Creatinine, Ser: 1.08 mg/dL (ref 0.76–1.27)
Glucose: 187 mg/dL — ABNORMAL HIGH (ref 70–99)
Potassium: 4.6 mmol/L (ref 3.5–5.2)
Sodium: 138 mmol/L (ref 134–144)
eGFR: 77 mL/min/{1.73_m2} (ref >59)

## 2021-01-19 NOTE — Patient Instructions (Signed)
Medication Instructions:  STOP Plavix  *If you need a refill on your cardiac medications before your next appointment, please call your pharmacy*   Lab Work: BMET, Lipid today  If you have labs (blood work) drawn today and your tests are completely normal, you will receive your results only by: MyChart Message (if you have MyChart) OR A paper copy in the mail If you have any lab test that is abnormal or we need to change your treatment, we will call you to review the results.  Follow-Up: At [facility], you and your health needs are our priority.  As part of our continuing mission to provide you with exceptional heart care, we have created designated Provider Care Teams.  These Care Teams include your primary Cardiologist (physician) and Advanced Practice Providers (APPs -  Physician Assistants and Nurse Practitioners) who all work together to provide you with the care you need, when you need it.  We recommend signing up for the patient portal called "MyChart".  Sign up information is provided on this After Visit Summary.  MyChart is used to connect with patients for Virtual Visits (Telemedicine).  Patients are able to view lab/test results, encounter notes, upcoming appointments, etc.  Non-urgent messages can be sent to your provider as well.   To learn more about what you can do with MyChart, go to [email or web address].    Your next appointment:   3 months with PA or NP 6 months with Dr. [name]

## 2021-01-19 NOTE — Progress Notes (Signed)
Cardiology Office Note:    Date:  01/19/2021   ID:  Jon Jones, DOB [birth year], MRN [id number]  PCP:  Marcine Matar, MD  Cardiologist:  [name], MD  Electrophysiologist:  None   Referring MD: Marcine Matar, MD   Chief Complaint  Patient presents with   Follow-up    4 months.    CC: Follow-up  History of Present Illness:    Jon Jones is a 65 y.o. male with a hx of CAD status post IMI in 2016, ischemic cardiomyopathy (EF 35%) status post ICD, tobacco use, OSA, PAD, T2DM, hypertension, hyperlipidemia who presents for follow-up.  He was referred by Dr. Laural Benes for evaluation of CAD, initially seen on 05/21/2020.  He had an inferior MI in [place] in 2015.  No intervention.  Echocardiogram 02/2015 showed LVEF 35%.  He has been following at [facility].  Echo on 04/2016 showed LVEF 35%, no change from prior echo.  Underwent ICD implantation 09/2016.  Moved to [place] 4-5 years ago.  He previously had followed at [facility] but wishes to transfer his care here.  He denies any chest pain.  Reports dyspnea with going up and down stairs.  Reports occasional lightheadedness, denies any syncope.  Denies any lower extremity edema.  Reports rare palpitations.  Denies any ICD shocks.  He has been taking DAPT, denies any bleeding issues.  Echo on 06/01/2020 showed LVEF 35 to 40%, severe akinesis of basal to mid inferior wall, normal RV function, no significant valvular disease.  ABIs on 06/08/2020 showed were right 0.91, left 0.87.  Zio patch x3 days on 06/08/2020 showed 1 episode of NSVT lasting 5 beats, rare PVCs.  Since last clinic visit, reports that he is feeling well.  Denies any chest pain,  lightheadedness, syncope, or palpitations.  Does report some dyspnea with overexertion.  Reports occasional lower extremity edema.  Reports may be planning surgery for his spinal stenosis soon.  States that he can walk up 2 flights of stairs without stopping and denies any exertional symptoms with  this.  Wt Readings from Last 3 Encounters:  01/19/21 234 lb (106.1 kg)  12/27/20 239 lb (108.4 kg)  12/14/20 235 lb (106.6 kg)        Past Medical History:  Diagnosis Date   5 years ago    AICD (automatic cardioverter/defibrillator) present    Anxiety    Arthritis    Cataract    CHF (congestive heart failure) ([name])    Coronary artery disease    Diabetes mellitus without complication ([name])    Diabetic retinopathy ([name])    Dysrhythmia    GERD (gastroesophageal reflux disease)    PMH   Hypertension    Hypertensive retinopathy    Peripheral vascular disease ([name])    Peripheral vascular disease ([name])    Pneumonia    Shoulder impingement, right    Sleep apnea    wears cpap   Stomach ulcer    Wears glasses     Past Surgical History:  Procedure Laterality Date   AMPUTATION Left 03/12/2019   Procedure: 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;  Surgeon: [name], MD;  Location: [facility] OR;  Service: Orthopedics;  Laterality: Left;   AMPUTATION Right 10/01/2020   Procedure: RIGHT 2nd TOE AMPUTATION;  Surgeon: [name], DPM;  Location: [facility] OR;  Service: Podiatry;  Laterality: Right;   CARDIAC CATHETERIZATION     CARDIAC DEFIBRILLATOR PLACEMENT     CIRCUMCISION N/A 11/05/2020   Procedure: CIRCUMCISION ADULT;  Surgeon:  [name], MD;  Location: [facility] ORS;  Service: Urology;  Laterality: N/A;   I & D EXTREMITY Left 07/19/2016   Procedure: IRRIGATION AND DEBRIDEMENT EXTREMITY/OLECRANON(WASHOUT);  Surgeon: Tarry Kos, MD;  Location: [facility] OR;  Service: Orthopedics;  Laterality: Left;   OLECRANON BURSECTOMY Left 07/19/2016   Procedure: LEFT ELBOW OLECRANON BURSECTOMY;  Surgeon: Tarry Kos, MD;  Location: [facility];  Service: Orthopedics;  Laterality: Left;   SHOULDER ARTHROSCOPY Right 05/16/2019   Procedure: Right Shoulder Arthroscopy;  Surgeon: [name], MD;  Location: [facility] OR;  Service: Orthopedics;  Laterality: Right;    Current Medications: Current Meds  Medication Sig    Accu-Chek Softclix Lancets lancets CHECK BLOOD SUGAR ONCE DAILY   acetaminophen (TYLENOL) 325 MG tablet Take 2 tablets (650 mg total) by mouth every 6 (six) hours as needed for mild pain (or Fever >/= 101).   aspirin 81 MG EC tablet Take 81 mg by mouth daily.   atorvastatin (LIPITOR) 80 MG tablet Take 1 tablet (80 mg total) by mouth every evening.   ciclopirox (PENLAC) 8 % solution Apply topically at bedtime. Apply over nail and surrounding skin. Apply daily over previous coat. After seven (7) days, may remove with alcohol and continue cycle.   clobetasol ointment (TEMOVATE) 0.05 % Apply 1 application topically 2 (two) times daily as needed.   gabapentin (NEURONTIN) 300 MG capsule TAKE 1 CAPSULE(300 MG) BY MOUTH AT BEDTIME   glipiZIDE (GLUCOTROL) 10 MG tablet Take 2 tablets (20 mg total) by mouth 2 (two) times daily.   glucose blood (ACCU-CHEK AVIVA PLUS) test strip USE AS DIRECTED TO TEST BLOOD SUGAR THREE TIMES DAILY WITH MEALS   JARDIANCE 25 MG TABS tablet TAKE 1 TABLET(25 MG) BY MOUTH AT BEDTIME   metoprolol succinate (TOPROL-XL) 100 MG 24 hr tablet Take 1 tablet (100 mg total) by mouth daily.   Misc. Devices MISC Cpap supplies-tubing mask Dx G47.33 and Z99.89   nitroGLYCERIN (NITROSTAT) 0.4 MG SL tablet Place 0.4 mg under the tongue See admin instructions. Every 5 minutes as needed for chest pain. Max of 3 doses   PROAIR HFA 108 (90 Base) MCG/ACT inhaler INHALE 2 PUFFS INTO THE LUNGS EVERY 6 HOURS AS NEEDED FOR WHEEZING OR SHORTNESS OF BREATH   RESTASIS 0.05 % ophthalmic emulsion Place 1 drop into both eyes in the morning and at bedtime.   sacubitril-valsartan (ENTRESTO) 24-26 MG Take 1 tablet by mouth 2 (two) times daily.   sitaGLIPtin-metformin (JANUMET) 50-1000 MG tablet Take 1 tablet by mouth 2 (two) times daily with a meal.   spironolactone (ALDACTONE) 25 MG tablet Take 0.5 tablets (12.5 mg total) by mouth daily.   [DISCONTINUED] clopidogrel (PLAVIX) 75 MG tablet TAKE 1 TABLET BY  MOUTH EVERY DAY     Allergies:   Patient has no known allergies.   Social History   Socioeconomic History   Marital status: Married    Spouse name: Not on file   Number of children: Not on file   Years of education: Not on file   Highest education level: Not on file  Occupational History   Not on file  Tobacco Use   Smoking status: Former    Packs/day: 1.00    Years: 40.00    Pack years: 40.00    Types: Cigarettes    Quit date: 03/11/2019    Years since quitting: 1.8   Smokeless tobacco: Never  Vaping Use   Vaping Use: Never used  Substance and Sexual Activity  Alcohol use: Not Currently   Drug use: Not Currently    Types: Oxycodone   Sexual activity: Not on file  Other Topics Concern   Not on file  Social History Narrative   Not on file   Social Determinants of Health   Financial Resource Strain: Not on file  Food Insecurity: Not on file  Transportation Needs: Not on file  Physical Activity: Not on file  Stress: Not on file  Social Connections: Not on file     Family History: The patient's family history includes Diabetes in his mother; Heart disease in his mother; Peripheral vascular disease in his mother.  ROS:   Please see the history of present illness.    (+) Sciatic pain, radiates from buttocks to distal LE's All other systems reviewed and are negative.  EKGs/Labs/Other Studies Reviewed:    The following studies were reviewed today:  Monitor 06/08/2020: One episode of NSVT lasting 5 beats Rare PVCs (<1% of beats)   Patch Wear Time:  2 days and 22 hours (2022-03-17T01:26:33-0400 to 2022-03-20T00:25:48-0400)   Patient had a min HR of 59 bpm, max HR of 148 bpm, and avg HR of 77 bpm. Predominant underlying rhythm was Sinus Rhythm. 1 run of Ventricular Tachycardia occurred lasting 5 beats with a max rate of 148 bpm (avg 134 bpm). Episode of Ventricular Tachycardia may be Supraventricular Tachycardia with possible aberrancy. Isolated SVEs were rare  (<1.0%), SVE Triplets were rare (<1.0%), and no SVE Couplets were present. Isolated VEs were rare (<1.0%), VE Couplets were rare (<1.0%), and no VE Triplets were present.  No patient triggered events.  [place] ABI 06/08/2020: Summary:  Right: Resting right ankle-brachial index indicates mild right lower  extremity arterial disease.  The right toe-brachial index is abnormal.   Left: Resting left ankle-brachial index indicates mild left lower  extremity arterial disease.  The left toe-brachial index is abnormal.  Echo 06/01/2020: 1. Left ventricular ejection fraction, by estimation, is 35 to 40%. The  left ventricle has moderately decreased function. The left ventricle  demonstrates regional wall motion abnormalities (see scoring  diagram/findings for description). There is mild  left ventricular hypertrophy. Left ventricular diastolic parameters were  normal. There is severe akinesis of the left ventricular, basal-mid  inferior wall.   2. Right ventricular systolic function is normal. The right ventricular  size is normal.   3. The mitral valve is normal in structure. No evidence of mitral valve  regurgitation. No evidence of mitral stenosis.   4. The aortic valve is normal in structure. Aortic valve regurgitation is  not visualized.  EKG:   08/30/2020: EKG is not ordered today. 05/21/2020: sinus rhythm, rate 93, frequent PVCs Q waves in inferior leads  Recent Labs: 07/23/2020: TSH 1.88 09/29/2020: ALT 22 10/19/2020: BUN 20; Creatinine, Ser 0.96; Hemoglobin 13.2; Platelets 183; Potassium 4.5; Sodium 138  Recent Lipid Panel    Component Value Date/Time   CHOL 93 (L) 04/26/2020 1002   TRIG 289 (H) 04/26/2020 1002   HDL 25 (L) 04/26/2020 1002   CHOLHDL 3.7 04/26/2020 1002   LDLCALC 25 04/26/2020 1002    Physical Exam:    VS:  BP (!) 96/58 (BP Location: Left Arm, Patient Position: Sitting, Cuff Size: Large)   Pulse 74   Ht 5\' 6"  (1.676 m)   Wt 234 lb (106.1 kg)   BMI 37.77 kg/m      Wt Readings from Last 3 Encounters:  01/19/21 234 lb (106.1 kg)  12/27/20 239 lb (108.4 kg)  12/14/20 235 lb (106.6 kg)     GEN: Well nourished, well developed in no acute distress HEENT: Normal NECK: No JVD; No carotid bruits LYMPHATICS: No lymphadenopathy CARDIAC: RRR, no murmurs, rubs, gallops RESPIRATORY:  Clear to auscultation without rales, wheezing or rhonchi  ABDOMEN: Soft, non-tender, non-distended MUSCULOSKELETAL:  Trace edema; No deformity  SKIN: Warm and dry NEUROLOGIC:  Alert and oriented x 3 PSYCHIATRIC:  Normal affect   ASSESSMENT:    1. Pre-op evaluation   2. Coronary artery disease involving native coronary artery of native heart without angina pectoris   3. Chronic combined systolic and diastolic heart failure ([name])   4. Mixed hyperlipidemia   5. PAD (peripheral artery disease) ([name])      PLAN:     Preop evaluation: Prior to spinal surgery.  Good functional capacity, greater than 4 METS.  Reports can walk up to 50 stairs without any chest pain or dyspnea.  RCRI score 2 (CAD, CHF).  Overall would classify as intermediate risk for an intermediate risk surgery.  No further cardiac work-up recommended prior to surgery  CAD: Status post reported inferior MI in 2014 in [place], a had nuclear stress test in 2015 showing inferior infarct, no ischemia, EF 36%.  Cath was not done.  No anginal symptoms. -Continue aspirin 81 mg daily.  Will discontinue Plavix -Continue atorvastatin 80 mg daily -Continue Jardiance 25 mg daily -Continue Toprol-XL 100 mg daily -Sublingual nitroglycerin as needed -Smoking cessation strongly encouraged  Chronic combined systolic and diastolic heart failure: Ischemic cardiomyopathy.  EF 35% on echo in 2018.  Status post ICD.  Echo on 06/01/2020 showed LVEF 35 to 40%, severe akinesis of basal to mid inferior wall, normal RV function, no significant valvular disease.   -Continue Entresto 24-26 mg twice daily -Continue Toprol-XL 100 mg  daily -Continue Aldactone 12.5 mg daily -Continue Jardiance 25 mg daily -Check BMP -Follows in device clinic with Dr Royann Shivers  PVCs: Frequqent PVCs noted on EKG at initial visit.  Zio patch x3 days on 06/08/2020 showed 1 episode of NSVT lasting 5 beats, rare PVCs.  PAD: Continue aspirin, Plavix, statin.  ABIs on 06/08/2020 were right 0.91, left 0.87.  Repeat ABIs 12/27/2020 showed improvement, right 1.1, left 0.98.  Follows with vascular surgery  Hypertension: Continue Entresto, Toprol-XL, Aldactone.  Appears well controlled.    Hyperlipidemia: On atorvastatin 80 mg daily and Zetia 10 mg daily.  LDL 25 on 04/26/2020, discontinued Zetia.  We will recheck lipid panel  T2DM: A1c 10.1 on 04/26/2020.  Referred to endocrinology for management, A1c 8.7% 10/19/20  OSA: on CPAP, follows with Dr. [name].  RTC in 3 months  Medication Adjustments/Labs and Tests Ordered: Current medicines are reviewed at length with the patient today.  Concerns regarding medicines are outlined above.  Orders Placed This Encounter  Procedures   Basic metabolic panel   Lipid panel     No orders of the defined types were placed in this encounter.    Patient Instructions  Medication Instructions:  STOP Plavix  *If you need a refill on your cardiac medications before your next appointment, please call your pharmacy*   Lab Work: BMET, Lipid today  If you have labs (blood work) drawn today and your tests are completely normal, you will receive your results only by: MyChart Message (if you have MyChart) OR A paper copy in the mail If you have any lab test that is abnormal or we need to change your treatment, we will call you to review the  results.  Follow-Up: At [facility], you and your health needs are our priority.  As part of our continuing mission to provide you with exceptional heart care, we have created designated Provider Care Teams.  These Care Teams include your primary Cardiologist (physician) and  Advanced Practice Providers (APPs -  Physician Assistants and Nurse Practitioners) who all work together to provide you with the care you need, when you need it.  We recommend signing up for the patient portal called "MyChart".  Sign up information is provided on this After Visit Summary.  MyChart is used to connect with patients for Virtual Visits (Telemedicine).  Patients are able to view lab/test results, encounter notes, upcoming appointments, etc.  Non-urgent messages can be sent to your provider as well.   To learn more about what you can do with MyChart, go to [email or web address].    Your next appointment:   3 months with PA or NP 6 months with Dr. [name]      Signed, [name], MD  01/19/2021 9:38 AM    [facility]

## 2021-01-21 ENCOUNTER — Encounter: Payer: Self-pay | Admitting: *Deleted

## 2021-01-21 ENCOUNTER — Encounter: Payer: Self-pay | Admitting: Podiatry

## 2021-01-21 ENCOUNTER — Ambulatory Visit (INDEPENDENT_AMBULATORY_CARE_PROVIDER_SITE_OTHER): Payer: Medicare Other | Admitting: Podiatry

## 2021-01-21 ENCOUNTER — Other Ambulatory Visit: Payer: Self-pay

## 2021-01-21 DIAGNOSIS — I739 Peripheral vascular disease, unspecified: Secondary | ICD-10-CM

## 2021-01-21 DIAGNOSIS — B351 Tinea unguium: Secondary | ICD-10-CM | POA: Diagnosis not present

## 2021-01-21 DIAGNOSIS — E1159 Type 2 diabetes mellitus with other circulatory complications: Secondary | ICD-10-CM

## 2021-01-21 DIAGNOSIS — M79674 Pain in right toe(s): Secondary | ICD-10-CM

## 2021-01-21 DIAGNOSIS — M79675 Pain in left toe(s): Secondary | ICD-10-CM | POA: Diagnosis not present

## 2021-01-21 NOTE — Progress Notes (Signed)
This patient returns to my office for at risk foot care.  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  This patient presents for at risk foot care today.  General Appearance  Alert, conversant and in no acute stress.  Vascular  Dorsalis pedis and posterior tibial  pulses are palpable  bilaterally.  Capillary return is within normal limits  bilaterally. Temperature is within normal limits  bilaterally.  Neurologic  Senn-Weinstein monofilament wire test within normal limits  bilaterally. Muscle power within normal limits bilaterally.  Nails Thick disfigured discolored nails with subungual debris  from  1-4  toes left. and 1,3-5 toes right. . No evidence of bacterial infection or drainage bilaterally.  Orthopedic  No limitations of motion  feet .  No crepitus or effusions noted.  No bony pathology or digital deformities noted.  Skin  normotropic skin with no porokeratosis noted bilaterally.  No signs of infections or ulcers noted.     Onychomycosis  Pain in right toes  Pain in left toes  Consent was obtained for treatment procedures.   Mechanical debridement of nails 1-4 left and 1,3-5 right performed with a nail nipper.  Filed with dremel without incident.    Return office visit   3 months                   Told patient to return for periodic foot care and evaluation due to potential at risk complications.   Helane Gunther DPM

## 2021-01-22 ENCOUNTER — Other Ambulatory Visit: Payer: Self-pay | Admitting: Internal Medicine

## 2021-01-22 NOTE — Telephone Encounter (Signed)
Requested medications are due for refill today requesting inhaler early  Requested medications are on the active medication list yes  Last refill 12/25/20  Last visit 08/24/20  Future visit scheduled no  Notes to clinic requesting inhaler early, please assess. Requested Prescriptions  Pending Prescriptions Disp Refills   PROAIR HFA 108 (90 Base) MCG/ACT inhaler [Pharmacy Med Name: PROAIR HFA ORAL INH (200  PFS) 8.5G] 8.5 g 1    Sig: INHALE 2 PUFFS INTO THE LUNGS EVERY 6 HOURS AS NEEDED FOR WHEEZING OR SHORTNESS OF BREATH     Pulmonology:  Beta Agonists Failed - 01/22/2021  3:18 AM      Failed - One inhaler should last at least one month. If the patient is requesting refills earlier, contact the patient to check for uncontrolled symptoms.      Passed - Valid encounter within last 12 months    Recent Outpatient Visits           5 months ago Type 2 diabetes mellitus with peripheral neuropathy [facility])   [facility] Marcine Matar, MD   9 months ago Establishing care with new doctor, encounter for   [facility] Marcine Matar, MD       Future Appointments             In 1 month Lennette Bihari, MD [facility], [facility]   In 2 months Cleaver, Thomasene Ripple, NP [facility], [facility]

## 2021-01-29 ENCOUNTER — Other Ambulatory Visit: Payer: Self-pay | Admitting: Internal Medicine

## 2021-02-02 ENCOUNTER — Telehealth: Payer: Self-pay

## 2021-02-02 ENCOUNTER — Other Ambulatory Visit (HOSPITAL_COMMUNITY): Payer: Self-pay

## 2021-02-02 NOTE — Telephone Encounter (Signed)
Can we find out if a PA is needed for test strips or if we just need to change to 90 instead of 100 to go through

## 2021-02-02 NOTE — Telephone Encounter (Signed)
When I submit a test claim with [employer] it says filled after coverage term. So unable to see if it needs a PA. However with this being test strips it may go through his Part B at the pharmacy. It comes in increments of #50

## 2021-02-08 ENCOUNTER — Telehealth: Payer: Self-pay | Admitting: Cardiology

## 2021-02-08 ENCOUNTER — Telehealth: Payer: Self-pay

## 2021-02-08 ENCOUNTER — Telehealth: Payer: Self-pay | Admitting: Internal Medicine

## 2021-02-08 MED ORDER — ATORVASTATIN CALCIUM 80 MG PO TABS: 80.0000 mg | ORAL_TABLET | Freq: Every evening | ORAL | 3 refills | Status: DC

## 2021-02-08 MED ORDER — METOPROLOL SUCCINATE ER 100 MG PO TB24: 100.0000 mg | ORAL_TABLET | Freq: Every day | ORAL | 3 refills | Status: DC

## 2021-02-08 NOTE — Telephone Encounter (Signed)
Copied from CRM [id number]. Topic: Quick Communication - Rx Refill/Question >> Feb 08, 2021 10:58 AM Gaetana Michaelis A wrote: Medication: lisinopril (ZESTRIL) 10 MG tablet [[id number]]  Has the patient contacted their pharmacy? Yes.   (Agent: If no, request that the patient contact the pharmacy for the refill. If patient does not wish to contact the pharmacy document the reason why and proceed with request.) (Agent: If yes, when and what did the pharmacy advise?)  Preferred Pharmacy (with phone number or street name): [facility] [phone] - [place], [place] - [address] AT [address] [address]  Phone:  [phone] Fax:  [phone]  Has the patient been seen for an appointment in the last year OR does the patient have an upcoming appointment? Yes.    Agent: Please be advised that RX refills may take up to 3 business days. We ask that you follow-up with your pharmacy.

## 2021-02-08 NOTE — Telephone Encounter (Signed)
Spoke with [employer] and they will fax over the form that we need to fill out. Also spoke with patient and advised that pharmacy will be sending paperwork over.

## 2021-02-08 NOTE — Telephone Encounter (Signed)
Contacted pt to go over provider response pt didn't answer left a detailed vm making pt aware

## 2021-02-08 NOTE — Telephone Encounter (Signed)
Will forward to provider  

## 2021-02-08 NOTE — Telephone Encounter (Signed)
*  STAT* If patient is at the pharmacy, call can be transferred to refill team.   1. Which medications need to be refilled? (please list name of each medication and dose if known)  atorvastatin (LIPITOR) 80 MG tablet;   metoprolol succinate (TOPROL-XL) 100 MG 24 hr tablet    2. Which pharmacy/location (including street and city if local pharmacy) is medication to be sent to?  [facility] [phone] - [place], [place] - [address]  3. Do they need a 30 day or 90 day supply? 90

## 2021-02-08 NOTE — Telephone Encounter (Signed)
Rx(s) sent to pharmacy electronically.  

## 2021-02-08 NOTE — Telephone Encounter (Signed)
See encounter

## 2021-02-08 NOTE — Telephone Encounter (Signed)
Mr. Hostetler is needing a letter or documentation for  his additional test strips.  Please call him back ASAP for more details. He is a bit upset with the phone communication system. +  Thank you Call back phone [phone].

## 2021-02-08 NOTE — Telephone Encounter (Signed)
Lisinopril is not on medication list.

## 2021-02-09 ENCOUNTER — Other Ambulatory Visit: Payer: Self-pay | Admitting: Cardiology

## 2021-02-09 NOTE — Telephone Encounter (Signed)
Spoke with [employer] this morning as we have not received the fax yet. They will refax this morning again. I confirmed fax number

## 2021-02-09 NOTE — Telephone Encounter (Signed)
This is Dr. [name]'s pt 

## 2021-02-09 NOTE — Telephone Encounter (Signed)
Form received and faxed back to [facility]

## 2021-02-09 NOTE — Telephone Encounter (Signed)
Patient was very upset about the hold times on the phones. I apologize to patient and let him know that mychart maybe a quicker way to get in touch with [place].

## 2021-02-10 ENCOUNTER — Other Ambulatory Visit: Payer: Self-pay

## 2021-02-10 ENCOUNTER — Ambulatory Visit (HOSPITAL_BASED_OUTPATIENT_CLINIC_OR_DEPARTMENT_OTHER): Payer: Medicare Other | Attending: Cardiovascular Disease | Admitting: Cardiovascular Disease

## 2021-02-10 DIAGNOSIS — E669 Obesity, unspecified: Secondary | ICD-10-CM | POA: Insufficient documentation

## 2021-02-10 DIAGNOSIS — G4733 Obstructive sleep apnea (adult) (pediatric): Secondary | ICD-10-CM | POA: Insufficient documentation

## 2021-02-10 DIAGNOSIS — I5042 Chronic combined systolic (congestive) and diastolic (congestive) heart failure: Secondary | ICD-10-CM | POA: Insufficient documentation

## 2021-02-10 DIAGNOSIS — Z9989 Dependence on other enabling machines and devices: Secondary | ICD-10-CM | POA: Diagnosis not present

## 2021-02-10 DIAGNOSIS — R0683 Snoring: Secondary | ICD-10-CM | POA: Insufficient documentation

## 2021-02-12 ENCOUNTER — Other Ambulatory Visit: Payer: Self-pay | Admitting: Internal Medicine

## 2021-02-12 DIAGNOSIS — E1142 Type 2 diabetes mellitus with diabetic polyneuropathy: Secondary | ICD-10-CM

## 2021-02-15 ENCOUNTER — Telehealth: Payer: Self-pay | Admitting: Internal Medicine

## 2021-02-15 NOTE — Telephone Encounter (Signed)
Pt stated medical necessity is needed, please contact [facility] at [phone]. This request is for pt to receive 2 boxes on glucose blood test strip 50 count. Providers npi is needed upon contacting pharmacy. Please advise.

## 2021-02-15 NOTE — Telephone Encounter (Signed)
Pt states he has sent several messages concerning his test strips. Pt states he is supposed to check his sugar 3 X a day.  He is now on Medicare, and they are requiring a certificate of medical necessity  In order to get his 100 test strips / month..  Please submit so he can get his supply.of test strips.  [facility] [phone] - [place], [place] - [address] AT [facility] [address]

## 2021-02-15 NOTE — Telephone Encounter (Signed)
Formed was faxed on the 29th to [place]. We will fax again and callback to make sure they receive it .

## 2021-02-16 ENCOUNTER — Telehealth: Payer: Self-pay | Admitting: Internal Medicine

## 2021-02-16 ENCOUNTER — Other Ambulatory Visit: Payer: Self-pay | Admitting: Cardiovascular Disease

## 2021-02-16 NOTE — Telephone Encounter (Signed)
Pt needs medical necessity form filled out so he can have his glucose test strips covered since he has to check his blood sugar 3 times a day.

## 2021-02-16 NOTE — Telephone Encounter (Signed)
Pt returned call, plans to come inside office tomorrow to retrieve his form for medical necessity

## 2021-02-17 ENCOUNTER — Other Ambulatory Visit: Payer: Self-pay | Admitting: Internal Medicine

## 2021-02-17 NOTE — Telephone Encounter (Signed)
Paperwork has been faxed again this morning from Dr. Lonzo Cloud office. Will call at 9am and make sure they have received it

## 2021-02-17 NOTE — Progress Notes (Shared)
[facility] - Clinic Note  03/01/2021     CHIEF COMPLAINT Patient presents for No chief complaint on file.   HISTORY OF PRESENT ILLNESS: Jon Jones is a 65 y.o. male who presents to the clinic today for:        Referring physician: Alma Downs, PA-C Marcine Matar, MD [address] [place],  [place] [address]  HISTORICAL INFORMATION:   Selected notes from the MEDICAL RECORD NUMBER Referred by Alma Downs, PA for eval of diabetic retinopathy OU; DME OS   CURRENT MEDICATIONS: Current Outpatient Medications (Ophthalmic Drugs)  Medication Sig   RESTASIS 0.05 % ophthalmic emulsion Place 1 drop into both eyes in the morning and at bedtime.   No current facility-administered medications for this visit. (Ophthalmic Drugs)   Current Outpatient Medications (Other)  Medication Sig   Accu-Chek Softclix Lancets lancets CHECK BLOOD SUGAR ONCE DAILY   acetaminophen (TYLENOL) 325 MG tablet Take 2 tablets (650 mg total) by mouth every 6 (six) hours as needed for mild pain (or Fever >/= 101).   aspirin 81 MG EC tablet Take 81 mg by mouth daily.   atorvastatin (LIPITOR) 80 MG tablet Take 1 tablet (80 mg total) by mouth every evening.   ciclopirox (PENLAC) 8 % solution Apply topically at bedtime. Apply over nail and surrounding skin. Apply daily over previous coat. After seven (7) days, may remove with alcohol and continue cycle.   clobetasol ointment (TEMOVATE) 0.05 % Apply 1 application topically 2 (two) times daily as needed.   clopidogrel (PLAVIX) 75 MG tablet TAKE 1 TABLET BY MOUTH EVERY DAY   gabapentin (NEURONTIN) 300 MG capsule TAKE 1 CAPSULE(300 MG) BY MOUTH AT BEDTIME   glipiZIDE (GLUCOTROL) 10 MG tablet TAKE 2 TABLETS(20 MG) BY MOUTH TWICE DAILY   glucose blood (ACCU-CHEK AVIVA PLUS) test strip TEST 3 TIMES DAILY   JARDIANCE 25 MG TABS tablet TAKE 1 TABLET(25 MG) BY MOUTH AT BEDTIME   lisinopril (ZESTRIL) 10 MG tablet TAKE 1 TABLET BY MOUTH  ONCE DAILY   metoprolol succinate (TOPROL-XL) 100 MG 24 hr tablet Take 1 tablet (100 mg total) by mouth daily.   Misc. Devices MISC Cpap supplies-tubing mask Dx G47.33 and Z99.89   nitroGLYCERIN (NITROSTAT) 0.4 MG SL tablet Place 0.4 mg under the tongue See admin instructions. Every 5 minutes as needed for chest pain. Max of 3 doses   sacubitril-valsartan (ENTRESTO) 24-26 MG Take 1 tablet by mouth 2 (two) times daily.   sitaGLIPtin-metformin (JANUMET) 50-1000 MG tablet Take 1 tablet by mouth 2 (two) times daily with a meal.   spironolactone (ALDACTONE) 25 MG tablet Take 0.5 tablets (12.5 mg total) by mouth daily.   VENTOLIN HFA 108 (90 Base) MCG/ACT inhaler INHALE 2 PUFFS INTO THE LUNGS EVERY 6 HOURS AS NEEDED FOR WHEEZING OR SHORTNESS OF BREATH   No current facility-administered medications for this visit. (Other)      REVIEW OF SYSTEMS:      ALLERGIES No Known Allergies  PAST MEDICAL HISTORY Past Medical History:  Diagnosis Date   5 years ago    AICD (automatic cardioverter/defibrillator) present    Anxiety    Arthritis    Cataract    CHF (congestive heart failure) ([name])    Coronary artery disease    Diabetes mellitus without complication ([name])    Diabetic retinopathy ([name])    Dysrhythmia    GERD (gastroesophageal reflux disease)    PMH   Hypertension    Hypertensive retinopathy  Peripheral vascular disease ([name])    Peripheral vascular disease ([name])    Pneumonia    Shoulder impingement, right    Sleep apnea    wears cpap   Stomach ulcer    Wears glasses    Past Surgical History:  Procedure Laterality Date   AMPUTATION Left 03/12/2019   Procedure: 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;  Surgeon: [name], MD;  Location: [facility] OR;  Service: Orthopedics;  Laterality: Left;   AMPUTATION Right 10/01/2020   Procedure: RIGHT 2nd TOE AMPUTATION;  Surgeon: [name], DPM;  Location: [facility] OR;  Service: Podiatry;  Laterality: Right;   CARDIAC CATHETERIZATION     CARDIAC  DEFIBRILLATOR PLACEMENT     CIRCUMCISION N/A 11/05/2020   Procedure: CIRCUMCISION ADULT;  Surgeon: [name], MD;  Location: [facility] ORS;  Service: Urology;  Laterality: N/A;   I & D EXTREMITY Left 07/19/2016   Procedure: IRRIGATION AND DEBRIDEMENT EXTREMITY/OLECRANON(WASHOUT);  Surgeon: Tarry Kos, MD;  Location: [facility] OR;  Service: Orthopedics;  Laterality: Left;   OLECRANON BURSECTOMY Left 07/19/2016   Procedure: LEFT ELBOW OLECRANON BURSECTOMY;  Surgeon: Tarry Kos, MD;  Location: [facility];  Service: Orthopedics;  Laterality: Left;   SHOULDER ARTHROSCOPY Right 05/16/2019   Procedure: Right Shoulder Arthroscopy;  Surgeon: [name], MD;  Location: [facility] OR;  Service: Orthopedics;  Laterality: Right;    FAMILY HISTORY Family History  Problem Relation Age of Onset   Heart disease Mother    Diabetes Mother    Peripheral vascular disease Mother     SOCIAL HISTORY Social History   Tobacco Use   Smoking status: Former    Packs/day: 1.00    Years: 40.00    Pack years: 40.00    Types: Cigarettes    Quit date: 03/11/2019    Years since quitting: 1.9   Smokeless tobacco: Never  Vaping Use   Vaping Use: Never used  Substance Use Topics   Alcohol use: Not Currently   Drug use: Not Currently    Types: Oxycodone         OPHTHALMIC EXAM:  Not recorded     IMAGING AND PROCEDURES  Imaging and Procedures for 03/01/2021          ASSESSMENT/PLAN:  No diagnosis found.   1,2. Moderate nonproliferative diabetic retinopathy OU  OD: no DME  OS: mild cystic changes / IRHM nasal macula  - last A1c in Epic is 8.7 on 08.09.22 - pt working with Endodrinologist and blood glucose improving  - exam shows scattered MA OU - FA (06.21.22) shows late leaking MA, no NV OU - OCT without diabetic macular edema OD; OS w/ focal cystic changes and IRHM nasal fovea  - no retinal intervention recommended at this time -- advised continued optimization of DM2 and HTN - f/u in  3-4 mos, sooner -- DFE/OCT  3,4. Hypertensive retinopathy OU - discussed importance of tight BP control - monitor  5. Mixed Cataract OU - The symptoms of cataract, surgical options, and treatments and risks were discussed with patient. - discussed diagnosis and progression - monitor for now   Ophthalmic Meds Ordered this visit:  No orders of the defined types were placed in this encounter.      No follow-ups on file.  There are no Patient Instructions on file for this visit.   Explained the diagnoses, plan, and follow up with the patient and they expressed understanding.  Patient expressed understanding of the importance of proper follow up care.  This document serves as a record of services personally performed by Karie Chimera, MD, PhD. It was created on their behalf by Cristopher Estimable, COT an ophthalmic technician. The creation of this record is the provider's dictation and/or activities during the visit.    Electronically signed by: Cristopher Estimable, COT 12.8.22 @ 12:45 PM   Karie Chimera, M.D., Ph.D. Diseases & Surgery of the Retina and Vitreous [facility]     Abbreviations: M myopia (nearsighted); A astigmatism; H hyperopia (farsighted); P presbyopia; Mrx spectacle prescription;  CTL contact lenses; OD right eye; OS left eye; OU both eyes  XT exotropia; ET esotropia; PEK punctate epithelial keratitis; PEE punctate epithelial erosions; DES dry eye syndrome; MGD meibomian gland dysfunction; ATs artificial tears; PFAT's preservative free artificial tears; NSC nuclear sclerotic cataract; PSC posterior subcapsular cataract; ERM epi-retinal membrane; PVD posterior vitreous detachment; RD retinal detachment; DM diabetes mellitus; DR diabetic retinopathy; NPDR non-proliferative diabetic retinopathy; PDR proliferative diabetic retinopathy; CSME clinically significant macular edema; DME diabetic macular edema; dbh dot blot hemorrhages; CWS cotton wool spot;  POAG primary open angle glaucoma; C/D cup-to-disc ratio; HVF humphrey visual field; GVF goldmann visual field; OCT optical coherence tomography; IOP intraocular pressure; BRVO Branch retinal vein occlusion; CRVO central retinal vein occlusion; CRAO central retinal artery occlusion; BRAO branch retinal artery occlusion; RT retinal tear; SB scleral buckle; PPV pars plana vitrectomy; VH Vitreous hemorrhage; PRP panretinal laser photocoagulation; IVK intravitreal kenalog; VMT vitreomacular traction; MH Macular hole;  NVD neovascularization of the disc; NVE neovascularization elsewhere; AREDS age related eye disease study; ARMD age related macular degeneration; POAG primary open angle glaucoma; EBMD epithelial/anterior basement membrane dystrophy; ACIOL anterior chamber intraocular lens; IOL intraocular lens; PCIOL posterior chamber intraocular lens; Phaco/IOL phacoemulsification with intraocular lens placement; PRK photorefractive keratectomy; LASIK laser assisted in situ keratomileusis; HTN hypertension; DM diabetes mellitus; COPD chronic obstructive pulmonary disease

## 2021-02-17 NOTE — Telephone Encounter (Signed)
Forms have been faxed with correct testing information. Patient is aware

## 2021-02-17 NOTE — Telephone Encounter (Signed)
Will forward to correct nurse  

## 2021-02-17 NOTE — Telephone Encounter (Signed)
Per [employer] medicare the test strips have been approved and patient is aware that he can contact pharmacy

## 2021-02-17 NOTE — Telephone Encounter (Signed)
No form has been faxed to [place]. Called and requested from pharmacy. Once this is faxed to [place], it will need to be signed by PCP.

## 2021-02-17 NOTE — Telephone Encounter (Signed)
Spoke with patient this morning and advise I will contact [employer] as soon as they open to make sure they have form.

## 2021-02-17 NOTE — Telephone Encounter (Signed)
Requested Prescriptions  Pending Prescriptions Disp Refills  . VENTOLIN HFA 108 (90 Base) MCG/ACT inhaler [Pharmacy Med Name: VENTOLIN HFA INH W/DOS CTR 200PUFFS] 18 g 1    Sig: INHALE 2 PUFFS INTO THE LUNGS EVERY 6 HOURS AS NEEDED FOR WHEEZING OR SHORTNESS OF BREATH     Pulmonology:  Beta Agonists Failed - 02/17/2021  3:18 AM      Failed - One inhaler should last at least one month. If the patient is requesting refills earlier, contact the patient to check for uncontrolled symptoms.      Passed - Valid encounter within last 12 months    Recent Outpatient Visits          5 months ago Type 2 diabetes mellitus with peripheral neuropathy [facility])   [facility] Marcine Matar, MD   9 months ago Establishing care with new doctor, encounter for   [facility] Marcine Matar, MD      Future Appointments            In 1 month Lennette Bihari, MD [facility] [place], [facility]   In 2 months Cleaver, Thomasene Ripple, NP [facility], [facility]

## 2021-02-25 ENCOUNTER — Encounter (HOSPITAL_BASED_OUTPATIENT_CLINIC_OR_DEPARTMENT_OTHER): Payer: Self-pay | Admitting: Cardiovascular Disease

## 2021-02-25 NOTE — Procedures (Signed)
Patient Name: Jon Jones, Jon Jones Date: 02/10/2021 Gender: Male D.O.B: 12-23-1955 Age (years): 49 Referring Provider: Shelva Majestic MD, ABSM Height (inches): 44 Interpreting Physician: Shelva Majestic MD, ABSM Weight (lbs): 230 RPSGT: Baxter Flattery BMI: 37 MRN: [id number] Neck Size: 18.00  CLINICAL INFORMATION Sleep Study Type: Split Night CPAP  Indication for sleep study: Obesity, OSA, Snoring, Witnessed Apneas  Epworth Sleepiness Score: 3  SLEEP STUDY TECHNIQUE As per the AASM Manual for the Scoring of Sleep and Associated Events v2.3 (April 2016) with a hypopnea requiring 4% desaturations.  The channels recorded and monitored were frontal, central and occipital EEG, electrooculogram (EOG), submentalis EMG (chin), nasal and oral airflow, thoracic and abdominal wall motion, anterior tibialis EMG, snore microphone, electrocardiogram, and pulse oximetry. Continuous positive airway pressure (CPAP) was initiated when the patient met split night criteria and was titrated according to treat sleep-disordered breathing.  MEDICATIONS acetaminophen (TYLENOL) 325 MG tablet aspirin 81 MG EC tablet atorvastatin (LIPITOR) 80 MG tablet ciclopirox (PENLAC) 8 % solution clobetasol ointment (TEMOVATE) 0.05 % clopidogrel (PLAVIX) 75 MG tablet gabapentin (NEURONTIN) 300 MG capsule glipiZIDE (GLUCOTROL) 10 MG tablet glucose blood (ACCU-CHEK AVIVA PLUS) test strip JARDIANCE 25 MG TABS tablet lisinopril (ZESTRIL) 10 MG tablet metoprolol succinate (TOPROL-XL) 100 MG 24 hr tablet Misc. Devices MISC nitroGLYCERIN (NITROSTAT) 0.4 MG SL tablet RESTASIS 0.05 % ophthalmic emulsion sacubitril-valsartan (ENTRESTO) 24-26 MG sitaGLIPtin-metformin (JANUMET) 50-1000 MG tablet spironolactone (ALDACTONE) 25 MG tablet VENTOLIN HFA 108 (90 Base) MCG/ACT inhaler Medications self-administered by patient taken the night of the study : N/A  RESPIRATORY PARAMETERS Diagnostic Total AHI (/hr): 31.8 RDI  (/hr): 32.4 OA Index (/hr): 20.8 CA Index (/hr): 0.6 REM AHI (/hr): 60.0 NREM AHI (/hr): 29.5 Supine AHI (/hr): 5.9 Non-supine AHI (/hr): 38.4 Min O2 Sat (%): 67.0 Mean O2 (%): 93.6 Time below 88% (min): 10.3  Titration Optimal Pressure (cm): 12 AHI at Optimal Pressure (/hr): 0 Min O2 at Optimal Pressure (%): 92.0 Supine % at Optimal (%): 100 Sleep % at Optimal (%): 100   SLEEP ARCHITECTURE The recording time for the entire night was 375.7 minutes.  During a baseline period of 224.2 minutes, the patient slept for 202.0 minutes in REM and nonREM, yielding a sleep efficiency of 90.1%%. Sleep onset after lights out was 5.6 minutes with a REM latency of 70.5 minutes. The patient spent 2.2%% of the night in stage N1 sleep, 90.3%% in stage N2 sleep, 0.0%% in stage N3 and 7.4% in REM.  During the titration period of 144.0 minutes, the patient slept for 114.6 minutes in REM and nonREM, yielding a sleep efficiency of 79.6%%. Sleep onset after CPAP initiation was 22.4 minutes with a REM latency of 68.0 minutes. The patient spent 5.7%% of the night in stage N1 sleep, 83.0%% in stage N2 sleep, 0.0%% in stage N3 and 11.3% in REM.  CARDIAC DATA The 2 lead EKG demonstrated sinus rhythm, pacemaker generated. The mean heart rate was 100.0 beats per minute. Other EKG findings include: sinus rhythm  LEG MOVEMENT DATA The total Periodic Limb Movements of Sleep (PLMS) were 0. The PLMS index was 0.0 .  IMPRESSIONS - Severe obstructive sleep apnea occurred during the diagnostic portion of the study (AHI 31.8/h; RDI 32.4/h); however, sleep apnea was severe during REM sleep (AHI 60.0/h).  CPAP was initiated at 5 cm and was titrated to optimal PAP pressure at 12 cm of water (AHI 0; O2 nadir 92%).  - No significant central sleep apnea occurred during the diagnostic portion of  the study (CAI = 0.6/hour). - The patient had severe oxygen desaturation during the diagnostic portion of the study to a nadir of 67.0%. - Loud  snoring was present during the diagnostic portion of the study. - EKG findings: sinus rhythm  - Clinically significant periodic limb movements did not occur during sleep.  DIAGNOSIS - Obstructive Sleep Apnea (G47.33)  RECOMMENDATIONS - Recommend an initial trial of CPAP therapy with EPR at 12 cm H2O with heated humidification. A Medium size Fisher&Paykel Full Face Mask Simplus mask was used for the titration. - Effort should be made to optimize nasal and oropharyngeal patency. - Avoid alcohol, sedatives and other CNS depressants that may worsen sleep apnea and disrupt normal sleep architecture. - Sleep hygiene should be reviewed to assess factors that may improve sleep quality. - Weight management and regular exercise should be initiated or continued. - Recommend a download in 30 days and sleep clinic evaluation after 4 weeks of therapy.  [Electronically signed] 02/25/2021 11:55 AM  Shelva Majestic MD, [facility], [place], American Board of Sleep Medicine   NPI: [id number]  [place] PH: [phone]   FX: [phone] [place]

## 2021-02-28 ENCOUNTER — Telehealth: Payer: Self-pay | Admitting: *Deleted

## 2021-02-28 NOTE — Telephone Encounter (Signed)
Patient notified of sleep results. Informed CPAP order has been sent to [facility] as a urgent process. Questions was answered about the difference between his old machine and the new one being ordered.

## 2021-02-28 NOTE — Telephone Encounter (Signed)
-----   Message from Lennette Bihari, MD sent at 02/25/2021 12:04 PM EST ----- Burna Mortimer please notify pt and set up with local DME . I believe he has an old machine from [place].

## 2021-03-01 ENCOUNTER — Encounter (INDEPENDENT_AMBULATORY_CARE_PROVIDER_SITE_OTHER): Payer: Medicare Other | Admitting: Ophthalmology

## 2021-03-01 DIAGNOSIS — I10 Essential (primary) hypertension: Secondary | ICD-10-CM

## 2021-03-01 DIAGNOSIS — H25813 Combined forms of age-related cataract, bilateral: Secondary | ICD-10-CM

## 2021-03-01 DIAGNOSIS — E113313 Type 2 diabetes mellitus with moderate nonproliferative diabetic retinopathy with macular edema, bilateral: Secondary | ICD-10-CM

## 2021-03-01 DIAGNOSIS — H3581 Retinal edema: Secondary | ICD-10-CM

## 2021-03-01 DIAGNOSIS — H35033 Hypertensive retinopathy, bilateral: Secondary | ICD-10-CM

## 2021-03-04 ENCOUNTER — Telehealth: Payer: Self-pay

## 2021-03-04 NOTE — Telephone Encounter (Signed)
Dr. [name] called and spoke with patient. Confirmed patient is not taking Lisinopril. Advised patient to remain off of this medication. Patient to continue Entresto 24-26 BID. Patient educated regarding this and voiced understanding. Patient medication list has been updated.   Can proceed with Prior Auth for [facility].   Will forward to Prior Auth nurse for help with this.

## 2021-03-04 NOTE — Telephone Encounter (Signed)
Received letter stating that patient needs renewal Prior Auth for [facility]. Upon chart review patient currently has Lisinopril and Entresto Listed as active medications. Attempted to call patient to confirm what medications he is currently taking but unable to reach patient. Left message for patient to call back when able.

## 2021-03-08 ENCOUNTER — Ambulatory Visit (INDEPENDENT_AMBULATORY_CARE_PROVIDER_SITE_OTHER): Payer: Medicare Other

## 2021-03-08 DIAGNOSIS — I5042 Chronic combined systolic (congestive) and diastolic (congestive) heart failure: Secondary | ICD-10-CM

## 2021-03-08 LAB — CUP PACEART REMOTE DEVICE CHECK
Battery Remaining Longevity: 76 mo
Battery Voltage: 2.99 V
Brady Statistic AP VP Percent: 0 %
Brady Statistic AP VS Percent: 1.56 %
Brady Statistic AS VP Percent: 0.03 %
Brady Statistic AS VS Percent: 98.41 %
Brady Statistic RA Percent Paced: 1.56 %
Brady Statistic RV Percent Paced: 0.03 %
Date Time Interrogation Session: 20221226042507
HighPow Impedance: 77 Ohm
Implantable Lead Implant Date: 20180702
Implantable Lead Implant Date: 20180702
Implantable Lead Location: 753859
Implantable Lead Location: 753860
Implantable Lead Model: 5076
Implantable Pulse Generator Implant Date: 20180702
Lead Channel Impedance Value: 399 Ohm
Lead Channel Impedance Value: 437 Ohm
Lead Channel Impedance Value: 513 Ohm
Lead Channel Pacing Threshold Amplitude: 0.5 V
Lead Channel Pacing Threshold Amplitude: 0.5 V
Lead Channel Pacing Threshold Pulse Width: 0.4 ms
Lead Channel Pacing Threshold Pulse Width: 0.4 ms
Lead Channel Sensing Intrinsic Amplitude: 2.875 mV
Lead Channel Sensing Intrinsic Amplitude: 2.875 mV
Lead Channel Sensing Intrinsic Amplitude: 6.625 mV
Lead Channel Sensing Intrinsic Amplitude: 6.625 mV
Lead Channel Setting Pacing Amplitude: 1.5 V
Lead Channel Setting Pacing Amplitude: 2 V
Lead Channel Setting Pacing Pulse Width: 0.4 ms
Lead Channel Setting Sensing Sensitivity: 0.3 mV

## 2021-03-09 ENCOUNTER — Encounter: Payer: Self-pay | Admitting: Cardiology

## 2021-03-09 NOTE — Telephone Encounter (Signed)
Error. Duplicate encounter.

## 2021-03-09 NOTE — Telephone Encounter (Signed)
Patient was calling to check the status of his prior authorization. The patient got another letter from his insurance company

## 2021-03-10 NOTE — Telephone Encounter (Signed)
**Note De-Identified Christyanna Mckeon Obfuscation** Entresto PA started through covermymeds. Message received: Norberta Keens Key: [id number] Outcome: PA was already submitted for this patient and drug which was denied.;[id number];NFAOZH:[id number] Appeal Information: Attention:ATTN: MEDICARE CLINICAL [employer] [id number]. [phone] [phone] Fax:[phone] WebAddress:[email or web address]; Drug Entresto 24-26MG  tablets Form [employer] Electronic PA Form [phone] NCPDP)  I called [employer] and was advised by Fantaijai B. That this claim is incorrectly denying as [name] is covered by the pts plan as it is a formulary medication. She states that we should have received a message that the pt requested a refill too soon and that it is an error on their part.  I called the pt to make him aware of this but got no answer. I did leave a message on his VM (ok per DPR) letting him know that his ins plan covers his [name] so a PA is not required but that he is requesting a refill to soon and that he can get his next refill on 03/16/2021. I also left my name and office phone number in the voice message so he can call back if he has any questions.

## 2021-03-10 NOTE — Telephone Encounter (Signed)
**Note De-Identified Jon Jones Obfuscation** I called [facility] to get the pts ins. Info from card they are running his [name] under and was advised by the pharmacist that the pt is requesting a refill to soon as he picked up his last refill for a 30 day supply on 02/28/21 and it cannot be refilled again until 03/16/2021.  The pharmacist did provide the following info so I can attempt this PA as both Dr Astrid Divine nurse and the pt state they received a letter requesting that a Entresto PA needs to be done: [id number] BIN: [id number] PCN: [id number] GRP: [id number]

## 2021-03-18 NOTE — Progress Notes (Signed)
Remote ICD transmission.   

## 2021-03-21 ENCOUNTER — Other Ambulatory Visit: Payer: Self-pay | Admitting: Internal Medicine

## 2021-03-21 ENCOUNTER — Telehealth: Payer: Self-pay | Admitting: Internal Medicine

## 2021-03-21 ENCOUNTER — Ambulatory Visit (INDEPENDENT_AMBULATORY_CARE_PROVIDER_SITE_OTHER): Payer: Medicare Other | Admitting: Cardiovascular Disease

## 2021-03-21 ENCOUNTER — Encounter: Payer: Self-pay | Admitting: Cardiovascular Disease

## 2021-03-21 ENCOUNTER — Other Ambulatory Visit: Payer: Self-pay

## 2021-03-21 VITALS — BP 120/70 | HR 76 | Ht 66.0 in | Wt 234.0 lb

## 2021-03-21 DIAGNOSIS — Z95810 Presence of automatic (implantable) cardiac defibrillator: Secondary | ICD-10-CM | POA: Diagnosis not present

## 2021-03-21 DIAGNOSIS — E1142 Type 2 diabetes mellitus with diabetic polyneuropathy: Secondary | ICD-10-CM

## 2021-03-21 DIAGNOSIS — I2510 Atherosclerotic heart disease of native coronary artery without angina pectoris: Secondary | ICD-10-CM

## 2021-03-21 DIAGNOSIS — I5042 Chronic combined systolic (congestive) and diastolic (congestive) heart failure: Secondary | ICD-10-CM

## 2021-03-21 DIAGNOSIS — G4733 Obstructive sleep apnea (adult) (pediatric): Secondary | ICD-10-CM

## 2021-03-21 DIAGNOSIS — E785 Hyperlipidemia, unspecified: Secondary | ICD-10-CM

## 2021-03-21 DIAGNOSIS — Z9989 Dependence on other enabling machines and devices: Secondary | ICD-10-CM | POA: Diagnosis not present

## 2021-03-21 NOTE — Progress Notes (Signed)
Cardiology Office Note    Date:  03/22/2021   ID:  Jon Jones, DOB 11-06-1955, MRN [id number]  PCP:  Ladell Pier, MD  Cardiologist:  Shelva Majestic, MD (sleep); Dr. Gardiner Rhyme  66-monthfollow-up sleep evaluation   History of Present Illness:  Jon Poucheris a 66y.o. male who is followed by Dr. CBeatrix Fettersfor his cardiology care.  I saw him for my initial evaluation with me on December 14, 2020.  Mr. Jon Pedrohas known CAD and suffered a myocardial infarction in 2016.  Due to resultant ischemic cardiomyopathy he underwent ICD implantation.  He has a history of type 2 diabetes mellitus, PAD, hyperlipidemia, as well as obstructive sleep apnea.  [place]  [place] and was told of stopping breathing approximately 90 times per hour.  He was initially set on CPAP therapy at maximal 20 cm of water.  Apparently his DME company was [employer]  He has not had any recent downloads.  Over the past month he has had problems with his mask fitting and he has noticed more leak.  He admits to 100% compliance with CPAP therapy.  He typically goes to bed between 11 PM and midnight and wakes up at 9 AM.  He has nocturia approximately 1-2 times per night but otherwise believes he is sleeping well.  He is on Medicaid for insurance.    When I saw him for my initial evaluation, I gave him a new ResMed AirFit F 30 mask which we had as a sample.  Since it has been many years since his initial evaluation and with his previous high pressure requirement I recommended a reevaluation prior to obtaining a new wrist machine.  I discussed with him potential adverse consequences of untreated sleep apnea on his cardiovascular health and he recognized the importance of continued use and continued excellent  compliance.  He underwent a split-night sleep study on February 10, 2021.  On the diagnostic portion of the study AHI was consistent with severe sleep apnea at 31.8.  However events were very severe during REM sleep with an AHI of 60.  He had significant oxygen desaturation to a nadir of 67%.  CPAP was initiated at 5 cm and was titrated to optimal PAP pressure of 12 cm of water her AHI was 0 and O2 nadir was 90%.  Mr. PShellee Miloreceived a new air sense 11 AutoSet ResMed CPAP unit on March 10, 2021.  Over the past week he has been on therapy with excellent compliance.  Average use is 6 hours and 10 minutes per night.  AHI, however was increased at 9.3/h.  He states he typically goes to bed between 1030 and 11 PM and wakes up around 9 AM.  Oftentimes he goes to the bathroom at 5 AM and has not put the most mask back on.  He has not slept very well for the latter portion of the early morning.  He presents for evaluation.   Past Medical History:  Diagnosis Date   5 years ago    AICD (automatic cardioverter/defibrillator) present    Anxiety    Arthritis    Cataract    CHF (congestive heart failure) ([place]    Coronary artery disease    Diabetes mellitus without complication ([place]  Diabetic retinopathy ([place])    Dysrhythmia    GERD (gastroesophageal reflux disease)    PMH   Hypertension    Hypertensive retinopathy    Peripheral vascular disease ([name])    Peripheral vascular disease ([name])    Pneumonia    Shoulder impingement, right    Sleep apnea    wears cpap   Stomach ulcer    Wears glasses     Past Surgical History:  Procedure Laterality Date   AMPUTATION Left 03/12/2019   Procedure: 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;  Surgeon: [name], MD;  Location: [facility];  Service: Orthopedics;  Laterality: Left;   AMPUTATION Right 10/01/2020   Procedure: RIGHT 2nd TOE AMPUTATION;  Surgeon: Felipa Furnace, DPM;  Location: [facility];  Service: Podiatry;  Laterality: Right;   CARDIAC CATHETERIZATION      CARDIAC DEFIBRILLATOR PLACEMENT     CIRCUMCISION N/A 11/05/2020   Procedure: CIRCUMCISION ADULT;  Surgeon: Irine Seal, MD;  [facility];  Service: Urology;  Laterality: N/A;   I & D EXTREMITY Left 07/19/2016   Procedure: IRRIGATION AND DEBRIDEMENT EXTREMITY/OLECRANON(WASHOUT);  Surgeon: Leandrew Koyanagi, MD;  Location: [facility];  Service: Orthopedics;  Laterality: Left;   OLECRANON BURSECTOMY Left 07/19/2016   Procedure: LEFT ELBOW OLECRANON BURSECTOMY;  Surgeon: Leandrew Koyanagi, MD;  Location: [facility];  Service: Orthopedics;  Laterality: Left;   SHOULDER ARTHROSCOPY Right 05/16/2019   Procedure: Right Shoulder Arthroscopy;  Surgeon: [name], MD;  Location: [facility];  Service: Orthopedics;  Laterality: Right;    Current Medications: Outpatient Medications Prior to Visit  Medication Sig Dispense Refill   Accu-Chek Softclix Lancets lancets CHECK BLOOD SUGAR ONCE DAILY 100 each 2   aspirin 81 MG EC tablet Take 81 mg by mouth daily.     atorvastatin (LIPITOR) 80 MG tablet Take 1 tablet (80 mg total) by mouth every evening. 90 tablet 3   clopidogrel (PLAVIX) 75 MG tablet TAKE 1 TABLET BY MOUTH EVERY DAY 90 tablet 3   gabapentin (NEURONTIN) 300 MG capsule TAKE 1 CAPSULE(300 MG) BY MOUTH AT BEDTIME 30 capsule 5   glipiZIDE (GLUCOTROL) 10 MG tablet TAKE 2 TABLETS(20 MG) BY MOUTH TWICE DAILY 360 tablet 2   glucose blood (ACCU-CHEK AVIVA PLUS) test strip TEST 3 TIMES DAILY 100 strip 3   JARDIANCE 25 MG TABS tablet TAKE 1 TABLET(25 MG) BY MOUTH AT BEDTIME 90 tablet 0   metoprolol succinate (TOPROL-XL) 100 MG 24 hr tablet Take 1 tablet (100 mg total) by mouth daily. 90 tablet 3   Misc. Devices MISC Cpap supplies-tubing mask Dx G47.33 and Z99.89 1 Device 0   nitroGLYCERIN (NITROSTAT) 0.4 MG SL tablet Place 0.4 mg under the tongue See admin instructions. Every 5 minutes as needed for chest pain. Max of 3 doses     nystatin cream (MYCOSTATIN) SMARTSIG:Sparingly Topical 2-3 Times Daily PRN      RESTASIS 0.05 % ophthalmic emulsion Place 1 drop into both eyes in the morning and at bedtime.     sacubitril-valsartan (ENTRESTO) 24-26 MG Take 1 tablet by mouth 2 (two) times daily. 60 tablet 11   sildenafil (VIAGRA) 100 MG tablet Take 100 mg by mouth daily as needed.     sitaGLIPtin-metformin (JANUMET) 50-1000 MG tablet Take 1 tablet by mouth 2 (two) times daily with a meal. 180 tablet 3   spironolactone (ALDACTONE) 25 MG tablet Take 0.5 tablets (12.5 mg total) by mouth daily. 45 tablet 3   VENTOLIN HFA 108 (  90 Base) MCG/ACT inhaler INHALE 2 PUFFS INTO THE LUNGS EVERY 6 HOURS AS NEEDED FOR WHEEZING OR SHORTNESS OF BREATH 18 g 1   clobetasol ointment (TEMOVATE) 7.62 % Apply 1 application topically 2 (two) times daily as needed. 30 g 0   acetaminophen (TYLENOL) 325 MG tablet Take 2 tablets (650 mg total) by mouth every 6 (six) hours as needed for mild pain (or Fever >/= 101). (Patient not taking: Reported on 03/21/2021)     ciclopirox (PENLAC) 8 % solution Apply topically at bedtime. Apply over nail and surrounding skin. Apply daily over previous coat. After seven (7) days, may remove with alcohol and continue cycle. (Patient not taking: Reported on 03/21/2021) 6.6 mL 3   No facility-administered medications prior to visit.     Allergies:   Patient has no known allergies.   Social History   Socioeconomic History   Marital status: Married    Spouse name: Not on file   Number of children: Not on file   Years of education: Not on file   Highest education level: Not on file  Occupational History   Not on file  Tobacco Use   Smoking status: Former    Packs/day: 1.00    Years: 40.00    Pack years: 40.00    Types: Cigarettes    Quit date: 03/11/2019    Years since quitting: 2.0   Smokeless tobacco: Never  Vaping Use   Vaping Use: Never used  Substance and Sexual Activity   Alcohol use: Not Currently   Drug use: Not Currently    Types: Oxycodone   Sexual activity: Not on file  Other  Topics Concern   Not on file  Social History Narrative   Not on file   Social Determinants of Health   Financial Resource Strain: Not on file  Food Insecurity: Not on file  Transportation Needs: Not on file  Physical Activity: Not on file  Stress: Not on file  Social Connections: Not on file    Socially, he was born in [place].  He has lived in multiple states including [place], [place], [place], and more recently [place].  He is married has 3 children and 2 grandchildren.  He had worked in Production assistant, radio.  He does not drink alcohol or use drugs.  He does do occasional aerobic exercise.   Family History:  The patient's family history includes Diabetes in his mother; Heart disease in his mother; Peripheral vascular disease in his mother.  Both parents are deceased.  He has 2 brothers ages 65 and 70 and no sisters.  His children are 65, 34, and 29.  ROS General: Negative; No fevers, chills, or night sweats;  HEENT: Negative; No changes in vision or hearing, sinus congestion, difficulty swallowing Pulmonary: Negative; No cough, wheezing, shortness of breath, hemoptysis Cardiovascular: Prior inferior MI, ischemic cardiomyopathy, status post ICD implantation July 2018. GI: Negative; No nausea, vomiting, diarrhea, or abdominal pain GU: Negative; No dysuria, hematuria, or difficulty voiding Musculoskeletal: Negative; no myalgias, joint pain, or weakness Hematologic/Oncology: Negative; no easy bruising, bleeding Endocrine: Negative; no heat/cold intolerance; no diabetes Neuro: Negative; no changes in balance, headaches Skin: Negative; No rashes or skin lesions Psychiatric: Negative; No behavioral problems, depression Sleep: See HPI.  Previously was on 20 cm water pressure and initial AHI was approximately 90 events per hour. Other comprehensive 14 point system review is negative.   PHYSICAL EXAM:   VS:  BP 120/70 (BP Location: Left Arm)    Pulse  76    Ht _0  (1.676  m)    Wt 234 lb (106.1 kg)    SpO2 96%    BMI 37.77 kg/m     Repeat blood pressure by me was 112/70.  Wt Readings from Last 3 Encounters:  03/21/21 234 lb (106.1 kg)  02/10/21 230 lb (104.3 kg)  01/19/21 234 lb (106.1 kg)    General: Alert, oriented, no distress.  Skin: normal turgor, no rashes, warm and dry HEENT: Normocephalic, atraumatic. Pupils equal round and reactive to light; sclera anicteric; extraocular muscles intact;  Nose without nasal septal hypertrophy Mouth/Parynx benign; Mallinpatti scale 3 Neck: No JVD, no carotid bruits; normal carotid upstroke Lungs: clear to ausculatation and percussion; no wheezing or rales Chest wall: without tenderness to palpitation Heart: PMI not displaced, RRR, s1 s2 normal, 1/6 systolic murmur, no diastolic murmur, no rubs, gallops, thrills, or heaves Abdomen: soft, nontender; no hepatosplenomehaly, BS+; abdominal aorta nontender and not dilated by palpation. Back: no CVA tenderness Pulses 2+ Musculoskeletal: full range of motion, normal strength, no joint deformities Extremities: no clubbing cyanosis or edema, Homan's sign negative  Neurologic: grossly nonfocal; Cranial nerves grossly wnl Psychologic: Normal mood and affect    Studies/Labs Reviewed:   March 21, 2021 ECG (independently read by me): NSR at 76, smal q III  December 14, 2020 ECG (independently read by me):  NSR at 80, no ectopy, QTc 447 msec  Recent Labs: BMP Latest Ref Rng & Units 01/19/2021 10/19/2020 10/01/2020  Glucose 70 - 99 mg/dL 187(H) 260(H) 188(H)  BUN 8 - 27 mg/dL _1 Creatinine 0.76 - 1.27 mg/dL 1.08 0.96 1.13  BUN/Creat Ratio 10 - 24 17 - -  Sodium 134 - 144 mmol/L 138 138 136  Potassium 3.5 - 5.2 mmol/L 4.6 4.5 3.7  Chloride 96 - 106 mmol/L 102 106 104  CO2 20 - 29 mmol/L _2 Calcium 8.6 - 10.2 mg/dL 10.0 9.7 9.3     Hepatic Function Latest Ref Rng & Units 09/29/2020 04/26/2020 03/11/2019  Total Protein 6.5 - 8.1 g/dL 7.4 7.2 7.1  Albumin  3.5 - 5.0 g/dL 3.5 4.2 3.7  AST 15 - 41 U/L 26 21 34  ALT 0 - 44 U/L 22 26 51(H)  Alk Phosphatase 38 - 126 U/L 59 75 51  Total Bilirubin 0.3 - 1.2 mg/dL 0.6 0.5 0.4    CBC Latest Ref Rng & Units 10/19/2020 10/01/2020 09/30/2020  WBC 4.0 - 10.5 K/uL 8.0 7.4 8.4  Hemoglobin 13.0 - 17.0 g/dL 13.2 12.8(L) 12.0(L)  Hematocrit 39.0 - 52.0 % 42.3 40.1 36.6(L)  Platelets 150 - 400 K/uL 183 202 170   Lab Results  Component Value Date   MCV 95.1 10/19/2020   MCV 92.8 10/01/2020   MCV 91.7 09/30/2020   Lab Results  Component Value Date   TSH 1.88 07/23/2020   Lab Results  Component Value Date   HGBA1C 8.7 (H) 10/19/2020     BNP No results found for: BNP  ProBNP No results found for: PROBNP   Lipid Panel     Component Value Date/Time   CHOL 128 01/19/2021 0946   TRIG 199 (H) 01/19/2021 0946   HDL 33 (L) 01/19/2021 0946   CHOLHDL 3.9 01/19/2021 0946   LDLCALC 62 01/19/2021 0946   LABVLDL 33 01/19/2021 0946     RADIOLOGY: CUP PACEART REMOTE DEVICE CHECK  Result Date: 03/08/2021 Scheduled remote reviewed. Normal device function.  Next remote 91 days- JJB  Additional studies/ records that were reviewed today include:  I reviewed the records of Dr. Beatrix Fetters.  ECHO; 06/01/2020 IMPRESSIONS   1. Left ventricular ejection fraction, by estimation, is 35 to 40%. The  left ventricle has moderately decreased function. The left ventricle  demonstrates regional wall motion abnormalities (see scoring  diagram/findings for description). There is mild  left ventricular hypertrophy. Left ventricular diastolic parameters were  normal. There is severe akinesis of the left ventricular, basal-mid  inferior wall.   2. Right ventricular systolic function is normal. The right ventricular  size is normal.   3. The mitral valve is normal in structure. No evidence of mitral valve  regurgitation. No evidence of mitral stenosis.   4. The aortic valve is normal in structure. Aortic  valve regurgitation is  not visualized.    ZIO PATCH: 06/08/2020 One episode of NSVT lasting 5 beats Rare PVCs (<1% of beats)     Patch Wear Time:  2 days and 22 hours (2022-03-17T01:26:33-0400 to 2022-03-20T00:25:48-0400)   Patient had a min HR of 59 bpm, max HR of 148 bpm, and avg HR of 77 bpm. Predominant underlying rhythm was Sinus Rhythm. 1 run of Ventricular Tachycardia occurred lasting 5 beats with a max rate of 148 bpm (avg 134 bpm). Episode of Ventricular  Tachycardia may be Supraventricular Tachycardia with possible aberrancy. Isolated SVEs were rare (<1.0%), SVE Triplets were rare (<1.0%), and no SVE Couplets were present. Isolated VEs were rare (<1.0%), VE Couplets were rare (<1.0%), and no VE Triplets  were present.  No patient triggered events.  ASSESSMENT:    1. OSA on CPAP   2. Chronic combined systolic and diastolic heart failure ([place])   3. Coronary artery disease involving native coronary artery of native heart without angina pectoris   4. ICD (implantable cardioverter-defibrillator) in place   5. Hyperlipidemia with target LDL less than 70   6. Type 2 diabetes mellitus with peripheral neuropathy [facility])     PLAN:  Mr. Shamar Engelmann a very pleasant 66 year old gentleman who was born in [place] and has significant cardiovascular comorbidities including prior inferior myocardial infarction, subsequent ischemic cardiomyopathy with EF 35%, ICD implantation, history of tobacco, PAD, type 2 diabetes mellitus and hyperlipidemia.  He is on guideline directed medical therapy for his ischemic cardiomyopathy including Entresto, metoprolol succinate, aldosterone blockade with spironolactone and SGLT2 inhibition with Jardiance.  He has continued to be on DAPT with aspirin/Plavix and is on atorvastatin 80 mg for hyperlipidemia with most recent lipid panel in February 2022 at 74.  He was diagnosed with severe sleep apnea while living in [place] and apparently has been on CPAP therapy at 20 cm.  I  am not certain when his sleep evaluation was done or how old his current device is.  We were able to ascertain that his DME company when he lived in [place] was [employer] but they have been bought out by [employer].  When I initially saw him he was in need of new equipment.  I provided him with a new ResMed AirFit F 30i sample mask.  He underwent a reevaluation which we confirmed severe sleep apnea with overall AHI at 31.8 on the diagnostic portion of the study but sleep apnea was severe during REM sleep with a AHI of 60.0.  He had significant oxygen desaturation to a nadir of 67%.  He received a new ResMed air sense 11 CPAP auto unit and slept with his new therapy for the first time on March 13, 2021.  Over the past week, he has been compliant but average use has only been 6 hours and 10 minutes.  I discussed with him that preponderance of REM sleep occurs in the second half of the night and since he has been taking his mask off he goes to the bathroom at 5 AM and not putting it back on when he goes back to bed and often still sleeping for 3 to 4 hours later that he would need to put his mask back on prior to going back to bed at 5 AM.  This should even further improve compliance, reduce potential for daytime sleepiness and make a big impact particularly with the severity of his sleep apnea and REM sleep.  With his AHI at 9.3, I am changing his 12 cm set pressure to an auto mode at a range of 12 to 20 cm of water.  We will obtain a new download in approximately 1 month for establishment of 30-day compliance.  His blood pressure today is stable on his regimen consisting of Entresto 24/26 mg twice a day, metoprolol succinate 100 mg daily, spironolactone 12.5 mg.  He is on atorvastatin 80 mg for hyperlipidemia with most recent LDL cholesterol of 62 on January 19, 2021.  He is diabetic on glipizide Janumet in addition to his Vania Rea which also was helpful for his reduced LV function.  I will see him in 1 year for follow-up  evaluation or sooner as needed in interim.   Medication Adjustments/Labs and Tests Ordered: Current medicines are reviewed at length with the patient today.  Concerns regarding medicines are outlined above.  Medication changes, Labs and Tests ordered today are listed in the Patient Instructions below. Patient Instructions  Medication Instructions:  The current medical regimen is effective;  continue present plan and medications as directed. Please refer to the Current Medication list given to you today.  *If you need a refill on your cardiac medications before your next appointment, please call your pharmacy*  Follow-Up: Your next appointment:  12 month(s) In Person with Shelva Majestic, MD   Please call our office 2 months in advance to schedule this appointment If MD is not listed, click here to update    At [facility], you and your health needs are our priority.  As part of our continuing mission to provide you with exceptional heart care, we have created designated Provider Care Teams.  These Care Teams include your primary Cardiologist (physician) and Advanced Practice Providers (APPs -  Physician Assistants and Nurse Practitioners) who all work together to provide you with the care you need, when you need it.     Signed, Shelva Majestic, MD  03/22/2021 6:23 PM    [facility] [address] [place], [place],   [address] Phone: [phone]

## 2021-03-21 NOTE — Patient Instructions (Addendum)
Medication Instructions:  The current medical regimen is effective;  continue present plan and medications as directed. Please refer to the Current Medication list given to you today.  *If you need a refill on your cardiac medications before your next appointment, please call your pharmacy*  Follow-Up: Your next appointment:  12 month(s) In Person with Nicki [place], MD   Please call our office 2 months in advance to schedule this appointment If MD is not listed, click here to update    At [facility], you and your health needs are our priority.  As part of our continuing mission to provide you with exceptional heart care, we have created designated Provider Care Teams.  These Care Teams include your primary Cardiologist (physician) and Advanced Practice Providers (APPs -  Physician Assistants and Nurse Practitioners) who all work together to provide you with the care you need, when you need it.

## 2021-03-22 ENCOUNTER — Encounter: Payer: Self-pay | Admitting: Cardiovascular Disease

## 2021-03-22 NOTE — Telephone Encounter (Signed)
Requested Prescriptions  °Pending Prescriptions Disp Refills  °• clobetasol ointment (TEMOVATE) 0.05 % [Pharmacy Med Name: CLOBETASOL PROP 0.05% OINT 30GM] 30 g 0  °  Sig: APPLY TOPICALLY TO THE AFFECTED AREA TWICE DAILY AS NEEDED  °  ° Dermatology:  Corticosteroids Passed - 03/21/2021  1:11 PM  °  °  Passed - Valid encounter within last 12 months  °  Recent Outpatient Visits   °      ° 7 months ago Type 2 diabetes mellitus with peripheral neuropathy ([name])  ° [facility] Johnson, Deborah B, MD  ° 11 months ago Establishing care with new doctor, encounter for  ° [facility] Johnson, Deborah B, MD  °  °  °Future Appointments   °        ° In 1 month Cleaver, Jesse M, NP [facility]  °  ° °  °  °  ° ° °

## 2021-03-22 NOTE — Telephone Encounter (Signed)
Requested Prescriptions  Pending Prescriptions Disp Refills   clobetasol ointment (TEMOVATE) 0.05 % [Pharmacy Med Name: CLOBETASOL PROP 0.05% OINT 30GM] 30 g 0    Sig: APPLY TOPICALLY TO THE AFFECTED AREA TWICE DAILY AS NEEDED     Dermatology:  Corticosteroids Passed - 03/21/2021  1:11 PM      Passed - Valid encounter within last 12 months    Recent Outpatient Visits          7 months ago Type 2 diabetes mellitus with peripheral neuropathy [facility])   [place] Pier, MD   11 months ago Establishing care with new doctor, encounter for   [place], MD      Future Appointments            In 1 month Cleaver, Jossie Ng, NP [place], [facility]

## 2021-03-25 NOTE — Telephone Encounter (Signed)
Will forward to pcp

## 2021-03-25 NOTE — Telephone Encounter (Signed)
Patient states he informed PCP his can not take ointments but the script has to be in cream form. Patient would like PCP to send script reflecting cream not ointment as soon as possible.

## 2021-03-26 MED ORDER — CLOBETASOL PROPIONATE 0.05 % EX CREA: 1.0000 "application " | TOPICAL_CREAM | Freq: Two times a day (BID) | CUTANEOUS | 0 refills | Status: DC

## 2021-03-26 NOTE — Addendum Note (Signed)
Addended by: Jonah Blue B on: 03/26/2021 03:55 PM   Modules accepted: Orders

## 2021-03-28 NOTE — Telephone Encounter (Signed)
Contacted pt and lvm  

## 2021-04-01 NOTE — Progress Notes (Signed)
[facility] Note  04/05/2021     CHIEF COMPLAINT Patient presents for Retina Follow Up  HISTORY OF PRESENT ILLNESS: Jon Jones is a 66 y.o. male who presents to the clinic today for:   HPI     Retina Follow Up   Patient presents with  Diabetic Retinopathy.  In both eyes.  This started 5 months ago.  I, the attending physician,  performed the HPI with the patient and updated documentation appropriately.        Comments   Patient here for 5 months retina follow up for NPDR OU. Patient states vision so far so good. No eye pain. Eye get tired when watching tv or on facebook.       Last edited by Bernarda Caffey, MD on 04/06/2021  5:00 PM.    Pt doing well.  Using Restasis BID OU.  Referring physician: Shirleen Schirmer, PA-C Ladell Pier, MD [place],  [place] [id number]  HISTORICAL INFORMATION:   Selected notes from the MEDICAL RECORD NUMBER Referred by Shirleen Schirmer, PA for eval of diabetic retinopathy OU; DME OS   CURRENT MEDICATIONS: Current Outpatient Medications (Ophthalmic Drugs)  Medication Sig   RESTASIS 0.05 % ophthalmic emulsion Place 1 drop into both eyes in the morning and at bedtime.   No current facility-administered medications for this visit. (Ophthalmic Drugs)   Current Outpatient Medications (Other)  Medication Sig   Accu-Chek Softclix Lancets lancets CHECK BLOOD SUGAR ONCE DAILY   aspirin 81 MG EC tablet Take 81 mg by mouth daily.   atorvastatin (LIPITOR) 80 MG tablet Take 1 tablet (80 mg total) by mouth every evening.   clobetasol cream (TEMOVATE) [id number] % Apply 1 application topically 2 (two) times daily.   clopidogrel (PLAVIX) 75 MG tablet TAKE 1 TABLET BY MOUTH EVERY DAY   gabapentin (NEURONTIN) 300 MG capsule TAKE 1 CAPSULE(300 MG) BY MOUTH AT BEDTIME   glipiZIDE (GLUCOTROL) 10 MG tablet TAKE 2 TABLETS(20 MG) BY MOUTH TWICE DAILY   glucose blood (ACCU-CHEK AVIVA PLUS) test strip TEST 3 TIMES DAILY    JARDIANCE 25 MG TABS tablet TAKE 1 TABLET(25 MG) BY MOUTH AT BEDTIME   metoprolol succinate (TOPROL-XL) 100 MG 24 hr tablet Take 1 tablet (100 mg total) by mouth daily.   Misc. Devices MISC Cpap supplies-tubing mask Dx G47.33 and Z99.89   nitroGLYCERIN (NITROSTAT) 0.4 MG SL tablet Place 0.4 mg under the tongue See admin instructions. Every 5 minutes as needed for chest pain. Max of 3 doses   nystatin cream (MYCOSTATIN) SMARTSIG:Sparingly Topical 2-3 Times Daily PRN   sacubitril-valsartan (ENTRESTO) 24-26 MG Take 1 tablet by mouth 2 (two) times daily.   sildenafil (VIAGRA) 100 MG tablet Take 100 mg by mouth daily as needed.   spironolactone (ALDACTONE) 25 MG tablet Take 0.5 tablets (12.5 mg total) by mouth daily.   VENTOLIN HFA 108 (90 Base) MCG/ACT inhaler INHALE 2 PUFFS INTO THE LUNGS EVERY 6 HOURS AS NEEDED FOR WHEEZING OR SHORTNESS OF BREATH   acetaminophen (TYLENOL) 325 MG tablet Take 2 tablets (650 mg total) by mouth every 6 (six) hours as needed for mild pain (or Fever >/= 101). (Patient not taking: Reported on 03/21/2021)   ciclopirox (PENLAC) 8 % solution Apply topically at bedtime. Apply over nail and surrounding skin. Apply daily over previous coat. After seven (7) days, may remove with alcohol and continue cycle. (Patient not taking: Reported on 03/21/2021)   sitaGLIPtin-metformin (JANUMET) 50-1000 MG tablet  Take 1 tablet by mouth 2 (two) times daily with a meal.   No current facility-administered medications for this visit. (Other)   REVIEW OF SYSTEMS: ROS   Positive for: Endocrine, Cardiovascular, Eyes, Respiratory Negative for: Constitutional, Gastrointestinal, Neurological, Skin, Genitourinary, Musculoskeletal, HENT, Psychiatric, Allergic/Imm, Heme/Lymph Last edited by Theodore Demark, COA on 04/05/2021  9:44 AM.     ALLERGIES No Known Allergies  PAST MEDICAL HISTORY Past Medical History:  Diagnosis Date   5 years ago    AICD (automatic cardioverter/defibrillator) present     Anxiety    Arthritis    Cataract    CHF (congestive heart failure) ([name])    Coronary artery disease    Diabetes mellitus without complication ([name])    Diabetic retinopathy ([place])    Dysrhythmia    GERD (gastroesophageal reflux disease)    PMH   Hypertension    Hypertensive retinopathy    Peripheral vascular disease ([name])    Peripheral vascular disease ([place])    Pneumonia    Shoulder impingement, right    Sleep apnea    wears cpap   Stomach ulcer    Wears glasses    Past Surgical History:  Procedure Laterality Date   AMPUTATION Left 03/12/2019   Procedure: 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;  Surgeon: [name], MD;  Location: [facility];  Service: Orthopedics;  Laterality: Left;   AMPUTATION Right 10/01/2020   Procedure: RIGHT 2nd TOE AMPUTATION;  Surgeon: Felipa Furnace, DPM;  Location: [facility];  Service: Podiatry;  Laterality: Right;   CARDIAC CATHETERIZATION     CARDIAC DEFIBRILLATOR PLACEMENT     CIRCUMCISION N/A 11/05/2020   Procedure: CIRCUMCISION ADULT;  Surgeon: Irine Seal, MD;  Location: [facility];  Service: Urology;  Laterality: N/A;   I & D EXTREMITY Left 07/19/2016   Procedure: IRRIGATION AND DEBRIDEMENT EXTREMITY/OLECRANON(WASHOUT);  Surgeon: Leandrew Koyanagi, MD;  Location: [facility];  Service: Orthopedics;  Laterality: Left;   OLECRANON BURSECTOMY Left 07/19/2016   Procedure: LEFT ELBOW OLECRANON BURSECTOMY;  Surgeon: Leandrew Koyanagi, MD;  Location: [facility];  Service: Orthopedics;  Laterality: Left;   SHOULDER ARTHROSCOPY Right 05/16/2019   Procedure: Right Shoulder Arthroscopy;  Surgeon: [name], MD;  Location: [facility];  Service: Orthopedics;  Laterality: Right;   FAMILY HISTORY Family History  Problem Relation Age of Onset   Heart disease Mother    Diabetes Mother    Peripheral vascular disease Mother    SOCIAL HISTORY Social History   Tobacco Use   Smoking status: Former    Packs/day: 1.00    Years: 40.00    Pack years: 40.00    Types: Cigarettes     Quit date: 03/11/2019    Years since quitting: 2.0   Smokeless tobacco: Never  Vaping Use   Vaping Use: Never used  Substance Use Topics   Alcohol use: Not Currently   Drug use: Not Currently    Types: Oxycodone       OPHTHALMIC EXAM:  Base Eye Exam     Visual Acuity (Snellen - Linear)       Right Left   Dist cc 20/20 20/25 -2   Dist ph cc  20/20 -2    Correction: Glasses         Tonometry (Tonopen, 9:41 AM)       Right Left   Pressure 10 11         Pupils       Dark Light Shape React APD  Right 3 2 Round Brisk None   Left 3 2 Round Brisk None         Visual Fields (Counting fingers)       Left Right    Full Full         Extraocular Movement       Right Left    Full, Ortho Full, Ortho         Neuro/Psych     Oriented x3: Yes   Mood/Affect: Normal         Dilation     Both eyes: 1.0% Mydriacyl, 2.5% Phenylephrine @ 9:41 AM           Slit Lamp and Fundus Exam     Slit Lamp Exam       Right Left   Lids/Lashes Dermatochalasis - upper lid Dermatochalasis - upper lid   Conjunctiva/Sclera nasal pingeucula nasal pingeucula   Cornea arcus, tear film debris, 1+ PEE arcus, tear film debris, 1+PEE, old subepithelial scars inferior paracentral with haze   Anterior Chamber Deep and quiet Deep and quiet   Iris Round and dilated, No NVI Round and dilated, No NVI   Lens 2-3+ Nuclear sclerosis, 2-3+ Cortical cataract 2-3+ Nuclear sclerosis, 2-3+ Cortical cataract   Anterior Vitreous Vitreous syneresis Vitreous syneresis         Fundus Exam       Right Left   Disc Pink and Sharp, Compact Pink and Sharp, Compact   C/D Ratio 0.1 0.2   Macula Flat, Blunted foveal reflex, + fine MA Flat, Blunted foveal reflex, +MA/DBH, trace cystic changes nasal fovea--slightly increased   Vessels attenuated, mild tortuousity, mild AV crossing changes, mild copper wiring attenuated, mild tortuousity, mild copper wiring, mild AV crossing changes   Periphery  Attached; rare MA Attached; scattered MA & DBH           Refraction     Wearing Rx       Sphere Cylinder Axis Add   Right +1.25 +1.00 137 +2.50   Left +1.00 +0.50 030 +2.50    Type: PAL           IMAGING AND PROCEDURES  Imaging and Procedures for 04/05/2021  OCT, Retina - OU - Both Eyes       Right Eye Quality was good. Central Foveal Thickness: 247. Progression has been stable. Findings include normal foveal contour, no IRF, no SRF (Focal cystic changes temporal fovea).   Left Eye Quality was good. Central Foveal Thickness: 256. Progression has worsened. Findings include normal foveal contour, intraretinal hyper-reflective material, intraretinal fluid, no SRF (Focal cystic changes / IRHM nasal fovea--persistent, slightly increased).   Notes *Images captured and stored on drive  Diagnosis / Impression:  OD: Focal cystic changes temporal fovea OS: Focal cystic changes / IRHM nasal fovea--persistent, slightly increased  Clinical management:  See below  Abbreviations: NFP - Normal foveal profile. CME - cystoid macular edema. PED - pigment epithelial detachment. IRF - intraretinal fluid. SRF - subretinal fluid. EZ - ellipsoid zone. ERM - epiretinal membrane. ORA - outer retinal atrophy. ORT - outer retinal tubulation. SRHM - subretinal hyper-reflective material. IRHM - intraretinal hyper-reflective material            ASSESSMENT/PLAN:    ICD-10-CM   1. Moderate nonproliferative diabetic retinopathy of both eyes with macular edema associated with type 2 diabetes mellitus ([name])  E11.3313 OCT, Retina - OU - Both Eyes    2. Essential hypertension  I10     3.  Hypertensive retinopathy of both eyes  H35.033     4. Combined forms of age-related cataract of both eyes  H25.813      1. Moderate nonproliferative diabetic retinopathy OU  OD: no DME  OS: mild cystic changes / IRHM nasal macula  - last A1c in Epic is 8.7 on 08.09.22 - pt working with Endodrinologist  and blood glucose improving  - exam shows scattered MA OU - FA (06.21.22) shows late leaking MA, no NV OU - OCT OD Focal cystic changes temporal fovea; OS: Focal cystic changes / IRHM nasal fovea--persistent, slightly increased - BCVA 20/20 OU -- no intervention recommended at this time -- monitor - f/u in 6 mos, sooner -- DFE/OCT  2,3. Hypertensive retinopathy OU - discussed importance of tight BP control - monitor  4. Mixed Cataract OU - The symptoms of cataract, surgical options, and treatments and risks were discussed with patient. - discussed diagnosis and progression - monitor for now  Ophthalmic Meds Ordered this visit:  No orders of the defined types were placed in this encounter.     Return in about 6 months (around 10/03/2021) for mod NPDR OU w/DFE&OCT.  There are no Patient Instructions on file for this visit.  Explained the diagnoses, plan, and follow up with the patient and they expressed understanding.  Patient expressed understanding of the importance of proper follow up care.   This document serves as a record of services personally performed by Gardiner Sleeper, MD, PhD. It was created on their behalf by Estill Bakes, COT an ophthalmic technician. The creation of this record is the provider's dictation and/or activities during the visit.    Electronically signed by: Estill Bakes, COT 1.20.23 @ 5:01 PM   This document serves as a record of services personally performed by Gardiner Sleeper, MD, PhD. It was created on their behalf by Estill Bakes, COT an ophthalmic technician. The creation of this record is the provider's dictation and/or activities during the visit.    Electronically signed by: Estill Bakes, COT 1.24.23 @ 5:01 PM   Gardiner Sleeper, M.D., Ph.D. Diseases & Surgery of the Retina and [place] 1.24.23  I have reviewed the above documentation for accuracy and completeness, and I agree with the above. Gardiner Sleeper, M.D., Ph.D. 04/06/21 5:03 PM   Abbreviations: M myopia (nearsighted); A astigmatism; H hyperopia (farsighted); P presbyopia; Mrx spectacle prescription;  CTL contact lenses; OD right eye; OS left eye; OU both eyes  XT exotropia; ET esotropia; PEK punctate epithelial keratitis; PEE punctate epithelial erosions; DES dry eye syndrome; MGD meibomian gland dysfunction; ATs artificial tears; PFAT's preservative free artificial tears; [place] nuclear sclerotic cataract; PSC posterior subcapsular cataract; ERM epi-retinal membrane; PVD posterior vitreous detachment; RD retinal detachment; DM diabetes mellitus; DR diabetic retinopathy; NPDR non-proliferative diabetic retinopathy; PDR proliferative diabetic retinopathy; CSME clinically significant macular edema; DME diabetic macular edema; dbh dot blot hemorrhages; CWS cotton wool spot; POAG primary open angle glaucoma; C/D cup-to-disc ratio; HVF humphrey visual field; GVF goldmann visual field; OCT optical coherence tomography; IOP intraocular pressure; BRVO Branch retinal vein occlusion; CRVO central retinal vein occlusion; CRAO central retinal artery occlusion; BRAO branch retinal artery occlusion; RT retinal tear; SB scleral buckle; PPV pars plana vitrectomy; VH Vitreous hemorrhage; PRP panretinal laser photocoagulation; IVK intravitreal kenalog; VMT vitreomacular traction; MH Macular hole;  NVD neovascularization of the disc; NVE neovascularization elsewhere; AREDS age related eye disease study; ARMD age related macular degeneration; POAG primary open  angle glaucoma; EBMD epithelial/anterior basement membrane dystrophy; ACIOL anterior chamber intraocular lens; IOL intraocular lens; PCIOL posterior chamber intraocular lens; Phaco/IOL phacoemulsification with intraocular lens placement; [place] photorefractive keratectomy; LASIK laser assisted in situ keratomileusis; HTN hypertension; DM diabetes mellitus; COPD chronic obstructive pulmonary disease

## 2021-04-05 ENCOUNTER — Other Ambulatory Visit: Payer: Self-pay

## 2021-04-05 ENCOUNTER — Other Ambulatory Visit: Payer: Self-pay | Admitting: Internal Medicine

## 2021-04-05 ENCOUNTER — Ambulatory Visit (INDEPENDENT_AMBULATORY_CARE_PROVIDER_SITE_OTHER): Payer: Medicare Other | Admitting: Ophthalmology

## 2021-04-05 ENCOUNTER — Encounter (INDEPENDENT_AMBULATORY_CARE_PROVIDER_SITE_OTHER): Payer: Self-pay | Admitting: Ophthalmology

## 2021-04-05 DIAGNOSIS — I10 Essential (primary) hypertension: Secondary | ICD-10-CM

## 2021-04-05 DIAGNOSIS — H25813 Combined forms of age-related cataract, bilateral: Secondary | ICD-10-CM | POA: Diagnosis not present

## 2021-04-05 DIAGNOSIS — E113313 Type 2 diabetes mellitus with moderate nonproliferative diabetic retinopathy with macular edema, bilateral: Secondary | ICD-10-CM | POA: Diagnosis not present

## 2021-04-05 DIAGNOSIS — H35033 Hypertensive retinopathy, bilateral: Secondary | ICD-10-CM | POA: Diagnosis not present

## 2021-04-05 DIAGNOSIS — E1142 Type 2 diabetes mellitus with diabetic polyneuropathy: Secondary | ICD-10-CM

## 2021-04-05 NOTE — Telephone Encounter (Signed)
Requested medication (s) are due for refill today:   Yes  Requested medication (s) are on the active medication list:   Yes  Future visit scheduled:   No   Last ordered: 10/07/2020 #30, 5 refills  Returned because there are no refills remaining.   Requested Prescriptions  Pending Prescriptions Disp Refills   gabapentin (NEURONTIN) 300 MG capsule [Pharmacy Med Name: GABAPENTIN 300MG  CAPSULES] 30 capsule 5    Sig: TAKE 1 CAPSULE(300 MG) BY MOUTH AT BEDTIME     Neurology: Anticonvulsants - gabapentin Passed - 04/05/2021  8:03 AM      Passed - Valid encounter within last 12 months    Recent Outpatient Visits           7 months ago Type 2 diabetes mellitus with peripheral neuropathy [facility])   [facility], MD   11 months ago Establishing care with new doctor, encounter for   [facility], MD       Future Appointments             In 2 weeks Cleaver, Marcine Matar, NP [facility], [facility]

## 2021-04-06 ENCOUNTER — Encounter (INDEPENDENT_AMBULATORY_CARE_PROVIDER_SITE_OTHER): Payer: Self-pay | Admitting: Ophthalmology

## 2021-04-07 ENCOUNTER — Other Ambulatory Visit: Payer: Self-pay | Admitting: Internal Medicine

## 2021-04-07 NOTE — Telephone Encounter (Signed)
Requested medication (s) are due for refill today: requesting early  Requested medication (s) are on the active medication list: yes  Last refill:  03/14/21  Future visit scheduled: no  Notes to clinic:  protocol dictates notifying provider if pt requests inhaler early, please assess.   Requested Prescriptions  Pending Prescriptions Disp Refills   VENTOLIN HFA 108 (90 Base) MCG/ACT inhaler [Pharmacy Med Name: VENTOLIN HFA INH W/DOS CTR 200PUFFS] 18 g 1    Sig: INHALE 2 PUFFS BY MOUTH EVERY 6 HOURS AS NEEDED FOR WHEEZING OR SHORTNESS OF BREATH     Pulmonology:  Beta Agonists Failed - 04/07/2021  3:18 AM      Failed - One inhaler should last at least one month. If the patient is requesting refills earlier, contact the patient to check for uncontrolled symptoms.      Passed - Valid encounter within last 12 months    Recent Outpatient Visits           7 months ago Type 2 diabetes mellitus with peripheral neuropathy [facility])   [place] Ladell Pier, MD   11 months ago Establishing care with new doctor, encounter for   [place], MD       Future Appointments             In 2 weeks Cleaver, Jossie Ng, NP [place], [facility]

## 2021-04-08 ENCOUNTER — Other Ambulatory Visit: Payer: Self-pay

## 2021-04-08 MED ORDER — EMPAGLIFLOZIN 25 MG PO TABS: ORAL_TABLET | ORAL | 0 refills | Status: AC

## 2021-04-08 MED ORDER — GLIPIZIDE 10 MG PO TABS: ORAL_TABLET | ORAL | 2 refills | Status: AC

## 2021-04-08 MED ORDER — JANUMET 50-1000 MG PO TABS: 1.0000 | ORAL_TABLET | Freq: Two times a day (BID) | ORAL | 3 refills | Status: DC

## 2021-04-08 NOTE — Telephone Encounter (Signed)
Script sent  

## 2021-04-08 NOTE — Telephone Encounter (Signed)
Pt states he has a new pcp

## 2021-04-17 ENCOUNTER — Other Ambulatory Visit: Payer: Self-pay | Admitting: Internal Medicine

## 2021-04-17 DIAGNOSIS — E1142 Type 2 diabetes mellitus with diabetic polyneuropathy: Secondary | ICD-10-CM

## 2021-04-19 ENCOUNTER — Other Ambulatory Visit: Payer: Self-pay | Admitting: *Deleted

## 2021-04-19 MED ORDER — ATORVASTATIN CALCIUM 80 MG PO TABS: 80.0000 mg | ORAL_TABLET | Freq: Every evening | ORAL | 3 refills | Status: AC

## 2021-04-19 MED ORDER — METOPROLOL SUCCINATE ER 100 MG PO TB24: 100.0000 mg | ORAL_TABLET | Freq: Every day | ORAL | 3 refills | Status: DC

## 2021-04-21 ENCOUNTER — Ambulatory Visit: Payer: Medicare Other | Admitting: General Practice

## 2021-04-25 ENCOUNTER — Other Ambulatory Visit (HOSPITAL_COMMUNITY): Payer: Self-pay

## 2021-04-29 ENCOUNTER — Other Ambulatory Visit: Payer: Self-pay

## 2021-04-29 ENCOUNTER — Ambulatory Visit (INDEPENDENT_AMBULATORY_CARE_PROVIDER_SITE_OTHER): Payer: Medicare Other | Admitting: Podiatry

## 2021-04-29 ENCOUNTER — Encounter: Payer: Self-pay | Admitting: Podiatry

## 2021-04-29 DIAGNOSIS — E1159 Type 2 diabetes mellitus with other circulatory complications: Secondary | ICD-10-CM

## 2021-04-29 DIAGNOSIS — I739 Peripheral vascular disease, unspecified: Secondary | ICD-10-CM

## 2021-04-29 DIAGNOSIS — M79675 Pain in left toe(s): Secondary | ICD-10-CM

## 2021-04-29 DIAGNOSIS — Z89421 Acquired absence of other right toe(s): Secondary | ICD-10-CM

## 2021-04-29 DIAGNOSIS — M79674 Pain in right toe(s): Secondary | ICD-10-CM

## 2021-04-29 DIAGNOSIS — B351 Tinea unguium: Secondary | ICD-10-CM

## 2021-04-29 NOTE — Progress Notes (Signed)
This patient returns to my office for at risk foot care.  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  This patient presents for at risk foot care today.  General Appearance  Alert, conversant and in no acute stress.  Vascular  Dorsalis pedis and posterior tibial  pulses are palpable  bilaterally.  Capillary return is within normal limits  bilaterally. Temperature is within normal limits  bilaterally.  Neurologic  Senn-Weinstein monofilament wire test within normal limits  bilaterally. Muscle power within normal limits bilaterally.  Nails Thick disfigured discolored nails with subungual debris  from  1-4  toes left. and 1,3-5 toes right. . No evidence of bacterial infection or drainage bilaterally.  Orthopedic  No limitations of motion  feet .  No crepitus or effusions noted.  No bony pathology or digital deformities noted.  Skin  normotropic skin with no porokeratosis noted bilaterally.  No signs of infections or ulcers noted.     Onychomycosis  Pain in right toes  Pain in left toes  Consent was obtained for treatment procedures.   Mechanical debridement of nails 1-4 left and 1,3-5 right performed with a nail nipper.  Filed with dremel without incident.    Return office visit   3 months                   Told patient to return for periodic foot care and evaluation due to potential at risk complications.   Maricella Filyaw DPM   

## 2021-05-02 ENCOUNTER — Other Ambulatory Visit: Payer: Self-pay

## 2021-05-02 MED ORDER — JANUMET 50-1000 MG PO TABS: 1.0000 | ORAL_TABLET | Freq: Two times a day (BID) | ORAL | 3 refills | Status: DC

## 2021-05-02 NOTE — Telephone Encounter (Signed)
Script resent

## 2021-05-03 ENCOUNTER — Other Ambulatory Visit: Payer: Self-pay | Admitting: Internal Medicine

## 2021-05-03 NOTE — Telephone Encounter (Signed)
Courtesy refill. Pt needs to schedule an OV for future refills  Requested Prescriptions  Pending Prescriptions Disp Refills   VENTOLIN HFA 108 (90 Base) MCG/ACT inhaler [Pharmacy Med Name: VENTOLIN HFA INH W/DOS CTR 200PUFFS] 18 g 0    Sig: INHALE 2 PUFFS BY MOUTH EVERY 6 HOURS AS NEEDED FOR WHEEZING OR SHORTNESS OF BREATH     Pulmonology:  Beta Agonists 2 Passed - 05/03/2021  3:18 AM      Passed - Last BP in normal range    BP Readings from Last 1 Encounters:  03/21/21 120/70         Passed - Last Heart Rate in normal range    Pulse Readings from Last 1 Encounters:  03/21/21 76         Passed - Valid encounter within last 12 months    Recent Outpatient Visits          8 months ago Type 2 diabetes mellitus with peripheral neuropathy ([name])   [facility] Marcine Matar, MD   1 year ago Establishing care with new doctor, encounter for   [facility] Marcine Matar, MD      Future Appointments            In 1 month Cleaver, Thomasene Ripple, NP [facility], [facility]

## 2021-05-03 NOTE — Telephone Encounter (Signed)
Pt called, LVMTCB to schedule an appt which is needed for med refill.

## 2021-05-24 NOTE — Progress Notes (Signed)
? ?Cardiology Clinic Note  ? ?Patient Name: Jon Jones ?Date of Encounter: 05/24/2021 ? ?Primary Care Provider:  Pcp, No ?Primary Cardiologist:  Donato Heinz, MD ? ?Patient Profile  ?  ?Jon Jones 66 year old male presents to the clinic today for follow-up evaluation of his hypertension and coronary artery disease. ? ?Past Medical History  ?  ?Past Medical History:  ?Diagnosis Date  ? 5 years ago   ? AICD (automatic cardioverter/defibrillator) present   ? Anxiety   ? Arthritis   ? Cataract   ? CHF (congestive heart failure) ([place])   ? Coronary artery disease   ? Diabetes mellitus without complication ([place])   ? Diabetic retinopathy ([place])   ? Dysrhythmia   ? GERD (gastroesophageal reflux disease)   ? PMH  ? Hypertension   ? Hypertensive retinopathy   ? Peripheral vascular disease ([place])   ? Peripheral vascular disease ([place])   ? Pneumonia   ? Shoulder impingement, right   ? Sleep apnea   ? wears cpap  ? Stomach ulcer   ? Wears glasses   ? ?Past Surgical History:  ?Procedure Laterality Date  ? AMPUTATION Left 03/12/2019  ? Procedure: 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;  Surgeon: [name], MD;  Location: [place];  Service: Orthopedics;  Laterality: Left;  ? AMPUTATION Right 10/01/2020  ? Procedure: RIGHT 2nd TOE AMPUTATION;  Surgeon: Felipa Furnace, DPM;  Location: [place];  Service: Podiatry;  Laterality: Right;  ? CARDIAC CATHETERIZATION    ? CARDIAC DEFIBRILLATOR PLACEMENT    ? CIRCUMCISION N/A 11/05/2020  ? Procedure: CIRCUMCISION ADULT;  Surgeon: Irine Seal, MD;  [facility];  Service: Urology;  Laterality: N/A;  ? I & D EXTREMITY Left 07/19/2016  ? Procedure: IRRIGATION AND DEBRIDEMENT EXTREMITY/OLECRANON(WASHOUT);  Surgeon: Leandrew Koyanagi, MD;  Location: [place];  Service: Orthopedics;  Laterality: Left;  ? OLECRANON BURSECTOMY Left 07/19/2016  ? Procedure: LEFT ELBOW OLECRANON BURSECTOMY;  Surgeon: Leandrew Koyanagi, MD;  Location: [place];  Service: Orthopedics;  Laterality: Left;  ? SHOULDER  ARTHROSCOPY Right 05/16/2019  ? Procedure: Right Shoulder Arthroscopy;  Surgeon: [name], MD;  Location: [place];  Service: Orthopedics;  Laterality: Right;  ? ? ?Allergies ? ?No Known Allergies ? ?History of Present Illness  ?  ?Jon Jones is a PMH of HTN, diabetes mellitus type 2, PAD, CAD, chronic combined systolic and diastolic CHF, OSA on CPAP, idiopathic chronic gout with tophi, and cellulitis.  His PMH also includes cardiomyopathy EF 35% status post ICD.  He was initially seen by cardiology for evaluation of his coronary artery disease 05/21/2020.  He had inferior MI and ongoing in 2015.  His echocardiogram 12/16 showed an LVEF of 35%.  He was being followed at [facility].  His echocardiogram 2/18 showed LVEF 35% no change from prior echocardiogram.  He underwent ICD implantation 7/18.  He moved to [place] 45 years prior.  He wished to transfer his care to [facility].  His echocardiogram 3/22 showed an LVEF of 35-40%, severe akinesis of the basal mid inferior wall and normal RV function.  No significant valvular abnormalities were noted.  ABIs 3/22 showed right 0.91, left 0.87.  Cardiac event monitor 3/22 showed 1 episode of NSVT lasting 5 beats and rare PVCs. ? ?He was seen in follow-up by Dr. Gardiner Rhyme 01/19/2021.  During that time he reported he was feeling well.  He denies chest pain, lightheadedness, presyncope and syncope.  He did report some dyspnea with increased physical  activity.  He reported occasional lower extremity swelling.  He was planning surgery for spinal stenosis.  He was able to walk up 2 flights of stairs without stopping and denied exertional type symptoms. ? ?Who presents to the clinic today for follow-up evaluation states he feels well.  He has been trying to use his new CPAP facemask.  He reports that he has been having trouble with that leaking with movement in his bed.  He feels that he was sleeping better with his old CPAP device.  He has started going to the gym 6 days/week.  He  reports that he is eating a heart healthy low-sodium diet.  He has been managing weight.  He denies chest pain and shortness of breath.  He does note occasional episodes of dizziness when he gets up too fast.  He has been moving more slowly from laying to sitting and sitting to standing.  His blood pressure is well controlled at 102/60.  I will have him continue his current diet, physical activity, medications, follow-up as scheduled with Dr. Claiborne Billings and have him follow-up for reevaluation with Dr. Gardiner Rhyme in 6 months. ?  ? ?Today he denies chest pain, shortness of breath, lower extremity edema, fatigue, palpitations, melena, hematuria, hemoptysis, diaphoresis, weakness, presyncope, syncope, orthopnea, and PND. ? ? ?Home Medications  ?  ?Prior to Admission medications   ?Medication Sig Start Date End Date Taking? Authorizing Provider  ?VENTOLIN HFA 108 (90 Base) MCG/ACT inhaler INHALE 2 PUFFS BY MOUTH EVERY 6 HOURS AS NEEDED FOR WHEEZING OR SHORTNESS OF BREATH 05/03/21   Ladell Pier, MD  ?ACCU-CHEK AVIVA PLUS test strip TEST 3 TIMES DAILY 04/18/21   Shamleffer, Melanie Crazier, MD  ?Accu-Chek Softclix Lancets lancets CHECK BLOOD SUGAR ONCE DAILY 08/12/20   Ladell Pier, MD  ?acetaminophen (TYLENOL) 325 MG tablet Take 2 tablets (650 mg total) by mouth every 6 (six) hours as needed for mild pain (or Fever >/= 101). ?Patient not taking: Reported on 03/21/2021 10/01/20   Lurline Del, DO  ?aspirin 81 MG EC tablet Take 81 mg by mouth daily. 09/12/18   [provider]  ?atorvastatin (LIPITOR) 80 MG tablet Take 1 tablet (80 mg total) by mouth every evening. 04/19/21   Donato Heinz, MD  ?ciclopirox (PENLAC) 8 % solution Apply topically at bedtime. Apply over nail and surrounding skin. Apply daily over previous coat. After seven (7) days, may remove with alcohol and continue cycle. ?Patient not taking: Reported on 03/21/2021 08/24/20   Ladell Pier, MD  ?clobetasol cream (TEMOVATE) [id number] % Apply 1  application topically 2 (two) times daily. 03/26/21   Ladell Pier, MD  ?clopidogrel (PLAVIX) 75 MG tablet TAKE 1 TABLET BY MOUTH EVERY DAY 02/16/21   Troy Sine, MD  ?empagliflozin (JARDIANCE) 25 MG TABS tablet TAKE 1 TABLET(25 MG) BY MOUTH AT BEDTIME 04/08/21   Shamleffer, Melanie Crazier, MD  ?gabapentin (NEURONTIN) 300 MG capsule TAKE 1 CAPSULE(300 MG) BY MOUTH AT BEDTIME 04/07/21   Ladell Pier, MD  ?glipiZIDE (GLUCOTROL) 10 MG tablet TAKE 2 TABLETS(20 MG) BY MOUTH TWICE DAILY 04/08/21   Shamleffer, Melanie Crazier, MD  ?metoprolol succinate (TOPROL-XL) 100 MG 24 hr tablet Take 1 tablet (100 mg total) by mouth daily. 04/19/21   Donato Heinz, MD  ?Misc. Devices MISC Cpap supplies-tubing mask ?Dx [id number] and Z99.89 07/20/20   Ladell Pier, MD  ?nitroGLYCERIN (NITROSTAT) 0.4 MG SL tablet Place 0.4 mg under the tongue See admin instructions. Every 5  minutes as needed for chest pain. Max of 3 doses 04/03/16   [provider]  ?nystatin cream (MYCOSTATIN) SMARTSIG:Sparingly Topical 2-3 Times Daily PRN 01/12/21   [provider]  ?RESTASIS 0.05 % ophthalmic emulsion Place 1 drop into both eyes in the morning and at bedtime. 09/25/20   [provider]  ?sacubitril-valsartan (ENTRESTO) 24-26 MG Take 1 tablet by mouth 2 (two) times daily. 09/10/20   Donato Heinz, MD  ?sildenafil (VIAGRA) 100 MG tablet Take 100 mg by mouth daily as needed. 01/03/21   [provider]  ?sitaGLIPtin-metformin (JANUMET) 50-1000 MG tablet Take 1 tablet by mouth 2 (two) times daily with a meal. 05/02/21   Shamleffer, Melanie Crazier, MD  ?spironolactone (ALDACTONE) 25 MG tablet Take 0.5 tablets (12.5 mg total) by mouth daily. 09/15/20   Donato Heinz, MD  ? ? ?Family History  ?  ?Family History  ?Problem Relation Age of Onset  ? Heart disease Mother   ? Diabetes Mother   ? Peripheral vascular disease Mother   ? ?He indicated that his mother is deceased. He indicated that  his father is deceased. ? ?Social History  ?  ?Social History  ? ?Socioeconomic History  ? Marital status: Married  ?  Spouse name: Not on file  ? Number of children: Not on file  ? Years of education: Not on file

## 2021-05-26 ENCOUNTER — Other Ambulatory Visit: Payer: Self-pay | Admitting: Internal Medicine

## 2021-05-26 ENCOUNTER — Ambulatory Visit (INDEPENDENT_AMBULATORY_CARE_PROVIDER_SITE_OTHER): Payer: Medicare Other | Admitting: General Practice

## 2021-05-26 ENCOUNTER — Other Ambulatory Visit: Payer: Self-pay

## 2021-05-26 ENCOUNTER — Encounter: Payer: Self-pay | Admitting: General Practice

## 2021-05-26 VITALS — BP 102/60 | HR 84 | Ht 66.0 in | Wt 232.8 lb

## 2021-05-26 DIAGNOSIS — I5042 Chronic combined systolic (congestive) and diastolic (congestive) heart failure: Secondary | ICD-10-CM | POA: Diagnosis not present

## 2021-05-26 DIAGNOSIS — I10 Essential (primary) hypertension: Secondary | ICD-10-CM | POA: Diagnosis not present

## 2021-05-26 DIAGNOSIS — I2510 Atherosclerotic heart disease of native coronary artery without angina pectoris: Secondary | ICD-10-CM | POA: Diagnosis not present

## 2021-05-26 DIAGNOSIS — I493 Ventricular premature depolarization: Secondary | ICD-10-CM | POA: Diagnosis not present

## 2021-05-26 MED ORDER — NITROGLYCERIN 0.4 MG SL SUBL: 0.4000 mg | SUBLINGUAL_TABLET | SUBLINGUAL | 3 refills | Status: DC

## 2021-05-26 NOTE — Patient Instructions (Signed)
Medication Instructions:  ?The current medical regimen is effective;  continue present plan and medications as directed. Please refer to the Current Medication list given to you today.  ? ?*If you need a refill on your cardiac medications before your next appointment, please call your pharmacy* ? ?Lab Work:   Testing/Procedures:  ?NONE    NONE ? ?Special Instructions ?CONTINUE DIET AND EXERCISE  ? ?Follow-Up: ?Your next appointment:  6 month(s) In Person with [name], MD ; KEEP SCHEDULED APPOINTMENT WITH DR [name] ? ?At [facility], you and your health needs are our priority.  As part of our continuing mission to provide you with exceptional heart care, we have created designated Provider Care Teams.  These Care Teams include your primary Cardiologist (physician) and Advanced Practice Providers (APPs -  Physician Assistants and Nurse Practitioners) who all work together to provide you with the care you need, when you need it. ? ? ? ?       ?

## 2021-05-26 NOTE — Telephone Encounter (Signed)
Please schedule an office visit prior to requesting any further refills. ?Requested Prescriptions  ?Pending Prescriptions Disp Refills  ?? VENTOLIN HFA 108 (90 Base) MCG/ACT inhaler [Pharmacy Med Name: VENTOLIN HFA INH W/DOS CTR 200PUFFS] 18 g 0  ?  Sig: INHALE 2 PUFFS BY MOUTH EVERY 6 HOURS AS NEEDED FOR WHEEZING OR SHORTNESS OF BREATH  ?  ? Pulmonology:  Beta Agonists 2 Passed - 05/26/2021  3:18 AM  ?  ?  Passed - Last BP in normal range  ?  BP Readings from Last 1 Encounters:  ?03/21/21 120/70  ?   ?  ?  Passed - Last Heart Rate in normal range  ?  Pulse Readings from Last 1 Encounters:  ?03/21/21 76  ?   ?  ?  Passed - Valid encounter within last 12 months  ?  Recent Outpatient Visits   ?      ? 9 months ago Type 2 diabetes mellitus with peripheral neuropathy ([name])  ? [facility] Marcine Matar, MD  ? 1 year ago Establishing care with new doctor, encounter for  ? [facility] Marcine Matar, MD  ?  ?  ?Future Appointments   ?        ? Today Cleaver, Thomasene Ripple, NP [facility]  ?  ? ?  ?  ?  ? ? ?

## 2021-05-30 ENCOUNTER — Ambulatory Visit (INDEPENDENT_AMBULATORY_CARE_PROVIDER_SITE_OTHER): Payer: Medicare Other | Admitting: Cardiovascular Disease

## 2021-05-30 ENCOUNTER — Other Ambulatory Visit: Payer: Self-pay

## 2021-05-30 ENCOUNTER — Encounter: Payer: Self-pay | Admitting: Cardiovascular Disease

## 2021-05-30 VITALS — BP 118/70 | HR 85 | Ht 66.0 in | Wt 234.0 lb

## 2021-05-30 DIAGNOSIS — E785 Hyperlipidemia, unspecified: Secondary | ICD-10-CM

## 2021-05-30 DIAGNOSIS — G4733 Obstructive sleep apnea (adult) (pediatric): Secondary | ICD-10-CM | POA: Diagnosis not present

## 2021-05-30 DIAGNOSIS — E1142 Type 2 diabetes mellitus with diabetic polyneuropathy: Secondary | ICD-10-CM

## 2021-05-30 DIAGNOSIS — I952 Hypotension due to drugs: Secondary | ICD-10-CM | POA: Diagnosis not present

## 2021-05-30 DIAGNOSIS — I5042 Chronic combined systolic (congestive) and diastolic (congestive) heart failure: Secondary | ICD-10-CM | POA: Diagnosis not present

## 2021-05-30 DIAGNOSIS — Z95810 Presence of automatic (implantable) cardiac defibrillator: Secondary | ICD-10-CM | POA: Diagnosis not present

## 2021-05-30 DIAGNOSIS — Z9989 Dependence on other enabling machines and devices: Secondary | ICD-10-CM

## 2021-05-30 MED ORDER — SPIRONOLACTONE 25 MG PO TABS: 12.5000 mg | ORAL_TABLET | ORAL | 3 refills | Status: DC

## 2021-05-30 NOTE — Progress Notes (Signed)
? ?Cardiology Office Note   ? ?Date:  06/04/2021  ? ?ID:  Jon Jones, DOB 1955/12/09, MRN [id number] ? ?Jon Jones:  Jon Jones, No  ?Cardiologist:  Jon Majestic, MD (sleep); Dr. Gardiner Jones ? ?44-month follow-up sleep evaluation ? ? ?History of Present Illness:  ?Jon Jones is a 66 y.o. male who is followed by Dr. Oswaldo Jones for his cardiology care.  I saw him for my initial evaluation with me on December 14, 2020 and last saw him on March 21, 2021.  He presents for 90-month follow-up evaluation. ? ?Jon Jones has known CAD and suffered a myocardial infarction in 2016.  Due to resultant ischemic cardiomyopathy he underwent ICD implantation.  He has a history of type 2 diabetes mellitus, PAD, hyperlipidemia, as well as obstructive sleep apnea.  He was previously followed with sleep medicine in [place] and now that he is in the [place] area he was referred to me to establish sleep medicine care in [place].  He had undergone a sleep study while living in [place] years ago and was told of stopping breathing approximately 90 times per hour.  He was initially set on CPAP therapy at maximal 20 cm of water.  Apparently his DME company was [employer].  He has not had any recent downloads.  Over the past month he has had problems with his mask fitting and he has noticed more leak.  He admits to 100% compliance with CPAP therapy.  He typically goes to bed between 11 PM and midnight and wakes up at 9 AM.  He has nocturia approximately 1-2 times per night but otherwise believes he is sleeping well.  He is on Medicaid for insurance.   ? ?When I saw him for my initial evaluation, I gave him a new ResMed AirFit F 30 mask which we had as a sample.  Since it has been many years since his initial evaluation and with his previous high pressure requirement I recommended a reevaluation prior to obtaining a new CPAP machine.  I discussed with him potential adverse consequences of untreated sleep apnea on his cardiovascular health and he  recognized the importance of continued use and continued excellent compliance. ? ?He underwent a split-night sleep study on February 10, 2021.  On the diagnostic portion of the study AHI was consistent with severe sleep apnea at 31.8.  However events were very severe during REM sleep with an AHI of 60.  He had significant oxygen desaturation to a nadir of 67%.  CPAP was initiated at 5 cm and was titrated to optimal PAP pressure of 12 cm of water her AHI was 0 and O2 nadir was 90%. ? ?Jon Jones received a new ResMed AirSense 11 AutoSet CPAP unit on March 10, 2021.  I saw him on March 21, 2021.  Over the past week he has been on therapy with excellent compliance.  Average use is 6 hours and 10 minutes per night.  AHI, however was increased at 9.3/h.  He states he typically goes to bed between 1030 and 11 PM and wakes up around 9 AM.  Oftentimes he goes to the bathroom at 5 AM and has not put the most mask back on.  He has not slept very well for the latter portion of the early morning.  During that evaluation, I discussed with him that the preponderance of REM sleep occurs in the second half of the night and since he has been taking his mask off when going to the bathroom and not putting  back on he was not on therapy will most likely need at the most.  With his AHI at 9.3/h, I changed his 12 cm set pressure to an auto mode range at 12 to 20 cm of water. ? ?Since I last saw him, he has been using CPAP.  However a download was obtained on his new machine from February 14 through May 25, 2021.  His average usage on days used was 6 hours and 29 minutes.  However usage days with his new machine was only 8 out of 30.  He states for the last 3 weeks he has been using his old machine which is why it is not registered on this download.  He believes his sleep has improved with increased duration of treatment.  He has experienced some episodes of lightheadedness.  He has been on Entresto 24/26 mg twice a day, metoprolol  succinate 100 mg daily, and spironolactone 12.5 mg daily.  He has continued to be on DAPT with aspirin and Plavix.  He is on Jardiance with his reduced LV function and glipizide and Ozempic for his diabetes.  He presents for evaluation. ? ? ?Past Medical History:  ?Diagnosis Date  ? 5 years ago   ? AICD (automatic cardioverter/defibrillator) present   ? Anxiety   ? Arthritis   ? Cataract   ? CHF (congestive heart failure) ([place])   ? Coronary artery disease   ? Diabetes mellitus without complication ([place])   ? Diabetic retinopathy ([place])   ? Dysrhythmia   ? GERD (gastroesophageal reflux disease)   ? PMH  ? Hypertension   ? Hypertensive retinopathy   ? Peripheral vascular disease ([place])   ? Peripheral vascular disease ([place])   ? Pneumonia   ? Shoulder impingement, right   ? Sleep apnea   ? wears cpap  ? Stomach ulcer   ? Wears glasses   ? ? ?Past Surgical History:  ?Procedure Laterality Date  ? AMPUTATION Left 03/12/2019  ? Procedure: 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;  Surgeon: [name], MD;  Location: [place];  Service: Orthopedics;  Laterality: Left;  ? AMPUTATION Right 10/01/2020  ? Procedure: RIGHT 2nd TOE AMPUTATION;  Surgeon: Felipa Furnace, DPM;  Location: [place];  Service: Podiatry;  Laterality: Right;  ? CARDIAC CATHETERIZATION    ? CARDIAC DEFIBRILLATOR PLACEMENT    ? CIRCUMCISION N/A 11/05/2020  ? Procedure: CIRCUMCISION ADULT;  Surgeon: Irine Seal, MD;  [facility];  Service: Urology;  Laterality: N/A;  ? I & D EXTREMITY Left 07/19/2016  ? Procedure: IRRIGATION AND DEBRIDEMENT EXTREMITY/OLECRANON(WASHOUT);  Surgeon: Leandrew Koyanagi, MD;  Location: [place];  Service: Orthopedics;  Laterality: Left;  ? OLECRANON BURSECTOMY Left 07/19/2016  ? Procedure: LEFT ELBOW OLECRANON BURSECTOMY;  Surgeon: Leandrew Koyanagi, MD;  Location: [place];  Service: Orthopedics;  Laterality: Left;  ? SHOULDER ARTHROSCOPY Right 05/16/2019  ? Procedure: Right Shoulder Arthroscopy;  Surgeon: [name], MD;  Location: [place];   Service: Orthopedics;  Laterality: Right;  ? ? ?Current Medications: ?Outpatient Medications Prior to Visit  ?Medication Sig Dispense Refill  ? ACCU-CHEK AVIVA PLUS test strip TEST 3 TIMES DAILY 100 strip 3  ? Accu-Chek Softclix Lancets lancets CHECK BLOOD SUGAR ONCE DAILY 100 each 2  ? acetaminophen (TYLENOL) 325 MG tablet Take 2 tablets (650 mg total) by mouth every 6 (six) hours as needed for mild pain (or Fever >/= 101).    ? aspirin 81 MG EC tablet Take 81 mg  by mouth daily.    ? atorvastatin (LIPITOR) 80 MG tablet Take 1 tablet (80 mg total) by mouth every evening. 90 tablet 3  ? ciclopirox (PENLAC) 8 % solution Apply topically at bedtime. Apply over nail and surrounding skin. Apply daily over previous coat. After seven (7) days, may remove with alcohol and continue cycle. 6.6 mL 3  ? clobetasol cream (TEMOVATE) [id number] % Apply 1 application topically 2 (two) times daily. 30 g 0  ? clopidogrel (PLAVIX) 75 MG tablet TAKE 1 TABLET BY MOUTH EVERY DAY 90 tablet 3  ? empagliflozin (JARDIANCE) 25 MG TABS tablet TAKE 1 TABLET(25 MG) BY MOUTH AT BEDTIME 90 tablet 0  ? gabapentin (NEURONTIN) 300 MG capsule TAKE 1 CAPSULE(300 MG) BY MOUTH AT BEDTIME 30 capsule 1  ? glipiZIDE (GLUCOTROL) 10 MG tablet TAKE 2 TABLETS(20 MG) BY MOUTH TWICE DAILY 360 tablet 2  ? metoprolol succinate (TOPROL-XL) 100 MG 24 hr tablet Take 1 tablet (100 mg total) by mouth daily. 90 tablet 3  ? Misc. Devices MISC Cpap supplies-tubing mask ?Dx G47.33 and Z99.89 1 Device 0  ? nitroGLYCERIN (NITROSTAT) 0.4 MG SL tablet Place 1 tablet (0.4 mg total) under the tongue See admin instructions. Every 5 minutes as needed for chest pain. Max of 3 doses 25 tablet 3  ? nystatin cream (MYCOSTATIN) SMARTSIG:Sparingly Topical 2-3 Times Daily PRN    ? OZEMPIC, 1 MG/DOSE, 4 MG/3ML SOPN Inject 1 mg into the skin once a week.    ? RESTASIS 0.05 % ophthalmic emulsion Place 1 drop into both eyes in the morning and at bedtime.    ? sacubitril-valsartan (ENTRESTO) 24-26 MG  Take 1 tablet by mouth 2 (two) times daily. 60 tablet 11  ? Semaglutide,0.25 or 0.5MG /DOS, (OZEMPIC, 0.25 OR 0.5 MG/DOSE,) 2 MG/1.5ML SOPN Inject 0.25 mg into the skin once a week.    ? sildenafil (VIAGRA) 100

## 2021-05-30 NOTE — Patient Instructions (Signed)
Medication Instructions:  ?DECREASE SPIRONOLACTONE 12.5MG  EVERY-OTHER-DAY ? ?*If you need a refill on your cardiac medications before your next appointment, please call your pharmacy* ? ?Lab Work:   Testing/Procedures:  ?NONE    NONE ? ?Follow-Up: ?Your next appointment:  12 month(s) In Person with DR Cherylann Parr APPT   ? ?Please call our office 2 months in advance to schedule this appointment  ? ?At [facility], you and your health needs are our priority.  As part of our continuing mission to provide you with exceptional heart care, we have created designated Provider Care Teams.  These Care Teams include your primary Cardiologist (physician) and Advanced Practice Providers (APPs -  Physician Assistants and Nurse Practitioners) who all work together to provide you with the care you need, when you need it. ? ? ?

## 2021-06-04 ENCOUNTER — Encounter: Payer: Self-pay | Admitting: Cardiovascular Disease

## 2021-06-07 ENCOUNTER — Ambulatory Visit (INDEPENDENT_AMBULATORY_CARE_PROVIDER_SITE_OTHER): Payer: Medicare Other

## 2021-06-07 DIAGNOSIS — I5042 Chronic combined systolic (congestive) and diastolic (congestive) heart failure: Secondary | ICD-10-CM

## 2021-06-07 LAB — CUP PACEART REMOTE DEVICE CHECK
Battery Remaining Longevity: 70 mo
Battery Voltage: 2.99 V
Brady Statistic AP VP Percent: 0 %
Brady Statistic AP VS Percent: 1.48 %
Brady Statistic AS VP Percent: 0.03 %
Brady Statistic AS VS Percent: 98.49 %
Brady Statistic RA Percent Paced: 1.48 %
Brady Statistic RV Percent Paced: 0.03 %
Date Time Interrogation Session: 20230327073827
HighPow Impedance: 91 Ohm
Implantable Lead Implant Date: 20180702
Implantable Lead Implant Date: 20180702
Implantable Lead Location: 753859
Implantable Lead Location: 753860
Implantable Lead Model: 5076
Implantable Pulse Generator Implant Date: 20180702
Lead Channel Impedance Value: 456 Ohm
Lead Channel Impedance Value: 456 Ohm
Lead Channel Impedance Value: 532 Ohm
Lead Channel Pacing Threshold Amplitude: 0.375 V
Lead Channel Pacing Threshold Amplitude: 0.5 V
Lead Channel Pacing Threshold Pulse Width: 0.4 ms
Lead Channel Pacing Threshold Pulse Width: 0.4 ms
Lead Channel Sensing Intrinsic Amplitude: 4 mV
Lead Channel Sensing Intrinsic Amplitude: 4 mV
Lead Channel Sensing Intrinsic Amplitude: 8.125 mV
Lead Channel Sensing Intrinsic Amplitude: 8.125 mV
Lead Channel Setting Pacing Amplitude: 1.5 V
Lead Channel Setting Pacing Amplitude: 2 V
Lead Channel Setting Pacing Pulse Width: 0.4 ms
Lead Channel Setting Sensing Sensitivity: 0.3 mV

## 2021-06-10 NOTE — Telephone Encounter (Signed)
error 

## 2021-06-13 ENCOUNTER — Ambulatory Visit: Payer: Self-pay | Admitting: General Practice

## 2021-06-18 ENCOUNTER — Other Ambulatory Visit: Payer: Self-pay | Admitting: Internal Medicine

## 2021-06-20 NOTE — Progress Notes (Signed)
Remote ICD transmission.   

## 2021-07-15 IMAGING — CT CT L SPINE W/ CM
1 of 6 series · 6 of 14 positions shown, 8 images · non-contrast
Comparison: Radiography 07/14/2019

CLINICAL DATA: Claudication with walking.
TECHNIQUE: Contiguous axial images were obtained through the Lumbar spine after
the intrathecal infusion of infusion. Coronal and sagittal
reconstructions were obtained of the axial image sets.

[Series 3: l spine soft · axial · 0.34mm/px · z∈[-408,-228]mm · 6 of 127 slices shown, 8 images]
[im 19/127  soft-tissue]
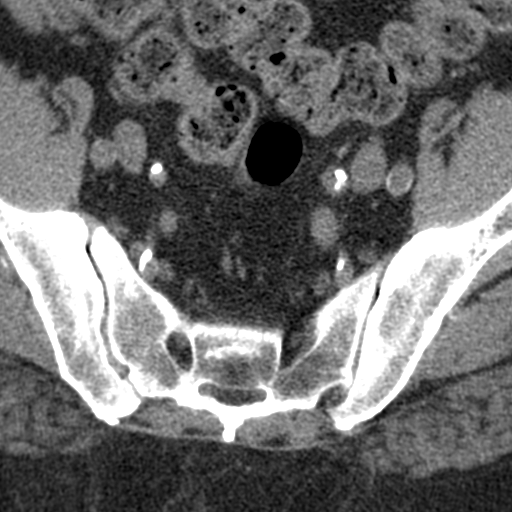
[im 19/127  bone]
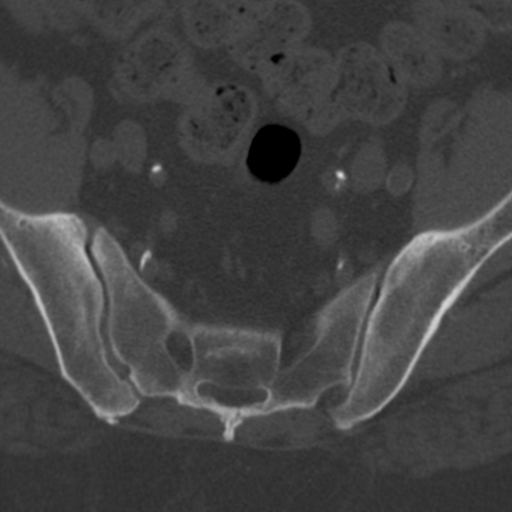
[im 37/127  bone]
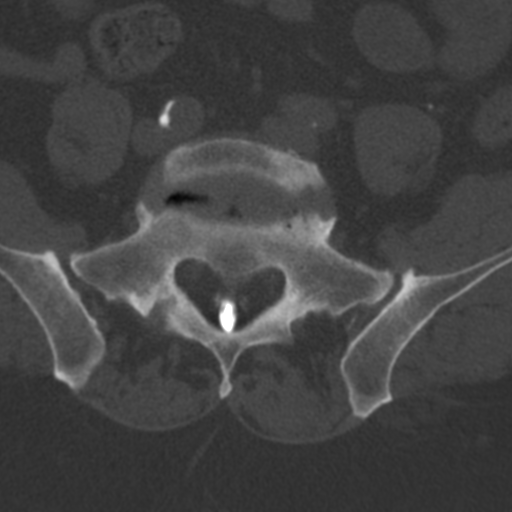
[im 55/127  bone]
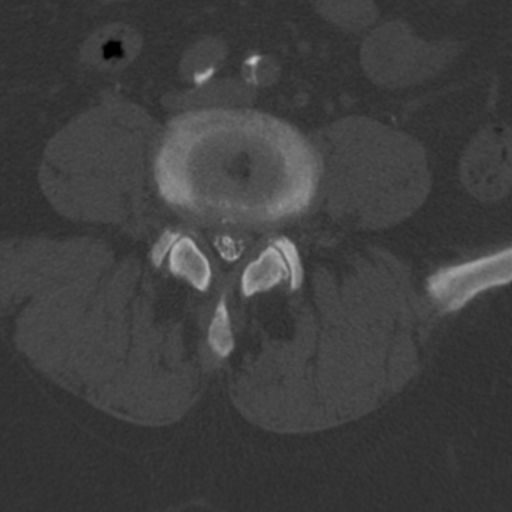
[im 73/127  bone]
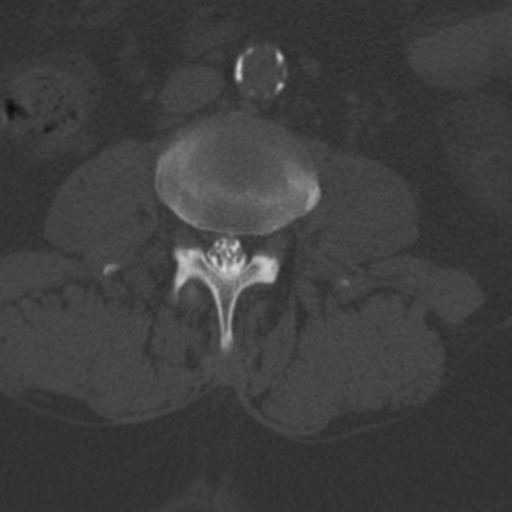
[im 91/127  soft-tissue]
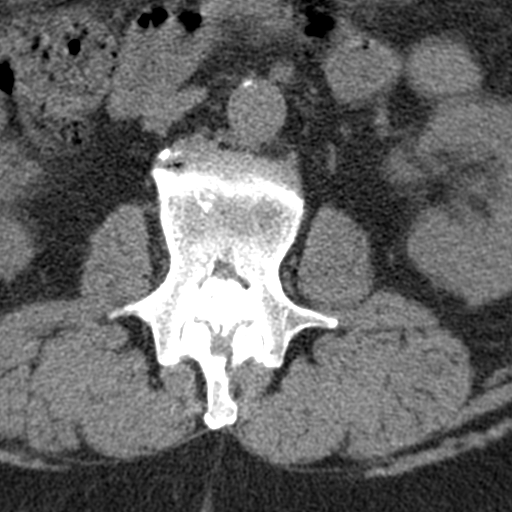
[im 91/127  bone]
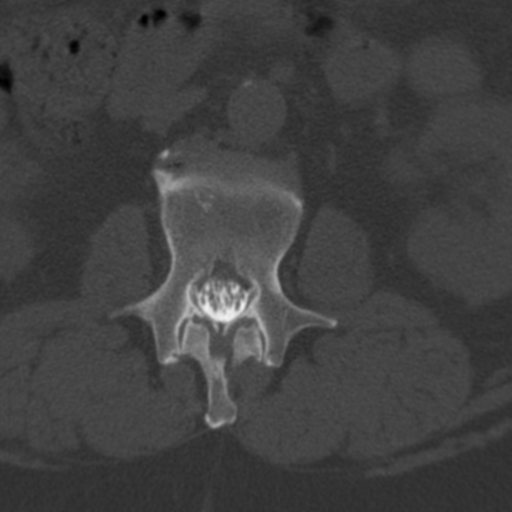
[im 109/127  bone]
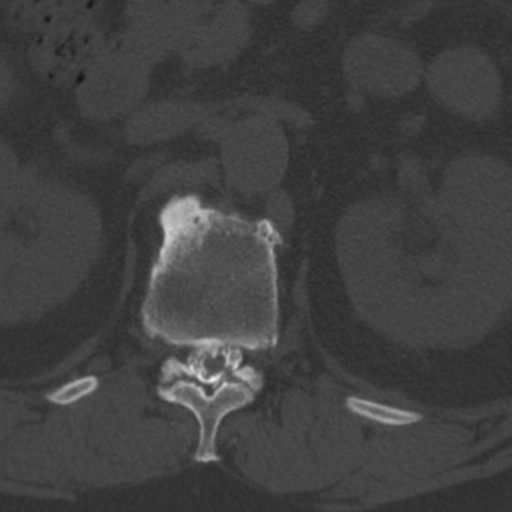

[6 of 14 positions shown; findings below may reference images not displayed]

EXAM:
LUMBAR MYELOGRAM

FLUOROSCOPY TIME:  2 minutes 4 seconds. 417.04 micro gray meter
squared

PROCEDURE:
After thorough discussion of risks and benefits of the procedure
including bleeding, infection, injury to nerves, blood vessels,
adjacent structures as well as headache and CSF leak, written and
oral informed consent was obtained. Consent was obtained by Dr. Imran Hasan
Kaptein. Time out form was completed.

Patient was positioned prone on the fluoroscopy table. Local
anesthesia was provided with 1% lidocaine without epinephrine after
prepped and draped in the usual sterile fashion. Puncture was
performed at L2-3 using a 5 inch 22-gauge spinal needle via left
paramedian approach. Using a single pass through the dura, the
needle was placed within the thecal sac, with return of clear CSF.
12 cc of Isovue S-[id number] was injected into the thecal sac, with normal
opacification of the nerve roots and cauda equina consistent with
free flow within the subarachnoid space.

I personally performed the lumbar puncture and administered the
intrathecal contrast. I also personally performed acquisition of the
myelogram images.
FINDINGS: LUMBAR MYELOGRAM FINDINGS:

Mild canal narrowing at the L4-5 level. At L5-S1, there is 5 mm of
anterolisthesis which increases to 8 mm with standing and flexion.
There is multifactorial spinal stenosis at this level. Some vacuum
phenomenon noted within the disc space. Pronounced bilateral facet
arthropathy.

CT LUMBAR MYELOGRAM FINDINGS:

T12-L1: Solid bridging anterior osteophytes. Annular bulging and
calcification. Facet and ligamentous hypertrophy. Mild canal
stenosis but without distal cord compression. Right foraminal
encroachment by osteophyte could possibly affect the right T12
nerve.

L1-2: Minimal disc bulge.  No stenosis or neural compression.

L2-3: Normal interspace.

L3-4: Mild bulging of the disc towards the left. Mild facet and
ligamentous hypertrophy. Mild narrowing of the left lateral recess
but without likely neural compression.

L4-5: Disc degeneration with vacuum phenomenon. Circumferential
protrusion of the disc. Facet and ligamentous hypertrophy. Moderate
multifactorial spinal stenosis which could possibly be symptomatic.

L5-S1: Advanced bilateral facet arthropathy with 5 mm of
anterolisthesis. This is known to increase to 8 mm with standing and
flexion. Complex synovial cyst arising from the facet joint on the
left within the posterior left side of the spinal canal. Disc
degeneration with vacuum phenomenon and bulging. Marked constriction
of the thecal sac due to these processes in combination with
abundant epidural fat. Severe bilateral foraminal stenosis could
compress either or both exiting L5 nerves.

Bilateral sacroiliac osteoarthritis is noted.

The patient does have atherosclerotic calcification of the aorta and
iliac arteries but without apparent severe calcific stenosis.
IMPRESSION: 1. L5-S1: Severe multifactorial spinal stenosis. Advanced bilateral
facet arthropathy with 5 mm of anterolisthesis which increases to 8
mm with standing and flexion. Bulging of the disc. Complex synovial
cyst arising from the facet joint on the left within the posterior
left side of the spinal canal. Marked constriction of the thecal sac
due to these processes in combination with abundant epidural fat.
Severe bilateral foraminal stenosis could compress either or both
exiting L5 nerves.
2. L4-5: Moderate multifactorial spinal stenosis.
3. T12-L1: Right foraminal encroachment by osteophyte could possibly
affect the right T12 nerve.

Aortic Atherosclerosis ([id number]-VMX.X).

## 2021-07-20 ENCOUNTER — Ambulatory Visit (INDEPENDENT_AMBULATORY_CARE_PROVIDER_SITE_OTHER): Payer: Medicare Other | Admitting: Family

## 2021-07-20 ENCOUNTER — Encounter: Payer: Self-pay | Admitting: Family

## 2021-07-20 DIAGNOSIS — M1712 Unilateral primary osteoarthritis, left knee: Secondary | ICD-10-CM

## 2021-07-20 DIAGNOSIS — M75121 Complete rotator cuff tear or rupture of right shoulder, not specified as traumatic: Secondary | ICD-10-CM | POA: Diagnosis not present

## 2021-07-20 MED ORDER — LIDOCAINE HCL 1 % IJ SOLN
5.0000 mL | INTRAMUSCULAR | Status: AC | PRN
  Administered 2021-07-20: 5 mL

## 2021-07-20 MED ORDER — METHYLPREDNISOLONE ACETATE 40 MG/ML IJ SUSP
40.0000 mg | INTRAMUSCULAR | Status: AC | PRN
  Administered 2021-07-20: 40 mg via INTRA_ARTICULAR

## 2021-07-20 NOTE — Progress Notes (Signed)
? ?Office Visit Note ?  ?Patient: Jon KeensOmar Jones           ?Date of Birth: 12/22/1955           ?MRN: [id number] ?Visit Date: 07/20/2021 ?             ?Requested by: No referring provider defined for this encounter. ?PCP: Pcp, No ? ?Chief Complaint  ?Patient presents with  ? Left Knee - Pain  ? Right Shoulder - Pain  ? ? ? ? ?HPI: ?The patient is a 66 year old gentleman seen today for 2 separate issues ? ?He is seen in follow-up for his right shoulder he is currently awaiting a reverse total shoulder with Dr. August Saucerean but is working on bringing his A1c down to 8 or below.  At last check 9 months ago this was 8.9.  Today he states he is not ready for us to recheck his A1c he is currently been working on a new exercise program for the last 4 weeks and would like to wait 2 more months before checking his A1c ? ?He is complaining of chronic pain loss of range of motion especially reaching above head some weakness as well he is right-hand dominant ? ?He has noticed since he has begun back at the gym his left knee has been giving him more trouble he has been having mechanical symptoms locking catching giving way significant pain when he is doing any of the leg machines ? ?He has had good relief with Depo-Medrol injections of both joints in the past is requesting repeat injection today ? ?Assessment & Plan: ?Visit Diagnoses: No diagnosis found. ? ?Plan: Depo-Medrol injection right shoulder and left knee.  Patient tolerated well. ? ?Follow-Up Instructions: Return if symptoms worsen or fail to improve.  ? ?Left Knee Exam  ? ?Muscle Strength  ?The patient has normal left knee strength. ? ?Tenderness  ?The patient is experiencing tenderness in the medial joint line. ? ?Range of Motion  ?The patient has normal left knee ROM. ? ?Tests  ?Varus: negative Valgus: negative ? ?Other  ?Erythema: absent ?Swelling: none ? ?Comments:  Crepitation with rom ? ? ? ? ?Patient is alert, oriented, no adenopathy, well-dressed, normal affect, normal  respiratory effort. ?Examination patient has abduction and flexion to about 90 degrees of the right shoulder there is crepitation with range of motion he has good internal and external rotation passively range of motion to 120 degrees no adhesive capsulitis.   ? ?Imaging: ?No results found. ?No images are attached to the encounter. ? ?Labs: ?Lab Results  ?Component Value Date  ? HGBA1C 8.7 (H) 10/19/2020  ? HGBA1C 9.8 (H) 09/08/2020  ? HGBA1C 11.0 (A) 07/23/2020  ? ESRSEDRATE 11 03/11/2019  ? CRP 1.8 (H) 03/11/2019  ? REPTSTATUS 10/04/2020 FINAL 09/29/2020  ? GRAMSTAIN  07/19/2016  ?  RARE WBC PRESENT, PREDOMINANTLY MONONUCLEAR ?NO ORGANISMS SEEN ?  ? CULT  09/29/2020  ?  NO GROWTH 5 DAYS ?Performed at [facility], [address] [place], [place] [address] ?  ? ? ? ?Lab Results  ?Component Value Date  ? ALBUMIN 3.5 09/29/2020  ? ALBUMIN 4.2 04/26/2020  ? ALBUMIN 3.7 03/11/2019  ? ? ?No results found for: MG ?No results found for: VD25OH ? ?No results found for: PREALBUMIN ? ?  Latest Ref Rng & Units 10/19/2020  ? 10:59 AM 10/01/2020  ?  5:08 AM 09/30/2020  ?  4:56 AM  ?CBC EXTENDED  ?WBC 4.0 - 10.5 K/uL 8.0  7.4   8.4    ?RBC 4.22 - 5.81 MIL/uL 4.45   4.32   3.99    ?Hemoglobin 13.0 - 17.0 g/dL 93.2   67.1   24.5    ?HCT 39.0 - 52.0 % 42.3   40.1   36.6    ?Platelets 150 - 400 K/uL 183   202   170    ? ? ? ?There is no height or weight on file to calculate BMI. ? ?Orders:  ?No orders of the defined types were placed in this encounter. ? ?No orders of the defined types were placed in this encounter. ? ? ? Procedures: ?Large Joint Inj: R subacromial bursa on 07/20/2021 9:05 AM ?Indications: pain ?Details: 22 G 1.5 in needle ?Medications: 5 mL lidocaine 1 %; 40 mg methylPREDNISolone acetate 40 MG/ML ?Consent was given by the patient.  ? ? ?Large Joint Inj: L knee on 07/20/2021 9:05 AM ?Indications: pain ?Details: 18 G 1.5 in needle, anteromedial approach ?Medications: 5 mL lidocaine 1 %; 40 mg methylPREDNISolone  acetate 40 MG/ML ?Consent was given by the patient.  ? ? ? ?Clinical Data: ?No additional findings. ? ?ROS: ? ?All other systems negative, except as noted in the HPI. ?Review of Systems ? ?Objective: ?Vital Signs: There were no vitals taken for this visit. ? ?Specialty Comments:  ?No specialty comments available. ? ?PMFS History: ?Patient Active Problem List  ? Diagnosis Date Noted  ? Pain due to onychomycosis of toenails of both feet 01/21/2021  ? Phimosis of penis 10/06/2020  ? Cellulitis 09/29/2020  ? Cellulitis of right toe 09/29/2020  ? Chronic combined systolic and diastolic heart failure ([name]) 09/15/2020  ? Onychomycosis of toenail 08/24/2020  ? Diabetes mellitus ([name]) 07/23/2020  ? Type 2 diabetes mellitus with hyperglycemia, without long-term current use of insulin ([name]) 07/23/2020  ? Weight gain 07/23/2020  ? Type 2 diabetes mellitus with peripheral neuropathy ([name]) 07/23/2020  ? 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 diabetes mellitus ([name]) 05/04/2020  ? Hypertensive heart disease with heart failure ([name]) 04/26/2020  ? Type 2 diabetes mellitus with diabetic peripheral angiopathy without gangrene, without long-term current use of insulin ([name]) 04/26/2020  ? OSA on CPAP 04/26/2020  ? PAD (peripheral artery disease) ([name]) 04/26/2020  ? CAD in native artery 04/26/2020  ? Class 2 severe obesity due to excess calories with serious comorbidity and body mass index (BMI) of 38.0 to 38.9 in adult [facility]) 04/26/2020  ? Impingement syndrome of right shoulder   ? Nontraumatic complete tear of right rotator cuff   ? Type 2 diabetes mellitus with foot ulcer, without long-term current use of insulin ([name])   ? 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 S/P debridement 07/19/2016  ? Acute gout of left elbow 07/19/2016  ? ?Past Medical History:  ?Diagnosis Date  ? 5 years ago   ? AICD (automatic cardioverter/defibrillator) present   ? Anxiety   ? Arthritis   ? Cataract   ? CHF (congestive heart failure) ([name])   ?  Coronary artery disease   ? Diabetes mellitus without complication ([name])   ? Diabetic retinopathy ([name])   ? Dysrhythmia   ? GERD (gastroesophageal reflux disease)   ? PMH  ? Hypertension   ? Hypertensive retinopathy   ? Peripheral vascular disease ([name])   ? Peripheral vascular disease ([name])   ? Pneumonia   ? Shoulder impingement, right   ? Sleep apnea   ? wears cpap  ? Stomach ulcer   ?  Wears glasses   ?  ?Family History  ?Problem Relation Age of Onset  ? Heart disease Mother   ? Diabetes Mother   ? Peripheral vascular disease Mother   ?  ?Past Surgical History:  ?Procedure Laterality Date  ? AMPUTATION Left 03/12/2019  ? Procedure: 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;  Surgeon: [name], MD;  Location: [facility] OR;  Service: Orthopedics;  Laterality: Left;  ? AMPUTATION Right 10/01/2020  ? Procedure: RIGHT 2nd TOE AMPUTATION;  Surgeon: [name], DPM;  Location: [facility] OR;  Service: Podiatry;  Laterality: Right;  ? CARDIAC CATHETERIZATION    ? CARDIAC DEFIBRILLATOR PLACEMENT    ? CIRCUMCISION N/A 11/05/2020  ? Procedure: CIRCUMCISION ADULT;  Surgeon: [name], MD;  Location: [facility] OR[facility];  Service: Urology;  Laterality: N/A;  ? I & D EXTREMITY Left 07/19/2016  ? Procedure: IRRIGATION AND DEBRIDEMENT EXTREMITY/OLECRANON(WASHOUT);  Surgeon: Tarry Kos, MD;  Location: [facility] OR;  Service: Orthopedics;  Laterality: Left;  ? OLECRANON BURSECTOMY Left 07/19/2016  ? Procedure: LEFT ELBOW OLECRANON BURSECTOMY;  Surgeon: Tarry Kos, MD;  Location: [facility];  Service: Orthopedics;  Laterality: Left;  ? SHOULDER ARTHROSCOPY Right 05/16/2019  ? Procedure: Right Shoulder Arthroscopy;  Surgeon: [name], MD;  Location: [facility] OR;  Service: Orthopedics;  Laterality: Right;  ? ?Social History  ? ?Occupational History  ? Not on file  ?Tobacco Use  ? Smoking status: Former  ?  Packs/day: 1.00  ?  Years: 40.00  ?  Pack years: 40.00  ?  Types: Cigarettes  ?  Quit date: 03/11/2019  ?  Years since quitting: 2.3  ? Smokeless tobacco:  Never  ?Vaping Use  ? Vaping Use: Never used  ?Substance and Sexual Activity  ? Alcohol use: Not Currently  ? Drug use: Not Currently  ?  Types: Oxycodone  ? Sexual activity: Not on file  ? ? ? ? ? ?

## 2021-07-25 ENCOUNTER — Encounter: Payer: Self-pay | Admitting: Cardiovascular Disease

## 2021-07-25 ENCOUNTER — Ambulatory Visit (INDEPENDENT_AMBULATORY_CARE_PROVIDER_SITE_OTHER): Payer: Medicare Other | Admitting: Cardiovascular Disease

## 2021-07-25 VITALS — BP 102/68 | HR 76 | Ht 66.0 in | Wt 232.4 lb

## 2021-07-25 DIAGNOSIS — Z95810 Presence of automatic (implantable) cardiac defibrillator: Secondary | ICD-10-CM

## 2021-07-25 DIAGNOSIS — I2510 Atherosclerotic heart disease of native coronary artery without angina pectoris: Secondary | ICD-10-CM

## 2021-07-25 DIAGNOSIS — E785 Hyperlipidemia, unspecified: Secondary | ICD-10-CM

## 2021-07-25 DIAGNOSIS — E1151 Type 2 diabetes mellitus with diabetic peripheral angiopathy without gangrene: Secondary | ICD-10-CM

## 2021-07-25 DIAGNOSIS — I5042 Chronic combined systolic (congestive) and diastolic (congestive) heart failure: Secondary | ICD-10-CM

## 2021-07-25 NOTE — Patient Instructions (Signed)
Medication Instructions:  No changes *If you need a refill on your cardiac medications before your next appointment, please call your pharmacy*   Lab Work: None ordered If you have labs (blood work) drawn today and your tests are completely normal, you will receive your results only by: MyChart Message (if you have MyChart) OR A paper copy in the mail If you have any lab test that is abnormal or we need to change your treatment, we will call you to review the results.   Testing/Procedures: None ordered   Follow-Up: At [facility], you and your health needs are our priority.  As part of our continuing mission to provide you with exceptional heart care, we have created designated Provider Care Teams.  These Care Teams include your primary Cardiologist (physician) and Advanced Practice Providers (APPs -  Physician Assistants and Nurse Practitioners) who all work together to provide you with the care you need, when you need it.  We recommend signing up for the patient portal called "MyChart".  Sign up information is provided on this After Visit Summary.  MyChart is used to connect with patients for Virtual Visits (Telemedicine).  Patients are able to view lab/test results, encounter notes, upcoming appointments, etc.  Non-urgent messages can be sent to your provider as well.   To learn more about what you can do with MyChart, go to [email or web address].    Your next appointment:   12 month(s)  The format for your next appointment:   In Person  Provider:   Dr. Croitoru  Important Information About Sugar       

## 2021-07-25 NOTE — Progress Notes (Signed)
?Cardiology Office Note:   ? ?Date:  07/25/2021  ? ?ID:  Jon Jones, DOB [birth year], MRN [id number] ? ?PCP:  Pcp, No ?  ?[facility] Providers ?Cardiologist:  Donato Heinz, [place] ?Click to update primary MD,subspecialty MD or APP then REFRESH:1}   ? ?Referring MD: No ref. provider found  ? ?No chief complaint on file. ?ICD ? ?History of Present Illness:   ? ?Jon Jones is a 66 y.o. male with a hx of severe ischemic cardiomyopathy with LVEF 35% following delayed presentation with an extensive anterior wall myocardial infarction in 2016 (previous inferior myocardial infarction in 2015), status postimplantation of a dual-chamber ICD in 2018 at [facility]) now relocated to [place].  He is seeing Dr. Oswaldo Milian and Dr. Claiborne Billings in sleep clinic.  Additional medical problems include obesity, type 2 diabetes mellitus, essential hypertension, hyperlipidemia, OSA on CPAP, PAD. ? ?He has had an uneventful year.  He has not had syncope, palpitations or defibrillator discharges.  He denies problems with exertional angina.  He has occasional swelling in his legs, but none today.  He has NYHA functional class II exertional dyspnea, unchanged. ? ?He misunderstood instructions when he started Entresto: He stopped atorvastatin as well as lisinopril.  Clarified today that he needs to resume the atorvastatin. ? ?ICD is functioning normally.  Anticipated generator longevity is 5.6 years.  All lead parameters are stable and in normal range.  His device has never detected or treated sustained VT/VF.  He has not had any episodes of nonsustained VT or atrial fibrillation since his last device download.  He does not require pacing (1.3% atrial pacing, less than 0.1% ventricular pacing).  He has episodic increases of his OptiVol, including in the last week or so.  He told me that he forgot to take his medications for couple of days.  He denies any problems with dyspnea at this time. ? ?His previous  angina was described as heaviness in left side of his chest radiating to the left arm. ? ? ?Past Medical History:  ?Diagnosis Date  ? 5 years ago   ? AICD (automatic cardioverter/defibrillator) present   ? Anxiety   ? Arthritis   ? Cataract   ? CHF (congestive heart failure) ([place])   ? Coronary artery disease   ? Diabetes mellitus without complication ([name])   ? Diabetic retinopathy ([name])   ? Dysrhythmia   ? GERD (gastroesophageal reflux disease)   ? PMH  ? Hypertension   ? Hypertensive retinopathy   ? Peripheral vascular disease ([place])   ? Peripheral vascular disease ([place])   ? Pneumonia   ? Shoulder impingement, right   ? Sleep apnea   ? wears cpap  ? Stomach ulcer   ? Wears glasses   ? ? ?Past Surgical History:  ?Procedure Laterality Date  ? AMPUTATION Left 03/12/2019  ? Procedure: 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;  Surgeon: [name], MD;  Location: [name];  Service: Orthopedics;  Laterality: Left;  ? AMPUTATION Right 10/01/2020  ? Procedure: RIGHT 2nd TOE AMPUTATION;  Surgeon: Felipa Furnace, DPM;  Location: [name];  Service: Podiatry;  Laterality: Right;  ? CARDIAC CATHETERIZATION    ? CARDIAC DEFIBRILLATOR PLACEMENT    ? CIRCUMCISION N/A 11/05/2020  ? Procedure: CIRCUMCISION ADULT;  Surgeon: Irine Seal, MD;  Location: [facility];  Service: Urology;  Laterality: N/A;  ? I & D EXTREMITY Left 07/19/2016  ? Procedure: IRRIGATION AND DEBRIDEMENT EXTREMITY/OLECRANON(WASHOUT);  Surgeon: Leandrew Koyanagi, MD;  Location:  [name] OR;  Service: Orthopedics;  Laterality: Left;  ? OLECRANON BURSECTOMY Left 07/19/2016  ? Procedure: LEFT ELBOW OLECRANON BURSECTOMY;  Surgeon: Leandrew Koyanagi, MD;  Location: [name];  Service: Orthopedics;  Laterality: Left;  ? SHOULDER ARTHROSCOPY Right 05/16/2019  ? Procedure: Right Shoulder Arthroscopy;  Surgeon: [name], MD;  Location: [place];  Service: Orthopedics;  Laterality: Right;  ? ? ?Current Medications: ?Current Meds  ?Medication Sig  ? ACCU-CHEK AVIVA PLUS test strip TEST 3 TIMES DAILY   ? Accu-Chek Softclix Lancets lancets CHECK BLOOD SUGAR ONCE DAILY  ? aspirin 81 MG EC tablet Take 81 mg by mouth daily.  ? clobetasol cream (TEMOVATE) [id number] % Apply 1 application topically 2 (two) times daily.  ? clopidogrel (PLAVIX) 75 MG tablet TAKE 1 TABLET BY MOUTH EVERY DAY  ? empagliflozin (JARDIANCE) 25 MG TABS tablet TAKE 1 TABLET(25 MG) BY MOUTH AT BEDTIME  ? gabapentin (NEURONTIN) 300 MG capsule TAKE 1 CAPSULE(300 MG) BY MOUTH AT BEDTIME  ? glipiZIDE (GLUCOTROL) 10 MG tablet TAKE 2 TABLETS(20 MG) BY MOUTH TWICE DAILY  ? JANUMET XR [id number] MG TB24 Take 1 tablet by mouth daily.  ? metoprolol succinate (TOPROL-XL) 100 MG 24 hr tablet Take 1 tablet (100 mg total) by mouth daily.  ? Misc. Devices MISC Cpap supplies-tubing mask ?Dx [id number] and Z99.89  ? nystatin cream (MYCOSTATIN) SMARTSIG:Sparingly Topical 2-3 Times Daily PRN  ? OZEMPIC, 1 MG/DOSE, 4 MG/3ML SOPN Inject 1 mg into the skin once a week.  ? RESTASIS 0.05 % ophthalmic emulsion Place 1 drop into both eyes in the morning and at bedtime.  ? sacubitril-valsartan (ENTRESTO) 24-26 MG Take 1 tablet by mouth 2 (two) times daily.  ? Semaglutide,0.25 or 0.5MG /DOS, (OZEMPIC, 0.25 OR 0.5 MG/DOSE,) 2 MG/1.5ML SOPN Inject 0.25 mg into the skin once a week.  ? spironolactone (ALDACTONE) 25 MG tablet Take 0.5 tablets (12.5 mg total) by mouth every other day.  ? triamcinolone ointment (KENALOG) 0.1 % SMARTSIG:sparingly Topical Twice Daily  ? VENTOLIN HFA 108 (90 Base) MCG/ACT inhaler INHALE 2 PUFFS BY MOUTH EVERY 6 HOURS AS NEEDED FOR WHEEZING OR SHORTNESS OF BREATH  ?  ? ?Allergies:   Patient has no known allergies.  ? ?Social History  ? ?Socioeconomic History  ? Marital status: Married  ?  Spouse name: Not on file  ? Number of children: Not on file  ? Years of education: Not on file  ? Highest education level: Not on file  ?Occupational History  ? Not on file  ?Tobacco Use  ? Smoking status: Former  ?  Packs/day: 1.00  ?  Years: 40.00  ?  Pack years: 40.00  ?   Types: Cigarettes  ?  Quit date: 03/11/2019  ?  Years since quitting: 2.3  ? Smokeless tobacco: Never  ?Vaping Use  ? Vaping Use: Never used  ?Substance and Sexual Activity  ? Alcohol use: Not Currently  ? Drug use: Not Currently  ?  Types: Oxycodone  ? Sexual activity: Not on file  ?Other Topics Concern  ? Not on file  ?Social History Narrative  ? Not on file  ? ?Social Determinants of Health  ? ?Financial Resource Strain: Not on file  ?Food Insecurity: Not on file  ?Transportation Needs: Not on file  ?Physical Activity: Not on file  ?Stress: Not on file  ?Social Connections: Not on file  ?  ? ?Family History: ?The patient's family history includes Diabetes in his mother; Heart disease in  his mother; Peripheral vascular disease in his mother. ? ?ROS:   ?Please see the history of present illness.    ? All other systems reviewed and are negative. ? ?EKGs/Labs/Other Studies Reviewed:   ? ?The following studies were reviewed today: ?Notes from Dr. Claiborne Billings and Coletta Memos, NP ? ?EKG:  EKG is not ordered today.  The ekg ordered 03/21/2021 shows normal sinus rhythm with inferior Q waves, unchanged. ?Recent Labs: ?09/29/2020: ALT 22 ?10/19/2020: Hemoglobin 13.2; Platelets 183 ?01/19/2021: BUN 18; Creatinine, Ser 1.08; Potassium 4.6; Sodium 138  ?Recent Lipid Panel ?   ?Component Value Date/Time  ? CHOL 128 01/19/2021 0946  ? TRIG 199 (H) 01/19/2021 0946  ? HDL 33 (L) 01/19/2021 0946  ? CHOLHDL 3.9 01/19/2021 0946  ? [place] 62 01/19/2021 0946  ? ? ? ?Risk Assessment/Calculations:   ?  ? ? ?Physical Exam:   ? ?VS:  BP 102/68 (BP Location: Left Arm, Patient Position: Sitting, Cuff Size: Large)   Pulse 76   Ht 5\' 6"  (1.676 m)   Wt 232 lb 6.4 oz (105.4 kg)   SpO2 95%   BMI 37.51 kg/m?    ? ?Wt Readings from Last 3 Encounters:  ?07/25/21 232 lb 6.4 oz (105.4 kg)  ?05/30/21 234 lb (106.1 kg)  ?05/26/21 232 lb 12.8 oz (105.6 kg)  ?  ? ?General: Alert, oriented x3, no distress, healthy left subclavian ICD site.  Obese. ?Head: no  evidence of trauma, PERRL, EOMI, no exophtalmos or lid lag, no myxedema, no xanthelasma; normal ears, nose and oropharynx ?Neck: normal jugular venous pulsations and no hepatojugular reflux; brisk carotid puls

## 2021-07-27 ENCOUNTER — Encounter: Payer: Self-pay | Admitting: Podiatry

## 2021-07-27 ENCOUNTER — Ambulatory Visit (INDEPENDENT_AMBULATORY_CARE_PROVIDER_SITE_OTHER): Payer: Medicare Other | Admitting: Podiatry

## 2021-07-27 DIAGNOSIS — M79674 Pain in right toe(s): Secondary | ICD-10-CM

## 2021-07-27 DIAGNOSIS — I739 Peripheral vascular disease, unspecified: Secondary | ICD-10-CM

## 2021-07-27 DIAGNOSIS — B351 Tinea unguium: Secondary | ICD-10-CM | POA: Diagnosis not present

## 2021-07-27 DIAGNOSIS — Z89421 Acquired absence of other right toe(s): Secondary | ICD-10-CM

## 2021-07-27 DIAGNOSIS — M79675 Pain in left toe(s): Secondary | ICD-10-CM

## 2021-07-27 DIAGNOSIS — E1159 Type 2 diabetes mellitus with other circulatory complications: Secondary | ICD-10-CM

## 2021-07-27 NOTE — Progress Notes (Deleted)
nba

## 2021-07-27 NOTE — Progress Notes (Signed)
This patient returns to my office for at risk foot care.  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  This patient presents for at risk foot care today.  General Appearance  Alert, conversant and in no acute stress.  Vascular  Dorsalis pedis and posterior tibial  pulses are palpable  bilaterally.  Capillary return is within normal limits  bilaterally. Temperature is within normal limits  bilaterally.  Neurologic  Senn-Weinstein monofilament wire test within normal limits  bilaterally. Muscle power within normal limits bilaterally.  Nails Thick disfigured discolored nails with subungual debris  from  1-4  toes left. and 1,3-5 toes right. . No evidence of bacterial infection or drainage bilaterally.  Orthopedic  No limitations of motion  feet .  No crepitus or effusions noted.  No bony pathology or digital deformities noted.  Skin  normotropic skin with no porokeratosis noted bilaterally.  No signs of infections or ulcers noted.     Onychomycosis  Pain in right toes  Pain in left toes  Consent was obtained for treatment procedures.   Mechanical debridement of nails 1-4 left and 1,3-5 right performed with a nail nipper.  Filed with dremel without incident.    Return office visit   3 months                   Told patient to return for periodic foot care and evaluation due to potential at risk complications.   Reniya Mcclees DPM   

## 2021-09-02 ENCOUNTER — Other Ambulatory Visit: Payer: Self-pay | Admitting: General Practice

## 2021-09-06 ENCOUNTER — Ambulatory Visit (INDEPENDENT_AMBULATORY_CARE_PROVIDER_SITE_OTHER): Payer: Medicaid Other

## 2021-09-06 DIAGNOSIS — Z95810 Presence of automatic (implantable) cardiac defibrillator: Secondary | ICD-10-CM

## 2021-09-07 LAB — CUP PACEART REMOTE DEVICE CHECK
Battery Remaining Longevity: 63 mo
Battery Voltage: 2.98 V
Brady Statistic AP VP Percent: 0 %
Brady Statistic AP VS Percent: 0.24 %
Brady Statistic AS VP Percent: 0.03 %
Brady Statistic AS VS Percent: 99.73 %
Brady Statistic RA Percent Paced: 0.24 %
Brady Statistic RV Percent Paced: 0.03 %
Date Time Interrogation Session: 20230626042205
HighPow Impedance: 77 Ohm
Implantable Lead Implant Date: 20180702
Implantable Lead Implant Date: 20180702
Implantable Lead Location: 753859
Implantable Lead Location: 753860
Implantable Lead Model: 5076
Implantable Pulse Generator Implant Date: 20180702
Lead Channel Impedance Value: 380 Ohm
Lead Channel Impedance Value: 399 Ohm
Lead Channel Impedance Value: 494 Ohm
Lead Channel Pacing Threshold Amplitude: 0.375 V
Lead Channel Pacing Threshold Amplitude: 0.625 V
Lead Channel Pacing Threshold Pulse Width: 0.4 ms
Lead Channel Pacing Threshold Pulse Width: 0.4 ms
Lead Channel Sensing Intrinsic Amplitude: 3.375 mV
Lead Channel Sensing Intrinsic Amplitude: 3.375 mV
Lead Channel Sensing Intrinsic Amplitude: 5.625 mV
Lead Channel Sensing Intrinsic Amplitude: 5.625 mV
Lead Channel Setting Pacing Amplitude: 1.5 V
Lead Channel Setting Pacing Amplitude: 2 V
Lead Channel Setting Pacing Pulse Width: 0.4 ms
Lead Channel Setting Sensing Sensitivity: 0.3 mV

## 2021-09-22 NOTE — Progress Notes (Incomplete)
[facility] - Clinic Note  10/04/2021     CHIEF COMPLAINT Patient presents for Retina Follow Up  HISTORY OF PRESENT ILLNESS: Jon Jones is a 66 y.o. male who presents to the clinic today for:   HPI     Retina Follow Up           Diagnosis: Diabetic Retinopathy   Laterality: both eyes   Severity: moderate   Duration: 6 months   Course: stable         Comments   Patient states vision worse OU. Last a1c was 8.1, checked about 2 months ago. Using Restasis bid OU.      Last edited by Doreene Nest, COT on 10/04/2021  9:33 AM.     Patient states that the vision has decreased in the last 2-3 months.  Referring physician: Alma Downs, PA-C Marcine Matar, MD [address] [place],  [place] [address]  HISTORICAL INFORMATION:   Selected notes from the MEDICAL RECORD NUMBER Referred by Alma Downs, PA for eval of diabetic retinopathy OU; DME OS   CURRENT MEDICATIONS: Current Outpatient Medications (Ophthalmic Drugs)  Medication Sig   RESTASIS 0.05 % ophthalmic emulsion Place 1 drop into both eyes in the morning and at bedtime.   No current facility-administered medications for this visit. (Ophthalmic Drugs)   Current Outpatient Medications (Other)  Medication Sig   ACCU-CHEK AVIVA PLUS test strip TEST 3 TIMES DAILY   Accu-Chek Softclix Lancets lancets CHECK BLOOD SUGAR ONCE DAILY   clobetasol cream (TEMOVATE) 0.05 % Apply 1 application topically 2 (two) times daily.   clopidogrel (PLAVIX) 75 MG tablet TAKE 1 TABLET BY MOUTH EVERY DAY   empagliflozin (JARDIANCE) 25 MG TABS tablet TAKE 1 TABLET(25 MG) BY MOUTH AT BEDTIME   gabapentin (NEURONTIN) 300 MG capsule TAKE 1 CAPSULE(300 MG) BY MOUTH AT BEDTIME   glipiZIDE (GLUCOTROL) 10 MG tablet TAKE 2 TABLETS(20 MG) BY MOUTH TWICE DAILY   JANUMET XR [phone] MG TB24 Take 1 tablet by mouth daily.   metoprolol succinate (TOPROL-XL) 100 MG 24 hr tablet Take 1 tablet (100 mg total)  by mouth daily.   Misc. Devices MISC Cpap supplies-tubing mask Dx G47.33 and Z99.89   nitroGLYCERIN (NITROSTAT) 0.4 MG SL tablet PLACE 1 TABLET UNDER THE TONGUE EVERY 5 MINS AS NEEDED FOR CHEST PAIN. MAXIMUM OF 3 DOSES   nystatin cream (MYCOSTATIN) SMARTSIG:Sparingly Topical 2-3 Times Daily PRN   OZEMPIC, 1 MG/DOSE, 4 MG/3ML SOPN Inject 1 mg into the skin once a week.   sacubitril-valsartan (ENTRESTO) 24-26 MG Take 1 tablet by mouth 2 (two) times daily.   Semaglutide,0.25 or 0.5MG /DOS, (OZEMPIC, 0.25 OR 0.5 MG/DOSE,) 2 MG/1.5ML SOPN Inject 0.25 mg into the skin once a week.   spironolactone (ALDACTONE) 25 MG tablet Take 0.5 tablets (12.5 mg total) by mouth every other day.   triamcinolone ointment (KENALOG) 0.1 % SMARTSIG:sparingly Topical Twice Daily   VENTOLIN HFA 108 (90 Base) MCG/ACT inhaler INHALE 2 PUFFS BY MOUTH EVERY 6 HOURS AS NEEDED FOR WHEEZING OR SHORTNESS OF BREATH   acetaminophen (TYLENOL) 325 MG tablet Take 2 tablets (650 mg total) by mouth every 6 (six) hours as needed for mild pain (or Fever >/= 101). (Patient not taking: Reported on 07/25/2021)   aspirin 81 MG EC tablet Take 81 mg by mouth daily. (Patient not taking: Reported on 10/04/2021)   atorvastatin (LIPITOR) 80 MG tablet Take 1 tablet (80 mg total) by mouth every evening. (Patient not  taking: Reported on 07/25/2021)   ciclopirox (PENLAC) 8 % solution Apply topically at bedtime. Apply over nail and surrounding skin. Apply daily over previous coat. After seven (7) days, may remove with alcohol and continue cycle. (Patient not taking: Reported on 07/25/2021)   sildenafil (VIAGRA) 100 MG tablet Take 100 mg by mouth daily as needed. (Patient not taking: Reported on 07/25/2021)   No current facility-administered medications for this visit. (Other)   REVIEW OF SYSTEMS: ROS   Positive for: Endocrine, Cardiovascular, Eyes, Respiratory Negative for: Constitutional, Gastrointestinal, Neurological, Skin, Genitourinary, Musculoskeletal,  HENT, Psychiatric, Allergic/Imm, Heme/Lymph Last edited by Doreene Nest, COT on 10/04/2021  9:29 AM.      ALLERGIES No Known Allergies  PAST MEDICAL HISTORY Past Medical History:  Diagnosis Date   5 years ago    AICD (automatic cardioverter/defibrillator) present    Anxiety    Arthritis    Cataract    CHF (congestive heart failure) ([name])    Coronary artery disease    Diabetes mellitus without complication ([name])    Diabetic retinopathy ([name])    Dysrhythmia    GERD (gastroesophageal reflux disease)    PMH   Hypertension    Hypertensive retinopathy    Peripheral vascular disease ([name])    Peripheral vascular disease ([name])    Pneumonia    Shoulder impingement, right    Sleep apnea    wears cpap   Stomach ulcer    Wears glasses    Past Surgical History:  Procedure Laterality Date   AMPUTATION Left 03/12/2019   Procedure: 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;  Surgeon: [name], MD;  Location: [facility] OR;  Service: Orthopedics;  Laterality: Left;   AMPUTATION Right 10/01/2020   Procedure: RIGHT 2nd TOE AMPUTATION;  Surgeon: [name], DPM;  Location: [facility] OR;  Service: Podiatry;  Laterality: Right;   CARDIAC CATHETERIZATION     CARDIAC DEFIBRILLATOR PLACEMENT     CIRCUMCISION N/A 11/05/2020   Procedure: CIRCUMCISION ADULT;  Surgeon: [name], MD;  Location: [facility] ORS;  Service: Urology;  Laterality: N/A;   I & D EXTREMITY Left 07/19/2016   Procedure: IRRIGATION AND DEBRIDEMENT EXTREMITY/OLECRANON(WASHOUT);  Surgeon: Tarry Kos, MD;  Location: [facility] OR;  Service: Orthopedics;  Laterality: Left;   OLECRANON BURSECTOMY Left 07/19/2016   Procedure: LEFT ELBOW OLECRANON BURSECTOMY;  Surgeon: Tarry Kos, MD;  Location: [facility];  Service: Orthopedics;  Laterality: Left;   SHOULDER ARTHROSCOPY Right 05/16/2019   Procedure: Right Shoulder Arthroscopy;  Surgeon: [name], MD;  Location: [facility] OR;  Service: Orthopedics;  Laterality: Right;   FAMILY HISTORY Family History   Problem Relation Age of Onset   Heart disease Mother    Diabetes Mother    Peripheral vascular disease Mother    SOCIAL HISTORY Social History   Tobacco Use   Smoking status: Former    Packs/day: 1.00    Years: 40.00    Total pack years: 40.00    Types: Cigarettes    Quit date: 03/11/2019    Years since quitting: 2.5   Smokeless tobacco: Never  Vaping Use   Vaping Use: Never used  Substance Use Topics   Alcohol use: Not Currently   Drug use: Not Currently    Types: Oxycodone       OPHTHALMIC EXAM:  Base Eye Exam     Visual Acuity (Snellen - Linear)       Right Left   Dist cc 20/20 -1 20/30   Dist ph  cc  NI    Correction: Glasses         Tonometry (Tonopen, 9:38 AM)       Right Left   Pressure 17 11         Pupils       Dark Light Shape React APD   Right 3 2 Round Brisk None   Left 3 2 Round Brisk None         Visual Fields (Counting fingers)       Left Right    Full Full         Extraocular Movement       Right Left    Full, Ortho Full, Ortho         Neuro/Psych     Oriented x3: Yes   Mood/Affect: Normal         Dilation     Both eyes: 1.0% Mydriacyl, 2.5% Phenylephrine @ 9:38 AM           Slit Lamp and Fundus Exam     Slit Lamp Exam       Right Left   Lids/Lashes Dermatochalasis - upper lid Dermatochalasis - upper lid   Conjunctiva/Sclera nasal pingeucula nasal pingeucula   Cornea arcus, tear film debris, 1+ PEE arcus, tear film debris, 1+PEE, old subepithelial scars inferior paracentral with haze   Anterior Chamber Deep and quiet Deep and quiet   Iris Round and dilated, No NVI Round and dilated, No NVI   Lens 2-3+ Nuclear sclerosis, 2-3+ Cortical cataract 2-3+ Nuclear sclerosis, 2-3+ Cortical cataract   Anterior Vitreous Vitreous syneresis Vitreous syneresis         Fundus Exam       Right Left   Disc Pink and Sharp, Compact Pink and Sharp, Compact   C/D Ratio 0.1 0.2   Macula Flat, Blunted foveal  reflex, + fine MA Flat, Blunted foveal reflex, +MA/DBH, trace cystic changes nasal fovea--slightly increased   Vessels attenuated, mild tortuousity, mild AV crossing changes, mild copper wiring attenuated, mild tortuousity, mild copper wiring, mild AV crossing changes   Periphery Attached; rare MA Attached; scattered MA & DBH           Refraction     Wearing Rx       Sphere Cylinder Axis Add   Right +1.25 +1.00 137 +2.50   Left +1.00 +0.50 030 +2.50    Type: PAL           IMAGING AND PROCEDURES  Imaging and Procedures for 10/04/2021          ASSESSMENT/PLAN:    ICD-10-CM   1. Moderate nonproliferative diabetic retinopathy of both eyes with macular edema associated with type 2 diabetes mellitus ([name])  E11.3313 OCT, Retina - OU - Both Eyes    2. Essential hypertension  I10     3. Hypertensive retinopathy of both eyes  H35.033     4. Combined forms of age-related cataract of both eyes  H25.813       1. Moderate nonproliferative diabetic retinopathy OU  OD: no DME  OS: mild cystic changes / IRHM nasal macula  - last A1c in Epic is 8.1 09/2021, stressed to the patient getting the A1C under 7 would help improve the vision. - pt working with Endodrinologist and blood glucose improving  - exam shows scattered MA OU - FA (06.21.22) shows late leaking MA, no NV OU - OCT OD Interval development Focal cystic changes temporal macu; OS: Focal cystic changes / IRHM  nasal fovea--persistent, slightly increased, mild interval increase in non cystic changes - BCVA OD 20/20, OS 20/30 from 20/20  recommend IVA OS #1 today (07.25.23) - f/u in 4 weeks -- DFE/OCT/ IVA OS #2  2,3. Hypertensive retinopathy OU - discussed importance of tight BP control - continue to monitor  4. Mixed Cataract OU - The symptoms of cataract, surgical options, and treatments and risks were discussed with patient. - discussed diagnosis and progression - continue to monitor  Ophthalmic Meds Ordered  this visit:  No orders of the defined types were placed in this encounter.     No follow-ups on file.  There are no Patient Instructions on file for this visit.  Explained the diagnoses, plan, and follow up with the patient and they expressed understanding.  Patient expressed understanding of the importance of proper follow up care.   This document serves as a record of services personally performed by Karie Chimera, MD, PhD. It was created on their behalf by Gerilyn Nestle, COT an ophthalmic technician. The creation of this record is the provider's dictation and/or activities during the visit.    Electronically signed by:  Gerilyn Nestle, COT  09/22/21 10:42 AM   Karie Chimera, M.D., Ph.D. Diseases & Surgery of the Retina and Vitreous [facility]    Abbreviations: M myopia (nearsighted); A astigmatism; H hyperopia (farsighted); P presbyopia; Mrx spectacle prescription;  CTL contact lenses; OD right eye; OS left eye; OU both eyes  XT exotropia; ET esotropia; PEK punctate epithelial keratitis; PEE punctate epithelial erosions; DES dry eye syndrome; MGD meibomian gland dysfunction; ATs artificial tears; PFAT's preservative free artificial tears; NSC nuclear sclerotic cataract; PSC posterior subcapsular cataract; ERM epi-retinal membrane; PVD posterior vitreous detachment; RD retinal detachment; DM diabetes mellitus; DR diabetic retinopathy; NPDR non-proliferative diabetic retinopathy; PDR proliferative diabetic retinopathy; CSME clinically significant macular edema; DME diabetic macular edema; dbh dot blot hemorrhages; CWS cotton wool spot; POAG primary open angle glaucoma; C/D cup-to-disc ratio; HVF humphrey visual field; GVF goldmann visual field; OCT optical coherence tomography; IOP intraocular pressure; BRVO Branch retinal vein occlusion; CRVO central retinal vein occlusion; CRAO central retinal artery occlusion; BRAO branch retinal artery occlusion; RT  retinal tear; SB scleral buckle; PPV pars plana vitrectomy; VH Vitreous hemorrhage; PRP panretinal laser photocoagulation; IVK intravitreal kenalog; VMT vitreomacular traction; MH Macular hole;  NVD neovascularization of the disc; NVE neovascularization elsewhere; AREDS age related eye disease study; ARMD age related macular degeneration; POAG primary open angle glaucoma; EBMD epithelial/anterior basement membrane dystrophy; ACIOL anterior chamber intraocular lens; IOL intraocular lens; PCIOL posterior chamber intraocular lens; Phaco/IOL phacoemulsification with intraocular lens placement; PRK photorefractive keratectomy; LASIK laser assisted in situ keratomileusis; HTN hypertension; DM diabetes mellitus; COPD chronic obstructive pulmonary disease

## 2021-09-28 NOTE — Progress Notes (Signed)
Remote ICD transmission.   

## 2021-10-04 ENCOUNTER — Encounter (INDEPENDENT_AMBULATORY_CARE_PROVIDER_SITE_OTHER): Payer: Self-pay | Admitting: Ophthalmology

## 2021-10-04 ENCOUNTER — Ambulatory Visit (INDEPENDENT_AMBULATORY_CARE_PROVIDER_SITE_OTHER): Payer: Medicare Other | Admitting: Ophthalmology

## 2021-10-04 DIAGNOSIS — E113313 Type 2 diabetes mellitus with moderate nonproliferative diabetic retinopathy with macular edema, bilateral: Secondary | ICD-10-CM | POA: Diagnosis not present

## 2021-10-04 DIAGNOSIS — H25813 Combined forms of age-related cataract, bilateral: Secondary | ICD-10-CM

## 2021-10-04 DIAGNOSIS — H35033 Hypertensive retinopathy, bilateral: Secondary | ICD-10-CM | POA: Diagnosis not present

## 2021-10-04 DIAGNOSIS — I10 Essential (primary) hypertension: Secondary | ICD-10-CM | POA: Diagnosis not present

## 2021-10-04 NOTE — Progress Notes (Signed)
[facility] - Clinic Note  10/04/2021     CHIEF COMPLAINT Patient presents for Retina Follow Up  HISTORY OF PRESENT ILLNESS: Jon Jones is a 66 y.o. male who presents to the clinic today for:   HPI     Retina Follow Up   Patient presents with  Diabetic Retinopathy.  In both eyes.  Severity is moderate.  Duration of 6 months.  Since onset it is stable.  I, the attending physician,  performed the HPI with the patient and updated documentation appropriately.        Comments   Patient states vision worse OU. Last a1c was 8.1, checked about 2 months ago. Using Restasis bid OU.      Last edited by Rennis ChrisZamora, Lizzete Gough, MD on 10/04/2021 11:40 PM.    Patient states that the vision has decreased in the last 2-3 months.  Referring physician: Alma DownsAlbert Lundquist, PA-C [facility] [address] [place] [id number]  HISTORICAL INFORMATION:   Selected notes from the MEDICAL RECORD NUMBER Referred by Alma DownsAlbert Lundquist, PA for eval of diabetic retinopathy OU; DME OS   CURRENT MEDICATIONS: Current Outpatient Medications (Ophthalmic Drugs)  Medication Sig   RESTASIS 0.05 % ophthalmic emulsion Place 1 drop into both eyes in the morning and at bedtime.   No current facility-administered medications for this visit. (Ophthalmic Drugs)   Current Outpatient Medications (Other)  Medication Sig   ACCU-CHEK AVIVA PLUS test strip TEST 3 TIMES DAILY   Accu-Chek Softclix Lancets lancets CHECK BLOOD SUGAR ONCE DAILY   clobetasol cream (TEMOVATE) 0.05 % Apply 1 application topically 2 (two) times daily.   clopidogrel (PLAVIX) 75 MG tablet TAKE 1 TABLET BY MOUTH EVERY DAY   empagliflozin (JARDIANCE) 25 MG TABS tablet TAKE 1 TABLET(25 MG) BY MOUTH AT BEDTIME   gabapentin (NEURONTIN) 300 MG capsule TAKE 1 CAPSULE(300 MG) BY MOUTH AT BEDTIME   glipiZIDE (GLUCOTROL) 10 MG tablet TAKE 2 TABLETS(20 MG) BY MOUTH TWICE DAILY   JANUMET XR [phone] MG TB24 Take 1  tablet by mouth daily.   metoprolol succinate (TOPROL-XL) 100 MG 24 hr tablet Take 1 tablet (100 mg total) by mouth daily.   Misc. Devices MISC Cpap supplies-tubing mask Dx G47.33 and Z99.89   nitroGLYCERIN (NITROSTAT) 0.4 MG SL tablet PLACE 1 TABLET UNDER THE TONGUE EVERY 5 MINS AS NEEDED FOR CHEST PAIN. MAXIMUM OF 3 DOSES   nystatin cream (MYCOSTATIN) SMARTSIG:Sparingly Topical 2-3 Times Daily PRN   OZEMPIC, 1 MG/DOSE, 4 MG/3ML SOPN Inject 1 mg into the skin once a week.   sacubitril-valsartan (ENTRESTO) 24-26 MG Take 1 tablet by mouth 2 (two) times daily.   Semaglutide,0.25 or 0.5MG /DOS, (OZEMPIC, 0.25 OR 0.5 MG/DOSE,) 2 MG/1.5ML SOPN Inject 0.25 mg into the skin once a week.   spironolactone (ALDACTONE) 25 MG tablet Take 0.5 tablets (12.5 mg total) by mouth every other day.   triamcinolone ointment (KENALOG) 0.1 % SMARTSIG:sparingly Topical Twice Daily   VENTOLIN HFA 108 (90 Base) MCG/ACT inhaler INHALE 2 PUFFS BY MOUTH EVERY 6 HOURS AS NEEDED FOR WHEEZING OR SHORTNESS OF BREATH   acetaminophen (TYLENOL) 325 MG tablet Take 2 tablets (650 mg total) by mouth every 6 (six) hours as needed for mild pain (or Fever >/= 101). (Patient not taking: Reported on 07/25/2021)   aspirin 81 MG EC tablet Take 81 mg by mouth daily. (Patient not taking: Reported on 10/04/2021)   atorvastatin (LIPITOR) 80 MG tablet Take 1 tablet (80  mg total) by mouth every evening. (Patient not taking: Reported on 07/25/2021)   ciclopirox (PENLAC) 8 % solution Apply topically at bedtime. Apply over nail and surrounding skin. Apply daily over previous coat. After seven (7) days, may remove with alcohol and continue cycle. (Patient not taking: Reported on 07/25/2021)   sildenafil (VIAGRA) 100 MG tablet Take 100 mg by mouth daily as needed. (Patient not taking: Reported on 07/25/2021)   No current facility-administered medications for this visit. (Other)   REVIEW OF SYSTEMS: ROS   Positive for: Endocrine, Cardiovascular, Eyes,  Respiratory Negative for: Constitutional, Gastrointestinal, Neurological, Skin, Genitourinary, Musculoskeletal, HENT, Psychiatric, Allergic/Imm, Heme/Lymph Last edited by Doreene Nest, COT on 10/04/2021  9:29 AM.     ALLERGIES No Known Allergies  PAST MEDICAL HISTORY Past Medical History:  Diagnosis Date   5 years ago    AICD (automatic cardioverter/defibrillator) present    Anxiety    Arthritis    Cataract    CHF (congestive heart failure) ([name])    Coronary artery disease    Diabetes mellitus without complication ([name])    Diabetic retinopathy ([name])    Dysrhythmia    GERD (gastroesophageal reflux disease)    PMH   Hypertension    Hypertensive retinopathy    Peripheral vascular disease ([name])    Peripheral vascular disease ([name])    Pneumonia    Shoulder impingement, right    Sleep apnea    wears cpap   Stomach ulcer    Wears glasses    Past Surgical History:  Procedure Laterality Date   AMPUTATION Left 03/12/2019   Procedure: 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;  Surgeon: [name], MD;  Location: [facility] OR;  Service: Orthopedics;  Laterality: Left;   AMPUTATION Right 10/01/2020   Procedure: RIGHT 2nd TOE AMPUTATION;  Surgeon: [name], DPM;  Location: [facility] OR;  Service: Podiatry;  Laterality: Right;   CARDIAC CATHETERIZATION     CARDIAC DEFIBRILLATOR PLACEMENT     CIRCUMCISION N/A 11/05/2020   Procedure: CIRCUMCISION ADULT;  Surgeon: [name], MD;  Location: [facility] ORS;  Service: Urology;  Laterality: N/A;   I & D EXTREMITY Left 07/19/2016   Procedure: IRRIGATION AND DEBRIDEMENT EXTREMITY/OLECRANON(WASHOUT);  Surgeon: Tarry Kos, MD;  Location[facility] OR;  Service: Orthopedics;  Laterality: Left;   OLECRANON BURSECTOMY Left 07/19/2016   Procedure: LEFT ELBOW OLECRANON BURSECTOMY;  Surgeon: Tarry Kos, MD;  Location: [facility];  Service: Orthopedics;  Laterality: Left;   SHOULDER ARTHROSCOPY Right 05/16/2019   Procedure: Right Shoulder Arthroscopy;  Surgeon: [name], MD;  Location: [facility] OR;  Service: Orthopedics;  Laterality: Right;   FAMILY HISTORY Family History  Problem Relation Age of Onset   Heart disease Mother    Diabetes Mother    Peripheral vascular disease Mother    SOCIAL HISTORY Social History   Tobacco Use   Smoking status: Former    Packs/day: 1.00    Years: 40.00    Total pack years: 40.00    Types: Cigarettes    Quit date: 03/11/2019    Years since quitting: 2.5   Smokeless tobacco: Never  Vaping Use   Vaping Use: Never used  Substance Use Topics   Alcohol use: Not Currently   Drug use: Not Currently    Types: Oxycodone       OPHTHALMIC EXAM:  Base Eye Exam     Visual Acuity (Snellen - Linear)       Right Left   Dist cc  20/20 -1 20/30   Dist ph cc  NI    Correction: Glasses         Tonometry (Tonopen, 9:38 AM)       Right Left   Pressure 17 11         Pupils       Dark Light Shape React APD   Right 3 2 Round Brisk None   Left 3 2 Round Brisk None         Visual Fields (Counting fingers)       Left Right    Full Full         Extraocular Movement       Right Left    Full, Ortho Full, Ortho         Neuro/Psych     Oriented x3: Yes   Mood/Affect: Normal         Dilation     Both eyes: 1.0% Mydriacyl, 2.5% Phenylephrine @ 9:38 AM           Slit Lamp and Fundus Exam     Slit Lamp Exam       Right Left   Lids/Lashes Dermatochalasis - upper lid Dermatochalasis - upper lid   Conjunctiva/Sclera nasal pingeucula nasal pingeucula   Cornea arcus, tear film debris, 1+ PEE arcus, tear film debris, 1+PEE, old subepithelial scars inferior paracentral with haze   Anterior Chamber Deep and quiet Deep and quiet   Iris Round and dilated, No NVI Round and dilated, No NVI   Lens 2-3+ Nuclear sclerosis, 2-3+ Cortical cataract 2-3+ Nuclear sclerosis, 2-3+ Cortical cataract   Anterior Vitreous Vitreous syneresis Vitreous syneresis         Fundus Exam       Right Left    Disc Pink and Sharp, Compact Pink and Sharp, Compact   C/D Ratio 0.1 0.2   Macula Flat, Blunted foveal reflex, + MA, / DBH greatest temporal macula  trace cystic changes Flat, Blunted foveal reflex, +MA/DBH, trace cystic changes nasal fovea--slightly increased, scattered cystic changes, prominent blot hem inferior to fovea   Vessels attenuated, mild tortuousity, mild AV crossing changes, mild copper wiring attenuated, mild tortuousity, mild copper wiring, mild AV crossing changes   Periphery Attached; scattered DBH Attached; scattered DBH           Refraction     Wearing Rx       Sphere Cylinder Axis Add   Right +1.25 +1.00 137 +2.50   Left +1.00 +0.50 030 +2.50    Type: PAL           IMAGING AND PROCEDURES  Imaging and Procedures for 10/04/2021  OCT, Retina - OU - Both Eyes       Right Eye Quality was good. Central Foveal Thickness: 247. Progression has worsened. Findings include normal foveal contour, no SRF, intraretinal fluid (Interval development Focal cystic changes temporal macula).   Left Eye Quality was good. Central Foveal Thickness: 252. Progression has worsened. Findings include normal foveal contour, no SRF, intraretinal hyper-reflective material, intraretinal fluid (Focal cystic changes / IRHM nasal fovea--persistent, slightly increased, mild interval increase in non cystic changes).   Notes *Images captured and stored on drive  Diagnosis / Impression:  OD: Interval development of focal cystic changes temporal macula OS: Focal cystic changes / IRHM nasal fovea -- slightly increased, mild interval increase in non cystic changes  Clinical management:  See below  Abbreviations: NFP - Normal foveal profile. CME - cystoid macular edema. PED - pigment  epithelial detachment. IRF - intraretinal fluid. SRF - subretinal fluid. EZ - ellipsoid zone. ERM - epiretinal membrane. ORA - outer retinal atrophy. ORT - outer retinal tubulation. SRHM - subretinal  hyper-reflective material. IRHM - intraretinal hyper-reflective material      Intravitreal Injection, Pharmacologic Agent - OS - Left Eye       Time Out 10/04/2021. 11:11 AM. Confirmed correct patient, procedure, site, and patient consented.   Anesthesia Anesthetic medications included Lidocaine 2%, Proparacaine 0.5%.   Procedure Preparation included 5% betadine to ocular surface, eyelid speculum. A supplied needle was used.   Injection: 1.25 mg Bevacizumab 1.25mg /0.6ml   Route: Intravitreal, Site: Left Eye   NDC: [id number], Lot: [id number], Expiration date: 11/14/2021   Post-op Post injection exam found visual acuity of at least counting fingers. The patient tolerated the procedure well. There were no complications. The patient received written and verbal post procedure care education.            ASSESSMENT/PLAN:    ICD-10-CM   1. Moderate nonproliferative diabetic retinopathy of both eyes with macular edema associated with type 2 diabetes mellitus ([name])  E11.3313 OCT, Retina - OU - Both Eyes    Intravitreal Injection, Pharmacologic Agent - OS - Left Eye    Bevacizumab (AVASTIN) SOLN 1.25 mg    2. Essential hypertension  I10     3. Hypertensive retinopathy of both eyes  H35.033     4. Combined forms of age-related cataract of both eyes  H25.813      1. Moderate nonproliferative diabetic retinopathy OU  OD: interval development of focal cystic changes / mild DME  OS: mild cystic changes / IRHM nasal macula -- slightly worse  - last A1c 8.1 09/2021 per pt report, stressed to the patient getting the A1C under 7 would help improve the vision. - pt working with Endodrinologist and blood glucose improving  - exam shows scattered MA OU - FA (06.21.22) shows late leaking MA, no NV OU - OCT OD Interval development of focal cystic changes temporal macula; OS: Focal cystic changes / IRHM nasal fovea-- slightly increased, mild interval increase in non cystic changes - BCVA OD  20/20 (stable), OS 20/30 from 20/20 - recommend IVA OS #1 today (07.25.23) - RBA of procedure discussed, questions answered - Avastin informed consent obtained and signed 07.25.23 - see procedure note  - f/u in 4 weeks -- DFE/OCT, possible injection  2,3. Hypertensive retinopathy OU - discussed importance of tight BP control - continue to monitor  4. Mixed Cataract OU - The symptoms of cataract, surgical options, and treatments and risks were discussed with patient. - discussed diagnosis and progression - continue to monitor  Ophthalmic Meds Ordered this visit:  Meds ordered this encounter  Medications   Bevacizumab (AVASTIN) SOLN 1.25 mg      Return in about 4 weeks (around 11/01/2021) for MOD NPDR , DFE, OCT, IVA, OS.  There are no Patient Instructions on file for this visit.  Explained the diagnoses, plan, and follow up with the patient and they expressed understanding.  Patient expressed understanding of the importance of proper follow up care.   This document serves as a record of services personally performed by Karie Chimera, MD, PhD. It was created on their behalf by Gerilyn Nestle, COT an ophthalmic technician. The creation of this record is the provider's dictation and/or activities during the visit.    Electronically signed by:  Gerilyn Nestle, COT  09/22/21 9:37 AM  Karie Chimera, M.D.,  Ph.D. Diseases & Surgery of the Retina and Vitreous [facility]  I have reviewed the above documentation for accuracy and completeness, and I agree with the above. Karie Chimera, M.D., Ph.D. 10/05/21 9:37 AM   Abbreviations: M myopia (nearsighted); A astigmatism; H hyperopia (farsighted); P presbyopia; Mrx spectacle prescription;  CTL contact lenses; OD right eye; OS left eye; OU both eyes  XT exotropia; ET esotropia; PEK punctate epithelial keratitis; PEE punctate epithelial erosions; DES dry eye syndrome; MGD meibomian gland dysfunction; ATs  artificial tears; PFAT's preservative free artificial tears; NSC nuclear sclerotic cataract; PSC posterior subcapsular cataract; ERM epi-retinal membrane; PVD posterior vitreous detachment; RD retinal detachment; DM diabetes mellitus; DR diabetic retinopathy; NPDR non-proliferative diabetic retinopathy; PDR proliferative diabetic retinopathy; CSME clinically significant macular edema; DME diabetic macular edema; dbh dot blot hemorrhages; CWS cotton wool spot; POAG primary open angle glaucoma; C/D cup-to-disc ratio; HVF humphrey visual field; GVF goldmann visual field; OCT optical coherence tomography; IOP intraocular pressure; BRVO Branch retinal vein occlusion; CRVO central retinal vein occlusion; CRAO central retinal artery occlusion; BRAO branch retinal artery occlusion; RT retinal tear; SB scleral buckle; PPV pars plana vitrectomy; VH Vitreous hemorrhage; PRP panretinal laser photocoagulation; IVK intravitreal kenalog; VMT vitreomacular traction; MH Macular hole;  NVD neovascularization of the disc; NVE neovascularization elsewhere; AREDS age related eye disease study; ARMD age related macular degeneration; POAG primary open angle glaucoma; EBMD epithelial/anterior basement membrane dystrophy; ACIOL anterior chamber intraocular lens; IOL intraocular lens; PCIOL posterior chamber intraocular lens; Phaco/IOL phacoemulsification with intraocular lens placement; PRK photorefractive keratectomy; LASIK laser assisted in situ keratomileusis; HTN hypertension; DM diabetes mellitus; COPD chronic obstructive pulmonary disease

## 2021-10-05 MED ORDER — BEVACIZUMAB CHEMO INJECTION 1.25MG/0.05ML SYRINGE FOR KALEIDOSCOPE
1.2500 mg | INTRAVITREAL | Status: AC | PRN
  Administered 2021-10-05: 1.25 mg via INTRAVITREAL

## 2021-10-16 IMAGING — DX DG CHEST 1V PORT
1 series · 1 of 1 positions shown · non-contrast
Comparison: None.

CLINICAL DATA: He pre MRI.  Pacer

EXAM:
PORTABLE CHEST 1 VIEW

[chest]
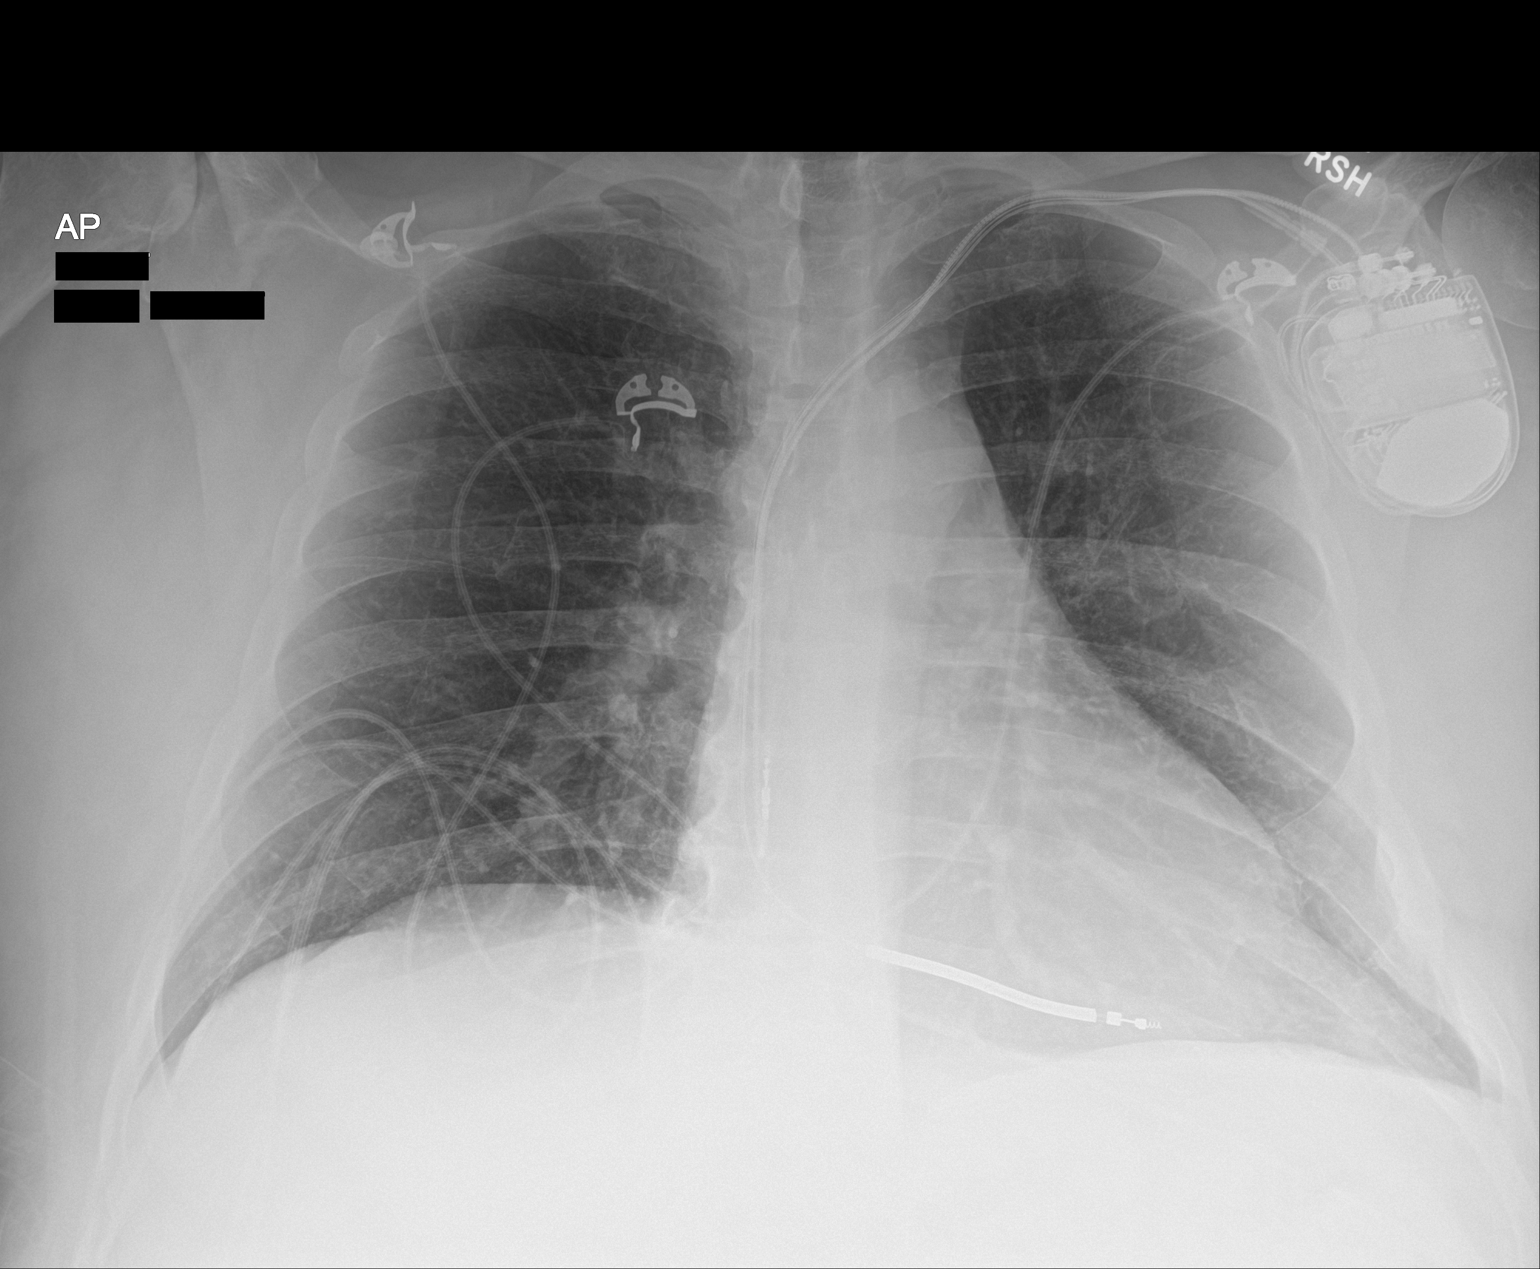

[1 of 1 positions shown; findings below may reference images not displayed]

FINDINGS: Dual-chamber pacer/ICD from the left. No visible fractured or
abandoned lead.

There is no edema, consolidation, effusion, or pneumothorax.

Normal heart size and mediastinal contours.

Artifact from EKG leads
IMPRESSION: 1. Dual-chamber AICD. No visible fractured or abandoned lead.
2. No evidence of active disease.

## 2021-10-19 NOTE — Progress Notes (Signed)
[facility] - Clinic Note  11/01/2021     CHIEF COMPLAINT Patient presents for Retina Follow Up  HISTORY OF PRESENT ILLNESS: Jon Jones is a 66 y.o. male who presents to the clinic today for:   HPI     Retina Follow Up   Patient presents with  Diabetic Retinopathy.  In left eye.  Severity is mild.  Duration of 4 weeks.  Since onset it is gradually improving.  I, the attending physician,  performed the HPI with the patient and updated documentation appropriately.        Comments   4 week Retina eval for NPDR ou. Patient states vision has improved.  Blood sugar 156      Last edited by Rennis Chris, MD on 11/01/2021 11:47 AM.    Pt states no problems after first injection at last visit, he states vision has improved  Referring physician: Alma Downs, PA-C No referring provider defined for this encounter.  HISTORICAL INFORMATION:   Selected notes from the MEDICAL RECORD NUMBER Referred by Alma Downs, PA for eval of diabetic retinopathy OU; DME OS   CURRENT MEDICATIONS: Current Outpatient Medications (Ophthalmic Drugs)  Medication Sig   RESTASIS 0.05 % ophthalmic emulsion Place 1 drop into both eyes in the morning and at bedtime.   No current facility-administered medications for this visit. (Ophthalmic Drugs)   Current Outpatient Medications (Other)  Medication Sig   ACCU-CHEK AVIVA PLUS test strip TEST 3 TIMES DAILY   Accu-Chek Softclix Lancets lancets CHECK BLOOD SUGAR ONCE DAILY   acetaminophen (TYLENOL) 325 MG tablet Take 2 tablets (650 mg total) by mouth every 6 (six) hours as needed for mild pain (or Fever >/= 101).   aspirin 81 MG EC tablet Take 81 mg by mouth daily.   atorvastatin (LIPITOR) 80 MG tablet Take 1 tablet (80 mg total) by mouth every evening.   ciclopirox (PENLAC) 8 % solution Apply topically at bedtime. Apply over nail and surrounding skin. Apply daily over previous coat. After seven (7) days, may remove with alcohol  and continue cycle.   clobetasol cream (TEMOVATE) 0.05 % Apply 1 application topically 2 (two) times daily.   clopidogrel (PLAVIX) 75 MG tablet TAKE 1 TABLET BY MOUTH EVERY DAY   empagliflozin (JARDIANCE) 25 MG TABS tablet TAKE 1 TABLET(25 MG) BY MOUTH AT BEDTIME   gabapentin (NEURONTIN) 300 MG capsule TAKE 1 CAPSULE(300 MG) BY MOUTH AT BEDTIME   glipiZIDE (GLUCOTROL) 10 MG tablet TAKE 2 TABLETS(20 MG) BY MOUTH TWICE DAILY   JANUMET XR [phone] MG TB24 Take 1 tablet by mouth daily.   metoprolol succinate (TOPROL-XL) 100 MG 24 hr tablet Take 1 tablet (100 mg total) by mouth daily.   Misc. Devices MISC Cpap supplies-tubing mask Dx G47.33 and Z99.89   nitroGLYCERIN (NITROSTAT) 0.4 MG SL tablet PLACE 1 TABLET UNDER THE TONGUE EVERY 5 MINS AS NEEDED FOR CHEST PAIN. MAXIMUM OF 3 DOSES   nystatin cream (MYCOSTATIN) SMARTSIG:Sparingly Topical 2-3 Times Daily PRN   OZEMPIC, 1 MG/DOSE, 4 MG/3ML SOPN Inject 1 mg into the skin once a week.   sacubitril-valsartan (ENTRESTO) 24-26 MG Take 1 tablet by mouth 2 (two) times daily.   Semaglutide,0.25 or 0.5MG /DOS, (OZEMPIC, 0.25 OR 0.5 MG/DOSE,) 2 MG/1.5ML SOPN Inject 0.25 mg into the skin once a week.   sildenafil (VIAGRA) 100 MG tablet Take 100 mg by mouth daily as needed.   spironolactone (ALDACTONE) 25 MG tablet Take 0.5 tablets (12.5 mg total) by mouth every other  day.   triamcinolone ointment (KENALOG) 0.1 % SMARTSIG:sparingly Topical Twice Daily   VENTOLIN HFA 108 (90 Base) MCG/ACT inhaler INHALE 2 PUFFS BY MOUTH EVERY 6 HOURS AS NEEDED FOR WHEEZING OR SHORTNESS OF BREATH   No current facility-administered medications for this visit. (Other)   REVIEW OF SYSTEMS: ROS   Positive for: Endocrine, Cardiovascular, Eyes, Respiratory Negative for: Constitutional, Gastrointestinal, Neurological, Skin, Genitourinary, Musculoskeletal, HENT, Psychiatric, Allergic/Imm, Heme/Lymph Last edited by Lana Fish, COT on 11/01/2021  9:21 AM.     ALLERGIES No Known  Allergies  PAST MEDICAL HISTORY Past Medical History:  Diagnosis Date   5 years ago    AICD (automatic cardioverter/defibrillator) present    Anxiety    Arthritis    Cataract    CHF (congestive heart failure) ([name])    Coronary artery disease    Diabetes mellitus without complication ([name])    Diabetic retinopathy ([name])    Dysrhythmia    GERD (gastroesophageal reflux disease)    PMH   Hypertension    Hypertensive retinopathy    Peripheral vascular disease ([name])    Peripheral vascular disease ([name])    Pneumonia    Shoulder impingement, right    Sleep apnea    wears cpap   Stomach ulcer    Wears glasses    Past Surgical History:  Procedure Laterality Date   AMPUTATION Left 03/12/2019   Procedure: 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;  Surgeon: [name], MD;  Location: [facility] OR;  Service: Orthopedics;  Laterality: Left;   AMPUTATION Right 10/01/2020   Procedure: RIGHT 2nd TOE AMPUTATION;  Surgeon: [name], DPM;  Location: [facility] OR;  Service: Podiatry;  Laterality: Right;   CARDIAC CATHETERIZATION     CARDIAC DEFIBRILLATOR PLACEMENT     CIRCUMCISION N/A 11/05/2020   Procedure: CIRCUMCISION ADULT;  Surgeon: [name], MD;  Location: [facility] ORS;  Service: Urology;  Laterality: N/A;   I & D EXTREMITY Left 07/19/2016   Procedure: IRRIGATION AND DEBRIDEMENT EXTREMITY/OLECRANON(WASHOUT);  Surgeon: Tarry Kos, MD;  Location: [facility] OR;  Service: Orthopedics;  Laterality: Left;   OLECRANON BURSECTOMY Left 07/19/2016   Procedure: LEFT ELBOW OLECRANON BURSECTOMY;  Surgeon: Tarry Kos, MD;  Location: [facility];  Service: Orthopedics;  Laterality: Left;   SHOULDER ARTHROSCOPY Right 05/16/2019   Procedure: Right Shoulder Arthroscopy;  Surgeon: [name], MD;  Location: [facility] OR;  Service: Orthopedics;  Laterality: Right;   FAMILY HISTORY Family History  Problem Relation Age of Onset   Heart disease Mother    Diabetes Mother    Peripheral vascular disease Mother    SOCIAL  HISTORY Social History   Tobacco Use   Smoking status: Former    Packs/day: 1.00    Years: 40.00    Total pack years: 40.00    Types: Cigarettes    Quit date: 03/11/2019    Years since quitting: 2.6   Smokeless tobacco: Never  Vaping Use   Vaping Use: Never used  Substance Use Topics   Alcohol use: Not Currently   Drug use: Not Currently    Types: Oxycodone       OPHTHALMIC EXAM:  Base Eye Exam     Visual Acuity (Snellen - Linear)       Right Left   Dist cc 20/20-1 20/20-2    Correction: Glasses         Tonometry (Tonopen, 9:23 AM)       Right Left   Pressure 11 11  Pupils       Dark Light Shape React APD   Right 3 2 Round Brisk None   Left 3 2 Round Brisk None         Visual Fields (Counting fingers)       Left Right    Full Full         Extraocular Movement       Right Left    Full, Ortho Full, Ortho         Neuro/Psych     Oriented x3: Yes   Mood/Affect: Normal         Dilation     Both eyes: 1.0% Mydriacyl, 2.5% Phenylephrine @ 9:23 AM           Slit Lamp and Fundus Exam     Slit Lamp Exam       Right Left   Lids/Lashes Dermatochalasis - upper lid Dermatochalasis - upper lid   Conjunctiva/Sclera nasal pingeucula nasal pingeucula   Cornea arcus, tear film debris, 1+ PEE arcus, tear film debris, 1+PEE, old subepithelial scars inferior paracentral with haze   Anterior Chamber Deep and quiet Deep and quiet   Iris Round and dilated, No NVI Round and dilated, No NVI   Lens 2-3+ Nuclear sclerosis, 2-3+ Cortical cataract 2-3+ Nuclear sclerosis, 2-3+ Cortical cataract   Anterior Vitreous Vitreous syneresis Vitreous syneresis         Fundus Exam       Right Left   Disc Pink and Sharp, Compact Pink and Sharp, Compact   C/D Ratio 0.1 0.2   Macula Flat, Blunted foveal reflex, + MA / DBH greatest temporal macula, trace cystic changes Flat, Blunted foveal reflex, +MA/DBH, trace cystic changes nasal fovea -- slightly  improved, scattered cystic changes, prominent blot hem inferior to fovea   Vessels attenuated, mild tortuousity, mild AV crossing changes, mild copper wiring attenuated, mild tortuousity, mild copper wiring, mild AV crossing changes   Periphery Attached; scattered DBH Attached; scattered DBH           Refraction     Wearing Rx       Sphere Cylinder Axis Add   Right +1.25 +1.00 137 +2.50   Left +1.00 +0.50 030 +2.50    Type: PAL           IMAGING AND PROCEDURES  Imaging and Procedures for 11/01/2021  OCT, Retina - OU - Both Eyes       Right Eye Quality was good. Central Foveal Thickness: 247. Progression has worsened. Findings include normal foveal contour, no SRF, intraretinal fluid (Persistent IRF / non-central cystic changes -- slightly increased temporal and SN macula).   Left Eye Quality was good. Central Foveal Thickness: 252. Progression has improved. Findings include normal foveal contour, no SRF, intraretinal hyper-reflective material, intraretinal fluid (Focal cystic changes / IRHM nasal fovea--persistent, slightly improved, mild interval improvement in IRF/cystic changes).   Notes *Images captured and stored on drive  Diagnosis / Impression:  OD: Persistent IRF / non-central cystic changes -- slightly increased temporal and SN macula OS: Focal cystic changes / IRHM nasal fovea--persistent, slightly improved; mild interval improvement in IRF/cystic changes  Clinical management:  See below  Abbreviations: NFP - Normal foveal profile. CME - cystoid macular edema. PED - pigment epithelial detachment. IRF - intraretinal fluid. SRF - subretinal fluid. EZ - ellipsoid zone. ERM - epiretinal membrane. ORA - outer retinal atrophy. ORT - outer retinal tubulation. SRHM - subretinal hyper-reflective material. IRHM - intraretinal hyper-reflective material  Intravitreal Injection, Pharmacologic Agent - OS - Left Eye       Time Out 11/01/2021. 10:23 AM. Confirmed  correct patient, procedure, site, and patient consented.   Anesthesia Topical anesthesia was used. Anesthetic medications included Lidocaine 2%, Proparacaine 0.5%.   Procedure Preparation included 5% betadine to ocular surface, eyelid speculum. A supplied needle was used.   Injection: 1.25 mg Bevacizumab 1.25mg /0.6105ml   Route: Intravitreal, Site: Left Eye   NDC: 91478-295-62: 50242-060-01, Lot: [id number] , Expiration date: 12/13/2021   Post-op Post injection exam found visual acuity of at least counting fingers. The patient tolerated the procedure well. There were no complications. The patient received written and verbal post procedure care education. Post injection medications were not given.            ASSESSMENT/PLAN:    ICD-10-CM   1. Moderate nonproliferative diabetic retinopathy of both eyes with macular edema associated with type 2 diabetes mellitus ([name])  E11.3313 OCT, Retina - OU - Both Eyes    Intravitreal Injection, Pharmacologic Agent - OS - Left Eye    Bevacizumab (AVASTIN) SOLN 1.25 mg    2. Essential hypertension  I10     3. Hypertensive retinopathy of both eyes  H35.033     4. Combined forms of age-related cataract of both eyes  H25.813      1. Moderate nonproliferative diabetic retinopathy w/ DME OU - s/p IVA OS #1 (07.25.23)  - last A1c 8.1 09/2021 per pt report, stressed to the patient getting the A1C under 7 would help improve the vision. - pt working with Endodrinologist and blood glucose improving  - exam shows scattered MA OU - FA (06.21.22) shows late leaking MA, no NV OU - OCT OD: Persistent IRF / non-central cystic changes -- slightly increased temporal and SN macula; OS: Focal cystic changes / IRHM nasal fovea--persistent, slightly improved, mild interval improvement in IRF/cystic changes - BCVA OD 20/20 (stable), OS 20/20 from 20/30 - recommend IVA OS #2 today (08.22.23) - RBA of procedure discussed, questions answered - Avastin informed consent obtained and  signed 07.25.23 - see procedure note  - f/u in 4 weeks -- DFE/OCT, possible injection  2,3. Hypertensive retinopathy OU - discussed importance of tight BP control - continue to monitor  4. Mixed Cataract OU - The symptoms of cataract, surgical options, and treatments and risks were discussed with patient. - discussed diagnosis and progression - continue to monitor  Ophthalmic Meds Ordered this visit:  Meds ordered this encounter  Medications   Bevacizumab (AVASTIN) SOLN 1.25 mg      Return in about 4 weeks (around 11/29/2021) for f/u NPDR OU, DFE, OCT.  There are no Patient Instructions on file for this visit.  Explained the diagnoses, plan, and follow up with the patient and they expressed understanding.  Patient expressed understanding of the importance of proper follow up care.   This document serves as a record of services personally performed by Karie ChimeraBrian G. Galina Haddox, MD, PhD. It was created on their behalf by Gerilyn Nestlehristine Shamlian, COT an ophthalmic technician. The creation of this record is the provider's dictation and/or activities during the visit.    Electronically signed by:  Gerilyn Nestlehristine Shamlian, COT  10/19/21 11:49 AM  This document serves as a record of services personally performed by Karie ChimeraBrian G. Dreyson Mishkin, MD, PhD. It was created on their behalf by Glee ArvinAmanda J. Manson PasseyBrown, OA an ophthalmic technician. The creation of this record is the provider's dictation and/or activities during the visit.    Electronically signed by:  Glee Arvin. Manson Passey, [place] 08.22.2023 11:49 AM  Karie Chimera, M.D., Ph.D. Diseases & Surgery of the Retina and Vitreous [facility]  I have reviewed the above documentation for accuracy and completeness, and I agree with the above. Karie Chimera, M.D., Ph.D. 11/01/21 11:51 AM    Abbreviations: M myopia (nearsighted); A astigmatism; H hyperopia (farsighted); P presbyopia; Mrx spectacle prescription;  CTL contact lenses; OD right eye; OS left eye;  OU both eyes  XT exotropia; ET esotropia; PEK punctate epithelial keratitis; PEE punctate epithelial erosions; DES dry eye syndrome; MGD meibomian gland dysfunction; ATs artificial tears; PFAT's preservative free artificial tears; NSC nuclear sclerotic cataract; PSC posterior subcapsular cataract; ERM epi-retinal membrane; PVD posterior vitreous detachment; RD retinal detachment; DM diabetes mellitus; DR diabetic retinopathy; NPDR non-proliferative diabetic retinopathy; PDR proliferative diabetic retinopathy; CSME clinically significant macular edema; DME diabetic macular edema; dbh dot blot hemorrhages; CWS cotton wool spot; POAG primary open angle glaucoma; C/D cup-to-disc ratio; HVF humphrey visual field; GVF goldmann visual field; OCT optical coherence tomography; IOP intraocular pressure; BRVO Branch retinal vein occlusion; CRVO central retinal vein occlusion; CRAO central retinal artery occlusion; BRAO branch retinal artery occlusion; RT retinal tear; SB scleral buckle; PPV pars plana vitrectomy; VH Vitreous hemorrhage; PRP panretinal laser photocoagulation; IVK intravitreal kenalog; VMT vitreomacular traction; MH Macular hole;  NVD neovascularization of the disc; NVE neovascularization elsewhere; AREDS age related eye disease study; ARMD age related macular degeneration; POAG primary open angle glaucoma; EBMD epithelial/anterior basement membrane dystrophy; ACIOL anterior chamber intraocular lens; IOL intraocular lens; PCIOL posterior chamber intraocular lens; Phaco/IOL phacoemulsification with intraocular lens placement; PRK photorefractive keratectomy; LASIK laser assisted in situ keratomileusis; HTN hypertension; DM diabetes mellitus; COPD chronic obstructive pulmonary disease

## 2021-10-25 ENCOUNTER — Other Ambulatory Visit: Payer: Self-pay | Admitting: Cardiology

## 2021-10-31 ENCOUNTER — Ambulatory Visit (INDEPENDENT_AMBULATORY_CARE_PROVIDER_SITE_OTHER): Payer: Medicare Other | Admitting: Podiatry

## 2021-10-31 ENCOUNTER — Encounter: Payer: Self-pay | Admitting: Podiatry

## 2021-10-31 DIAGNOSIS — E1140 Type 2 diabetes mellitus with diabetic neuropathy, unspecified: Secondary | ICD-10-CM | POA: Insufficient documentation

## 2021-10-31 DIAGNOSIS — I739 Peripheral vascular disease, unspecified: Secondary | ICD-10-CM

## 2021-10-31 DIAGNOSIS — M79674 Pain in right toe(s): Secondary | ICD-10-CM | POA: Diagnosis not present

## 2021-10-31 DIAGNOSIS — B351 Tinea unguium: Secondary | ICD-10-CM

## 2021-10-31 DIAGNOSIS — E1149 Type 2 diabetes mellitus with other diabetic neurological complication: Secondary | ICD-10-CM

## 2021-10-31 DIAGNOSIS — M79675 Pain in left toe(s): Secondary | ICD-10-CM

## 2021-10-31 DIAGNOSIS — Z89421 Acquired absence of other right toe(s): Secondary | ICD-10-CM

## 2021-10-31 NOTE — Progress Notes (Signed)
This patient returns to my office for at risk foot care.  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  This patient presents for at risk foot care today.  General Appearance  Alert, conversant and in no acute stress.  Vascular  Dorsalis pedis and posterior tibial  pulses are weakly  palpable  bilaterally.  Capillary return is within normal limits  bilaterally. Temperature is within normal limits  bilaterally.  Neurologic  Senn-Weinstein monofilament wire test diminished  bilaterally. Muscle power within normal limits bilaterally.  Nails Thick disfigured discolored nails with subungual debris  from  1-4  toes left. and 1,3-5 toes right. . No evidence of bacterial infection or drainage bilaterally.  Orthopedic  No limitations of motion  feet .  No crepitus or effusions noted.  No bony pathology or digital deformities noted. Hallux valgus.  Hammer toes  B/L.  Skin  normotropic skin with no porokeratosis noted bilaterally.  No signs of infections or ulcers noted.     Onychomycosis  Pain in right toes  Pain in left toes  Consent was obtained for treatment procedures.   Mechanical debridement of nails 1-4 left and 1,3-5 right performed with a nail nipper.  Filed with dremel without incident. Patient qualifies for diabetic shoes due to DPN and HAV  B/L.   Return office visit   3 months                   Told patient to return for periodic foot care and evaluation due to potential at risk complications.   Wanette Robison DPM   

## 2021-11-01 ENCOUNTER — Encounter (INDEPENDENT_AMBULATORY_CARE_PROVIDER_SITE_OTHER): Payer: Self-pay | Admitting: Ophthalmology

## 2021-11-01 ENCOUNTER — Ambulatory Visit (INDEPENDENT_AMBULATORY_CARE_PROVIDER_SITE_OTHER): Payer: Medicare Other | Admitting: Ophthalmology

## 2021-11-01 DIAGNOSIS — I10 Essential (primary) hypertension: Secondary | ICD-10-CM

## 2021-11-01 DIAGNOSIS — H35033 Hypertensive retinopathy, bilateral: Secondary | ICD-10-CM

## 2021-11-01 DIAGNOSIS — E113313 Type 2 diabetes mellitus with moderate nonproliferative diabetic retinopathy with macular edema, bilateral: Secondary | ICD-10-CM | POA: Diagnosis not present

## 2021-11-01 DIAGNOSIS — H25813 Combined forms of age-related cataract, bilateral: Secondary | ICD-10-CM | POA: Diagnosis not present

## 2021-11-01 MED ORDER — BEVACIZUMAB CHEMO INJECTION 1.25MG/0.05ML SYRINGE FOR KALEIDOSCOPE
1.2500 mg | INTRAVITREAL | Status: AC | PRN
  Administered 2021-11-01: 1.25 mg via INTRAVITREAL

## 2021-11-09 ENCOUNTER — Ambulatory Visit: Payer: Medicare Other | Admitting: Family

## 2021-11-17 NOTE — Progress Notes (Signed)
[facility] Note  11/29/2021     CHIEF COMPLAINT Patient presents for Retina Follow Up  HISTORY OF PRESENT ILLNESS: Jon Jones is a 66 y.o. male who presents to the clinic today for:   HPI     Retina Follow Up   Patient presents with  Diabetic Retinopathy.  In both eyes.  Severity is moderate.  Duration of 4 weeks.  Since onset it is stable.  I, the attending physician,  performed the HPI with the patient and updated documentation appropriately.        Comments   Pt here for 4 wk ret f/u NPDR OU. Pt states VA the same, no changes.       Last edited by Bernarda Caffey, MD on 11/29/2021 12:33 PM.    Pt states he had no problems after the last injection, he feels his eyes are dry   Referring physician: Shirleen Schirmer, PA-C No referring provider defined for this encounter.  HISTORICAL INFORMATION:   Selected notes from the MEDICAL RECORD NUMBER Referred by Shirleen Schirmer, PA for eval of diabetic retinopathy OU; DME OS   CURRENT MEDICATIONS: Current Outpatient Medications (Ophthalmic Drugs)  Medication Sig   RESTASIS 0.05 % ophthalmic emulsion Place 1 drop into both eyes in the morning and at bedtime.   No current facility-administered medications for this visit. (Ophthalmic Drugs)   Current Outpatient Medications (Other)  Medication Sig   ACCU-CHEK AVIVA PLUS test strip TEST 3 TIMES DAILY   Accu-Chek Softclix Lancets lancets CHECK BLOOD SUGAR ONCE DAILY   acetaminophen (TYLENOL) 325 MG tablet Take 2 tablets (650 mg total) by mouth every 6 (six) hours as needed for mild pain (or Fever >/= 101).   aspirin 81 MG EC tablet Take 81 mg by mouth daily.   atorvastatin (LIPITOR) 80 MG tablet Take 1 tablet (80 mg total) by mouth every evening.   ciclopirox (PENLAC) 8 % solution Apply topically at bedtime. Apply over nail and surrounding skin. Apply daily over previous coat. After seven (7) days, may remove with alcohol and continue cycle.    clobetasol cream (TEMOVATE) 4.16 % Apply 1 application topically 2 (two) times daily.   clopidogrel (PLAVIX) 75 MG tablet TAKE 1 TABLET BY MOUTH EVERY DAY   empagliflozin (JARDIANCE) 25 MG TABS tablet TAKE 1 TABLET(25 MG) BY MOUTH AT BEDTIME   gabapentin (NEURONTIN) 300 MG capsule TAKE 1 CAPSULE(300 MG) BY MOUTH AT BEDTIME   glipiZIDE (GLUCOTROL) 10 MG tablet TAKE 2 TABLETS(20 MG) BY MOUTH TWICE DAILY   JANUMET XR [phone] MG TB24 Take 1 tablet by mouth daily.   metoprolol succinate (TOPROL-XL) 100 MG 24 hr tablet Take 1 tablet (100 mg total) by mouth daily.   Misc. Devices MISC Cpap supplies-tubing mask Dx G47.33 and Z99.89   nitroGLYCERIN (NITROSTAT) 0.4 MG SL tablet PLACE 1 TABLET UNDER THE TONGUE EVERY 5 MINS AS NEEDED FOR CHEST PAIN. MAXIMUM OF 3 DOSES   nystatin cream (MYCOSTATIN) SMARTSIG:Sparingly Topical 2-3 Times Daily PRN   OZEMPIC, 1 MG/DOSE, 4 MG/3ML SOPN Inject 1 mg into the skin once a week.   sacubitril-valsartan (ENTRESTO) 24-26 MG Take 1 tablet by mouth 2 (two) times daily.   Semaglutide,0.25 or 0.5MG/DOS, (OZEMPIC, 0.25 OR 0.5 MG/DOSE,) 2 MG/1.5ML SOPN Inject 0.25 mg into the skin once a week.   sildenafil (VIAGRA) 100 MG tablet Take 100 mg by mouth daily as needed.   spironolactone (ALDACTONE) 25 MG tablet Take 0.5 tablets (12.5 mg total) by mouth  every other day.   triamcinolone ointment (KENALOG) 0.1 % SMARTSIG:sparingly Topical Twice Daily   VENTOLIN HFA 108 (90 Base) MCG/ACT inhaler INHALE 2 PUFFS BY MOUTH EVERY 6 HOURS AS NEEDED FOR WHEEZING OR SHORTNESS OF BREATH   No current facility-administered medications for this visit. (Other)   REVIEW OF SYSTEMS: ROS   Positive for: Endocrine, Cardiovascular, Eyes, Respiratory Negative for: Constitutional, Gastrointestinal, Neurological, Skin, Genitourinary, Musculoskeletal, HENT, Psychiatric, Allergic/Imm, Heme/Lymph Last edited by Kingsley Spittle, COT on 11/29/2021  9:56 AM.      ALLERGIES No Known Allergies  PAST  MEDICAL HISTORY Past Medical History:  Diagnosis Date   5 years ago    AICD (automatic cardioverter/defibrillator) present    Anxiety    Arthritis    Cataract    CHF (congestive heart failure) ([name])    Coronary artery disease    Diabetes mellitus without complication ([name])    Diabetic retinopathy ([place])    Dysrhythmia    GERD (gastroesophageal reflux disease)    PMH   Hypertension    Hypertensive retinopathy    Peripheral vascular disease ([name])    Peripheral vascular disease ([place])    Pneumonia    Shoulder impingement, right    Sleep apnea    wears cpap   Stomach ulcer    Wears glasses    Past Surgical History:  Procedure Laterality Date   AMPUTATION Left 03/12/2019   Procedure: 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;  Surgeon: [name], MD;  Location: [place];  Service: Orthopedics;  Laterality: Left;   AMPUTATION Right 10/01/2020   Procedure: RIGHT 2nd TOE AMPUTATION;  Surgeon: Felipa Furnace, DPM;  Location: [place];  Service: Podiatry;  Laterality: Right;   CARDIAC CATHETERIZATION     CARDIAC DEFIBRILLATOR PLACEMENT     CIRCUMCISION N/A 11/05/2020   Procedure: CIRCUMCISION ADULT;  Surgeon: Irine Seal, MD;  Location: [facility];  Service: Urology;  Laterality: N/A;   I & D EXTREMITY Left 07/19/2016   Procedure: IRRIGATION AND DEBRIDEMENT EXTREMITY/OLECRANON(WASHOUT);  Surgeon: Leandrew Koyanagi, MD;  Location: [place];  Service: Orthopedics;  Laterality: Left;   OLECRANON BURSECTOMY Left 07/19/2016   Procedure: LEFT ELBOW OLECRANON BURSECTOMY;  Surgeon: Leandrew Koyanagi, MD;  Location: [place];  Service: Orthopedics;  Laterality: Left;   SHOULDER ARTHROSCOPY Right 05/16/2019   Procedure: Right Shoulder Arthroscopy;  Surgeon: [name], MD;  Location: [place];  Service: Orthopedics;  Laterality: Right;   FAMILY HISTORY Family History  Problem Relation Age of Onset   Heart disease Mother    Diabetes Mother    Peripheral vascular disease Mother    SOCIAL HISTORY Social History    Tobacco Use   Smoking status: Former    Packs/day: 1.00    Years: 40.00    Total pack years: 40.00    Types: Cigarettes    Quit date: 03/11/2019    Years since quitting: 2.7   Smokeless tobacco: Never  Vaping Use   Vaping Use: Never used  Substance Use Topics   Alcohol use: Not Currently   Drug use: Not Currently    Types: Oxycodone       OPHTHALMIC EXAM:  Base Eye Exam     Visual Acuity (Snellen - Linear)       Right Left   Dist cc 20/20 20/25   Dist ph cc  NI    Correction: Glasses         Tonometry (Tonopen, 10:00 AM)  Right Left   Pressure 15 12         Pupils       Dark Light Shape React APD   Right 3 2 Round Brisk None   Left 3 2 Round Brisk None         Visual Fields (Counting fingers)       Left Right    Full Full         Extraocular Movement       Right Left    Full, Ortho Full, Ortho         Neuro/Psych     Oriented x3: Yes   Mood/Affect: Normal         Dilation     Both eyes: 1.0% Mydriacyl, 2.5% Phenylephrine @ 10:01 AM           Slit Lamp and Fundus Exam     Slit Lamp Exam       Right Left   Lids/Lashes Dermatochalasis - upper lid Dermatochalasis - upper lid   Conjunctiva/Sclera nasal pingeucula nasal pingeucula   Cornea arcus, tear film debris, 1+ PEE arcus, tear film debris, 1+PEE, old subepithelial scars inferior paracentral with haze   Anterior Chamber Deep and quiet Deep and quiet   Iris Round and dilated, No NVI Round and dilated, No NVI   Lens 2-3+ Nuclear sclerosis, 2-3+ Cortical cataract 2-3+ Nuclear sclerosis, 2-3+ Cortical cataract   Anterior Vitreous Vitreous syneresis Vitreous syneresis         Fundus Exam       Right Left   Disc Pink and Sharp, Compact Pink and Sharp, Compact   C/D Ratio 0.1 0.2   Macula Flat, Blunted foveal reflex, + MA / DBH greatest temporal macula, trace cystic changes Flat, Blunted foveal reflex, +MA/DBH, trace cystic changes nasal fovea -- persistent,  scattered cystic changes, prominent blot hem inferior to fovea   Vessels mild attenuation, mild tortuosity attenuated, Tortuous   Periphery Attached; scattered DBH Attached; scattered DBH           Refraction     Wearing Rx       Sphere Cylinder Axis Add   Right +1.25 +1.00 137 +2.50   Left +1.00 +0.50 030 +2.50    Type: PAL           IMAGING AND PROCEDURES  Imaging and Procedures for 11/29/2021  OCT, Retina - OU - Both Eyes       Right Eye Quality was good. Central Foveal Thickness: 247. Progression has improved. Findings include normal foveal contour, no SRF, intraretinal fluid (Persistent IRF / non-central cystic changes -- slightly improved temporal and SN macula).   Left Eye Quality was good. Progression has been stable. Findings include normal foveal contour, no SRF, intraretinal hyper-reflective material, intraretinal fluid (Focal cystic changes / IRHM nasal fovea -- persistent, scattered, non-central IRF/cystic changes).   Notes *Images captured and stored on drive  Diagnosis / Impression:  OD: Persistent IRF / non-central cystic changes -- slightly improved temporal and SN macula OS: Focal cystic changes / IRHM nasal fovea -- persistent, scattered, non-central IRF/cystic changes  Clinical management:  See below  Abbreviations: NFP - Normal foveal profile. CME - cystoid macular edema. PED - pigment epithelial detachment. IRF - intraretinal fluid. SRF - subretinal fluid. EZ - ellipsoid zone. ERM - epiretinal membrane. ORA - outer retinal atrophy. ORT - outer retinal tubulation. SRHM - subretinal hyper-reflective material. IRHM - intraretinal hyper-reflective material      Intravitreal Injection, Pharmacologic  Agent - OS - Left Eye       Time Out 11/29/2021. 10:41 AM. Confirmed correct patient, procedure, site, and patient consented.   Anesthesia Topical anesthesia was used. Anesthetic medications included Lidocaine 2%, Proparacaine 0.5%.    Procedure Preparation included 5% betadine to ocular surface, eyelid speculum. A (32g) needle was used.   Injection: 1.25 mg Bevacizumab 1.60m/0.05ml   Route: Intravitreal, Site: Left Eye   NDC:: 17408-144-81 Lot:: [id number] Expiration date: 01/03/2022   Post-op Post injection exam found visual acuity of at least counting fingers. The patient tolerated the procedure well. There were no complications. The patient received written and verbal post procedure care education. Post injection medications were not given.            ASSESSMENT/PLAN:    ICD-10-CM   1. Moderate nonproliferative diabetic retinopathy of both eyes with macular edema associated with type 2 diabetes mellitus ([name])  E11.3313 OCT, Retina - OU - Both Eyes    Intravitreal Injection, Pharmacologic Agent - OS - Left Eye    Bevacizumab (AVASTIN) SOLN 1.25 mg    2. Essential hypertension  I10     3. Hypertensive retinopathy of both eyes  H35.033     4. Combined forms of age-related cataract of both eyes  H25.813       1. Moderate nonproliferative diabetic retinopathy w/ DME OU - s/p IVA OS #1 (07.25.23), #2 (08.22.23)  - A1c 8.1 09/2021 per pt report, 8.8 on 02.15.23 in Care Everywhere - stressed to the patient getting the A1C under 7 could help improve vision. - pt working with Endodrinologist and blood glucose improving  - exam shows scattered MA OU - FA (06.21.22) shows late leaking MA, no NV OU - OCT OD: Persistent IRF / non-central cystic changes -- slightly improved temporal and SN macula; OS: Focal cystic changes / IRHM nasal fovea -- persistent, scattered, non-central IRF/cystic changes - BCVA OD 20/20 (stable), OS 20/25 decreased - recommend IVA OS #3 today (09.19.23) - RBA of procedure discussed, questions answered - Avastin informed consent obtained and signed 07.25.23 - see procedure note  - f/u in 4 weeks -- DFE/OCT, possible injection  2,3. Hypertensive retinopathy OU - discussed importance of  tight BP control - continue to monitor  4. Mixed Cataract OU - The symptoms of cataract, surgical options, and treatments and risks were discussed with patient. - discussed diagnosis and progression - continue to monitor  Ophthalmic Meds Ordered this visit:  Meds ordered this encounter  Medications   Bevacizumab (AVASTIN) SOLN 1.25 mg      Return in about 4 weeks (around 12/27/2021) for f/u NPDR OU, DFE, OCT.  There are no Patient Instructions on file for this visit.  Explained the diagnoses, plan, and follow up with the patient and they expressed understanding.  Patient expressed understanding of the importance of proper follow up care.   This document serves as a record of services personally performed by BGardiner Sleeper MD, PhD. It was created on their behalf by CRenaldo Reel [place] ophthalmic technician. The creation of this record is the provider's dictation and/or activities during the visit.    Electronically signed by:  CRenaldo Reel COT  11/17/21 12:37 PM  This document serves as a record of services personally performed by BGardiner Sleeper MD, PhD. It was created on their behalf by ASan Jetty BOwens Shark OA an ophthalmic technician. The creation of this record is the provider's dictation and/or activities during the visit.    Electronically  signed by: San Jetty. Marguerita Merles 09.19.2023 12:37 PM   Gardiner Sleeper, M.D., Ph.D. Diseases & Surgery of the Retina and Vitreous [facility]   I have reviewed the above documentation for accuracy and completeness, and I agree with the above. Gardiner Sleeper, M.D., Ph.D. 11/29/21 12:37 PM   Abbreviations: M myopia (nearsighted); A astigmatism; H hyperopia (farsighted); P presbyopia; Mrx spectacle prescription;  CTL contact lenses; OD right eye; OS left eye; OU both eyes  XT exotropia; ET esotropia; PEK punctate epithelial keratitis; PEE punctate epithelial erosions; DES dry eye syndrome; MGD meibomian  gland dysfunction; ATs artificial tears; PFAT's preservative free artificial tears; [place] nuclear sclerotic cataract; PSC posterior subcapsular cataract; ERM epi-retinal membrane; PVD posterior vitreous detachment; RD retinal detachment; DM diabetes mellitus; DR diabetic retinopathy; NPDR non-proliferative diabetic retinopathy; PDR proliferative diabetic retinopathy; CSME clinically significant macular edema; DME diabetic macular edema; dbh dot blot hemorrhages; CWS cotton wool spot; POAG primary open angle glaucoma; C/D cup-to-disc ratio; HVF humphrey visual field; GVF goldmann visual field; OCT optical coherence tomography; IOP intraocular pressure; BRVO Branch retinal vein occlusion; CRVO central retinal vein occlusion; CRAO central retinal artery occlusion; BRAO branch retinal artery occlusion; RT retinal tear; SB scleral buckle; PPV pars plana vitrectomy; VH Vitreous hemorrhage; PRP panretinal laser photocoagulation; IVK intravitreal kenalog; VMT vitreomacular traction; MH Macular hole;  NVD neovascularization of the disc; NVE neovascularization elsewhere; AREDS age related eye disease study; ARMD age related macular degeneration; POAG primary open angle glaucoma; EBMD epithelial/anterior basement membrane dystrophy; ACIOL anterior chamber intraocular lens; IOL intraocular lens; PCIOL posterior chamber intraocular lens; Phaco/IOL phacoemulsification with intraocular lens placement; [place] photorefractive keratectomy; LASIK laser assisted in situ keratomileusis; HTN hypertension; DM diabetes mellitus; COPD chronic obstructive pulmonary disease

## 2021-11-23 ENCOUNTER — Encounter: Payer: Self-pay | Admitting: Family

## 2021-11-23 ENCOUNTER — Ambulatory Visit (INDEPENDENT_AMBULATORY_CARE_PROVIDER_SITE_OTHER): Payer: Medicare Other | Admitting: Family

## 2021-11-23 DIAGNOSIS — M75121 Complete rotator cuff tear or rupture of right shoulder, not specified as traumatic: Secondary | ICD-10-CM

## 2021-11-23 DIAGNOSIS — M1712 Unilateral primary osteoarthritis, left knee: Secondary | ICD-10-CM | POA: Diagnosis not present

## 2021-11-23 DIAGNOSIS — M25511 Pain in right shoulder: Secondary | ICD-10-CM | POA: Diagnosis not present

## 2021-11-23 DIAGNOSIS — G8929 Other chronic pain: Secondary | ICD-10-CM

## 2021-11-23 MED ORDER — LIDOCAINE HCL 1 % IJ SOLN
5.0000 mL | INTRAMUSCULAR | Status: AC | PRN
  Administered 2021-11-23: 5 mL

## 2021-11-23 MED ORDER — METHYLPREDNISOLONE ACETATE 40 MG/ML IJ SUSP
40.0000 mg | INTRAMUSCULAR | Status: AC | PRN
  Administered 2021-11-23: 40 mg via INTRA_ARTICULAR

## 2021-11-23 NOTE — Progress Notes (Signed)
Office Visit Note   [name]           Date of Birth: Oct 01, 1955           MRN: [id number] Visit Date: 11/23/2021              Requested by: No referring provider defined for this encounter. PCP: Pcp, No  Chief Complaint  Patient presents with   Right Shoulder - Pain   Left Knee - Pain      HPI: The patient is a 66 year old gentleman seen today in routine follow-up for right shoulder pain and left knee pain.  He is seen in follow-up for his right shoulder he is currently awaiting a reverse total shoulder with Dr. August Saucer but is working on bringing his A1c down to 8 or below.  Is working on a new exercise program for the last 4 weeks and would like to wait 2 more months before checking his A1c  He is complaining of chronic pain loss of range of motion especially reaching above head some weakness as well he is right-hand dominant. Requesting repeat right shoulder injection.  Left knee has been giving him more trouble today denies mechanical symptoms. significant pain with weight bearing and flexing.  He has had good relief with Depo-Medrol injections of both joints in the past is requesting repeat injection today  Assessment & Plan: Visit Diagnoses: No diagnosis found.  Plan: Depo-Medrol injection right shoulder and left knee.  Patient tolerated well.  Follow-Up Instructions: No follow-ups on file.   Left Knee Exam   Muscle Strength  The patient has normal left knee strength.  Tenderness  The patient is experiencing tenderness in the medial joint line.  Range of Motion  The patient has normal left knee ROM.  Tests  Varus: negative Valgus: negative  Other  Erythema: absent Swelling: none  Comments:  Crepitation with rom      Patient is alert, oriented, no adenopathy, well-dressed, normal affect, normal respiratory effort. Examination patient has abduction and flexion to about 90 degrees of the right shoulder there is crepitation with range of  motion he has good internal and external rotation passively range of motion to 120 degrees no adhesive capsulitis.    Imaging: No results found. No images are attached to the encounter.  Labs: Lab Results  Component Value Date   HGBA1C 8.7 (H) 10/19/2020   HGBA1C 9.8 (H) 09/08/2020   HGBA1C 11.0 (A) 07/23/2020   ESRSEDRATE 11 03/11/2019   CRP 1.8 (H) 03/11/2019   REPTSTATUS 10/04/2020 FINAL 09/29/2020   GRAMSTAIN  07/19/2016    RARE WBC PRESENT, PREDOMINANTLY MONONUCLEAR NO ORGANISMS SEEN    CULT  09/29/2020    NO GROWTH 5 DAYS Performed at [facility], [address] [place], [place] [address]      Lab Results  Component Value Date   ALBUMIN 3.5 09/29/2020   ALBUMIN 4.2 04/26/2020   ALBUMIN 3.7 03/11/2019    No results found for: "MG" No results found for: "VD25OH"  No results found for: "PREALBUMIN"    Latest Ref Rng & Units 10/19/2020   10:59 AM 10/01/2020    5:08 AM 09/30/2020    4:56 AM  CBC EXTENDED  WBC 4.0 - 10.5 K/uL 8.0  7.4  8.4   RBC 4.22 - 5.81 MIL/uL 4.45  4.32  3.99   Hemoglobin 13.0 - 17.0 g/dL 78.2  42.3  53.6   HCT 39.0 - 52.0 % 42.3  40.1  36.6  Platelets 150 - 400 K/uL 183  202  170      There is no height or weight on file to calculate BMI.  Orders:  No orders of the defined types were placed in this encounter.  No orders of the defined types were placed in this encounter.    Procedures: Large Joint Inj: L knee on 11/23/2021 9:58 AM Indications: pain Details: 18 G 1.5 in needle, anteromedial approach Medications: 5 mL lidocaine 1 %; 40 mg methylPREDNISolone acetate 40 MG/ML Consent was given by the patient.    Large Joint Inj: R subacromial bursa on 11/23/2021 9:58 AM Indications: pain Details: 22 G 1.5 in needle Medications: 5 mL lidocaine 1 %; 40 mg methylPREDNISolone acetate 40 MG/ML Consent was given by the patient.      Clinical Data: No additional findings.  ROS:  All other systems negative, except as noted  in the HPI. Review of Systems  Objective: Vital Signs: There were no vitals taken for this visit.  Specialty Comments:  No specialty comments available.  PMFS History: Patient Active Problem List   Diagnosis Date Noted   Diabetic neuropathy ([name]) 10/31/2021   Pain due to onychomycosis of toenails of both feet 01/21/2021   Phimosis of penis 10/06/2020   Cellulitis 09/29/2020   Cellulitis of right toe 09/29/2020   Chronic combined systolic and diastolic heart failure ([name]) 09/15/2020   Onychomycosis of toenail 08/24/2020   Diabetes mellitus ([name]) 07/23/2020   Type 2 diabetes mellitus with hyperglycemia, without long-term current use of insulin ([name]) 07/23/2020   Weight gain 07/23/2020   Type 2 diabetes mellitus with peripheral neuropathy ([name]) 07/23/2020   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 diabetes mellitus ([name]) 05/04/2020   Hypertensive heart disease with heart failure ([name]) 04/26/2020   Type 2 diabetes mellitus with diabetic peripheral angiopathy without gangrene, without long-term current use of insulin ([name]) 04/26/2020   OSA on CPAP 04/26/2020   PAD (peripheral artery disease) ([name]) 04/26/2020   CAD in native artery 04/26/2020   Class 2 severe obesity due to excess calories with serious comorbidity and body mass index (BMI) of 38.0 to 38.9 in adult [facility]) 04/26/2020   Impingement syndrome of right shoulder    Nontraumatic complete tear of right rotator cuff    Type 2 diabetes mellitus with foot ulcer, without long-term current use of insulin ([name])    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   S/P debridement 07/19/2016   Acute gout of left elbow 07/19/2016   Past Medical History:  Diagnosis Date   5 years ago    AICD (automatic cardioverter/defibrillator) present    Anxiety    Arthritis    Cataract    CHF (congestive heart failure) ([name])    Coronary artery disease    Diabetes mellitus without complication ([name])    Diabetic retinopathy ([name])     Dysrhythmia    GERD (gastroesophageal reflux disease)    PMH   Hypertension    Hypertensive retinopathy    Peripheral vascular disease ([name])    Peripheral vascular disease ([name])    Pneumonia    Shoulder impingement, right    Sleep apnea    wears cpap   Stomach ulcer    Wears glasses     Family History  Problem Relation Age of Onset   Heart disease Mother    Diabetes Mother    Peripheral vascular disease Mother     Past Surgical History:  Procedure Laterality Date   AMPUTATION Left 03/12/2019  Procedure: 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;  Surgeon: [name], MD;  [facility];  Service: Orthopedics;  Laterality: Left;   AMPUTATION Right 10/01/2020   Procedure: RIGHT 2nd TOE AMPUTATION;  Surgeon: [name], DPM;  Location: [facility];  Service: Podiatry;  Laterality: Right;   CARDIAC CATHETERIZATION     CARDIAC DEFIBRILLATOR PLACEMENT     CIRCUMCISION N/A 11/05/2020   Procedure: CIRCUMCISION ADULT;  Surgeon: [name], MD;  Location: [facility];  Service: Urology;  Laterality: N/A;   I & D EXTREMITY Left 07/19/2016   Procedure: IRRIGATION AND DEBRIDEMENT EXTREMITY/OLECRANON(WASHOUT);  Surgeon: Tarry Kos, MD;  Location[facility];  Service: Orthopedics;  Laterality: Left;   OLECRANON BURSECTOMY Left 07/19/2016   Procedure: LEFT ELBOW OLECRANON BURSECTOMY;  Surgeon: Tarry Kos, MD;  Location: [facility];  Service: Orthopedics;  Laterality: Left;   SHOULDER ARTHROSCOPY Right 05/16/2019   Procedure: Right Shoulder Arthroscopy;  Surgeon: [name], MD;  Location: [facility] OR;  Service: Orthopedics;  Laterality: Right;   Social History   Occupational History   Not on file  Tobacco Use   Smoking status: Former    Packs/day: 1.00    Years: 40.00    Total pack years: 40.00    Types: Cigarettes    Quit date: 03/11/2019    Years since quitting: 2.7   Smokeless tobacco: Never  Vaping Use   Vaping Use: Never used  Substance and Sexual Activity   Alcohol use: Not Currently    Drug use: Not Currently    Types: Oxycodone   Sexual activity: Not on file

## 2021-11-29 ENCOUNTER — Encounter (INDEPENDENT_AMBULATORY_CARE_PROVIDER_SITE_OTHER): Payer: Self-pay | Admitting: Ophthalmology

## 2021-11-29 ENCOUNTER — Ambulatory Visit (INDEPENDENT_AMBULATORY_CARE_PROVIDER_SITE_OTHER): Payer: Medicare Other | Admitting: Ophthalmology

## 2021-11-29 DIAGNOSIS — E113313 Type 2 diabetes mellitus with moderate nonproliferative diabetic retinopathy with macular edema, bilateral: Secondary | ICD-10-CM | POA: Diagnosis not present

## 2021-11-29 DIAGNOSIS — I10 Essential (primary) hypertension: Secondary | ICD-10-CM | POA: Diagnosis not present

## 2021-11-29 DIAGNOSIS — H35033 Hypertensive retinopathy, bilateral: Secondary | ICD-10-CM

## 2021-11-29 DIAGNOSIS — H25813 Combined forms of age-related cataract, bilateral: Secondary | ICD-10-CM | POA: Diagnosis not present

## 2021-11-29 MED ORDER — BEVACIZUMAB CHEMO INJECTION 1.25MG/0.05ML SYRINGE FOR KALEIDOSCOPE
1.2500 mg | INTRAVITREAL | Status: AC | PRN
  Administered 2021-11-29: 1.25 mg via INTRAVITREAL

## 2021-12-06 ENCOUNTER — Ambulatory Visit (INDEPENDENT_AMBULATORY_CARE_PROVIDER_SITE_OTHER): Payer: Medicaid Other

## 2021-12-06 DIAGNOSIS — I5042 Chronic combined systolic (congestive) and diastolic (congestive) heart failure: Secondary | ICD-10-CM

## 2021-12-06 LAB — CUP PACEART REMOTE DEVICE CHECK
Battery Remaining Longevity: 60 mo
Battery Voltage: 2.98 V
Brady Statistic AP VP Percent: 0 %
Brady Statistic AP VS Percent: 0.25 %
Brady Statistic AS VP Percent: 0.03 %
Brady Statistic AS VS Percent: 99.72 %
Brady Statistic RA Percent Paced: 0.26 %
Brady Statistic RV Percent Paced: 0.03 %
Date Time Interrogation Session: 20230925022504
HighPow Impedance: 81 Ohm
Implantable Lead Implant Date: 20180702
Implantable Lead Implant Date: 20180702
Implantable Lead Location: 753859
Implantable Lead Location: 753860
Implantable Lead Model: 5076
Implantable Pulse Generator Implant Date: 20180702
Lead Channel Impedance Value: 437 Ohm
Lead Channel Impedance Value: 456 Ohm
Lead Channel Impedance Value: 532 Ohm
Lead Channel Pacing Threshold Amplitude: 0.375 V
Lead Channel Pacing Threshold Amplitude: 0.625 V
Lead Channel Pacing Threshold Pulse Width: 0.4 ms
Lead Channel Pacing Threshold Pulse Width: 0.4 ms
Lead Channel Sensing Intrinsic Amplitude: 4 mV
Lead Channel Sensing Intrinsic Amplitude: 4 mV
Lead Channel Sensing Intrinsic Amplitude: 5.125 mV
Lead Channel Sensing Intrinsic Amplitude: 5.125 mV
Lead Channel Setting Pacing Amplitude: 1.5 V
Lead Channel Setting Pacing Amplitude: 2 V
Lead Channel Setting Pacing Pulse Width: 0.4 ms
Lead Channel Setting Sensing Sensitivity: 0.3 mV

## 2021-12-14 NOTE — Progress Notes (Signed)
[facility] - Clinic Note  12/27/2021     CHIEF COMPLAINT Patient presents for Retina Follow Up  HISTORY OF PRESENT ILLNESS: Jon Jones is a 66 y.o. male who presents to the clinic today for:   HPI     Retina Follow Up   Patient presents with  Diabetic Retinopathy.  In both eyes.  This started years ago.  Severity is moderate.  Duration of 4 weeks.  Since onset it is stable.  I, the attending physician,  performed the HPI with the patient and updated documentation appropriately.        Comments   Patient feels that the vision is the same. His blood sugar was 180 and he is unsure of his A1C.      Last edited by Rennis ChrisZamora, Marice Guidone, MD on 12/27/2021  2:01 PM.     [place].  Referring physician: Alma DownsAlbert Lundquist, PA-C No referring provider defined for this encounter.  HISTORICAL INFORMATION:   Selected notes from the MEDICAL RECORD NUMBER Referred by Alma DownsAlbert Lundquist, PA for eval of diabetic retinopathy OU; DME OS   CURRENT MEDICATIONS: Current Outpatient Medications (Ophthalmic Drugs)  Medication Sig   RESTASIS 0.05 % ophthalmic emulsion Place 1 drop into both eyes in the morning and at bedtime.   No current facility-administered medications for this visit. (Ophthalmic Drugs)   Current Outpatient Medications (Other)  Medication Sig   ACCU-CHEK AVIVA PLUS test strip TEST 3 TIMES DAILY   Accu-Chek Softclix Lancets lancets CHECK BLOOD SUGAR ONCE DAILY   acetaminophen (TYLENOL) 325 MG tablet Take 2 tablets (650 mg total) by mouth every 6 (six) hours as needed for mild pain (or Fever >/= 101).   aspirin 81 MG EC tablet Take 81 mg by mouth daily.   atorvastatin (LIPITOR) 80 MG tablet Take 1 tablet (80 mg total) by mouth every evening.   ciclopirox (PENLAC) 8 % solution Apply topically at bedtime. Apply over nail and surrounding skin. Apply daily over previous coat. After seven (7) days, may remove with alcohol and continue cycle.    clobetasol cream (TEMOVATE) 0.05 % Apply 1 application topically 2 (two) times daily.   clopidogrel (PLAVIX) 75 MG tablet TAKE 1 TABLET BY MOUTH EVERY DAY   empagliflozin (JARDIANCE) 25 MG TABS tablet TAKE 1 TABLET(25 MG) BY MOUTH AT BEDTIME   gabapentin (NEURONTIN) 300 MG capsule TAKE 1 CAPSULE(300 MG) BY MOUTH AT BEDTIME   glipiZIDE (GLUCOTROL) 10 MG tablet TAKE 2 TABLETS(20 MG) BY MOUTH TWICE DAILY   JANUMET XR [phone] MG TB24 Take 1 tablet by mouth daily.   metoprolol succinate (TOPROL-XL) 100 MG 24 hr tablet Take 1 tablet (100 mg total) by mouth daily.   Misc. Devices MISC Cpap supplies-tubing mask Dx G47.33 and Z99.89   nitroGLYCERIN (NITROSTAT) 0.4 MG SL tablet PLACE 1 TABLET UNDER THE TONGUE EVERY 5 MINS AS NEEDED FOR CHEST PAIN. MAXIMUM OF 3 DOSES   nystatin cream (MYCOSTATIN) SMARTSIG:Sparingly Topical 2-3 Times Daily PRN   OZEMPIC, 1 MG/DOSE, 4 MG/3ML SOPN Inject 1 mg into the skin once a week.   sacubitril-valsartan (ENTRESTO) 24-26 MG Take 1 tablet by mouth 2 (two) times daily.   Semaglutide,0.25 or 0.5MG /DOS, (OZEMPIC, 0.25 OR 0.5 MG/DOSE,) 2 MG/1.5ML SOPN Inject 0.25 mg into the skin once a week.   sildenafil (VIAGRA) 100 MG tablet Take 100 mg by mouth daily as needed.   spironolactone (ALDACTONE) 25 MG tablet Take 0.5 tablets (12.5 mg total) by mouth  every other day.   triamcinolone ointment (KENALOG) 0.1 % SMARTSIG:sparingly Topical Twice Daily   VENTOLIN HFA 108 (90 Base) MCG/ACT inhaler INHALE 2 PUFFS BY MOUTH EVERY 6 HOURS AS NEEDED FOR WHEEZING OR SHORTNESS OF BREATH   No current facility-administered medications for this visit. (Other)   REVIEW OF SYSTEMS: ROS   Positive for: Endocrine, Cardiovascular, Eyes, Respiratory Negative for: Constitutional, Gastrointestinal, Neurological, Skin, Genitourinary, Musculoskeletal, HENT, Psychiatric, Allergic/Imm, Heme/Lymph Last edited by Julieanne Cotton, COT on 12/27/2021  9:13 AM.     ALLERGIES No Known  Allergies  PAST MEDICAL HISTORY Past Medical History:  Diagnosis Date   5 years ago    AICD (automatic cardioverter/defibrillator) present    Anxiety    Arthritis    Cataract    CHF (congestive heart failure) ([name])    Coronary artery disease    Diabetes mellitus without complication ([name])    Diabetic retinopathy ([name])    Dysrhythmia    GERD (gastroesophageal reflux disease)    PMH   Hypertension    Hypertensive retinopathy    Peripheral vascular disease ([name])    Peripheral vascular disease ([name])    Pneumonia    Shoulder impingement, right    Sleep apnea    wears cpap   Stomach ulcer    Wears glasses    Past Surgical History:  Procedure Laterality Date   AMPUTATION Left 03/12/2019   Procedure: 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;  Surgeon: [name], MD;  Location: [facility] OR;  Service: Orthopedics;  Laterality: Left;   AMPUTATION Right 10/01/2020   Procedure: RIGHT 2nd TOE AMPUTATION;  Surgeon: [name], DPM;  Location: [facility] OR;  Service: Podiatry;  Laterality: Right;   CARDIAC CATHETERIZATION     CARDIAC DEFIBRILLATOR PLACEMENT     CIRCUMCISION N/A 11/05/2020   Procedure: CIRCUMCISION ADULT;  Surgeon: [name], MD;  Location: [facility] ORS;  Service: Urology;  Laterality: N/A;   I & D EXTREMITY Left 07/19/2016   Procedure: IRRIGATION AND DEBRIDEMENT EXTREMITY/OLECRANON(WASHOUT);  Surgeon: Tarry Kos, MD;  Location[facility] OR;  Service: Orthopedics;  Laterality: Left;   OLECRANON BURSECTOMY Left 07/19/2016   Procedure: LEFT ELBOW OLECRANON BURSECTOMY;  Surgeon: Tarry Kos, MD;  Location: [facility];  Service: Orthopedics;  Laterality: Left;   SHOULDER ARTHROSCOPY Right 05/16/2019   Procedure: Right Shoulder Arthroscopy;  Surgeon: [name], MD;  Location: [facility] OR;  Service: Orthopedics;  Laterality: Right;   FAMILY HISTORY Family History  Problem Relation Age of Onset   Heart disease Mother    Diabetes Mother    Peripheral vascular disease Mother    SOCIAL  HISTORY Social History   Tobacco Use   Smoking status: Former    Packs/day: 1.00    Years: 40.00    Total pack years: 40.00    Types: Cigarettes    Quit date: 03/11/2019    Years since quitting: 2.8   Smokeless tobacco: Never  Vaping Use   Vaping Use: Never used  Substance Use Topics   Alcohol use: Not Currently   Drug use: Not Currently    Types: Oxycodone       OPHTHALMIC EXAM:  Base Eye Exam     Visual Acuity (Snellen - Linear)       Right Left   Dist cc 20/20 20/25    Correction: Glasses         Tonometry (Tonopen, 9:16 AM)       Right Left   Pressure 15 15  Pupils       Dark Light Shape React APD   Right 3 2 Round Brisk None   Left 3 2 Round Brisk None         Visual Fields       Left Right    Full Full         Extraocular Movement       Right Left    Full, Ortho Full, Ortho         Neuro/Psych     Oriented x3: Yes   Mood/Affect: Normal         Dilation     Both eyes: 1.0% Mydriacyl, 2.5% Phenylephrine @ 9:13 AM           Slit Lamp and Fundus Exam     Slit Lamp Exam       Right Left   Lids/Lashes Dermatochalasis - upper lid Dermatochalasis - upper lid   Conjunctiva/Sclera nasal pingeucula nasal pingeucula   Cornea arcus, tear film debris, 1+ PEE arcus, tear film debris, 1+PEE, old subepithelial scars inferior paracentral with haze   Anterior Chamber Deep and quiet Deep and quiet   Iris Round and dilated, No NVI Round and dilated, No NVI   Lens 2-3+ Nuclear sclerosis, 2-3+ Cortical cataract 2-3+ Nuclear sclerosis, 2-3+ Cortical cataract   Anterior Vitreous Vitreous syneresis Vitreous syneresis         Fundus Exam       Right Left   Disc Pink and Sharp, Compact Pink and Sharp, Compact   C/D Ratio 0.1 0.2   Macula Flat, Blunted foveal reflex, + MA / DBH greatest temporal macula, trace cystic changes Flat, Blunted foveal reflex, +MA/DBH, trace cystic changes nasal fovea -- persistent, scattered cystic  changes, prominent blot hem inferior to fovea   Vessels mild attenuation, mild tortuosity attenuated, Tortuous   Periphery Attached; scattered DBH Attached; scattered DBH           Refraction     Wearing Rx       Sphere Cylinder Axis Add   Right +1.25 +1.00 137 +2.50   Left +1.00 +0.50 030 +2.50    Type: PAL           IMAGING AND PROCEDURES  Imaging and Procedures for 12/27/2021  OCT, Retina - OU - Both Eyes       Right Eye Quality was good. Central Foveal Thickness: 246. Progression has been stable. Findings include normal foveal contour, no SRF, intraretinal fluid (Persistent IRF / non-central cystic changes -- slightly improved temporal and SN macula).   Left Eye Quality was good. Central Foveal Thickness: 237. Progression has been stable. Findings include normal foveal contour, no SRF, intraretinal hyper-reflective material, intraretinal fluid (Focal cystic changes / IRHM nasal fovea -- persistent/slightly improved, scattered, non-central IRF/cystic changes).   Notes *Images captured and stored on drive  Diagnosis / Impression:  OD: Persistent IRF / non-central cystic changes -- slightly improved temporal and SN macula OS: Focal cystic changes / IRHM nasal fovea -- persistent/slightly improved, scattered, non-central IRF/cystic changes  Clinical management:  See below  Abbreviations: NFP - Normal foveal profile. CME - cystoid macular edema. PED - pigment epithelial detachment. IRF - intraretinal fluid. SRF - subretinal fluid. EZ - ellipsoid zone. ERM - epiretinal membrane. ORA - outer retinal atrophy. ORT - outer retinal tubulation. SRHM - subretinal hyper-reflective material. IRHM - intraretinal hyper-reflective material      Intravitreal Injection, Pharmacologic Agent - OS - Left Eye  Time Out 12/27/2021. 10:42 AM. Confirmed correct patient, procedure, site, and patient consented.   Anesthesia Topical anesthesia was used. Anesthetic medications  included Lidocaine 2%, Proparacaine 0.5%.   Procedure Preparation included 5% betadine to ocular surface, eyelid speculum. A supplied (32g) needle was used.   Injection: 1.25 mg Bevacizumab 1.25mg /0.29ml   Route: Intravitreal, Site: Left Eye   NDC: 22297-989-21, Lot: [id number] , Expiration date: 01/11/2022   Post-op Post injection exam found visual acuity of at least counting fingers. The patient tolerated the procedure well. There were no complications. The patient received written and verbal post procedure care education. Post injection medications were not given.            ASSESSMENT/PLAN:    ICD-10-CM   1. Moderate nonproliferative diabetic retinopathy of both eyes with macular edema associated with type 2 diabetes mellitus ([name])  E11.3313 OCT, Retina - OU - Both Eyes    Intravitreal Injection, Pharmacologic Agent - OS - Left Eye    Bevacizumab (AVASTIN) SOLN 1.25 mg    2. Essential hypertension  I10     3. Hypertensive retinopathy of both eyes  H35.033     4. Combined forms of age-related cataract of both eyes  H25.813      1. Moderate nonproliferative diabetic retinopathy w/ DME OU - s/p IVA OS #1 (07.25.23), #2 (08.22.23), #3 (09.19.23)  - A1c 8.1 09/2021 per pt report, 8.8 on 02.15.23 in Care Everywhere - stressed to the patient getting the A1C under 7 could help improve vision. - pt working with Endodrinologist and blood glucose improving  - exam shows scattered MA OU - FA (06.21.22) shows late leaking MA, no NV OU - OCT OD: Persistent IRF / non-central cystic changes -- slightly improved temporal and SN macula; OS: Focal cystic changes / IRHM nasal fovea -- persistent, scattered, non-central IRF/cystic changes at 4 wks - BCVA OD 20/20, OS 20/25 -- stable - recommend IVA OS #4 today 10.17.23 - RBA of procedure discussed, questions answered - Avastin informed consent obtained and signed 07.25.23 - see procedure note  - f/u in 4-5 weeks -- DFE/OCT, possible  injection  2,3. Hypertensive retinopathy OU - discussed importance of tight BP control - continue to monitor  4. Mixed Cataract OU - The symptoms of cataract, surgical options, and treatments and risks were discussed with patient. - discussed diagnosis and progression - continue to monitor  Ophthalmic Meds Ordered this visit:  Meds ordered this encounter  Medications   Bevacizumab (AVASTIN) SOLN 1.25 mg     Return for 4-5 wks NPDR OU, DFE, OCT, likely injection.  There are no Patient Instructions on file for this visit.  Explained the diagnoses, plan, and follow up with the patient and they expressed understanding.  Patient expressed understanding of the importance of proper follow up care.   This document serves as a record of services personally performed by 07.27.23, MD, PhD. It was created on their behalf by Karie Chimera, COT an ophthalmic technician. The creation of this record is the provider's dictation and/or activities during the visit.    Electronically signed by:  Gerilyn Nestle, COT  10.04.23 2:01 PM  12.04.23, M.D., Ph.D. Diseases & Surgery of the Retina and Vitreous [facility]  I have reviewed the above documentation for accuracy and completeness, and I agree with the above. [facility], M.D., Ph.D. 12/27/21 2:02 PM  Abbreviations: M myopia (nearsighted); A astigmatism; H hyperopia (farsighted); P presbyopia; Mrx spectacle prescription;  CTL contact lenses; OD right eye; OS left eye; OU both eyes  XT exotropia; ET esotropia; PEK punctate epithelial keratitis; PEE punctate epithelial erosions; DES dry eye syndrome; MGD meibomian gland dysfunction; ATs artificial tears; PFAT's preservative free artificial tears; NSC nuclear sclerotic cataract; PSC posterior subcapsular cataract; ERM epi-retinal membrane; PVD posterior vitreous detachment; RD retinal detachment; DM diabetes mellitus; DR diabetic retinopathy; NPDR  non-proliferative diabetic retinopathy; PDR proliferative diabetic retinopathy; CSME clinically significant macular edema; DME diabetic macular edema; dbh dot blot hemorrhages; CWS cotton wool spot; POAG primary open angle glaucoma; C/D cup-to-disc ratio; HVF humphrey visual field; GVF goldmann visual field; OCT optical coherence tomography; IOP intraocular pressure; BRVO Branch retinal vein occlusion; CRVO central retinal vein occlusion; CRAO central retinal artery occlusion; BRAO branch retinal artery occlusion; RT retinal tear; SB scleral buckle; PPV pars plana vitrectomy; VH Vitreous hemorrhage; PRP panretinal laser photocoagulation; IVK intravitreal kenalog; VMT vitreomacular traction; MH Macular hole;  NVD neovascularization of the disc; NVE neovascularization elsewhere; AREDS age related eye disease study; ARMD age related macular degeneration; POAG primary open angle glaucoma; EBMD epithelial/anterior basement membrane dystrophy; ACIOL anterior chamber intraocular lens; IOL intraocular lens; PCIOL posterior chamber intraocular lens; Phaco/IOL phacoemulsification with intraocular lens placement; PRK photorefractive keratectomy; LASIK laser assisted in situ keratomileusis; HTN hypertension; DM diabetes mellitus; COPD chronic obstructive pulmonary disease

## 2021-12-19 NOTE — Progress Notes (Signed)
Remote ICD transmission.   

## 2021-12-27 ENCOUNTER — Encounter (INDEPENDENT_AMBULATORY_CARE_PROVIDER_SITE_OTHER): Payer: Self-pay | Admitting: Ophthalmology

## 2021-12-27 ENCOUNTER — Ambulatory Visit (INDEPENDENT_AMBULATORY_CARE_PROVIDER_SITE_OTHER): Payer: Medicare Other | Admitting: Ophthalmology

## 2021-12-27 DIAGNOSIS — H25813 Combined forms of age-related cataract, bilateral: Secondary | ICD-10-CM

## 2021-12-27 DIAGNOSIS — I10 Essential (primary) hypertension: Secondary | ICD-10-CM

## 2021-12-27 DIAGNOSIS — E113313 Type 2 diabetes mellitus with moderate nonproliferative diabetic retinopathy with macular edema, bilateral: Secondary | ICD-10-CM

## 2021-12-27 DIAGNOSIS — H35033 Hypertensive retinopathy, bilateral: Secondary | ICD-10-CM | POA: Diagnosis not present

## 2021-12-27 MED ORDER — BEVACIZUMAB CHEMO INJECTION 1.25MG/0.05ML SYRINGE FOR KALEIDOSCOPE
1.2500 mg | INTRAVITREAL | Status: AC | PRN
  Administered 2021-12-27: 1.25 mg via INTRAVITREAL

## 2022-01-24 NOTE — Progress Notes (Signed)
[facility] Note  02/07/2022     CHIEF COMPLAINT Patient presents for Retina Follow Up  HISTORY OF PRESENT ILLNESS: Jon Jones is a 66 y.o. male who presents to the clinic today for:   HPI     Retina Follow Up   Patient presents with  Diabetic Retinopathy.  In both eyes.  This started years ago.  Severity is moderate.  Duration of 5 weeks.  Since onset it is stable.  I, the attending physician,  performed the HPI with the patient and updated documentation appropriately.        Comments   Patient states that the vision is the same. He is not using any eye drops at this time. His blood sugar was 158 and his A1C is 8.1.      Last edited by Bernarda Caffey, MD on 02/07/2022 12:34 PM.    Pt states no change in [place].  Referring physician: Shirleen Schirmer, PA-C No referring provider defined for this encounter.  HISTORICAL INFORMATION:   Selected notes from the MEDICAL RECORD NUMBER Referred by Shirleen Schirmer, PA for eval of diabetic retinopathy OU; DME OS   CURRENT MEDICATIONS: Current Outpatient Medications (Ophthalmic Drugs)  Medication Sig   RESTASIS 0.05 % ophthalmic emulsion Place 1 drop into both eyes in the morning and at bedtime.   No current facility-administered medications for this visit. (Ophthalmic Drugs)   Current Outpatient Medications (Other)  Medication Sig   ACCU-CHEK AVIVA PLUS test strip TEST 3 TIMES DAILY   Accu-Chek Softclix Lancets lancets CHECK BLOOD SUGAR ONCE DAILY   acetaminophen (TYLENOL) 325 MG tablet Take 2 tablets (650 mg total) by mouth every 6 (six) hours as needed for mild pain (or Fever >/= 101).   aspirin 81 MG EC tablet Take 81 mg by mouth daily.   atorvastatin (LIPITOR) 80 MG tablet Take 1 tablet (80 mg total) by mouth every evening.   ciclopirox (PENLAC) 8 % solution Apply topically at bedtime. Apply over nail and surrounding skin. Apply daily over previous coat. After seven (7) days, may remove with  alcohol and continue cycle.   clobetasol cream (TEMOVATE) 5.36 % Apply 1 application topically 2 (two) times daily.   clopidogrel (PLAVIX) 75 MG tablet TAKE 1 TABLET BY MOUTH EVERY DAY   empagliflozin (JARDIANCE) 25 MG TABS tablet TAKE 1 TABLET(25 MG) BY MOUTH AT BEDTIME   gabapentin (NEURONTIN) 300 MG capsule TAKE 1 CAPSULE(300 MG) BY MOUTH AT BEDTIME   glipiZIDE (GLUCOTROL) 10 MG tablet TAKE 2 TABLETS(20 MG) BY MOUTH TWICE DAILY   JANUMET XR [phone] MG TB24 Take 1 tablet by mouth daily.   metoprolol succinate (TOPROL-XL) 100 MG 24 hr tablet Take 1 tablet (100 mg total) by mouth daily.   Misc. Devices MISC Cpap supplies-tubing mask Dx G47.33 and Z99.89   nitroGLYCERIN (NITROSTAT) 0.4 MG SL tablet PLACE 1 TABLET UNDER THE TONGUE EVERY 5 MINS AS NEEDED FOR CHEST PAIN. MAXIMUM OF 3 DOSES   nystatin cream (MYCOSTATIN) SMARTSIG:Sparingly Topical 2-3 Times Daily PRN   OZEMPIC, 1 MG/DOSE, 4 MG/3ML SOPN Inject 1 mg into the skin once a week.   sacubitril-valsartan (ENTRESTO) 24-26 MG Take 1 tablet by mouth 2 (two) times daily.   Semaglutide,0.25 or 0.5MG/DOS, (OZEMPIC, 0.25 OR 0.5 MG/DOSE,) 2 MG/1.5ML SOPN Inject 0.25 mg into the skin once a week.   sildenafil (VIAGRA) 100 MG tablet Take 100 mg by mouth daily as needed.   spironolactone (ALDACTONE) 25 MG tablet Take 0.5  tablets (12.5 mg total) by mouth every other day.   triamcinolone ointment (KENALOG) 0.1 % SMARTSIG:sparingly Topical Twice Daily   VENTOLIN HFA 108 (90 Base) MCG/ACT inhaler INHALE 2 PUFFS BY MOUTH EVERY 6 HOURS AS NEEDED FOR WHEEZING OR SHORTNESS OF BREATH   No current facility-administered medications for this visit. (Other)   REVIEW OF SYSTEMS: ROS   Positive for: Endocrine, Cardiovascular, Eyes, Respiratory Negative for: Constitutional, Gastrointestinal, Neurological, Skin, Genitourinary, Musculoskeletal, HENT, Psychiatric, Allergic/Imm, Heme/Lymph Last edited by Annie Paras, COT on 02/07/2022  9:03 AM.      ALLERGIES No Known Allergies  PAST MEDICAL HISTORY Past Medical History:  Diagnosis Date   5 years ago    AICD (automatic cardioverter/defibrillator) present    Anxiety    Arthritis    Cataract    CHF (congestive heart failure) ([name])    Coronary artery disease    Diabetes mellitus without complication ([place])    Diabetic retinopathy ([place])    Dysrhythmia    GERD (gastroesophageal reflux disease)    PMH   Hypertension    Hypertensive retinopathy    Peripheral vascular disease ([name])    Peripheral vascular disease ([place])    Pneumonia    Shoulder impingement, right    Sleep apnea    wears cpap   Stomach ulcer    Wears glasses    Past Surgical History:  Procedure Laterality Date   AMPUTATION Left 03/12/2019   Procedure: 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;  Surgeon: [name], MD;  Location: [place];  Service: Orthopedics;  Laterality: Left;   AMPUTATION Right 10/01/2020   Procedure: RIGHT 2nd TOE AMPUTATION;  Surgeon: Felipa Furnace, DPM;  Location: [place];  Service: Podiatry;  Laterality: Right;   CARDIAC CATHETERIZATION     CARDIAC DEFIBRILLATOR PLACEMENT     CIRCUMCISION N/A 11/05/2020   Procedure: CIRCUMCISION ADULT;  Surgeon: Irine Seal, MD;  Location: [facility];  Service: Urology;  Laterality: N/A;   I & D EXTREMITY Left 07/19/2016   Procedure: IRRIGATION AND DEBRIDEMENT EXTREMITY/OLECRANON(WASHOUT);  Surgeon: Leandrew Koyanagi, MD;  Location: [facility];  Service: Orthopedics;  Laterality: Left;   OLECRANON BURSECTOMY Left 07/19/2016   Procedure: LEFT ELBOW OLECRANON BURSECTOMY;  Surgeon: Leandrew Koyanagi, MD;  Location: [place];  Service: Orthopedics;  Laterality: Left;   SHOULDER ARTHROSCOPY Right 05/16/2019   Procedure: Right Shoulder Arthroscopy;  Surgeon: [name], MD;  Location: [place];  Service: Orthopedics;  Laterality: Right;   FAMILY HISTORY Family History  Problem Relation Age of Onset   Heart disease Mother    Diabetes Mother    Peripheral vascular disease  Mother    SOCIAL HISTORY Social History   Tobacco Use   Smoking status: Former    Packs/day: 1.00    Years: 40.00    Total pack years: 40.00    Types: Cigarettes    Quit date: 03/11/2019    Years since quitting: 2.9   Smokeless tobacco: Never  Vaping Use   Vaping Use: Never used  Substance Use Topics   Alcohol use: Not Currently   Drug use: Not Currently    Types: Oxycodone       OPHTHALMIC EXAM:  Base Eye Exam     Visual Acuity (Snellen - Linear)       Right Left   Dist cc 20/20 20/25    Correction: Glasses         Tonometry (Tonopen, 9:06 AM)       Right Left  Pressure 13 17         Pupils       Dark Light Shape React APD   Right 3 2 Round Brisk None   Left 3 2 Round Brisk None         Visual Fields       Left Right    Full Full         Extraocular Movement       Right Left    Full, Ortho Full, Ortho         Neuro/Psych     Oriented x3: Yes   Mood/Affect: Normal         Dilation     Both eyes: 2.5% Phenylephrine, 1.0% Mydriacyl @ 9:04 AM           Slit Lamp and Fundus Exam     Slit Lamp Exam       Right Left   Lids/Lashes Dermatochalasis - upper lid Dermatochalasis - upper lid   Conjunctiva/Sclera nasal pingeucula nasal pingeucula   Cornea arcus, tear film debris, 1+ PEE arcus, tear film debris, 1+PEE, old subepithelial scars inferior paracentral with haze   Anterior Chamber Deep and quiet Deep and quiet   Iris Round and dilated, No NVI Round and dilated, No NVI   Lens 2-3+ Nuclear sclerosis, 2-3+ Cortical cataract 2-3+ Nuclear sclerosis, 2-3+ Cortical cataract   Anterior Vitreous Vitreous syneresis Vitreous syneresis         Fundus Exam       Right Left   Disc Pink and Sharp, Compact Pink and Sharp, Compact   C/D Ratio 0.1 0.2   Macula Flat, Blunted foveal reflex, + MA / DBH greatest temporal macula, cystic changes-slightly increased, focal heme-inf mac. Flat, Blunted foveal reflex, +MA/DBH, trace cystic  changes nasal fovea -- improved, scattered cystic changes, prominent blot hem inferior to fovea--improved   Vessels mild attenuation, mild tortuosity attenuated, Tortuous   Periphery Attached; scattered DBH Attached; scattered DBH           Refraction     Wearing Rx       Sphere Cylinder Axis Add   Right +1.25 +1.00 137 +2.50   Left +1.00 +0.50 030 +2.50    Type: PAL           IMAGING AND PROCEDURES  Imaging and Procedures for 02/07/2022  OCT, Retina - OU - Both Eyes       Right Eye Quality was good. Central Foveal Thickness: 251. Progression has worsened. Findings include normal foveal contour, no SRF, intraretinal fluid (Mild interval increase IRF-infero temporal IT fovea and mac and supero nasal mac).   Left Eye Quality was good. Central Foveal Thickness: 234. Progression has improved. Findings include normal foveal contour, no IRF, no SRF, intraretinal hyper-reflective material (Interval improvement in central IRF/cystic changes. Trace cystic changes remain infero temporal mac.).   Notes *Images captured and stored on drive  Diagnosis / Impression:  OD: Mild interval increase IRF in inferotemporal fovea and mac, and superonasal mac OS: Interval improvement in central IRF/cystic changes. Trace cystic changes remain inferotemporal mac.  Clinical management:  See below  Abbreviations: NFP - Normal foveal profile. CME - cystoid macular edema. PED - pigment epithelial detachment. IRF - intraretinal fluid. SRF - subretinal fluid. EZ - ellipsoid zone. ERM - epiretinal membrane. ORA - outer retinal atrophy. ORT - outer retinal tubulation. SRHM - subretinal hyper-reflective material. IRHM - intraretinal hyper-reflective material      Intravitreal Injection, Pharmacologic Agent -  OS - Left Eye       Time Out 02/07/2022. 10:52 AM. Confirmed correct patient, procedure, site, and patient consented.   Anesthesia Topical anesthesia was used. Anesthetic medications  included Lidocaine 2%, Proparacaine 0.5%.   Procedure Preparation included 5% betadine to ocular surface, eyelid speculum. A (32g) needle was used.   Injection: 1.25 mg Bevacizumab 1.65m/0.05ml   Route: Intravitreal, Site: Left Eye   NDC:: 73532-992-42 Lot:: [id number] Expiration date: 02/26/2022   Post-op Post injection exam found visual acuity of at least counting fingers. The patient tolerated the procedure well. There were no complications. The patient received written and verbal post procedure care education. Post injection medications were not given.            ASSESSMENT/PLAN:    ICD-10-CM   1. Moderate nonproliferative diabetic retinopathy of both eyes with macular edema associated with type 2 diabetes mellitus ([name])  E11.3313 OCT, Retina - OU - Both Eyes    Intravitreal Injection, Pharmacologic Agent - OS - Left Eye    Bevacizumab (AVASTIN) SOLN 1.25 mg    2. Essential hypertension  I10     3. Hypertensive retinopathy of both eyes  H35.033     4. Combined forms of age-related cataract of both eyes  H25.813      1. Moderate nonproliferative diabetic retinopathy w/ DME OU - s/p IVA OS #1 (07.25.23), #2 (08.22.23), #3 (09.19.23), #4 (10.17.23)  - A1c 8.1 09/2021 per pt report, 8.8 on 02.15.23 in Care Everywhere - stressed to the patient getting the A1C under 7 could help improve vision. - pt working with Endodrinologist and blood glucose improving  - exam shows scattered MA OU - FA (06.21.22) shows late leaking MA, no NV OU - OCT OD: Mild interval increase IRF-infero temporal IT fovea and mac and supero nasal mac; OS: Interval improvement in central IRF/cystic changes. Trace cystic changes remain infero temporal mac at 6 wks - BCVA OD 20/20, OS 20/25 -- stable - recommend IVA OS #5 today 11.28.23 w/ ext f/u to 7 wks - RBA of procedure discussed, questions answered - Avastin informed consent obtained and signed 07.25.23 - see procedure note  - f/u ext to 7 weeks --  DFE/OCT, possible injection  2,3. Hypertensive retinopathy OU - discussed importance of tight BP control - continue to monitor  4. Mixed Cataract OU - The symptoms of cataract, surgical options, and treatments and risks were discussed with patient. - discussed diagnosis and progression - continue to monitor  Ophthalmic Meds Ordered this visit:  Meds ordered this encounter  Medications   Bevacizumab (AVASTIN) SOLN 1.25 mg     Return in about 7 weeks (around 03/28/2022) for DFE, OCT, possible injection.  There are no Patient Instructions on file for this visit.  Explained the diagnoses, plan, and follow up with the patient and they expressed understanding.  Patient expressed understanding of the importance of proper follow up care.   This document serves as a record of services personally performed by BGardiner Sleeper MD, PhD. It was created on their behalf by CRenaldo Reel CBroaddusan ophthalmic technician. The creation of this record is the provider's dictation and/or activities during the visit.    Electronically signed by:  CRenaldo Reel COT  11.14.23 12:38 PM  BGardiner Sleeper M.D., Ph.D. Diseases & Surgery of the Retina and Vitreous [facility] I have reviewed the above documentation for accuracy and completeness, and I agree with the above. BGardiner Sleeper  M.D., Ph.D. 02/07/22 12:38 PM   Abbreviations: M myopia (nearsighted); A astigmatism; H hyperopia (farsighted); P presbyopia; Mrx spectacle prescription;  CTL contact lenses; OD right eye; OS left eye; OU both eyes  XT exotropia; ET esotropia; PEK punctate epithelial keratitis; PEE punctate epithelial erosions; DES dry eye syndrome; MGD meibomian gland dysfunction; ATs artificial tears; PFAT's preservative free artificial tears; [place] nuclear sclerotic cataract; PSC posterior subcapsular cataract; ERM epi-retinal membrane; PVD posterior vitreous detachment; RD retinal detachment; DM diabetes  mellitus; DR diabetic retinopathy; NPDR non-proliferative diabetic retinopathy; PDR proliferative diabetic retinopathy; CSME clinically significant macular edema; DME diabetic macular edema; dbh dot blot hemorrhages; CWS cotton wool spot; POAG primary open angle glaucoma; C/D cup-to-disc ratio; HVF humphrey visual field; GVF goldmann visual field; OCT optical coherence tomography; IOP intraocular pressure; BRVO Branch retinal vein occlusion; CRVO central retinal vein occlusion; CRAO central retinal artery occlusion; BRAO branch retinal artery occlusion; RT retinal tear; SB scleral buckle; PPV pars plana vitrectomy; VH Vitreous hemorrhage; PRP panretinal laser photocoagulation; IVK intravitreal kenalog; VMT vitreomacular traction; MH Macular hole;  NVD neovascularization of the disc; NVE neovascularization elsewhere; AREDS age related eye disease study; ARMD age related macular degeneration; POAG primary open angle glaucoma; EBMD epithelial/anterior basement membrane dystrophy; ACIOL anterior chamber intraocular lens; IOL intraocular lens; PCIOL posterior chamber intraocular lens; Phaco/IOL phacoemulsification with intraocular lens placement; [place] photorefractive keratectomy; LASIK laser assisted in situ keratomileusis; HTN hypertension; DM diabetes mellitus; COPD chronic obstructive pulmonary disease

## 2022-02-03 ENCOUNTER — Other Ambulatory Visit: Payer: Self-pay | Admitting: Internal Medicine

## 2022-02-03 NOTE — Telephone Encounter (Signed)
Requested medication (s) are due for refill today - no  Requested medication (s) are on the active medication list -yes  Future visit scheduled -no  Last refill: 03/26/21 30g  Notes to clinic: Ointment Rx - no longer current- changed to cream which is current on the list  Requested Prescriptions  Pending Prescriptions Disp Refills   clobetasol ointment (TEMOVATE) 0.05 % [Pharmacy Med Name: CLOBETASOL PROP 0.05% OINT 30GM] 30 g 0    Sig: APPLY TOPICALLY TO THE AFFECTED AREA TWICE DAILY AS NEEDED     Not Delegated - Dermatology:  Corticosteroids Failed - 02/03/2022  3:18 AM      Failed - This refill cannot be delegated      Failed - Valid encounter within last 12 months    Recent Outpatient Visits           1 year ago Type 2 diabetes mellitus with peripheral neuropathy ([name])   [facility] Marcine Matar, MD   1 year ago Establishing care with new doctor, encounter for   [facility] Marcine Matar, MD       Future Appointments             In 2 weeks Adonis Huguenin, NP [facility]               Requested Prescriptions  Pending Prescriptions Disp Refills   clobetasol ointment (TEMOVATE) 0.05 % [Pharmacy Med Name: CLOBETASOL PROP 0.05% OINT 30GM] 30 g 0    Sig: APPLY TOPICALLY TO THE AFFECTED AREA TWICE DAILY AS NEEDED     Not Delegated - Dermatology:  Corticosteroids Failed - 02/03/2022  3:18 AM      Failed - This refill cannot be delegated      Failed - Valid encounter within last 12 months    Recent Outpatient Visits           1 year ago Type 2 diabetes mellitus with peripheral neuropathy [facility])   [facility] Marcine Matar, MD   1 year ago Establishing care with new doctor, encounter for   [facility], [facility]       Future Appointments             In 2 weeks Adonis Huguenin, NP [facility]  [place]

## 2022-02-06 ENCOUNTER — Encounter: Payer: Self-pay | Admitting: Podiatry

## 2022-02-06 ENCOUNTER — Ambulatory Visit (INDEPENDENT_AMBULATORY_CARE_PROVIDER_SITE_OTHER): Payer: Medicare Other | Admitting: Podiatry

## 2022-02-06 DIAGNOSIS — M79675 Pain in left toe(s): Secondary | ICD-10-CM | POA: Diagnosis not present

## 2022-02-06 DIAGNOSIS — I739 Peripheral vascular disease, unspecified: Secondary | ICD-10-CM

## 2022-02-06 DIAGNOSIS — M79674 Pain in right toe(s): Secondary | ICD-10-CM

## 2022-02-06 DIAGNOSIS — Z89421 Acquired absence of other right toe(s): Secondary | ICD-10-CM

## 2022-02-06 DIAGNOSIS — B351 Tinea unguium: Secondary | ICD-10-CM | POA: Diagnosis not present

## 2022-02-06 DIAGNOSIS — E1159 Type 2 diabetes mellitus with other circulatory complications: Secondary | ICD-10-CM | POA: Diagnosis not present

## 2022-02-06 NOTE — Progress Notes (Signed)
This patient returns to my office for at risk foot care.  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  This patient presents for at risk foot care today.  General Appearance  Alert, conversant and in no acute stress.  Vascular  Dorsalis pedis and posterior tibial  pulses are weakly  palpable  bilaterally.  Capillary return is within normal limits  bilaterally. Temperature is within normal limits  bilaterally.  Neurologic  Senn-Weinstein monofilament wire test diminished  bilaterally. Muscle power within normal limits bilaterally.  Nails Thick disfigured discolored nails with subungual debris  from  1-4  toes left. and 1,3-5 toes right. . No evidence of bacterial infection or drainage bilaterally.  Orthopedic  No limitations of motion  feet .  No crepitus or effusions noted.  No bony pathology or digital deformities noted. Hallux valgus.  Hammer toes  B/L.  Skin  normotropic skin with no porokeratosis noted bilaterally.  No signs of infections or ulcers noted.     Onychomycosis  Pain in right toes  Pain in left toes  Consent was obtained for treatment procedures.   Mechanical debridement of nails 1-4 left and 1,3-5 right performed with a nail nipper.  Filed with dremel without incident. Patient qualifies for diabetic shoes due to DPN and HAV  B/L.   Return office visit   3 months                   Told patient to return for periodic foot care and evaluation due to potential at risk complications.   Marco Raper DPM   

## 2022-02-07 ENCOUNTER — Encounter (INDEPENDENT_AMBULATORY_CARE_PROVIDER_SITE_OTHER): Payer: Self-pay | Admitting: Ophthalmology

## 2022-02-07 ENCOUNTER — Ambulatory Visit (INDEPENDENT_AMBULATORY_CARE_PROVIDER_SITE_OTHER): Payer: Medicare Other | Admitting: Ophthalmology

## 2022-02-07 DIAGNOSIS — E113313 Type 2 diabetes mellitus with moderate nonproliferative diabetic retinopathy with macular edema, bilateral: Secondary | ICD-10-CM

## 2022-02-07 DIAGNOSIS — H25813 Combined forms of age-related cataract, bilateral: Secondary | ICD-10-CM | POA: Diagnosis not present

## 2022-02-07 DIAGNOSIS — I10 Essential (primary) hypertension: Secondary | ICD-10-CM | POA: Diagnosis not present

## 2022-02-07 DIAGNOSIS — H35033 Hypertensive retinopathy, bilateral: Secondary | ICD-10-CM | POA: Diagnosis not present

## 2022-02-07 MED ORDER — BEVACIZUMAB CHEMO INJECTION 1.25MG/0.05ML SYRINGE FOR KALEIDOSCOPE
1.2500 mg | INTRAVITREAL | Status: AC | PRN
  Administered 2022-02-07: 1.25 mg via INTRAVITREAL

## 2022-02-10 ENCOUNTER — Telehealth: Payer: Self-pay | Admitting: Physical Medicine and Rehabilitation

## 2022-02-10 NOTE — Telephone Encounter (Signed)
Pt called requesting a call back to set an appt for back injection. Please call pt at [phone].

## 2022-02-13 ENCOUNTER — Other Ambulatory Visit: Payer: Self-pay | Admitting: Physical Medicine and Rehabilitation

## 2022-02-13 DIAGNOSIS — M5416 Radiculopathy, lumbar region: Secondary | ICD-10-CM

## 2022-02-13 DIAGNOSIS — M48062 Spinal stenosis, lumbar region with neurogenic claudication: Secondary | ICD-10-CM

## 2022-02-13 NOTE — Telephone Encounter (Signed)
Patient is calling stating he is having the same type of pain he was having before. He stated his last injection lasted 6-8 months. No new falls, injuries and accidents. Please advise

## 2022-02-20 ENCOUNTER — Ambulatory Visit: Payer: Self-pay | Admitting: Nurse Practitioner

## 2022-02-21 ENCOUNTER — Ambulatory Visit (INDEPENDENT_AMBULATORY_CARE_PROVIDER_SITE_OTHER): Payer: Medicare Other | Admitting: Family

## 2022-02-21 DIAGNOSIS — G8929 Other chronic pain: Secondary | ICD-10-CM | POA: Diagnosis not present

## 2022-02-21 DIAGNOSIS — M25511 Pain in right shoulder: Secondary | ICD-10-CM

## 2022-02-21 DIAGNOSIS — M1711 Unilateral primary osteoarthritis, right knee: Secondary | ICD-10-CM

## 2022-02-21 DIAGNOSIS — M1712 Unilateral primary osteoarthritis, left knee: Secondary | ICD-10-CM

## 2022-02-21 DIAGNOSIS — M170 Bilateral primary osteoarthritis of knee: Secondary | ICD-10-CM | POA: Diagnosis not present

## 2022-02-21 NOTE — Progress Notes (Unsigned)
Office Visit Note   [name]           Date of Birth: 17-Aug-1955           MRN: [id number] Visit Date: 02/21/2022              Requested by: No referring provider defined for this encounter. PCP: Pcp, No  Chief Complaint  Patient presents with   Right Shoulder - Follow-up   Left Knee - Follow-up      HPI: The patient is a 66 year old gentleman who comes in today complaining of return of his chronic bilateral knee pain as well as his impingement symptoms of the right shoulder.  He has been having difficulty doing his activities of daily living as well as dancing due to the pain.  He is requesting Depo-Medrol injection of all 3 joints today.  He has previously had adequate relief with Depo-Medrol injections of these joints.  Assessment & Plan: Visit Diagnoses: No diagnosis found.  Plan: Depo-Medrol injection right shoulder bilateral knees.  Patient tolerated well.  Follow-Up Instructions: No follow-ups on file.   Ortho Exam  Patient is alert, oriented, no adenopathy, well-dressed, normal affect, normal respiratory effort. On examination of the right shoulder patient has abduction and flexion about 90 degrees.  Crepitation with range of motion.  He has good internal and external rotation with passive range of motion to 120 degrees.  No adhesive capsulitis.  Bilateral knees without edema or erythema there is medial joint line tenderness.  Collaterals and cruciates are stable.  Imaging: No results found. No images are attached to the encounter.  Labs: Lab Results  Component Value Date   HGBA1C 8.7 (H) 10/19/2020   HGBA1C 9.8 (H) 09/08/2020   HGBA1C 11.0 (A) 07/23/2020   ESRSEDRATE 11 03/11/2019   CRP 1.8 (H) 03/11/2019   REPTSTATUS 10/04/2020 FINAL 09/29/2020   GRAMSTAIN  07/19/2016    RARE WBC PRESENT, PREDOMINANTLY MONONUCLEAR NO ORGANISMS SEEN    CULT  09/29/2020    NO GROWTH 5 DAYS Performed at [facility], [address], [place], [place]  [address]      Lab Results  Component Value Date   ALBUMIN 3.5 09/29/2020   ALBUMIN 4.2 04/26/2020   ALBUMIN 3.7 03/11/2019    No results found for: "MG" No results found for: "VD25OH"  No results found for: "PREALBUMIN"    Latest Ref Rng & Units 10/19/2020   10:59 AM 10/01/2020    5:08 AM 09/30/2020    4:56 AM  CBC EXTENDED  WBC 4.0 - 10.5 K/uL 8.0  7.4  8.4   RBC 4.22 - 5.81 MIL/uL 4.45  4.32  3.99   Hemoglobin 13.0 - 17.0 g/dL 54.0  08.6  76.1   HCT 39.0 - 52.0 % 42.3  40.1  36.6   Platelets 150 - 400 K/uL 183  202  170      There is no height or weight on file to calculate BMI.  Orders:  No orders of the defined types were placed in this encounter.  No orders of the defined types were placed in this encounter.    Procedures: Large Joint Inj: R subacromial bursa on 02/21/2022 11:05 AM Indications: pain Details: 22 G 1.5 in needle Medications: 5 mL lidocaine 1 %; 40 mg methylPREDNISolone acetate 40 MG/ML Consent was given by the patient.    Large Joint Inj: bilateral knee on 02/21/2022 11:05 AM Indications: pain Details: 18 G 1.5 in needle, anteromedial approach Medications (  Right): 5 mL lidocaine 1 %; 40 mg methylPREDNISolone acetate 40 MG/ML Medications (Left): 5 mL lidocaine 1 %; 40 mg methylPREDNISolone acetate 40 MG/ML Consent was given by the patient.      Clinical Data: No additional findings.  ROS:  All other systems negative, except as noted in the HPI. Review of Systems  Objective: Vital Signs: There were no vitals taken for this visit.  Specialty Comments:  No specialty comments available.  PMFS History: Patient Active Problem List   Diagnosis Date Noted   Diabetic neuropathy ([name]) 10/31/2021   Pain due to onychomycosis of toenails of both feet 01/21/2021   Phimosis of penis 10/06/2020   Cellulitis 09/29/2020   Cellulitis of right toe 09/29/2020   Chronic combined systolic and diastolic heart failure ([name]) 09/15/2020   Onychomycosis  of toenail 08/24/2020   Diabetes mellitus ([name]) 07/23/2020   Type 2 diabetes mellitus with hyperglycemia, without long-term current use of insulin ([name]) 07/23/2020   Weight gain 07/23/2020   Type 2 diabetes mellitus with peripheral neuropathy ([name]) 07/23/2020   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 diabetes mellitus ([name]) 05/04/2020   Hypertensive heart disease with heart failure ([name]) 04/26/2020   Type 2 diabetes mellitus with diabetic peripheral angiopathy without gangrene, without long-term current use of insulin ([name]) 04/26/2020   OSA on CPAP 04/26/2020   PAD (peripheral artery disease) ([name]) 04/26/2020   CAD in native artery 04/26/2020   Class 2 severe obesity due to excess calories with serious comorbidity and body mass index (BMI) of 38.0 to 38.9 in adult [facility]) 04/26/2020   Impingement syndrome of right shoulder    Nontraumatic complete tear of right rotator cuff    Type 2 diabetes mellitus with foot ulcer, without long-term current use of insulin ([name])    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   S/P debridement 07/19/2016   Acute gout of left elbow 07/19/2016   Past Medical History:  Diagnosis Date   5 years ago    AICD (automatic cardioverter/defibrillator) present    Anxiety    Arthritis    Cataract    CHF (congestive heart failure) ([name])    Coronary artery disease    Diabetes mellitus without complication ([name])    Diabetic retinopathy ([name])    Dysrhythmia    GERD (gastroesophageal reflux disease)    PMH   Hypertension    Hypertensive retinopathy    Peripheral vascular disease ([name])    Peripheral vascular disease ([name])    Pneumonia    Shoulder impingement, right    Sleep apnea    wears cpap   Stomach ulcer    Wears glasses     Family History  Problem Relation Age of Onset   Heart disease Mother    Diabetes Mother    Peripheral vascular disease Mother     Past Surgical History:  Procedure Laterality Date   AMPUTATION Left 03/12/2019    Procedure: 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;  Surgeon: [name], MD;  Location: [facility] OR;  Service: Orthopedics;  Laterality: Left;   AMPUTATION Right 10/01/2020   Procedure: RIGHT 2nd TOE AMPUTATION;  Surgeon: [name], DPM;  Location: [facility] OR;  Service: Podiatry;  Laterality: Right;   CARDIAC CATHETERIZATION     CARDIAC DEFIBRILLATOR PLACEMENT     CIRCUMCISION N/A 11/05/2020   Procedure: CIRCUMCISION ADULT;  Surgeon: [name], MD;  Location: [facility] ORS;  Service: Urology;  Laterality: N/A;   I & D EXTREMITY Left 07/19/2016   Procedure: IRRIGATION  AND DEBRIDEMENT EXTREMITY/OLECRANON(WASHOUT);  Surgeon: Tarry Kos, MD;  Location: [facility] OR;  Service: Orthopedics;  Laterality: Left;   OLECRANON BURSECTOMY Left 07/19/2016   Procedure: LEFT ELBOW OLECRANON BURSECTOMY;  Surgeon: Tarry Kos, MD;  Location: [facility];  Service: Orthopedics;  Laterality: Left;   SHOULDER ARTHROSCOPY Right 05/16/2019   Procedure: Right Shoulder Arthroscopy;  Surgeon: [name], MD;  Location: [facility] OR;  Service: Orthopedics;  Laterality: Right;   Social History   Occupational History   Not on file  Tobacco Use   Smoking status: Former    Packs/day: 1.00    Years: 40.00    Total pack years: 40.00    Types: Cigarettes    Quit date: 03/11/2019    Years since quitting: 2.9   Smokeless tobacco: Never  Vaping Use   Vaping Use: Never used  Substance and Sexual Activity   Alcohol use: Not Currently   Drug use: Not Currently    Types: Oxycodone   Sexual activity: Not on file

## 2022-02-22 ENCOUNTER — Encounter: Payer: Self-pay | Admitting: Family

## 2022-02-22 ENCOUNTER — Ambulatory Visit: Payer: Medicare Other | Admitting: Family

## 2022-02-22 MED ORDER — METHYLPREDNISOLONE ACETATE 40 MG/ML IJ SUSP
40.0000 mg | INTRAMUSCULAR | Status: AC | PRN
  Administered 2022-02-21: 40 mg via INTRA_ARTICULAR

## 2022-02-22 MED ORDER — LIDOCAINE HCL 1 % IJ SOLN
5.0000 mL | INTRAMUSCULAR | Status: AC | PRN
  Administered 2022-02-21: 5 mL

## 2022-02-23 ENCOUNTER — Ambulatory Visit: Payer: Self-pay

## 2022-02-23 ENCOUNTER — Ambulatory Visit (INDEPENDENT_AMBULATORY_CARE_PROVIDER_SITE_OTHER): Payer: Medicare Other | Admitting: Physical Medicine and Rehabilitation

## 2022-02-23 VITALS — BP 126/78 | HR 80

## 2022-02-23 DIAGNOSIS — M48062 Spinal stenosis, lumbar region with neurogenic claudication: Secondary | ICD-10-CM

## 2022-02-23 DIAGNOSIS — M5416 Radiculopathy, lumbar region: Secondary | ICD-10-CM

## 2022-02-23 MED ORDER — METHYLPREDNISOLONE ACETATE 80 MG/ML IJ SUSP
40.0000 mg | Freq: Once | INTRAMUSCULAR | Status: AC
  Administered 2022-02-23: 40 mg

## 2022-02-23 NOTE — Patient Instructions (Signed)

## 2022-02-23 NOTE — Progress Notes (Signed)
  Numeric Pain Rating Scale and Functional Assessment Average Pain 7   In the last MONTH (on 0-10 scale) has pain interfered with the following?  1. General activity like being  able to carry out your everyday physical activities such as walking, climbing stairs, carrying groceries, or moving a chair?  Rating(7)   +Driver, -BT, -Dye Allergies.  Lower back pain on both sides with radiation down both legs to ankle. Hard to walk and stand for long periods of time. When walking, he feels tightness in calf

## 2022-03-02 NOTE — Progress Notes (Signed)
Jon Jones - 66 y.o. male MRN [id number]  Date of birth: Nov 12, 1955  Office Visit Note: Visit Date: 02/23/2022 PCP: Pcp, No Referred by: Lorine Bears, NP  Subjective: Chief Complaint  Patient presents with   Lower Back - Pain   HPI:  Jon Jones is a 66 y.o. male who comes in today for planned repeat Right L4-5 and S1 Lumbar Transforaminal epidural steroid injection with fluoroscopic guidance.  The patient has failed conservative care including home exercise, medications, time and activity modification.  This injection will be diagnostic and hopefully therapeutic.  Please see requesting physician notes for further details and justification. Patient received more than 50% pain relief from prior injection.   Referring: Dr. Basil Dess and Dondra Prader, FNP   ROS Otherwise per HPI.  Assessment & Plan: Visit Diagnoses:    ICD-10-CM   1. Lumbar radiculopathy  M54.16 XR C-ARM NO REPORT    Epidural Steroid injection    methylPREDNISolone acetate (DEPO-MEDROL) injection 40 mg    2. Spinal stenosis of lumbar region with neurogenic claudication  M48.062       Plan: No additional findings.   Meds & Orders:  Meds ordered this encounter  Medications   methylPREDNISolone acetate (DEPO-MEDROL) injection 40 mg    Orders Placed This Encounter  Procedures   XR C-ARM NO REPORT   Epidural Steroid injection    Follow-up: Return if symptoms worsen or fail to improve.   Procedures: No procedures performed  Lumbosacral Transforaminal Epidural Steroid Injection - Sub-Pedicular Approach with Fluoroscopic Guidance  [name]      Date of Birth: [birth year] MRN: [id number] PCP: Pcp, No      Visit Date: 02/23/2022   Universal Protocol:    Date/Time: 02/23/2022  Consent Given By: the patient  Position: PRONE  Additional Comments: Vital signs were monitored before and after the procedure. Patient was prepped and draped in the usual sterile fashion. The correct patient,  procedure, and site was verified.   Injection Procedure Details:   Procedure diagnoses: Lumbar radiculopathy [M54.16]    Meds Administered:  Meds ordered this encounter  Medications   methylPREDNISolone acetate (DEPO-MEDROL) injection 40 mg    Laterality: Right  Location/Site: L4 and S1  Needle:5.0 in., 22 ga.  Short bevel or Quincke spinal needle  Needle Placement: Transforaminal  Findings:    -Comments: Excellent flow of contrast along the nerve, nerve root and into the epidural space.  Procedure Details: After squaring off the end-plates to get a true AP view, the C-arm was positioned so that an oblique view of the foramen as noted above was visualized. The target area is just inferior to the "nose of the scotty dog" or sub pedicular. The soft tissues overlying this structure were infiltrated with 2-3 ml. of 1% Lidocaine without Epinephrine.  The spinal needle was inserted toward the target using a "trajectory" view along the fluoroscope beam.  Under AP and lateral visualization, the needle was advanced so it did not puncture dura and was located close the 6 O'Clock position of the pedical in AP tracterory. Biplanar projections were used to confirm position. Aspiration was confirmed to be negative for CSF and/or blood. A 1-2 ml. volume of Isovue-250 was injected and flow of contrast was noted at each level. Radiographs were obtained for documentation purposes.   After attaining the desired flow of contrast documented above, a 0.5 to 1.0 ml test dose of 0.25% Marcaine was injected into each respective transforaminal space.  The patient was  observed for 90 seconds post injection.  After no sensory deficits were reported, and normal lower extremity motor function was noted,   the above injectate was administered so that equal amounts of the injectate were placed at each foramen (level) into the transforaminal epidural space.   Additional Comments:  No complications  occurred Dressing: 2 x 2 sterile gauze and Band-Aid    Post-procedure details: Patient was observed during the procedure. Post-procedure instructions were reviewed.  Patient left the clinic in stable condition.    Clinical History: Ct Myelogram  IMPRESSION:  1. L5-S1: Severe multifactorial spinal stenosis. Advanced bilateral  facet arthropathy with 5 mm of anterolisthesis which increases to 8  mm with standing and flexion. Bulging of the disc. 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 Marked constriction of the thecal sac  due to these processes in combination with abundant epidural fat.  Severe bilateral foraminal stenosis could compress either or both  exiting L5 nerves.  2. L4-5: Moderate multifactorial spinal stenosis.  3. T12-L1: Right foraminal encroachment by osteophyte could possibly  affect the right T12 nerve.    Aortic Atherosclerosis (ICD10-I70.0).      Electronically Signed    By: Paulina Fusi M.D.    On: 06/29/2020 12:39     Objective:  VS:  HT:    WT:   BMI:     BP:126/78  HR:80bpm  TEMP: ( )  RESP:  Physical Exam Vitals and nursing note reviewed.  Constitutional:      General: He is not in acute distress.    Appearance: Normal appearance. He is not ill-appearing.  HENT:     Head: Normocephalic and atraumatic.     Right Ear: External ear normal.     Left Ear: External ear normal.     Nose: No congestion.  Eyes:     Extraocular Movements: Extraocular movements intact.  Cardiovascular:     Rate and Rhythm: Normal rate.     Pulses: Normal pulses.  Pulmonary:     Effort: Pulmonary effort is normal. No respiratory distress.  Abdominal:     General: There is no distension.     Palpations: Abdomen is soft.  Musculoskeletal:        General: No tenderness or signs of injury.     Cervical back: Neck supple.     Right lower leg: No edema.     Left lower leg: No edema.      Comments: Patient has good distal strength without clonus.  Skin:    Findings: No erythema or rash.  Neurological:     General: No focal deficit present.     Mental Status: He is alert and oriented to person, place, and time.     Sensory: No sensory deficit.     Motor: No weakness or abnormal muscle tone.     Coordination: Coordination normal.  Psychiatric:        Mood and Affect: Mood normal.        Behavior: Behavior normal.      Imaging: No results found.

## 2022-03-02 NOTE — Procedures (Signed)
Lumbosacral Transforaminal Epidural Steroid Injection - Sub-Pedicular Approach with Fluoroscopic Guidance  [name]      Date of Birth: Sep 14, 1955 MRN: [id number] PCP: Pcp, No      Visit Date: 02/23/2022   Universal Protocol:    Date/Time: 02/23/2022  Consent Given By: the patient  Position: PRONE  Additional Comments: Vital signs were monitored before and after the procedure. Patient was prepped and draped in the usual sterile fashion. The correct patient, procedure, and site was verified.   Injection Procedure Details:   Procedure diagnoses: Lumbar radiculopathy [M54.16]    Meds Administered:  Meds ordered this encounter  Medications   methylPREDNISolone acetate (DEPO-MEDROL) injection 40 mg    Laterality: Right  Location/Site: L4 and S1  Needle:5.0 in., 22 ga.  Short bevel or Quincke spinal needle  Needle Placement: Transforaminal  Findings:    -Comments: Excellent flow of contrast along the nerve, nerve root and into the epidural space.  Procedure Details: After squaring off the end-plates to get a true AP view, the C-arm was positioned so that an oblique view of the foramen as noted above was visualized. The target area is just inferior to the "nose of the scotty dog" or sub pedicular. The soft tissues overlying this structure were infiltrated with 2-3 ml. of 1% Lidocaine without Epinephrine.  The spinal needle was inserted toward the target using a "trajectory" view along the fluoroscope beam.  Under AP and lateral visualization, the needle was advanced so it did not puncture dura and was located close the 6 O'Clock position of the pedical in AP tracterory. Biplanar projections were used to confirm position. Aspiration was confirmed to be negative for CSF and/or blood. A 1-2 ml. volume of Isovue-250 was injected and flow of contrast was noted at each level. Radiographs were obtained for documentation purposes.   After attaining the desired flow of  contrast documented above, a 0.5 to 1.0 ml test dose of 0.25% Marcaine was injected into each respective transforaminal space.  The patient was observed for 90 seconds post injection.  After no sensory deficits were reported, and normal lower extremity motor function was noted,   the above injectate was administered so that equal amounts of the injectate were placed at each foramen (level) into the transforaminal epidural space.   Additional Comments:  No complications occurred Dressing: 2 x 2 sterile gauze and Band-Aid    Post-procedure details: Patient was observed during the procedure. Post-procedure instructions were reviewed.  Patient left the clinic in stable condition.

## 2022-03-07 ENCOUNTER — Ambulatory Visit (INDEPENDENT_AMBULATORY_CARE_PROVIDER_SITE_OTHER): Payer: Medicare Other

## 2022-03-07 DIAGNOSIS — I5042 Chronic combined systolic (congestive) and diastolic (congestive) heart failure: Secondary | ICD-10-CM

## 2022-03-08 LAB — CUP PACEART REMOTE DEVICE CHECK
Battery Remaining Longevity: 55 mo
Battery Voltage: 2.97 V
Brady Statistic AP VP Percent: 0 %
Brady Statistic AP VS Percent: 0.53 %
Brady Statistic AS VP Percent: 0.03 %
Brady Statistic AS VS Percent: 99.44 %
Brady Statistic RA Percent Paced: 0.54 %
Brady Statistic RV Percent Paced: 0.03 %
Date Time Interrogation Session: 20231226074227
HighPow Impedance: 81 Ohm
Implantable Lead Connection Status: 753985
Implantable Lead Connection Status: 753985
Implantable Lead Implant Date: 20180702
Implantable Lead Implant Date: 20180702
Implantable Lead Location: 753859
Implantable Lead Location: 753860
Implantable Lead Model: 5076
Implantable Pulse Generator Implant Date: 20180702
Lead Channel Impedance Value: 399 Ohm
Lead Channel Impedance Value: 456 Ohm
Lead Channel Impedance Value: 532 Ohm
Lead Channel Pacing Threshold Amplitude: 0.5 V
Lead Channel Pacing Threshold Amplitude: 0.5 V
Lead Channel Pacing Threshold Pulse Width: 0.4 ms
Lead Channel Pacing Threshold Pulse Width: 0.4 ms
Lead Channel Sensing Intrinsic Amplitude: 3.5 mV
Lead Channel Sensing Intrinsic Amplitude: 3.5 mV
Lead Channel Sensing Intrinsic Amplitude: 6.5 mV
Lead Channel Sensing Intrinsic Amplitude: 6.5 mV
Lead Channel Setting Pacing Amplitude: 1.5 V
Lead Channel Setting Pacing Amplitude: 2 V
Lead Channel Setting Pacing Pulse Width: 0.4 ms
Lead Channel Setting Sensing Sensitivity: 0.3 mV
Zone Setting Status: 755011
Zone Setting Status: 755011

## 2022-03-14 NOTE — Progress Notes (Signed)
[facility] Note  03/28/2022     CHIEF COMPLAINT Patient presents for Retina Follow Up  HISTORY OF PRESENT ILLNESS: Jon Jones is a 67 y.o. male who presents to the clinic today for:   HPI     Retina Follow Up   Patient presents with  Diabetic Retinopathy.  In both eyes.  This started 7 weeks ago.  I, the attending physician,  performed the HPI with the patient and updated documentation appropriately.        Comments   Patient here for 7 weeks retina follow up for NPDR OU. Patient states vision about the same. No eye pain. Seeing double. Using gtts QD OU. Suppose to be BID OU.      Last edited by Bernarda Caffey, MD on 03/28/2022  9:59 PM.    Pt states vision is stable, he is trying to change his behavior with food and keep his blood sugar in check  Referring physician: Shirleen Schirmer, PA-C No referring provider defined for this encounter.  HISTORICAL INFORMATION:   Selected notes from the MEDICAL RECORD NUMBER Referred by Shirleen Schirmer, PA for eval of diabetic retinopathy OU; DME OS   CURRENT MEDICATIONS: Current Outpatient Medications (Ophthalmic Drugs)  Medication Sig   RESTASIS 0.05 % ophthalmic emulsion Place 1 drop into both eyes in the morning and at bedtime.   No current facility-administered medications for this visit. (Ophthalmic Drugs)   Current Outpatient Medications (Other)  Medication Sig   ACCU-CHEK AVIVA PLUS test strip TEST 3 TIMES DAILY   Accu-Chek Softclix Lancets lancets CHECK BLOOD SUGAR ONCE DAILY   acetaminophen (TYLENOL) 325 MG tablet Take 2 tablets (650 mg total) by mouth every 6 (six) hours as needed for mild pain (or Fever >/= 101).   aspirin 81 MG EC tablet Take 81 mg by mouth daily.   atorvastatin (LIPITOR) 80 MG tablet Take 1 tablet (80 mg total) by mouth every evening.   ciclopirox (PENLAC) 8 % solution Apply topically at bedtime. Apply over nail and surrounding skin. Apply daily over previous coat.  After seven (7) days, may remove with alcohol and continue cycle.   clobetasol cream (TEMOVATE) 3.50 % Apply 1 application topically 2 (two) times daily.   clopidogrel (PLAVIX) 75 MG tablet TAKE 1 TABLET BY MOUTH EVERY DAY   empagliflozin (JARDIANCE) 25 MG TABS tablet TAKE 1 TABLET(25 MG) BY MOUTH AT BEDTIME   gabapentin (NEURONTIN) 300 MG capsule TAKE 1 CAPSULE(300 MG) BY MOUTH AT BEDTIME   glipiZIDE (GLUCOTROL) 10 MG tablet TAKE 2 TABLETS(20 MG) BY MOUTH TWICE DAILY   JANUMET XR [phone] MG TB24 Take 1 tablet by mouth daily.   metoprolol succinate (TOPROL-XL) 100 MG 24 hr tablet Take 1 tablet (100 mg total) by mouth daily.   Misc. Devices MISC Cpap supplies-tubing mask Dx G47.33 and Z99.89   nitroGLYCERIN (NITROSTAT) 0.4 MG SL tablet PLACE 1 TABLET UNDER THE TONGUE EVERY 5 MINS AS NEEDED FOR CHEST PAIN. MAXIMUM OF 3 DOSES   nystatin cream (MYCOSTATIN) SMARTSIG:Sparingly Topical 2-3 Times Daily PRN   OZEMPIC, 1 MG/DOSE, 4 MG/3ML SOPN Inject 1 mg into the skin once a week.   sacubitril-valsartan (ENTRESTO) 24-26 MG Take 1 tablet by mouth 2 (two) times daily.   Semaglutide,0.25 or 0.5MG/DOS, (OZEMPIC, 0.25 OR 0.5 MG/DOSE,) 2 MG/1.5ML SOPN Inject 0.25 mg into the skin once a week.   sildenafil (VIAGRA) 100 MG tablet Take 100 mg by mouth daily as needed.   spironolactone (ALDACTONE) 25  MG tablet Take 0.5 tablets (12.5 mg total) by mouth every other day.   triamcinolone ointment (KENALOG) 0.1 % SMARTSIG:sparingly Topical Twice Daily   VENTOLIN HFA 108 (90 Base) MCG/ACT inhaler INHALE 2 PUFFS BY MOUTH EVERY 6 HOURS AS NEEDED FOR WHEEZING OR SHORTNESS OF BREATH   No current facility-administered medications for this visit. (Other)   REVIEW OF SYSTEMS: ROS   Positive for: Endocrine, Cardiovascular, Eyes, Respiratory Negative for: Constitutional, Gastrointestinal, Neurological, Skin, Genitourinary, Musculoskeletal, HENT, Psychiatric, Allergic/Imm, Heme/Lymph Last edited by Theodore Demark, COA on  03/28/2022  9:05 AM.     ALLERGIES No Known Allergies  PAST MEDICAL HISTORY Past Medical History:  Diagnosis Date   5 years ago    AICD (automatic cardioverter/defibrillator) present    Anxiety    Arthritis    Cataract    CHF (congestive heart failure) ([name])    Coronary artery disease    Diabetes mellitus without complication ([name])    Diabetic retinopathy ([place])    Dysrhythmia    GERD (gastroesophageal reflux disease)    PMH   Hypertension    Hypertensive retinopathy    Peripheral vascular disease ([name])    Peripheral vascular disease ([place])    Pneumonia    Shoulder impingement, right    Sleep apnea    wears cpap   Stomach ulcer    Wears glasses    Past Surgical History:  Procedure Laterality Date   AMPUTATION Left 03/12/2019   Procedure: 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;  Surgeon: [name], MD;  Location: [place];  Service: Orthopedics;  Laterality: Left;   AMPUTATION Right 10/01/2020   Procedure: RIGHT 2nd TOE AMPUTATION;  Surgeon: Felipa Furnace, DPM;  Location: [place];  Service: Podiatry;  Laterality: Right;   CARDIAC CATHETERIZATION     CARDIAC DEFIBRILLATOR PLACEMENT     CIRCUMCISION N/A 11/05/2020   Procedure: CIRCUMCISION ADULT;  Surgeon: Irine Seal, MD;  Location: [facility];  Service: Urology;  Laterality: N/A;   I & D EXTREMITY Left 07/19/2016   Procedure: IRRIGATION AND DEBRIDEMENT EXTREMITY/OLECRANON(WASHOUT);  Surgeon: Leandrew Koyanagi, MD;  Location: [place];  Service: Orthopedics;  Laterality: Left;   OLECRANON BURSECTOMY Left 07/19/2016   Procedure: LEFT ELBOW OLECRANON BURSECTOMY;  Surgeon: Leandrew Koyanagi, MD;  Location: [place];  Service: Orthopedics;  Laterality: Left;   SHOULDER ARTHROSCOPY Right 05/16/2019   Procedure: Right Shoulder Arthroscopy;  Surgeon: [name], MD;  Location: [place];  Service: Orthopedics;  Laterality: Right;   FAMILY HISTORY Family History  Problem Relation Age of Onset   Heart disease Mother    Diabetes Mother     Peripheral vascular disease Mother    SOCIAL HISTORY Social History   Tobacco Use   Smoking status: Former    Packs/day: 1.00    Years: 40.00    Total pack years: 40.00    Types: Cigarettes    Quit date: 03/11/2019    Years since quitting: 3.0   Smokeless tobacco: Never  Vaping Use   Vaping Use: Never used  Substance Use Topics   Alcohol use: Not Currently   Drug use: Not Currently    Types: Oxycodone       OPHTHALMIC EXAM:  Base Eye Exam     Visual Acuity (Snellen - Linear)       Right Left   Dist cc 20/20 -1 20/25 -1    Correction: Glasses         Tonometry (Tonopen, 9:02 AM)  Right Left   Pressure 13 15         Pupils       Dark Light Shape React APD   Right 3 2 Round Brisk None   Left 3 2 Round Brisk None         Visual Fields (Counting fingers)       Left Right    Full Full         Extraocular Movement       Right Left    Full, Ortho Full, Ortho         Neuro/Psych     Oriented x3: Yes   Mood/Affect: Normal         Dilation     Both eyes: 1.0% Mydriacyl, 2.5% Phenylephrine @ 9:02 AM           Slit Lamp and Fundus Exam     Slit Lamp Exam       Right Left   Lids/Lashes Dermatochalasis - upper lid Dermatochalasis - upper lid   Conjunctiva/Sclera nasal pingeucula nasal pingeucula   Cornea arcus, tear film debris, 1+ PEE arcus, tear film debris, 1+PEE, old subepithelial scars inferior paracentral with haze   Anterior Chamber Deep and quiet Deep and quiet   Iris Round and dilated, No NVI Round and dilated, No NVI   Lens 2-3+ Nuclear sclerosis, 2-3+ Cortical cataract 2-3+ Nuclear sclerosis, 2-3+ Cortical cataract   Anterior Vitreous Vitreous syneresis Vitreous syneresis         Fundus Exam       Right Left   Disc Pink and Sharp, Compact Pink and Sharp, Compact   C/D Ratio 0.1 0.2   Macula Flat, good foveal reflex, +MA / DBH greatest temporal macula, cystic changes -- slightly improved, focal heme inferior  macula -- improved Flat, good foveal reflex, +MA/DBH, scattered cystic changes -- improved, prominent blot hem inferior to fovea -- stably improved   Vessels mild attenuation, mild tortuosity attenuated, Tortuous   Periphery Attached; scattered DBH Attached; scattered DBH           Refraction     Wearing Rx       Sphere Cylinder Axis Add   Right +1.25 +1.00 137 +2.50   Left +1.00 +0.50 030 +2.50    Type: PAL           IMAGING AND PROCEDURES  Imaging and Procedures for 03/28/2022  OCT, Retina - OU - Both Eyes       Right Eye Quality was good. Central Foveal Thickness: 248. Progression has improved. Findings include normal foveal contour, no SRF, intraretinal fluid (interval improvement in IRF/IRHM IT fovea and mac and SN mac).   Left Eye Quality was good. Central Foveal Thickness: 237. Progression has improved. Findings include normal foveal contour, no IRF, no SRF, intraretinal hyper-reflective material (stable improvement in central IRF/cystic changes, interval improvement in cystic changes IT mac, interval improvement in focal IRHM nasal fovea).   Notes *Images captured and stored on drive  Diagnosis / Impression:  OD: interval improvement in IRF/IRHM IT fovea and mac and SN mac OS: stable improvement in central IRF/cystic changes, interval improvement in cystic changes IT mac, interval improvement in focal IRHM nasal fovea  Clinical management:  See below  Abbreviations: NFP - Normal foveal profile. CME - cystoid macular edema. PED - pigment epithelial detachment. IRF - intraretinal fluid. SRF - subretinal fluid. EZ - ellipsoid zone. ERM - epiretinal membrane. ORA - outer retinal atrophy. ORT - outer retinal tubulation.  SRHM - subretinal hyper-reflective material. IRHM - intraretinal hyper-reflective material      Intravitreal Injection, Pharmacologic Agent - OS - Left Eye       Time Out 03/28/2022. 9:53 AM. Confirmed correct patient, procedure, site, and  patient consented.   Anesthesia Topical anesthesia was used. Anesthetic medications included Lidocaine 2%, Proparacaine 0.5%.   Procedure Preparation included 5% betadine to ocular surface, eyelid speculum. A (32g) needle was used.   Injection: 1.25 mg Bevacizumab 1.21m/0.05ml   Route: Intravitreal, Site: Left Eye   NDC:: 30076-226-33 Lot:: [id number] Expiration date: 04/11/2022   Post-op Post injection exam found visual acuity of at least counting fingers. The patient tolerated the procedure well. There were no complications. The patient received written and verbal post procedure care education. Post injection medications were not given.            ASSESSMENT/PLAN:    ICD-10-CM   1. Moderate nonproliferative diabetic retinopathy of both eyes with macular edema associated with type 2 diabetes mellitus ([name])  E11.3313 OCT, Retina - OU - Both Eyes    Intravitreal Injection, Pharmacologic Agent - OS - Left Eye    Bevacizumab (AVASTIN) SOLN 1.25 mg    2. Essential hypertension  I10     3. Hypertensive retinopathy of both eyes  H35.033     4. Combined forms of age-related cataract of both eyes  H25.813      1. Moderate nonproliferative diabetic retinopathy w/ DME OU - s/p IVA OS #1 (07.25.23), #2 (08.22.23), #3 (09.19.23), #4 (10.17.23), #5 (11.28.23)  - A1c 8.1 09/2021 per pt report, 8.8 on 02.15.23 in Care Everywhere - stressed to the patient getting the A1C under 7 could help improve vision. - pt working with Endodrinologist and blood glucose improving  - exam shows scattered MA OU - FA (06.21.22) shows late leaking MA, no NV OU - OCT OD: interval improvement in IRF/IRHM IT fovea and mac and SN mac OS: stable improvement in central IRF/cystic changes, interval improvement in cystic changes IT mac, interval improvement in focal IRHM nasal fovea at 7 wks - BCVA OD 20/20, OS 20/25 -- stable - recommend IVA OS #6 today 1.16.24 w/ ext f/u to 8 wks - RBA of procedure discussed,  questions answered - Avastin informed consent obtained and signed 07.25.23 - see procedure note  - f/u ext to 8 weeks -- DFE/OCT, possible injection  2,3. Hypertensive retinopathy OU - discussed importance of tight BP control - continue to monitor  4. Mixed Cataract OU - The symptoms of cataract, surgical options, and treatments and risks were discussed with patient. - discussed diagnosis and progression - continue to monitor  Ophthalmic Meds Ordered this visit:  Meds ordered this encounter  Medications   Bevacizumab (AVASTIN) SOLN 1.25 mg     Return in about 8 weeks (around 05/23/2022) for f/u NPDR OU, DFE, OCT.  There are no Patient Instructions on file for this visit.  Explained the diagnoses, plan, and follow up with the patient and they expressed understanding.  Patient expressed understanding of the importance of proper follow up care.   This document serves as a record of services personally performed by BGardiner Sleeper MD, PhD. It was created on their behalf by CRenaldo Reel [place] ophthalmic technician. The creation of this record is the provider's dictation and/or activities during the visit.    Electronically signed by:  CRenaldo Reel COT  1.02.24 10:01 PM  This document serves as a record of services personally performed by  Gardiner Sleeper, MD, PhD. It was created on their behalf by San Jetty. Owens Shark, OA an ophthalmic technician. The creation of this record is the provider's dictation and/or activities during the visit.    Electronically signed by: San Jetty. Owens Shark, [place] 01.16.2024 10:01 PM  Gardiner Sleeper, M.D., Ph.D. Diseases & Surgery of the Retina and Vitreous [facility]  I have reviewed the above documentation for accuracy and completeness, and I agree with the above. Gardiner Sleeper, M.D., Ph.D. 03/28/22 10:04 PM  Abbreviations: M myopia (nearsighted); A astigmatism; H hyperopia (farsighted); P presbyopia; Mrx spectacle  prescription;  CTL contact lenses; OD right eye; OS left eye; OU both eyes  XT exotropia; ET esotropia; PEK punctate epithelial keratitis; PEE punctate epithelial erosions; DES dry eye syndrome; MGD meibomian gland dysfunction; ATs artificial tears; PFAT's preservative free artificial tears; [place] nuclear sclerotic cataract; PSC posterior subcapsular cataract; ERM epi-retinal membrane; PVD posterior vitreous detachment; RD retinal detachment; DM diabetes mellitus; DR diabetic retinopathy; NPDR non-proliferative diabetic retinopathy; PDR proliferative diabetic retinopathy; CSME clinically significant macular edema; DME diabetic macular edema; dbh dot blot hemorrhages; CWS cotton wool spot; POAG primary open angle glaucoma; C/D cup-to-disc ratio; HVF humphrey visual field; GVF goldmann visual field; OCT optical coherence tomography; IOP intraocular pressure; BRVO Branch retinal vein occlusion; CRVO central retinal vein occlusion; CRAO central retinal artery occlusion; BRAO branch retinal artery occlusion; RT retinal tear; SB scleral buckle; PPV pars plana vitrectomy; VH Vitreous hemorrhage; PRP panretinal laser photocoagulation; IVK intravitreal kenalog; VMT vitreomacular traction; MH Macular hole;  NVD neovascularization of the disc; NVE neovascularization elsewhere; AREDS age related eye disease study; ARMD age related macular degeneration; POAG primary open angle glaucoma; EBMD epithelial/anterior basement membrane dystrophy; ACIOL anterior chamber intraocular lens; IOL intraocular lens; PCIOL posterior chamber intraocular lens; Phaco/IOL phacoemulsification with intraocular lens placement; [place] photorefractive keratectomy; LASIK laser assisted in situ keratomileusis; HTN hypertension; DM diabetes mellitus; COPD chronic obstructive pulmonary disease

## 2022-03-15 ENCOUNTER — Ambulatory Visit (INDEPENDENT_AMBULATORY_CARE_PROVIDER_SITE_OTHER): Payer: Medicare Other

## 2022-03-15 DIAGNOSIS — E119 Type 2 diabetes mellitus without complications: Secondary | ICD-10-CM | POA: Diagnosis not present

## 2022-03-15 DIAGNOSIS — Z89421 Acquired absence of other right toe(s): Secondary | ICD-10-CM

## 2022-03-15 DIAGNOSIS — L209 Atopic dermatitis, unspecified: Secondary | ICD-10-CM | POA: Diagnosis not present

## 2022-03-15 DIAGNOSIS — E1159 Type 2 diabetes mellitus with other circulatory complications: Secondary | ICD-10-CM

## 2022-03-15 NOTE — Progress Notes (Signed)
Patient presents to the office today for diabetic insole measuring.  Patient was measured with brannock device to determine size and width for foam casted for 3 pair of insoles.   ABN signed.   Documentation of medical necessity will be sent to patient's treating diabetic doctor to verify and sign.   Patient's diabetic provider:  Alyson Ingles Nurse Practitioner [place] [facility]    Insoles will be ordered at that time and patient will be notified for an appointment for fitting when they arrive.  Inserts ordered in 8.5 Wide

## 2022-03-21 ENCOUNTER — Other Ambulatory Visit: Payer: Self-pay | Admitting: Internal Medicine

## 2022-03-21 NOTE — Telephone Encounter (Signed)
Requested medication (s) are due for refill today- provider reveiw  Requested medication (s) are on the active medication list -yes  Future visit scheduled -no  Last refill: 03/26/21 30g  Notes to clinic: non delegated Rx  Requested Prescriptions  Pending Prescriptions Disp Refills   clobetasol cream (TEMOVATE) 0.05 % [Pharmacy Med Name: CLOBETASOL PROP 0.05% CREAM 30GM] 30 g 0    Sig: APPLY TOPICALLY TO [place]     Not Delegated - Dermatology:  Corticosteroids Failed - 03/21/2022  3:18 AM      Failed - This refill cannot be delegated      Failed - Valid encounter within last 12 months    Recent Outpatient Visits           1 year ago Type 2 diabetes mellitus with peripheral neuropathy ([place])   [place] Ladell Pier, MD   1 year ago Establishing care with new doctor, encounter for   [place], MD                 Requested Prescriptions  Pending Prescriptions Disp Refills   clobetasol cream (TEMOVATE) 0.05 % [Pharmacy Med Name: CLOBETASOL PROP 0.05% CREAM 30GM] 30 g 0    Sig: APPLY TOPICALLY TO [place]     Not Delegated - Dermatology:  Corticosteroids Failed - 03/21/2022  3:18 AM      Failed - This refill cannot be delegated      Failed - Valid encounter within last 12 months    Recent Outpatient Visits           1 year ago Type 2 diabetes mellitus with peripheral neuropathy [facility])   [place] Ladell Pier, MD   1 year ago Establishing care with new doctor, encounter for   [place] Ladell Pier, MD

## 2022-03-28 ENCOUNTER — Encounter (INDEPENDENT_AMBULATORY_CARE_PROVIDER_SITE_OTHER): Payer: Self-pay | Admitting: Ophthalmology

## 2022-03-28 ENCOUNTER — Ambulatory Visit (INDEPENDENT_AMBULATORY_CARE_PROVIDER_SITE_OTHER): Payer: 59 | Admitting: Ophthalmology

## 2022-03-28 DIAGNOSIS — E113313 Type 2 diabetes mellitus with moderate nonproliferative diabetic retinopathy with macular edema, bilateral: Secondary | ICD-10-CM

## 2022-03-28 DIAGNOSIS — H35033 Hypertensive retinopathy, bilateral: Secondary | ICD-10-CM

## 2022-03-28 DIAGNOSIS — H25813 Combined forms of age-related cataract, bilateral: Secondary | ICD-10-CM

## 2022-03-28 DIAGNOSIS — I10 Essential (primary) hypertension: Secondary | ICD-10-CM | POA: Diagnosis not present

## 2022-03-28 MED ORDER — BEVACIZUMAB CHEMO INJECTION 1.25MG/0.05ML SYRINGE FOR KALEIDOSCOPE
1.2500 mg | INTRAVITREAL | Status: AC | PRN
  Administered 2022-03-28: 1.25 mg via INTRAVITREAL

## 2022-03-31 NOTE — Progress Notes (Signed)
Remote ICD transmission.   

## 2022-04-13 DIAGNOSIS — E119 Type 2 diabetes mellitus without complications: Secondary | ICD-10-CM | POA: Diagnosis not present

## 2022-04-13 DIAGNOSIS — L209 Atopic dermatitis, unspecified: Secondary | ICD-10-CM | POA: Diagnosis not present

## 2022-04-17 ENCOUNTER — Ambulatory Visit: Payer: Medicare Other | Admitting: Podiatry

## 2022-05-09 NOTE — Progress Notes (Signed)
[facility] Note  05/23/2022     CHIEF COMPLAINT Patient presents for Retina Follow Up  HISTORY OF PRESENT ILLNESS: Jon Jones is a 67 y.o. male who presents to the clinic today for:   HPI     Retina Follow Up   Patient presents with  Diabetic Retinopathy.  In both eyes.  This started 8 weeks ago.  Duration of 8 weeks.  Since onset it is stable.  I, the attending physician,  performed the HPI with the patient and updated documentation appropriately.        Comments   8 week retina follow up NPDR and IVA OU pt is reporting no vision changes noticed his last reading was 172      Last edited by Bernarda Caffey, MD on 05/23/2022 12:20 PM.    Pt states vision stable  Referring physician: Shirleen Schirmer, PA-C [facility] [address] [place],  [place] [phone]  HISTORICAL INFORMATION:   Selected notes from the MEDICAL RECORD NUMBER Referred by Shirleen Schirmer, PA for eval of diabetic retinopathy OU; DME OS   CURRENT MEDICATIONS: Current Outpatient Medications (Ophthalmic Drugs)  Medication Sig   RESTASIS 0.05 % ophthalmic emulsion Place 1 drop into both eyes in the morning and at bedtime.   No current facility-administered medications for this visit. (Ophthalmic Drugs)   Current Outpatient Medications (Other)  Medication Sig   ACCU-CHEK AVIVA PLUS test strip TEST 3 TIMES DAILY   Accu-Chek Softclix Lancets lancets CHECK BLOOD SUGAR ONCE DAILY   acetaminophen (TYLENOL) 325 MG tablet Take 2 tablets (650 mg total) by mouth every 6 (six) hours as needed for mild pain (or Fever >/= 101).   aspirin 81 MG EC tablet Take 81 mg by mouth daily.   atorvastatin (LIPITOR) 80 MG tablet Take 1 tablet (80 mg total) by mouth every evening.   ciclopirox (PENLAC) 8 % solution Apply topically at bedtime. Apply over nail and surrounding skin. Apply daily over previous coat. After seven (7) days, may remove with alcohol and continue cycle.    clobetasol cream (TEMOVATE) 6.50 % Apply 1 application topically 2 (two) times daily.   clopidogrel (PLAVIX) 75 MG tablet TAKE 1 TABLET BY MOUTH EVERY DAY   empagliflozin (JARDIANCE) 25 MG TABS tablet TAKE 1 TABLET(25 MG) BY MOUTH AT BEDTIME   gabapentin (NEURONTIN) 300 MG capsule TAKE 1 CAPSULE(300 MG) BY MOUTH AT BEDTIME   glipiZIDE (GLUCOTROL) 10 MG tablet TAKE 2 TABLETS(20 MG) BY MOUTH TWICE DAILY   JANUMET XR [phone] MG TB24 Take 1 tablet by mouth daily.   metoprolol succinate (TOPROL-XL) 100 MG 24 hr tablet Take 1 tablet (100 mg total) by mouth daily.   Misc. Devices MISC Cpap supplies-tubing mask Dx G47.33 and Z99.89   nitroGLYCERIN (NITROSTAT) 0.4 MG SL tablet PLACE 1 TABLET UNDER THE TONGUE EVERY 5 MINS AS NEEDED FOR CHEST PAIN. MAXIMUM OF 3 DOSES   nystatin cream (MYCOSTATIN) SMARTSIG:Sparingly Topical 2-3 Times Daily PRN   OZEMPIC, 1 MG/DOSE, 4 MG/3ML SOPN Inject 1 mg into the skin once a week.   sacubitril-valsartan (ENTRESTO) 24-26 MG Take 1 tablet by mouth 2 (two) times daily.   Semaglutide,0.25 or 0.5MG /DOS, (OZEMPIC, 0.25 OR 0.5 MG/DOSE,) 2 MG/1.5ML SOPN Inject 0.25 mg into the skin once a week.   sildenafil (VIAGRA) 100 MG tablet Take 100 mg by mouth daily as needed.   spironolactone (ALDACTONE) 25 MG tablet Take 0.5 tablets (12.5 mg total) by mouth  every other day.   triamcinolone ointment (KENALOG) 0.1 % SMARTSIG:sparingly Topical Twice Daily   VENTOLIN HFA 108 (90 Base) MCG/ACT inhaler INHALE 2 PUFFS BY MOUTH EVERY 6 HOURS AS NEEDED FOR WHEEZING OR SHORTNESS OF BREATH   No current facility-administered medications for this visit. (Other)   REVIEW OF SYSTEMS: ROS   Positive for: Endocrine, Cardiovascular, Eyes, Respiratory Negative for: Constitutional, Gastrointestinal, Neurological, Skin, Genitourinary, Musculoskeletal, HENT, Psychiatric, Allergic/Imm, Heme/Lymph Last edited by Parthenia Ames, COT on 05/23/2022  9:08 AM.      ALLERGIES No Known  Allergies  PAST MEDICAL HISTORY Past Medical History:  Diagnosis Date   5 years ago    AICD (automatic cardioverter/defibrillator) present    Anxiety    Arthritis    Cataract    CHF (congestive heart failure) ([name])    Coronary artery disease    Diabetes mellitus without complication ([place])    Diabetic retinopathy ([place])    Dysrhythmia    GERD (gastroesophageal reflux disease)    PMH   Hypertension    Hypertensive retinopathy    Peripheral vascular disease ([name])    Peripheral vascular disease ([place])    Pneumonia    Shoulder impingement, right    Sleep apnea    wears cpap   Stomach ulcer    Wears glasses    Past Surgical History:  Procedure Laterality Date   AMPUTATION Left 03/12/2019   Procedure: 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;  Surgeon: [name], MD;  Location: [place];  Service: Orthopedics;  Laterality: Left;   AMPUTATION Right 10/01/2020   Procedure: RIGHT 2nd TOE AMPUTATION;  Surgeon: Felipa Furnace, DPM;  Location: [place];  Service: Podiatry;  Laterality: Right;   CARDIAC CATHETERIZATION     CARDIAC DEFIBRILLATOR PLACEMENT     CIRCUMCISION N/A 11/05/2020   Procedure: CIRCUMCISION ADULT;  Surgeon: Irine Seal, MD;  Location: [facility];  Service: Urology;  Laterality: N/A;   I & D EXTREMITY Left 07/19/2016   Procedure: IRRIGATION AND DEBRIDEMENT EXTREMITY/OLECRANON(WASHOUT);  Surgeon: Leandrew Koyanagi, MD;  Location: [place];  Service: Orthopedics;  Laterality: Left;   OLECRANON BURSECTOMY Left 07/19/2016   Procedure: LEFT ELBOW OLECRANON BURSECTOMY;  Surgeon: Leandrew Koyanagi, MD;  Location: [place];  Service: Orthopedics;  Laterality: Left;   SHOULDER ARTHROSCOPY Right 05/16/2019   Procedure: Right Shoulder Arthroscopy;  Surgeon: [name], MD;  Location: [place];  Service: Orthopedics;  Laterality: Right;   FAMILY HISTORY Family History  Problem Relation Age of Onset   Heart disease Mother    Diabetes Mother    Peripheral vascular disease Mother    SOCIAL  HISTORY Social History   Tobacco Use   Smoking status: Former    Packs/day: 1.00    Years: 40.00    Total pack years: 40.00    Types: Cigarettes    Quit date: 03/11/2019    Years since quitting: 3.2   Smokeless tobacco: Never  Vaping Use   Vaping Use: Never used  Substance Use Topics   Alcohol use: Not Currently   Drug use: Not Currently    Types: Oxycodone       OPHTHALMIC EXAM:  Base Eye Exam     Visual Acuity (Snellen - Linear)       Right Left   Dist cc 20/20 -2 20/30 -2    Correction: Glasses         Tonometry (Tonopen, 9:13 AM)       Right Left   Pressure  12 14         Pupils       Pupils Dark Light Shape React APD   Right PERRL 3 2 Round Brisk None   Left PERRL 3 2 Round Brisk None         Visual Fields       Left Right    Full Full         Extraocular Movement       Right Left    Full, Ortho Full, Ortho         Neuro/Psych     Oriented x3: Yes   Mood/Affect: Normal         Dilation     Both eyes: 2.5% Phenylephrine @ 9:12 AM           Slit Lamp and Fundus Exam     Slit Lamp Exam       Right Left   Lids/Lashes Dermatochalasis - upper lid Dermatochalasis - upper lid   Conjunctiva/Sclera nasal pingeucula nasal pingeucula   Cornea arcus, tear film debris, 1+ PEE arcus, tear film debris, 1+PEE, old subepithelial scars inferior paracentral with haze   Anterior Chamber Deep and quiet Deep and quiet   Iris Round and dilated, No NVI Round and dilated, No NVI   Lens 2-3+ Nuclear sclerosis with +brunescence, 2-3+ Cortical cataract 2-3+ Nuclear sclerosis with +brunescence, 2-3+ Cortical cataract   Anterior Vitreous Vitreous syneresis Vitreous syneresis         Fundus Exam       Right Left   Disc Pink and Sharp, Compact Pink and Sharp, Compact   C/D Ratio 0.1 0.2   Macula Flat, good foveal reflex, +MA / DBH greatest temporal macula, cystic changes temporal macula Flat, good foveal reflex, +MA/DBH, scattered cystic  changes -- improved   Vessels attenuated, Tortuous attenuated, Tortuous   Periphery Attached; scattered DBH greatest posteriorly Attached; scattered MA/DBH           Refraction     Wearing Rx       Sphere Cylinder Axis Add   Right +1.25 +1.00 137 +2.50   Left +1.00 +0.50 030 +2.50    Type: PAL           IMAGING AND PROCEDURES  Imaging and Procedures for 05/23/2022  OCT, Retina - OU - Both Eyes       Right Eye Quality was good. Central Foveal Thickness: 248. Progression has improved. Findings include normal foveal contour, no SRF, intraretinal fluid (interval increase in IRF/IRHM temporal and SN mac).   Left Eye Quality was good. Central Foveal Thickness: 228. Progression has improved. Findings include normal foveal contour, no IRF, no SRF, intraretinal hyper-reflective material (stable improvement in central IRF/cystic changes, interval improvement in cystic changes IT mac, interval improvement in focal IRHM nasal fovea).   Notes *Images captured and stored on drive  Diagnosis / Impression:  OD: interval increase in IRF/IRHM temporal and SN mac OS: stable improvement in central IRF/cystic changes, interval improvement in cystic changes IT mac, interval improvement in focal IRHM nasal fovea  Clinical management:  See below  Abbreviations: NFP - Normal foveal profile. CME - cystoid macular edema. PED - pigment epithelial detachment. IRF - intraretinal fluid. SRF - subretinal fluid. EZ - ellipsoid zone. ERM - epiretinal membrane. ORA - outer retinal atrophy. ORT - outer retinal tubulation. SRHM - subretinal hyper-reflective material. IRHM - intraretinal hyper-reflective material            ASSESSMENT/PLAN:  ICD-10-CM   1. Moderate nonproliferative diabetic retinopathy of both eyes with macular edema associated with type 2 diabetes mellitus ([name])  E11.3313 OCT, Retina - OU - Both Eyes    CANCELED: Intravitreal Injection, Pharmacologic Agent - OS - Left Eye     2. Essential hypertension  I10     3. Hypertensive retinopathy of both eyes  H35.033     4. Combined forms of age-related cataract of both eyes  H25.813      1. Moderate nonproliferative diabetic retinopathy w/ DME OU - s/p IVA OS #1 (07.25.23), #2 (08.22.23), #3 (09.19.23), #4 (10.17.23), #5 (11.28.23), #6 (01.16.24)  - A1c 8.1 09/2021 per pt report, 8.8 on 02.15.23 in Care Everywhere - stressed to the patient getting the A1C under 7 could help improve vision. - pt working with Endodrinologist and blood glucose improving  - exam shows scattered MA OU - FA (06.21.22) shows late leaking MA, no NV OU - OCT OD: interval increase in IRF/IRHM temporal and SN mac OS: stable improvement in central IRF/cystic changes, interval improvement in cystic changes IT mac, interval improvement in focal IRHM nasal fovea at 8 wks - BCVA OD 20/20, OS 20/30 -- decreased OS -- suspect mostly cataract progression - recommend holding injection today with follow up in 8 weeks - Avastin informed consent obtained and signed 07.25.23 - see procedure note  - f/u 8 weeks -- DFE/OCT, possible injection  2,3. Hypertensive retinopathy OU - discussed importance of tight BP control - continue to monitor  4. Mixed Cataract OU - The symptoms of cataract, surgical options, and treatments and risks were discussed with patient. - discussed diagnosis and progression - suspect BCVA OS declining due to cataract progression - will refer back to [facility] for evaluation of cataracts  Ophthalmic Meds Ordered this visit:  No orders of the defined types were placed in this encounter.    Return in about 8 weeks (around 07/18/2022) for f/u NPDR OU, DFE, OCT.  There are no Patient Instructions on file for this visit.  Explained the diagnoses, plan, and follow up with the patient and they expressed understanding.  Patient expressed understanding of the importance of proper follow up care.   This document serves as a  record of services personally performed by Gardiner Sleeper, MD, PhD. It was created on their behalf by Renaldo Reel, [place] an ophthalmic technician. The creation of this record is the provider's dictation and/or activities during the visit.    Electronically signed by:  Renaldo Reel, COT  02.27.24 1:14 PM  This document serves as a record of services personally performed by Gardiner Sleeper, MD, PhD. It was created on their behalf by San Jetty. Owens Shark, OA an ophthalmic technician. The creation of this record is the provider's dictation and/or activities during the visit.    Electronically signed by: San Jetty. Owens Shark, [place] 03.12.2024 1:14 PM   Gardiner Sleeper, M.D., Ph.D. Diseases & Surgery of the Retina and Vitreous [facility]  I have reviewed the above documentation for accuracy and completeness, and I agree with the above. Gardiner Sleeper, M.D., Ph.D. 05/23/22 1:17 PM   Abbreviations: M myopia (nearsighted); A astigmatism; H hyperopia (farsighted); P presbyopia; Mrx spectacle prescription;  CTL contact lenses; OD right eye; OS left eye; OU both eyes  XT exotropia; ET esotropia; PEK punctate epithelial keratitis; PEE punctate epithelial erosions; DES dry eye syndrome; MGD meibomian gland dysfunction; ATs artificial tears; PFAT's preservative free artificial tears; [place]  nuclear sclerotic cataract; PSC posterior subcapsular cataract; ERM epi-retinal membrane; PVD posterior vitreous detachment; RD retinal detachment; DM diabetes mellitus; DR diabetic retinopathy; NPDR non-proliferative diabetic retinopathy; PDR proliferative diabetic retinopathy; CSME clinically significant macular edema; DME diabetic macular edema; dbh dot blot hemorrhages; CWS cotton wool spot; POAG primary open angle glaucoma; C/D cup-to-disc ratio; HVF humphrey visual field; GVF goldmann visual field; OCT optical coherence tomography; IOP intraocular pressure; BRVO Branch retinal vein occlusion; CRVO  central retinal vein occlusion; CRAO central retinal artery occlusion; BRAO branch retinal artery occlusion; RT retinal tear; SB scleral buckle; PPV pars plana vitrectomy; VH Vitreous hemorrhage; PRP panretinal laser photocoagulation; IVK intravitreal kenalog; VMT vitreomacular traction; MH Macular hole;  NVD neovascularization of the disc; NVE neovascularization elsewhere; AREDS age related eye disease study; ARMD age related macular degeneration; POAG primary open angle glaucoma; EBMD epithelial/anterior basement membrane dystrophy; ACIOL anterior chamber intraocular lens; IOL intraocular lens; PCIOL posterior chamber intraocular lens; Phaco/IOL phacoemulsification with intraocular lens placement; [place] photorefractive keratectomy; LASIK laser assisted in situ keratomileusis; HTN hypertension; DM diabetes mellitus; COPD chronic obstructive pulmonary disease

## 2022-05-10 ENCOUNTER — Ambulatory Visit (INDEPENDENT_AMBULATORY_CARE_PROVIDER_SITE_OTHER): Payer: 59 | Admitting: Podiatry

## 2022-05-10 ENCOUNTER — Encounter: Payer: Self-pay | Admitting: Podiatry

## 2022-05-10 ENCOUNTER — Ambulatory Visit: Payer: 59 | Admitting: Podiatry

## 2022-05-10 DIAGNOSIS — Z89421 Acquired absence of other right toe(s): Secondary | ICD-10-CM

## 2022-05-10 DIAGNOSIS — E1159 Type 2 diabetes mellitus with other circulatory complications: Secondary | ICD-10-CM | POA: Diagnosis not present

## 2022-05-10 DIAGNOSIS — M79675 Pain in left toe(s): Secondary | ICD-10-CM

## 2022-05-10 DIAGNOSIS — M79674 Pain in right toe(s): Secondary | ICD-10-CM

## 2022-05-10 DIAGNOSIS — B351 Tinea unguium: Secondary | ICD-10-CM | POA: Diagnosis not present

## 2022-05-10 NOTE — Progress Notes (Signed)
This patient returns to my office for at risk foot care.  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  This patient presents for at risk foot care today.  General Appearance  Alert, conversant and in no acute stress.  Vascular  Dorsalis pedis and posterior tibial  pulses are weakly  palpable  bilaterally.  Capillary return is within normal limits  bilaterally. Temperature is within normal limits  bilaterally.  Neurologic  Senn-Weinstein monofilament wire test diminished  bilaterally. Muscle power within normal limits bilaterally.  Nails Thick disfigured discolored nails with subungual debris  from  1-4  toes left. and 1,3-5 toes right. . No evidence of bacterial infection or drainage bilaterally.  Orthopedic  No limitations of motion  feet .  No crepitus or effusions noted.  No bony pathology or digital deformities noted. Hallux valgus.  Hammer toes  B/L.  Skin  normotropic skin with no porokeratosis noted bilaterally.  No signs of infections or ulcers noted.     Onychomycosis  Pain in right toes  Pain in left toes  Consent was obtained for treatment procedures.   Mechanical debridement of nails 1-4 left and 1,3-5 right performed with a nail nipper.  Filed with dremel without incident. Patient qualifies for diabetic shoes due to DPN and HAV  B/L.   Return office visit   3 months                   Told patient to return for periodic foot care and evaluation due to potential at risk complications.   Gardiner Barefoot DPM

## 2022-05-12 DIAGNOSIS — L209 Atopic dermatitis, unspecified: Secondary | ICD-10-CM | POA: Diagnosis not present

## 2022-05-12 DIAGNOSIS — E119 Type 2 diabetes mellitus without complications: Secondary | ICD-10-CM | POA: Diagnosis not present

## 2022-05-23 ENCOUNTER — Ambulatory Visit (INDEPENDENT_AMBULATORY_CARE_PROVIDER_SITE_OTHER): Payer: 59 | Admitting: Ophthalmology

## 2022-05-23 ENCOUNTER — Encounter (INDEPENDENT_AMBULATORY_CARE_PROVIDER_SITE_OTHER): Payer: Self-pay | Admitting: Ophthalmology

## 2022-05-23 DIAGNOSIS — H35033 Hypertensive retinopathy, bilateral: Secondary | ICD-10-CM | POA: Diagnosis not present

## 2022-05-23 DIAGNOSIS — I10 Essential (primary) hypertension: Secondary | ICD-10-CM

## 2022-05-23 DIAGNOSIS — H25813 Combined forms of age-related cataract, bilateral: Secondary | ICD-10-CM

## 2022-05-23 DIAGNOSIS — E113313 Type 2 diabetes mellitus with moderate nonproliferative diabetic retinopathy with macular edema, bilateral: Secondary | ICD-10-CM | POA: Diagnosis not present

## 2022-06-01 DIAGNOSIS — Z7189 Other specified counseling: Secondary | ICD-10-CM | POA: Diagnosis not present

## 2022-06-01 DIAGNOSIS — I509 Heart failure, unspecified: Secondary | ICD-10-CM | POA: Diagnosis not present

## 2022-06-01 DIAGNOSIS — Z0000 Encounter for general adult medical examination without abnormal findings: Secondary | ICD-10-CM | POA: Diagnosis not present

## 2022-06-01 DIAGNOSIS — I10 Essential (primary) hypertension: Secondary | ICD-10-CM | POA: Diagnosis not present

## 2022-06-01 DIAGNOSIS — E119 Type 2 diabetes mellitus without complications: Secondary | ICD-10-CM | POA: Diagnosis not present

## 2022-06-01 DIAGNOSIS — Z13820 Encounter for screening for osteoporosis: Secondary | ICD-10-CM | POA: Diagnosis not present

## 2022-06-01 DIAGNOSIS — E785 Hyperlipidemia, unspecified: Secondary | ICD-10-CM | POA: Diagnosis not present

## 2022-06-01 DIAGNOSIS — Z136 Encounter for screening for cardiovascular disorders: Secondary | ICD-10-CM | POA: Diagnosis not present

## 2022-06-01 DIAGNOSIS — Z1211 Encounter for screening for malignant neoplasm of colon: Secondary | ICD-10-CM | POA: Diagnosis not present

## 2022-06-02 ENCOUNTER — Other Ambulatory Visit: Payer: Self-pay | Admitting: Physician Assistant

## 2022-06-02 DIAGNOSIS — Z13820 Encounter for screening for osteoporosis: Secondary | ICD-10-CM

## 2022-06-06 ENCOUNTER — Ambulatory Visit (INDEPENDENT_AMBULATORY_CARE_PROVIDER_SITE_OTHER): Payer: 59

## 2022-06-06 DIAGNOSIS — I5042 Chronic combined systolic (congestive) and diastolic (congestive) heart failure: Secondary | ICD-10-CM

## 2022-06-06 LAB — CUP PACEART REMOTE DEVICE CHECK
Battery Remaining Longevity: 51 mo
Battery Voltage: 2.97 V
Brady Statistic AP VP Percent: 0.01 %
Brady Statistic AP VS Percent: 0.52 %
Brady Statistic AS VP Percent: 0.03 %
Brady Statistic AS VS Percent: 99.44 %
Brady Statistic RA Percent Paced: 0.53 %
Brady Statistic RV Percent Paced: 0.04 %
Date Time Interrogation Session: 20240326012404
HighPow Impedance: 75 Ohm
Implantable Lead Connection Status: 753985
Implantable Lead Connection Status: 753985
Implantable Lead Implant Date: 20180702
Implantable Lead Implant Date: 20180702
Implantable Lead Location: 753859
Implantable Lead Location: 753860
Implantable Lead Model: 5076
Implantable Pulse Generator Implant Date: 20180702
Lead Channel Impedance Value: 399 Ohm
Lead Channel Impedance Value: 437 Ohm
Lead Channel Impedance Value: 513 Ohm
Lead Channel Pacing Threshold Amplitude: 0.5 V
Lead Channel Pacing Threshold Amplitude: 0.625 V
Lead Channel Pacing Threshold Pulse Width: 0.4 ms
Lead Channel Pacing Threshold Pulse Width: 0.4 ms
Lead Channel Sensing Intrinsic Amplitude: 2.875 mV
Lead Channel Sensing Intrinsic Amplitude: 2.875 mV
Lead Channel Sensing Intrinsic Amplitude: 5 mV
Lead Channel Sensing Intrinsic Amplitude: 5 mV
Lead Channel Setting Pacing Amplitude: 1.5 V
Lead Channel Setting Pacing Amplitude: 2 V
Lead Channel Setting Pacing Pulse Width: 0.4 ms
Lead Channel Setting Sensing Sensitivity: 0.3 mV
Zone Setting Status: 755011
Zone Setting Status: 755011

## 2022-06-13 DIAGNOSIS — L209 Atopic dermatitis, unspecified: Secondary | ICD-10-CM | POA: Diagnosis not present

## 2022-06-13 DIAGNOSIS — E119 Type 2 diabetes mellitus without complications: Secondary | ICD-10-CM | POA: Diagnosis not present

## 2022-06-14 ENCOUNTER — Ambulatory Visit (INDEPENDENT_AMBULATORY_CARE_PROVIDER_SITE_OTHER): Payer: 59 | Admitting: Family

## 2022-06-14 ENCOUNTER — Telehealth: Payer: Self-pay | Admitting: Physical Medicine and Rehabilitation

## 2022-06-14 ENCOUNTER — Encounter: Payer: Self-pay | Admitting: Family

## 2022-06-14 DIAGNOSIS — M170 Bilateral primary osteoarthritis of knee: Secondary | ICD-10-CM

## 2022-06-14 DIAGNOSIS — M25511 Pain in right shoulder: Secondary | ICD-10-CM

## 2022-06-14 DIAGNOSIS — G8929 Other chronic pain: Secondary | ICD-10-CM

## 2022-06-14 MED ORDER — METHYLPREDNISOLONE ACETATE 40 MG/ML IJ SUSP
40.0000 mg | INTRAMUSCULAR | Status: AC | PRN
  Administered 2022-06-14: 40 mg via INTRA_ARTICULAR

## 2022-06-14 MED ORDER — LIDOCAINE HCL 1 % IJ SOLN
5.0000 mL | INTRAMUSCULAR | Status: AC | PRN
  Administered 2022-06-14: 5 mL

## 2022-06-14 NOTE — Progress Notes (Signed)
Office Visit Note   [name]           Date of Birth: January 02, 1956           MRN: [id number] Visit Date: 06/14/2022              Requested by: No referring provider defined for this encounter. PCP: Pcp, No  Chief Complaint  Patient presents with   Right Shoulder - Pain    Last had right shoulder and bilateral knee injections 02/21/2022   Left Knee - Pain   Right Knee - Pain      HPI: The patient is a 67 year old gentleman who comes in today complaining of return of his chronic bilateral knee pain as well as his impingement symptoms of the right shoulder.  He has been having difficulty doing his activities of daily living  is requesting Depo-Medrol injection of all 3 joints today.  He has previously had adequate relief with Depo-Medrol injections of these joints.  Assessment & Plan: Visit Diagnoses: No diagnosis found.  Plan: Depo-Medrol injection right shoulder bilateral knees.  Patient tolerated well.  Follow-Up Instructions: Return in about 3 months (around 09/13/2022), or if symptoms worsen or fail to improve.   Ortho Exam  Patient is alert, oriented, no adenopathy, well-dressed, normal affect, normal respiratory effort. On examination of the right shoulder patient has abduction and flexion about 90 degrees.  Crepitation with range of motion.  He has good internal and external rotation with passive range of motion to 120 degrees.  No adhesive capsulitis.  Bilateral knees without edema or erythema there is medial joint line tenderness.  Collaterals and cruciates are stable.  Imaging: No results found. No images are attached to the encounter.  Labs: Lab Results  Component Value Date   HGBA1C 8.7 (H) 10/19/2020   HGBA1C 9.8 (H) 09/08/2020   HGBA1C 11.0 (A) 07/23/2020   ESRSEDRATE 11 03/11/2019   CRP 1.8 (H) 03/11/2019   REPTSTATUS 10/04/2020 FINAL 09/29/2020   GRAMSTAIN  07/19/2016    RARE WBC PRESENT, PREDOMINANTLY MONONUCLEAR NO ORGANISMS SEEN     CULT  09/29/2020    NO GROWTH 5 DAYS Performed at [facility], [place] [address], [place], [place] [address]      Lab Results  Component Value Date   ALBUMIN 3.5 09/29/2020   ALBUMIN 4.2 04/26/2020   ALBUMIN 3.7 03/11/2019    No results found for: "MG" No results found for: "VD25OH"  No results found for: "PREALBUMIN"    Latest Ref Rng & Units 10/19/2020   10:59 AM 10/01/2020    5:08 AM 09/30/2020    4:56 AM  CBC EXTENDED  WBC 4.0 - 10.5 K/uL 8.0  7.4  8.4   RBC 4.22 - 5.81 MIL/uL 4.45  4.32  3.99   Hemoglobin 13.0 - 17.0 g/dL 13.2  12.8  12.0   HCT 39.0 - 52.0 % 42.3  40.1  36.6   Platelets 150 - 400 K/uL 183  202  170      There is no height or weight on file to calculate BMI.  Orders:  No orders of the defined types were placed in this encounter.  No orders of the defined types were placed in this encounter.    Procedures: Large Joint Inj: bilateral knee on 06/14/2022 9:45 AM Indications: pain Details: 18 G 1.5 in needle, anteromedial approach Medications (Right): 5 mL lidocaine 1 %; 40 mg methylPREDNISolone acetate 40 MG/ML Medications (Left): 5 mL lidocaine 1 %;  40 mg methylPREDNISolone acetate 40 MG/ML Consent was given by the patient.    Large Joint Inj: R subacromial bursa on 06/14/2022 9:45 AM Indications: pain Details: 22 G 1.5 in needle Medications: 5 mL lidocaine 1 %; 40 mg methylPREDNISolone acetate 40 MG/ML Consent was given by the patient.      Clinical Data: No additional findings.  ROS:  All other systems negative, except as noted in the HPI. Review of Systems  Constitutional: Negative.   Musculoskeletal:  Positive for arthralgias and gait problem.    Objective: Vital Signs: There were no vitals taken for this visit.  Specialty Comments:  Ct Myelogram  IMPRESSION:  1. L5-S1: Severe multifactorial spinal stenosis. Advanced bilateral  facet arthropathy with 5 mm of anterolisthesis which increases to 8  mm with standing and  flexion. Bulging of the disc. 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 Marked constriction of the thecal sac  due to these processes in combination with abundant epidural fat.  Severe bilateral foraminal stenosis could compress either or both  exiting L5 nerves.  2. L4-5: Moderate multifactorial spinal stenosis.  3. T12-L1: Right foraminal encroachment by osteophyte could possibly  affect the right T12 nerve.    Aortic Atherosclerosis (ICD10-I70.0).      Electronically Signed    By: Nelson Chimes M.D.    On: 06/29/2020 12:39  PMFS History: Patient Active Problem List   Diagnosis Date Noted   Diabetic neuropathy 10/31/2021   Pain due to onychomycosis of toenails of both feet 01/21/2021   Phimosis of penis 10/06/2020   Cellulitis 09/29/2020   Cellulitis of right toe 09/29/2020   Chronic combined systolic and diastolic heart failure [id number]   Onychomycosis of toenail 08/24/2020   Diabetes mellitus 07/23/2020   Type 2 diabetes mellitus with hyperglycemia, without long-term current use of insulin 07/23/2020   Weight gain 07/23/2020   Type 2 diabetes mellitus with peripheral neuropathy 07/23/2020   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 diabetes mellitus 05/04/2020   Hypertensive heart disease with heart failure 04/26/2020   Type 2 diabetes mellitus with diabetic peripheral angiopathy without gangrene, without long-term current use of insulin 04/26/2020   OSA on CPAP 04/26/2020   PAD (peripheral artery disease) 04/26/2020   CAD in native artery 04/26/2020   Class 2 severe obesity due to excess calories with serious comorbidity and body mass index (BMI) of 38.0 to 38.9 in adult 04/26/2020   Impingement syndrome of right shoulder    Nontraumatic complete tear of right rotator cuff    Type 2 diabetes mellitus with foot ulcer, without long-term current use of insulin    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 07/27/2016   S/P debridement 07/19/2016   Acute gout of left elbow 07/19/2016   Past Medical History:  Diagnosis Date   5 years ago    AICD (automatic cardioverter/defibrillator) present    Anxiety    Arthritis    Cataract    CHF (congestive heart failure)    Coronary artery disease    Diabetes mellitus without complication    Diabetic retinopathy    Dysrhythmia    GERD (gastroesophageal reflux disease)    PMH   Hypertension    Hypertensive retinopathy    Peripheral vascular disease    Peripheral vascular disease    Pneumonia    Shoulder impingement, right    Sleep apnea    wears cpap  Stomach ulcer    Wears glasses     Family History  Problem Relation Age of Onset   Heart disease Mother    Diabetes Mother    Peripheral vascular disease Mother     Past Surgical History:  Procedure Laterality Date   AMPUTATION Left 03/12/2019   Procedure: 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;  Surgeon: [name], MD;  Location: [facility];  Service: Orthopedics;  Laterality: Left;   AMPUTATION Right 10/01/2020   Procedure: RIGHT 2nd TOE AMPUTATION;  Surgeon: Felipa Furnace, DPM;  Location: [facility];  Service: Podiatry;  Laterality: Right;   CARDIAC CATHETERIZATION     CARDIAC DEFIBRILLATOR PLACEMENT     CIRCUMCISION N/A 11/05/2020   Procedure: CIRCUMCISION ADULT;  Surgeon: Irine Seal, MD;  Location: [facility];  Service: Urology;  Laterality: N/A;   I & D EXTREMITY Left 07/19/2016   Procedure: IRRIGATION AND DEBRIDEMENT EXTREMITY/OLECRANON(WASHOUT);  Surgeon: Leandrew Koyanagi, MD;  Location: [facility];  Service: Orthopedics;  Laterality: Left;   OLECRANON BURSECTOMY Left 07/19/2016   Procedure: LEFT ELBOW OLECRANON BURSECTOMY;  Surgeon: Leandrew Koyanagi, MD;  Location: [facility];  Service: Orthopedics;  Laterality: Left;   SHOULDER ARTHROSCOPY Right 05/16/2019   Procedure: Right Shoulder Arthroscopy;  Surgeon: [name], MD;  Location: [facility];  Service: Orthopedics;  Laterality: Right;    Social History   Occupational History   Not on file  Tobacco Use   Smoking status: Former    Packs/day: 1.00    Years: 40.00    Additional pack years: 0.00    Total pack years: 40.00    Types: Cigarettes    Quit date: 03/11/2019    Years since quitting: 3.2   Smokeless tobacco: Never  Vaping Use   Vaping Use: Never used  Substance and Sexual Activity   Alcohol use: Not Currently   Drug use: Not Currently    Types: Oxycodone   Sexual activity: Not on file

## 2022-06-14 NOTE — Telephone Encounter (Signed)
Patient would like an appointment with Dr. Ernestina Patches. His number is [phone]

## 2022-06-15 ENCOUNTER — Other Ambulatory Visit: Payer: Self-pay | Admitting: Physical Medicine and Rehabilitation

## 2022-06-15 DIAGNOSIS — M5416 Radiculopathy, lumbar region: Secondary | ICD-10-CM

## 2022-06-15 DIAGNOSIS — M48062 Spinal stenosis, lumbar region with neurogenic claudication: Secondary | ICD-10-CM

## 2022-06-15 NOTE — Telephone Encounter (Signed)
Spoke with patient and he is requesting an injection. He stated it is the same type of pain just on the right side. The left side is doing great per patient. No new falls, injuries or accidents. Please advise

## 2022-06-15 NOTE — Progress Notes (Signed)
Patient reports greater than 80% relief for over 3 months with right L4 and S1 transforaminal ESI on 02/23/2022. He also reports increased functional ability post injection.

## 2022-07-03 ENCOUNTER — Other Ambulatory Visit: Payer: Self-pay

## 2022-07-03 ENCOUNTER — Encounter: Payer: Self-pay | Admitting: Physical Medicine and Rehabilitation

## 2022-07-03 ENCOUNTER — Ambulatory Visit (INDEPENDENT_AMBULATORY_CARE_PROVIDER_SITE_OTHER): Payer: 59 | Admitting: Physical Medicine and Rehabilitation

## 2022-07-03 VITALS — BP 114/75 | HR 83 | Ht 66.0 in | Wt 227.0 lb

## 2022-07-03 DIAGNOSIS — M5416 Radiculopathy, lumbar region: Secondary | ICD-10-CM

## 2022-07-03 MED ORDER — METHYLPREDNISOLONE ACETATE 80 MG/ML IJ SUSP
80.0000 mg | Freq: Once | INTRAMUSCULAR | Status: AC
Start: 2022-07-03 — End: 2022-07-03
  Administered 2022-07-03: 80 mg

## 2022-07-03 NOTE — Patient Instructions (Signed)

## 2022-07-03 NOTE — Progress Notes (Signed)
Patient presents with pain right buttocks. The pain does not radiate down his leg. He feels like this is the same pain that he had previously on the left. He has great difficulty getting comfortable. He is not taking anything for pain. He had previous injection 02/23/2022 for this pain on the left side with 100% relief. Patient takes Aspirin 81 mg daily as well as Plavix .  Functional Pain Scale - descriptive words and definitions  Distracting (5)    Aware of pain/able to complete some ADL's but limited by pain/sleep is affected and active distractions are only slightly useful. Moderate range order  Average Pain 5   +Driver, -Dye Allergies.

## 2022-07-04 NOTE — Progress Notes (Signed)
[facility] - Clinic Note  07/18/2022     CHIEF COMPLAINT Patient presents for Retina Follow Up  HISTORY OF PRESENT ILLNESS: Jon Jones is a 67 y.o. male who presents to the clinic today for:   HPI     Retina Follow Up   Patient presents with  Diabetic Retinopathy.  In left eye.  This started 8 weeks ago.  Duration of 8 weeks.  Since onset it is stable.  I, the attending physician,  performed the HPI with the patient and updated documentation appropriately.        Comments   8 week retina follow up NPDR OU and IVA OS pt is reporting dry eye she denies any vision changes noticed he denies any flashes or floaters       Last edited by Rennis Chris, MD on 07/22/2022  1:51 AM.    Pt saw Dr. Dione Booze on May 2 for cataract eval, he states Dr. Dione Booze gave him drops and ointment for dry eyes and is going to bring him back in 2 weeks for calculations  Referring physician: Alma Downs, PA-C [facility] [address] [place],  [place] [address]  HISTORICAL INFORMATION:   Selected notes from the MEDICAL RECORD NUMBER Referred by Alma Downs, PA for eval of diabetic retinopathy OU; DME OS   CURRENT MEDICATIONS: Current Outpatient Medications (Ophthalmic Drugs)  Medication Sig   RESTASIS 0.05 % ophthalmic emulsion Place 1 drop into both eyes in the morning and at bedtime.   No current facility-administered medications for this visit. (Ophthalmic Drugs)   Current Outpatient Medications (Other)  Medication Sig   ACCU-CHEK AVIVA PLUS test strip TEST 3 TIMES DAILY   Accu-Chek Softclix Lancets lancets CHECK BLOOD SUGAR ONCE DAILY   acetaminophen (TYLENOL) 325 MG tablet Take 2 tablets (650 mg total) by mouth every 6 (six) hours as needed for mild pain (or Fever >/= 101).   aspirin 81 MG EC tablet Take 81 mg by mouth daily.   atorvastatin (LIPITOR) 80 MG tablet Take 1 tablet (80 mg total) by mouth every evening.   ciclopirox (PENLAC) 8 %  solution Apply topically at bedtime. Apply over nail and surrounding skin. Apply daily over previous coat. After seven (7) days, may remove with alcohol and continue cycle.   clobetasol cream (TEMOVATE) 0.05 % Apply 1 application topically 2 (two) times daily.   clopidogrel (PLAVIX) 75 MG tablet TAKE 1 TABLET BY MOUTH EVERY DAY   doxycycline (VIBRA-TABS) 100 MG tablet Take 1 tablet (100 mg total) by mouth 2 (two) times daily.   empagliflozin (JARDIANCE) 25 MG TABS tablet TAKE 1 TABLET(25 MG) BY MOUTH AT BEDTIME   gabapentin (NEURONTIN) 300 MG capsule TAKE 1 CAPSULE(300 MG) BY MOUTH AT BEDTIME   glipiZIDE (GLUCOTROL) 10 MG tablet TAKE 2 TABLETS(20 MG) BY MOUTH TWICE DAILY   JANUMET XR [phone] MG TB24 Take 1 tablet by mouth daily.   metoprolol succinate (TOPROL-XL) 100 MG 24 hr tablet Take 1 tablet (100 mg total) by mouth daily.   Misc. Devices MISC Cpap supplies-tubing mask Dx G47.33 and Z99.89   nitroGLYCERIN (NITROSTAT) 0.4 MG SL tablet PLACE 1 TABLET UNDER THE TONGUE EVERY 5 MINS AS NEEDED FOR CHEST PAIN. MAXIMUM OF 3 DOSES   nystatin cream (MYCOSTATIN) SMARTSIG:Sparingly Topical 2-3 Times Daily PRN   OZEMPIC, 1 MG/DOSE, 4 MG/3ML SOPN Inject 1 mg into the skin once a week.   sacubitril-valsartan (ENTRESTO) 24-26 MG Take 1 tablet by  mouth 2 (two) times daily.   Semaglutide,0.25 or 0.5MG /DOS, (OZEMPIC, 0.25 OR 0.5 MG/DOSE,) 2 MG/1.5ML SOPN Inject 0.25 mg into the skin once a week.   sildenafil (VIAGRA) 100 MG tablet Take 100 mg by mouth daily as needed.   spironolactone (ALDACTONE) 25 MG tablet Take 0.5 tablets (12.5 mg total) by mouth every other day.   triamcinolone ointment (KENALOG) 0.1 % SMARTSIG:sparingly Topical Twice Daily   VENTOLIN HFA 108 (90 Base) MCG/ACT inhaler INHALE 2 PUFFS BY MOUTH EVERY 6 HOURS AS NEEDED FOR WHEEZING OR SHORTNESS OF BREATH   No current facility-administered medications for this visit. (Other)   REVIEW OF SYSTEMS: ROS   Positive for: Endocrine,  Cardiovascular, Eyes, Respiratory Negative for: Constitutional, Gastrointestinal, Neurological, Skin, Genitourinary, Musculoskeletal, HENT, Psychiatric, Allergic/Imm, Heme/Lymph Last edited by Etheleen Mayhew, COT on 07/18/2022  9:25 AM.     ALLERGIES No Known Allergies  PAST MEDICAL HISTORY Past Medical History:  Diagnosis Date   5 years ago    AICD (automatic cardioverter/defibrillator) present    Anxiety    Arthritis    Cataract    CHF (congestive heart failure) ([name])    Coronary artery disease    Diabetes mellitus without complication ([name])    Diabetic retinopathy ([name])    Dysrhythmia    GERD (gastroesophageal reflux disease)    PMH   Hypertension    Hypertensive retinopathy    Peripheral vascular disease ([name])    Peripheral vascular disease ([name])    Pneumonia    Shoulder impingement, right    Sleep apnea    wears cpap   Stomach ulcer    Wears glasses    Past Surgical History:  Procedure Laterality Date   AMPUTATION Left 03/12/2019   Procedure: 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;  Surgeon: [name], MD;  Location: [facility];  Service: Orthopedics;  Laterality: Left;   AMPUTATION Right 10/01/2020   Procedure: RIGHT 2nd TOE AMPUTATION;  Surgeon: [name], DPM;  Location: [facility];  Service: Podiatry;  Laterality: Right;   CARDIAC CATHETERIZATION     CARDIAC DEFIBRILLATOR PLACEMENT     CIRCUMCISION N/A 11/05/2020   Procedure: CIRCUMCISION ADULT;  Surgeon: [name], MD;  Location: [facility];  Service: Urology;  Laterality: N/A;   I & D EXTREMITY Left 07/19/2016   Procedure: IRRIGATION AND DEBRIDEMENT EXTREMITY/OLECRANON(WASHOUT);  Surgeon: Tarry Kos, MD;  [facility];  Service: Orthopedics;  Laterality: Left;   OLECRANON BURSECTOMY Left 07/19/2016   Procedure: LEFT ELBOW OLECRANON BURSECTOMY;  Surgeon: Tarry Kos, MD;  Location: [facility];  Service: Orthopedics;  Laterality: Left;   SHOULDER ARTHROSCOPY Right 05/16/2019   Procedure: Right Shoulder  Arthroscopy;  Surgeon: [name], MD;  Location: [facility] OR;  Service: Orthopedics;  Laterality: Right;   FAMILY HISTORY Family History  Problem Relation Age of Onset   Heart disease Mother    Diabetes Mother    Peripheral vascular disease Mother    SOCIAL HISTORY Social History   Tobacco Use   Smoking status: Former    Packs/day: 1.00    Years: 40.00    Additional pack years: 0.00    Total pack years: 40.00    Types: Cigarettes    Quit date: 03/11/2019    Years since quitting: 3.3   Smokeless tobacco: Never  Vaping Use   Vaping Use: Never used  Substance Use Topics   Alcohol use: Not Currently   Drug use: Not Currently    Types: Oxycodone  OPHTHALMIC EXAM:  Base Eye Exam     Visual Acuity (Snellen - Linear)       Right Left   Dist cc 20/25 20/30 -2   Dist ph cc NI 20/25 -2    Correction: Glasses         Tonometry (Tonopen, 9:31 AM)       Right Left   Pressure 15 14         Pupils       Pupils Dark Light Shape React APD   Right PERRL 3 2 Round Brisk None   Left PERRL 3 2 Round Brisk None         Visual Fields       Left Right    Full Full         Extraocular Movement       Right Left    Full, Ortho Full, Ortho         Neuro/Psych     Oriented x3: Yes   Mood/Affect: Normal         Dilation     Both eyes: 2.5% Phenylephrine @ 9:31 AM           Slit Lamp and Fundus Exam     Slit Lamp Exam       Right Left   Lids/Lashes Dermatochalasis - upper lid Dermatochalasis - upper lid   Conjunctiva/Sclera nasal pingeucula nasal pingeucula   Cornea arcus, tear film debris, 1+ PEE arcus, tear film debris, 1+PEE, old subepithelial scars inferior paracentral with haze   Anterior Chamber Deep and quiet Deep and quiet   Iris Round and dilated, No NVI Round and dilated, No NVI   Lens 2-3+ Nuclear sclerosis with +brunescence, 2-3+ Cortical cataract 2-3+ Nuclear sclerosis with +brunescence, 2-3+ Cortical cataract   Anterior  Vitreous Vitreous syneresis Vitreous syneresis         Fundus Exam       Right Left   Disc Pink and Sharp, Compact Pink and Sharp, Compact   C/D Ratio 0.1 0.2   Macula Flat, good foveal reflex, +MA / DBH greatest IT macula, cystic changes temporal macula Flat, good foveal reflex, +MA/DBH, scattered cystic changes -- improved   Vessels attenuated, Tortuous attenuated, Tortuous   Periphery Attached; scattered DBH greatest posteriorly Attached; scattered MA/DBH           Refraction     Wearing Rx       Sphere Cylinder Axis Add   Right +1.25 +1.00 137 +2.50   Left +1.00 +0.50 030 +2.50    Type: PAL           IMAGING AND PROCEDURES  Imaging and Procedures for 07/18/2022  OCT, Retina - OU - Both Eyes       Right Eye Quality was good. Central Foveal Thickness: 249. Progression has been stable. Findings include normal foveal contour, no SRF, intraretinal fluid (interval improvement in IRF/IRHM temporal mac, mild interval increase in IRF superior mac).   Left Eye Quality was good. Central Foveal Thickness: 235. Progression has been stable. Findings include normal foveal contour, no IRF, no SRF, intraretinal hyper-reflective material (stable improvement in central IRF/cystic changes, trace persistent, scattered cystic changes -- non central).   Notes *Images captured and stored on drive  Diagnosis / Impression:  OD: interval improvement in IRF/IRHM temporal mac, mild interval increase in IRF superior mac OS: stable improvement in central IRF/cystic changes, trace persistent, scattered cystic changes -- non central  Clinical management:  See below  Abbreviations: NFP - Normal foveal profile. CME - cystoid macular edema. PED - pigment epithelial detachment. IRF - intraretinal fluid. SRF - subretinal fluid. EZ - ellipsoid zone. ERM - epiretinal membrane. ORA - outer retinal atrophy. ORT - outer retinal tubulation. SRHM - subretinal hyper-reflective material. IRHM -  intraretinal hyper-reflective material            ASSESSMENT/PLAN:    ICD-10-CM   1. Moderate nonproliferative diabetic retinopathy of both eyes with macular edema associated with type 2 diabetes mellitus ([name])  E11.3313 OCT, Retina - OU - Both Eyes    2. Long term (current) use of oral hypoglycemic drugs  Z79.84     3. Long-term (current) use of injectable non-insulin antidiabetic drugs  Z79.85     4. Essential hypertension  I10     5. Hypertensive retinopathy of both eyes  H35.033     6. Combined forms of age-related cataract of both eyes  H25.813      1-3. Moderate nonproliferative diabetic retinopathy w/ DME OU - s/p IVA OS #1 (07.25.23), #2 (08.22.23), #3 (09.19.23), #4 (10.17.23), #5 (11.28.23), #6 (01.16.24)  - A1c 8.1 09/2021 per pt report, 8.8 on 02.15.23 in Care Everywhere - stressed to the patient getting the A1C under 7 could help improve vision. - pt working with Endodrinologist and blood glucose improving  - exam shows scattered MA OU - FA (06.21.22) shows late leaking MA, no NV OU - OCT OD: interval improvement in IRF/IRHM temporal mac, mild interval increase in IRF superior mac; OS: stable improvement in central IRF/cystic changes, trace persistent, scattered cystic changes -- non central - BCVA OD 20/25 -- decreased from 20/20, OS 20/25 -- improved from 20/30 - recommend holding injection today with follow up in 3 months -- pt in agreement - Avastin informed consent obtained and signed 07.25.23 - f/u 3 months -- DFE/OCT, possible injection  4,5. Hypertensive retinopathy OU - discussed importance of tight BP control - continue to monitor  6. Mixed Cataract OU - The symptoms of cataract, surgical options, and treatments and risks were discussed with patient. - discussed diagnosis and progression - suspect BCVA declining due to cataract progression - has an appt with Dr. Dione Booze on May 16 for calculations  Ophthalmic Meds Ordered this visit:  No orders of  the defined types were placed in this encounter.    Return in about 3 months (around 10/18/2022) for f/u NPDR OU, DFE, OCT.  There are no Patient Instructions on file for this visit.  Explained the diagnoses, plan, and follow up with the patient and they expressed understanding.  Patient expressed understanding of the importance of proper follow up care.   This document serves as a record of services personally performed by Karie Chimera, MD, PhD. It was created on their behalf by Gerilyn Nestle, COT an ophthalmic technician. The creation of this record is the provider's dictation and/or activities during the visit.    Electronically signed by:  Gerilyn Nestle, COT  04.23.24 1:53 AM  Karie Chimera, M.D., Ph.D. Diseases & Surgery of the Retina and Vitreous [facility]  I have reviewed the above documentation for accuracy and completeness, and I agree with the above. Karie Chimera, M.D., Ph.D. 07/22/22 1:55 AM   Abbreviations: M myopia (nearsighted); A astigmatism; H hyperopia (farsighted); P presbyopia; Mrx spectacle prescription;  CTL contact lenses; OD right eye; OS left eye; OU both eyes  XT exotropia; ET esotropia; PEK punctate epithelial keratitis; PEE punctate  epithelial erosions; DES dry eye syndrome; MGD meibomian gland dysfunction; ATs artificial tears; PFAT's preservative free artificial tears; NSC nuclear sclerotic cataract; PSC posterior subcapsular cataract; ERM epi-retinal membrane; PVD posterior vitreous detachment; RD retinal detachment; DM diabetes mellitus; DR diabetic retinopathy; NPDR non-proliferative diabetic retinopathy; PDR proliferative diabetic retinopathy; CSME clinically significant macular edema; DME diabetic macular edema; dbh dot blot hemorrhages; CWS cotton wool spot; POAG primary open angle glaucoma; C/D cup-to-disc ratio; HVF humphrey visual field; GVF goldmann visual field; OCT optical coherence tomography; IOP intraocular  pressure; BRVO Branch retinal vein occlusion; CRVO central retinal vein occlusion; CRAO central retinal artery occlusion; BRAO branch retinal artery occlusion; RT retinal tear; SB scleral buckle; PPV pars plana vitrectomy; VH Vitreous hemorrhage; PRP panretinal laser photocoagulation; IVK intravitreal kenalog; VMT vitreomacular traction; MH Macular hole;  NVD neovascularization of the disc; NVE neovascularization elsewhere; AREDS age related eye disease study; ARMD age related macular degeneration; POAG primary open angle glaucoma; EBMD epithelial/anterior basement membrane dystrophy; ACIOL anterior chamber intraocular lens; IOL intraocular lens; PCIOL posterior chamber intraocular lens; Phaco/IOL phacoemulsification with intraocular lens placement; PRK photorefractive keratectomy; LASIK laser assisted in situ keratomileusis; HTN hypertension; DM diabetes mellitus; COPD chronic obstructive pulmonary disease

## 2022-07-09 NOTE — Procedures (Signed)
Lumbosacral Transforaminal Epidural Steroid Injection - Sub-Pedicular Approach with Fluoroscopic Guidance  [name]      Date of Birth: [birth year] MRN: [id number] PCP: Pcp, No      Visit Date: 07/03/2022   Universal Protocol:    Date/Time: 07/03/2022  Consent Given By: the patient  Position: PRONE  Additional Comments: Vital signs were monitored before and after the procedure. Patient was prepped and draped in the usual sterile fashion. The correct patient, procedure, and site was verified.   Injection Procedure Details:   Procedure diagnoses: Lumbar radiculopathy [M54.16]    Meds Administered:  Meds ordered this encounter  Medications   methylPREDNISolone acetate (DEPO-MEDROL) injection 80 mg    Laterality: Right  Location/Site: L4 and S1  Needle:5.0 in., 22 ga.  Short bevel or Quincke spinal needle  Needle Placement: Transforaminal  Findings:    -Comments: Excellent flow of contrast along the nerve, nerve root and into the epidural space.  Procedure Details: After squaring off the end-plates to get a true AP view, the C-arm was positioned so that an oblique view of the foramen as noted above was visualized. The target area is just inferior to the "nose of the scotty dog" or sub pedicular. The soft tissues overlying this structure were infiltrated with 2-3 ml. of 1% Lidocaine without Epinephrine.  The spinal needle was inserted toward the target using a "trajectory" view along the fluoroscope beam.  Under AP and lateral visualization, the needle was advanced so it did not puncture dura and was located close the 6 O'Clock position of the pedical in AP tracterory. Biplanar projections were used to confirm position. Aspiration was confirmed to be negative for CSF and/or blood. A 1-2 ml. volume of Isovue-250 was injected and flow of contrast was noted at each level. Radiographs were obtained for documentation purposes.   After attaining the desired flow of  contrast documented above, a 0.5 to 1.0 ml test dose of 0.25% Marcaine was injected into each respective transforaminal space.  The patient was observed for 90 seconds post injection.  After no sensory deficits were reported, and normal lower extremity motor function was noted,   the above injectate was administered so that equal amounts of the injectate were placed at each foramen (level) into the transforaminal epidural space.   Additional Comments:  No complications occurred Dressing: 2 x 2 sterile gauze and Band-Aid    Post-procedure details: Patient was observed during the procedure. Post-procedure instructions were reviewed.  Patient left the clinic in stable condition.

## 2022-07-09 NOTE — Progress Notes (Signed)
Jon Jones - 67 y.o. male MRN [id number]  Date of birth: 01-29-1956  Office Visit Note: Visit Date: 07/03/2022 PCP: Pcp, No Referred by: Juanda Chance, NP  Subjective: Chief Complaint  Patient presents with   Lower Back - Pain   HPI:  [name] is a 67 y.o. male who comes in today for planned repeat Right L4-5 and S1-2  Lumbar Transforaminal epidural steroid injection with fluoroscopic guidance.  The patient has failed conservative care including home exercise, medications, time and activity modification.  This injection will be diagnostic and hopefully therapeutic.  Please see requesting physician notes for further details and justification. Patient received more than 50% pain relief from prior injection.   Referring: Ellin Goodie, FNP and Dr. Vira Browns   ROS Otherwise per HPI.  Assessment & Plan: Visit Diagnoses:    ICD-10-CM   1. Lumbar radiculopathy  M54.16 XR C-ARM NO REPORT    Epidural Steroid injection    methylPREDNISolone acetate (DEPO-MEDROL) injection 80 mg      Plan: No additional findings.   Meds & Orders:  Meds ordered this encounter  Medications   methylPREDNISolone acetate (DEPO-MEDROL) injection 80 mg    Orders Placed This Encounter  Procedures   XR C-ARM NO REPORT   Epidural Steroid injection    Follow-up: Return for visit to requesting provider as needed.   Procedures: No procedures performed  Lumbosacral Transforaminal Epidural Steroid Injection - Sub-Pedicular Approach with Fluoroscopic Guidance  [name]      Date of Birth: 1955-03-31 MRN: [id number] PCP: Pcp, No      Visit Date: 07/03/2022   Universal Protocol:    Date/Time: 07/03/2022  Consent Given By: the patient  Position: PRONE  Additional Comments: Vital signs were monitored before and after the procedure. Patient was prepped and draped in the usual sterile fashion. The correct patient, procedure, and site was verified.   Injection Procedure Details:    Procedure diagnoses: Lumbar radiculopathy [M54.16]    Meds Administered:  Meds ordered this encounter  Medications   methylPREDNISolone acetate (DEPO-MEDROL) injection 80 mg    Laterality: Right  Location/Site: L4 and S1  Needle:5.0 in., 22 ga.  Short bevel or Quincke spinal needle  Needle Placement: Transforaminal  Findings:    -Comments: Excellent flow of contrast along the nerve, nerve root and into the epidural space.  Procedure Details: After squaring off the end-plates to get a true AP view, the C-arm was positioned so that an oblique view of the foramen as noted above was visualized. The target area is just inferior to the "nose of the scotty dog" or sub pedicular. The soft tissues overlying this structure were infiltrated with 2-3 ml. of 1% Lidocaine without Epinephrine.  The spinal needle was inserted toward the target using a "trajectory" view along the fluoroscope beam.  Under AP and lateral visualization, the needle was advanced so it did not puncture dura and was located close the 6 O'Clock position of the pedical in AP tracterory. Biplanar projections were used to confirm position. Aspiration was confirmed to be negative for CSF and/or blood. A 1-2 ml. volume of Isovue-250 was injected and flow of contrast was noted at each level. Radiographs were obtained for documentation purposes.   After attaining the desired flow of contrast documented above, a 0.5 to 1.0 ml test dose of 0.25% Marcaine was injected into each respective transforaminal space.  The patient was observed for 90 seconds post injection.  After no sensory deficits were reported, and  normal lower extremity motor function was noted,   the above injectate was administered so that equal amounts of the injectate were placed at each foramen (level) into the transforaminal epidural space.   Additional Comments:  No complications occurred Dressing: 2 x 2 sterile gauze and Band-Aid    Post-procedure  details: Patient was observed during the procedure. Post-procedure instructions were reviewed.  Patient left the clinic in stable condition.    Clinical History: Ct Myelogram  IMPRESSION:  1. L5-S1: Severe multifactorial spinal stenosis. Advanced bilateral  facet arthropathy with 5 mm of anterolisthesis which increases to 8  mm with standing and flexion. Bulging of the disc. 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 Marked constriction of the thecal sac  due to these processes in combination with abundant epidural fat.  Severe bilateral foraminal stenosis could compress either or both  exiting L5 nerves.  2. L4-5: Moderate multifactorial spinal stenosis.  3. T12-L1: Right foraminal encroachment by osteophyte could possibly  affect the right T12 nerve.    Aortic Atherosclerosis (ICD10-I70.0).      Electronically Signed    By: Paulina Fusi M.D.    On: 06/29/2020 12:39     Objective:  VS:  HT:5\' 6"  (167.6 cm)   WT:227 lb (103 kg)  BMI:36.66    BP:114/75  HR:83bpm  TEMP: ( )  RESP:  Physical Exam Vitals and nursing note reviewed.  Constitutional:      General: He is not in acute distress.    Appearance: Normal appearance. He is not ill-appearing.  HENT:     Head: Normocephalic and atraumatic.     Right Ear: External ear normal.     Left Ear: External ear normal.     Nose: No congestion.  Eyes:     Extraocular Movements: Extraocular movements intact.  Cardiovascular:     Rate and Rhythm: Normal rate.     Pulses: Normal pulses.  Pulmonary:     Effort: Pulmonary effort is normal. No respiratory distress.  Abdominal:     General: There is no distension.     Palpations: Abdomen is soft.  Musculoskeletal:        General: No tenderness or signs of injury.     Cervical back: Neck supple.     Right lower leg: No edema.     Left lower leg: No edema.     Comments: Patient has good distal  strength without clonus.  Skin:    Findings: No erythema or rash.  Neurological:     General: No focal deficit present.     Mental Status: He is alert and oriented to person, place, and time.     Sensory: No sensory deficit.     Motor: No weakness or abnormal muscle tone.     Coordination: Coordination normal.  Psychiatric:        Mood and Affect: Mood normal.        Behavior: Behavior normal.      Imaging: No results found.

## 2022-07-10 ENCOUNTER — Ambulatory Visit (INDEPENDENT_AMBULATORY_CARE_PROVIDER_SITE_OTHER): Payer: 59

## 2022-07-10 ENCOUNTER — Ambulatory Visit (INDEPENDENT_AMBULATORY_CARE_PROVIDER_SITE_OTHER): Payer: 59 | Admitting: Podiatry

## 2022-07-10 DIAGNOSIS — S99922A Unspecified injury of left foot, initial encounter: Secondary | ICD-10-CM

## 2022-07-10 MED ORDER — DOXYCYCLINE HYCLATE 100 MG PO TABS: 100.0000 mg | ORAL_TABLET | Freq: Two times a day (BID) | ORAL | 0 refills | Status: DC

## 2022-07-11 ENCOUNTER — Other Ambulatory Visit: Payer: Self-pay | Admitting: Podiatry

## 2022-07-11 DIAGNOSIS — S99922A Unspecified injury of left foot, initial encounter: Secondary | ICD-10-CM

## 2022-07-11 NOTE — Progress Notes (Signed)
Subjective:   Patient ID: Jon Jones, male   DOB: 67 y.o.   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  Good digital perfusion      Assessment:  Probability that this is related to trauma and I do not currently see signs of infection cannot rule out     Plan:  H&P x-ray reviewed and at this point I did place him on antibiotic doxycycline advised on soaks bandage usage and if any changes were to occur he is to reappoint immediately but it should heal uneventfully.  X-rays indicate no signs of osteolysis or bony pathology associated with condition

## 2022-07-13 DIAGNOSIS — H35033 Hypertensive retinopathy, bilateral: Secondary | ICD-10-CM | POA: Diagnosis not present

## 2022-07-13 DIAGNOSIS — H179 Unspecified corneal scar and opacity: Secondary | ICD-10-CM | POA: Diagnosis not present

## 2022-07-13 DIAGNOSIS — E113313 Type 2 diabetes mellitus with moderate nonproliferative diabetic retinopathy with macular edema, bilateral: Secondary | ICD-10-CM | POA: Diagnosis not present

## 2022-07-13 DIAGNOSIS — H2513 Age-related nuclear cataract, bilateral: Secondary | ICD-10-CM | POA: Diagnosis not present

## 2022-07-18 ENCOUNTER — Ambulatory Visit (INDEPENDENT_AMBULATORY_CARE_PROVIDER_SITE_OTHER): Payer: 59 | Admitting: Ophthalmology

## 2022-07-18 ENCOUNTER — Encounter (INDEPENDENT_AMBULATORY_CARE_PROVIDER_SITE_OTHER): Payer: Self-pay | Admitting: Ophthalmology

## 2022-07-18 DIAGNOSIS — H35033 Hypertensive retinopathy, bilateral: Secondary | ICD-10-CM

## 2022-07-18 DIAGNOSIS — E113313 Type 2 diabetes mellitus with moderate nonproliferative diabetic retinopathy with macular edema, bilateral: Secondary | ICD-10-CM | POA: Diagnosis not present

## 2022-07-18 DIAGNOSIS — I10 Essential (primary) hypertension: Secondary | ICD-10-CM | POA: Diagnosis not present

## 2022-07-18 DIAGNOSIS — H25813 Combined forms of age-related cataract, bilateral: Secondary | ICD-10-CM

## 2022-07-18 DIAGNOSIS — Z7985 Long-term (current) use of injectable non-insulin antidiabetic drugs: Secondary | ICD-10-CM

## 2022-07-18 DIAGNOSIS — Z7984 Long term (current) use of oral hypoglycemic drugs: Secondary | ICD-10-CM

## 2022-07-18 NOTE — Progress Notes (Signed)
Remote ICD transmission.   

## 2022-07-22 ENCOUNTER — Encounter (INDEPENDENT_AMBULATORY_CARE_PROVIDER_SITE_OTHER): Payer: Self-pay | Admitting: Ophthalmology

## 2022-07-27 ENCOUNTER — Other Ambulatory Visit: Payer: Self-pay | Admitting: Physical Medicine and Rehabilitation

## 2022-07-27 ENCOUNTER — Telehealth: Payer: Self-pay

## 2022-07-27 DIAGNOSIS — M48062 Spinal stenosis, lumbar region with neurogenic claudication: Secondary | ICD-10-CM

## 2022-07-27 DIAGNOSIS — H16223 Keratoconjunctivitis sicca, not specified as Sjogren's, bilateral: Secondary | ICD-10-CM | POA: Diagnosis not present

## 2022-07-27 DIAGNOSIS — M5416 Radiculopathy, lumbar region: Secondary | ICD-10-CM

## 2022-07-27 NOTE — Telephone Encounter (Signed)
Patient called triage phone stating he has not received any relief from injection.  States right side is very painful. Wanting to know what else can be done. [phone]

## 2022-08-04 DIAGNOSIS — E119 Type 2 diabetes mellitus without complications: Secondary | ICD-10-CM | POA: Diagnosis not present

## 2022-08-04 DIAGNOSIS — L209 Atopic dermatitis, unspecified: Secondary | ICD-10-CM | POA: Diagnosis not present

## 2022-08-10 ENCOUNTER — Other Ambulatory Visit: Payer: Self-pay

## 2022-08-10 ENCOUNTER — Ambulatory Visit (INDEPENDENT_AMBULATORY_CARE_PROVIDER_SITE_OTHER): Payer: 59 | Admitting: Physical Medicine and Rehabilitation

## 2022-08-10 ENCOUNTER — Ambulatory Visit (INDEPENDENT_AMBULATORY_CARE_PROVIDER_SITE_OTHER): Payer: 59 | Admitting: Podiatry

## 2022-08-10 ENCOUNTER — Encounter: Payer: Self-pay | Admitting: Podiatry

## 2022-08-10 VITALS — BP 113/66 | HR 93

## 2022-08-10 DIAGNOSIS — M48062 Spinal stenosis, lumbar region with neurogenic claudication: Secondary | ICD-10-CM

## 2022-08-10 DIAGNOSIS — M79675 Pain in left toe(s): Secondary | ICD-10-CM | POA: Diagnosis not present

## 2022-08-10 DIAGNOSIS — Z89421 Acquired absence of other right toe(s): Secondary | ICD-10-CM

## 2022-08-10 DIAGNOSIS — E1159 Type 2 diabetes mellitus with other circulatory complications: Secondary | ICD-10-CM

## 2022-08-10 DIAGNOSIS — M4316 Spondylolisthesis, lumbar region: Secondary | ICD-10-CM

## 2022-08-10 DIAGNOSIS — M79674 Pain in right toe(s): Secondary | ICD-10-CM

## 2022-08-10 DIAGNOSIS — M5116 Intervertebral disc disorders with radiculopathy, lumbar region: Secondary | ICD-10-CM | POA: Diagnosis not present

## 2022-08-10 DIAGNOSIS — B351 Tinea unguium: Secondary | ICD-10-CM | POA: Diagnosis not present

## 2022-08-10 DIAGNOSIS — M5416 Radiculopathy, lumbar region: Secondary | ICD-10-CM

## 2022-08-10 MED ORDER — METHYLPREDNISOLONE ACETATE 80 MG/ML IJ SUSP
80.0000 mg | Freq: Once | INTRAMUSCULAR | Status: AC
Start: 2022-08-10 — End: 2022-08-10
  Administered 2022-08-10: 80 mg

## 2022-08-10 NOTE — Progress Notes (Signed)
This patient returns to my office for at risk foot care.  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  This patient presents for at risk foot care today.  General Appearance  Alert, conversant and in no acute stress.  Vascular  Dorsalis pedis and posterior tibial  pulses are weakly  palpable  bilaterally.  Capillary return is within normal limits  bilaterally. Temperature is within normal limits  bilaterally.  Neurologic  Senn-Weinstein monofilament wire test diminished  bilaterally. Muscle power within normal limits bilaterally.  Nails Thick disfigured discolored nails with subungual debris  from  1-4  toes left. and 1,3-5 toes right. . No evidence of bacterial infection or drainage bilaterally.  Orthopedic  No limitations of motion  feet .  No crepitus or effusions noted.  No bony pathology or digital deformities noted. Hallux valgus.  Hammer toes  B/L.  Skin  normotropic skin with no porokeratosis noted bilaterally.  No signs of infections or ulcers noted.     Onychomycosis  Pain in right toes  Pain in left toes  Consent was obtained for treatment procedures.   Mechanical debridement of nails 1-4 left and 1,3-5 right performed with a nail nipper.  Filed with dremel without incident. Patient qualifies for diabetic shoes due to DPN and HAV  B/L. Patient given paperwork for MD to sign.  His shoes are here.   Return office visit   3 months                   Told patient to return for periodic foot care and evaluation due to potential at risk complications.   Helane Gunther DPM

## 2022-08-10 NOTE — Progress Notes (Signed)
Functional Pain Scale - descriptive words and definitions  Distracting (5)    Aware of pain/able to complete some ADL's but limited by pain/sleep is affected and active distractions are only slightly useful. Moderate range order  Average Pain 7   +Driver, -BT- stopped on 1/61/09, -Dye Allergies.  Lower back pain on right side

## 2022-08-10 NOTE — Patient Instructions (Signed)

## 2022-08-11 NOTE — Progress Notes (Signed)
Jon Jones - 67 y.o. male MRN [id number]  Date of birth: Sep 14, 1955  Office Visit Note: Visit Date: 08/10/2022 PCP: Pcp, No Referred by: Juanda Chance, NP  Subjective: Chief Complaint  Patient presents with   Lower Back - Pain   HPI:  Jon Jones is a 67 y.o. male who comes in today for evaluation and management at the request of Ellin Goodie, FNP, Dr. Aldean Baker, and Barnie Del, FNP for severe worsening over the last several months of right low back and buttock and hip pain down the posterior lateral leg to the calf.  He denies any left-sided symptoms.  His history can be reviewed but he initially was followed by Dr. Aldean Baker and then ultimately by Dr. Vira Browns before he retired.  Dr. Otelia Sergeant felt like the patient likely was a surgical candidate.  MRI in 2022 was not completed to the patient having a pacemaker.  He has since that time had an MRI of his foot so do not know if this is a conditional MRI issue.  Nonetheless he did have CT myelogram which was reviewed again below.  There was stenosis at L4-5 as well as listhesis of L5 on S1 with severe foraminal narrowing bilaterally.  Initially 3 Dr. Otelia Sergeant we completed S1 transforaminal injections into the symptomatic nature of where he was hurting and these did seem to help.  We saw him in April and completed a repeat of an L4 and S1 transforaminal injection at the level of stenosis and based on his symptoms and this has helped in the past but did not help on this occasion.  He has real severe symptoms with 7 out of 10 pain and on the functional pain scale 5 out of 10 where it does affect his daily living quite significantly.  He does have some paresthesias but no focal weakness.  He has trouble with walking and moving.  Symptoms are better at rest.  He has tried and failed all manner of medication treatment and has had therapy in the past but not recently.  Does try to stay active.  Today his symptoms are more classic in an L5 distribution  on the right.  Left side is not really hurting him in some time.  No focal weakness or any red flag complaints.  Patient is on anticoagulation and we did have him stop that prior to this visit depending on what we decided from an intervention standpoint.  His case is complicated by coronary artery disease with diabetes and on anticoagulation.   I spent more than 30 minutes speaking face-to-face with the patient with 50% of the time in counseling and discussing coordination of care.    Review of Systems  Musculoskeletal:  Positive for back pain and joint pain.  Neurological:  Positive for tingling.  All other systems reviewed and are negative.  Otherwise per HPI.  Assessment & Plan: Visit Diagnoses:    ICD-10-CM   1. Lumbar radiculopathy  M54.16 XR C-ARM NO REPORT    Epidural Steroid injection    methylPREDNISolone acetate (DEPO-MEDROL) injection 80 mg    2. Radiculopathy due to lumbar intervertebral disc disorder  M51.16     3. Spinal stenosis of lumbar region with neurogenic claudication  M48.062     4. Spondylolisthesis of lumbar region  M43.16       Plan: Findings:  Chronic recalcitrant history of back pain with radicular pain right more than left but history of Bilateral.  Significant lumbar spine issues noted  on CT myelogram in 2022.  Severe stenosis with small listhesis could be dynamic as shown on the CT scan of 3 mm movement.  Patient likely is a surgical candidate but does not really want to consider surgery if he does not have to.  No focal weakness or red flag symptoms.  Prior injections were beneficial over the years but the last injection just was not very beneficial.  Review of those images show pretty clear good placement of medication but no relief.  Symptoms are really more classic L5 distribution today we are going to complete a diagnostic injection on the right at L5.  Depending on this relief would have him repeat this on occasion if it was beneficial versus have him  follow-up with Dr. Willia Craze for evaluation of his lumbar spine from a surgical standpoint.    Meds & Orders:  Meds ordered this encounter  Medications   methylPREDNISolone acetate (DEPO-MEDROL) injection 80 mg    Orders Placed This Encounter  Procedures   XR C-ARM NO REPORT   Epidural Steroid injection    Follow-up: Return if symptoms worsen or fail to improve.   Procedures: No procedures performed  Lumbosacral Transforaminal Epidural Steroid Injection - Sub-Pedicular Approach with Fluoroscopic Guidance  [name]      Date of Birth: 01-Apr-1955 MRN: [id number] PCP: Pcp, No      Visit Date: 08/10/2022   Universal Protocol:    Date/Time: 08/10/2022  Consent Given By: the patient  Position: PRONE  Additional Comments: Vital signs were monitored before and after the procedure. Patient was prepped and draped in the usual sterile fashion. The correct patient, procedure, and site was verified.   Injection Procedure Details:   Procedure diagnoses: Lumbar radiculopathy [M54.16]    Meds Administered:  Meds ordered this encounter  Medications   methylPREDNISolone acetate (DEPO-MEDROL) injection 80 mg    Laterality: Right  Location/Site: L5  Needle:5.0 in., 22 ga.  Short bevel or Quincke spinal needle  Needle Placement: Transforaminal  Findings:    -Comments: Excellent flow of contrast along the nerve, nerve root and into the epidural space.  Procedure Details: After squaring off the end-plates to get a true AP view, the C-arm was positioned so that an oblique view of the foramen as noted above was visualized. The target area is just inferior to the "nose of the scotty dog" or sub pedicular. The soft tissues overlying this structure were infiltrated with 2-3 ml. of 1% Lidocaine without Epinephrine.  The spinal needle was inserted toward the target using a "trajectory" view along the fluoroscope beam.  Under AP and lateral visualization, the needle was  advanced so it did not puncture dura and was located close the 6 O'Clock position of the pedical in AP tracterory. Biplanar projections were used to confirm position. Aspiration was confirmed to be negative for CSF and/or blood. A 1-2 ml. volume of Isovue-250 was injected and flow of contrast was noted at each level. Radiographs were obtained for documentation purposes.   After attaining the desired flow of contrast documented above, a 0.5 to 1.0 ml test dose of 0.25% Marcaine was injected into each respective transforaminal space.  The patient was observed for 90 seconds post injection.  After no sensory deficits were reported, and normal lower extremity motor function was noted,   the above injectate was administered so that equal amounts of the injectate were placed at each foramen (level) into the transforaminal epidural space.   Additional Comments:  No complications occurred  Dressing: 2 x 2 sterile gauze and Band-Aid    Post-procedure details: Patient was observed during the procedure. Post-procedure instructions were reviewed.  Patient left the clinic in stable condition.    Clinical History: Ct Myelogram  IMPRESSION:  1. L5-S1: Severe multifactorial spinal stenosis. Advanced bilateral  facet arthropathy with 5 mm of anterolisthesis which increases to 8  mm with standing and flexion. Bulging of the disc. 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 Marked constriction of the thecal sac  due to these processes in combination with abundant epidural fat.  Severe bilateral foraminal stenosis could compress either or both  exiting L5 nerves.  2. L4-5: Moderate multifactorial spinal stenosis.  3. T12-L1: Right foraminal encroachment by osteophyte could possibly  affect the right T12 nerve.    Aortic Atherosclerosis (ICD10-I70.0).      Electronically Signed    By: Paulina Fusi M.D.    On: 06/29/2020 12:39      Objective:  VS:  HT:    WT:   BMI:     BP:113/66  HR:93bpm  TEMP: ( )  RESP:  Physical Exam Vitals and nursing note reviewed.  Constitutional:      General: He is not in acute distress.    Appearance: Normal appearance. He is well-developed.  HENT:     Head: Normocephalic and atraumatic.  Eyes:     Conjunctiva/sclera: Conjunctivae normal.     Pupils: Pupils are equal, round, and reactive to light.  Cardiovascular:     Rate and Rhythm: Normal rate.     Pulses: Normal pulses.     Heart sounds: Normal heart sounds.  Pulmonary:     Effort: Pulmonary effort is normal. No respiratory distress.  Musculoskeletal:        General: Tenderness present.     Cervical back: Normal range of motion and neck supple. No rigidity.     Right lower leg: No edema.     Left lower leg: No edema.     Comments: Patient states with forward flexed lumbar spine.  He does have back pain with extension and facet loading.  No palpable trigger points.  No pain over the greater trochanters.  He has no pain with hip rotation.  He has good strength in the lower extremities bilaterally without any deficits bilaterally.  Negative clonus bilaterally.  Tightness in the right hamstring versus left.  Equivocal slump test.  Skin:    General: Skin is warm and dry.     Findings: No erythema or rash.  Neurological:     General: No focal deficit present.     Mental Status: He is alert and oriented to person, place, and time.     Cranial Nerves: No cranial nerve deficit.     Sensory: No sensory deficit.     Motor: No weakness.     Coordination: Coordination normal.     Gait: Gait abnormal.  Psychiatric:        Mood and Affect: Mood normal.        Behavior: Behavior normal.      Imaging: XR C-ARM NO REPORT  Result Date: 08/10/2022 Please see Notes tab for imaging impression.

## 2022-08-11 NOTE — Procedures (Signed)
Lumbosacral Transforaminal Epidural Steroid Injection - Sub-Pedicular Approach with Fluoroscopic Guidance  [name]      Date of Birth: December 25, 1955 MRN: [id number] PCP: Pcp, No      Visit Date: 08/10/2022   Universal Protocol:    Date/Time: 08/10/2022  Consent Given By: the patient  Position: PRONE  Additional Comments: Vital signs were monitored before and after the procedure. Patient was prepped and draped in the usual sterile fashion. The correct patient, procedure, and site was verified.   Injection Procedure Details:   Procedure diagnoses: Lumbar radiculopathy [M54.16]    Meds Administered:  Meds ordered this encounter  Medications   methylPREDNISolone acetate (DEPO-MEDROL) injection 80 mg    Laterality: Right  Location/Site: L5  Needle:5.0 in., 22 ga.  Short bevel or Quincke spinal needle  Needle Placement: Transforaminal  Findings:    -Comments: Excellent flow of contrast along the nerve, nerve root and into the epidural space.  Procedure Details: After squaring off the end-plates to get a true AP view, the C-arm was positioned so that an oblique view of the foramen as noted above was visualized. The target area is just inferior to the "nose of the scotty dog" or sub pedicular. The soft tissues overlying this structure were infiltrated with 2-3 ml. of 1% Lidocaine without Epinephrine.  The spinal needle was inserted toward the target using a "trajectory" view along the fluoroscope beam.  Under AP and lateral visualization, the needle was advanced so it did not puncture dura and was located close the 6 O'Clock position of the pedical in AP tracterory. Biplanar projections were used to confirm position. Aspiration was confirmed to be negative for CSF and/or blood. A 1-2 ml. volume of Isovue-250 was injected and flow of contrast was noted at each level. Radiographs were obtained for documentation purposes.   After attaining the desired flow of contrast  documented above, a 0.5 to 1.0 ml test dose of 0.25% Marcaine was injected into each respective transforaminal space.  The patient was observed for 90 seconds post injection.  After no sensory deficits were reported, and normal lower extremity motor function was noted,   the above injectate was administered so that equal amounts of the injectate were placed at each foramen (level) into the transforaminal epidural space.   Additional Comments:  No complications occurred Dressing: 2 x 2 sterile gauze and Band-Aid    Post-procedure details: Patient was observed during the procedure. Post-procedure instructions were reviewed.  Patient left the clinic in stable condition.

## 2022-08-14 DIAGNOSIS — E1140 Type 2 diabetes mellitus with diabetic neuropathy, unspecified: Secondary | ICD-10-CM | POA: Diagnosis not present

## 2022-08-14 DIAGNOSIS — E782 Mixed hyperlipidemia: Secondary | ICD-10-CM | POA: Diagnosis not present

## 2022-08-14 DIAGNOSIS — B351 Tinea unguium: Secondary | ICD-10-CM | POA: Diagnosis not present

## 2022-08-14 DIAGNOSIS — Z1211 Encounter for screening for malignant neoplasm of colon: Secondary | ICD-10-CM | POA: Diagnosis not present

## 2022-08-14 DIAGNOSIS — I10 Essential (primary) hypertension: Secondary | ICD-10-CM | POA: Diagnosis not present

## 2022-08-14 DIAGNOSIS — I2510 Atherosclerotic heart disease of native coronary artery without angina pectoris: Secondary | ICD-10-CM | POA: Diagnosis not present

## 2022-08-14 DIAGNOSIS — R269 Unspecified abnormalities of gait and mobility: Secondary | ICD-10-CM | POA: Diagnosis not present

## 2022-08-14 DIAGNOSIS — I509 Heart failure, unspecified: Secondary | ICD-10-CM | POA: Diagnosis not present

## 2022-08-14 DIAGNOSIS — E1165 Type 2 diabetes mellitus with hyperglycemia: Secondary | ICD-10-CM | POA: Diagnosis not present

## 2022-08-14 DIAGNOSIS — M109 Gout, unspecified: Secondary | ICD-10-CM | POA: Diagnosis not present

## 2022-08-14 DIAGNOSIS — B353 Tinea pedis: Secondary | ICD-10-CM | POA: Diagnosis not present

## 2022-08-14 DIAGNOSIS — G4733 Obstructive sleep apnea (adult) (pediatric): Secondary | ICD-10-CM | POA: Diagnosis not present

## 2022-08-16 DIAGNOSIS — H2513 Age-related nuclear cataract, bilateral: Secondary | ICD-10-CM | POA: Diagnosis not present

## 2022-08-23 DIAGNOSIS — I509 Heart failure, unspecified: Secondary | ICD-10-CM | POA: Diagnosis not present

## 2022-08-23 DIAGNOSIS — E782 Mixed hyperlipidemia: Secondary | ICD-10-CM | POA: Diagnosis not present

## 2022-08-23 DIAGNOSIS — M109 Gout, unspecified: Secondary | ICD-10-CM | POA: Diagnosis not present

## 2022-08-24 ENCOUNTER — Ambulatory Visit: Payer: 59 | Admitting: Podiatry

## 2022-08-24 DIAGNOSIS — Z89421 Acquired absence of other right toe(s): Secondary | ICD-10-CM

## 2022-08-24 DIAGNOSIS — L97512 Non-pressure chronic ulcer of other part of right foot with fat layer exposed: Secondary | ICD-10-CM

## 2022-08-24 DIAGNOSIS — E1159 Type 2 diabetes mellitus with other circulatory complications: Secondary | ICD-10-CM

## 2022-08-24 NOTE — Progress Notes (Signed)
Patient presents today to pick up diabetic insoles.  Patient was dispensed  3 pairs of foam casted diabetic insoles. Fit was satisfactory. Instructions for break-in and wear was reviewed and a copy was given to the patient.   Re-appointment for regularly scheduled diabetic foot care visits or if they should experience any trouble with the shoes or insoles.

## 2022-08-28 DIAGNOSIS — E119 Type 2 diabetes mellitus without complications: Secondary | ICD-10-CM | POA: Diagnosis not present

## 2022-08-28 DIAGNOSIS — L209 Atopic dermatitis, unspecified: Secondary | ICD-10-CM | POA: Diagnosis not present

## 2022-08-31 DIAGNOSIS — M109 Gout, unspecified: Secondary | ICD-10-CM | POA: Diagnosis not present

## 2022-08-31 DIAGNOSIS — R5383 Other fatigue: Secondary | ICD-10-CM | POA: Diagnosis not present

## 2022-08-31 DIAGNOSIS — Z79899 Other long term (current) drug therapy: Secondary | ICD-10-CM | POA: Diagnosis not present

## 2022-08-31 DIAGNOSIS — E1165 Type 2 diabetes mellitus with hyperglycemia: Secondary | ICD-10-CM | POA: Diagnosis not present

## 2022-08-31 DIAGNOSIS — E782 Mixed hyperlipidemia: Secondary | ICD-10-CM | POA: Diagnosis not present

## 2022-08-31 DIAGNOSIS — I10 Essential (primary) hypertension: Secondary | ICD-10-CM | POA: Diagnosis not present

## 2022-08-31 DIAGNOSIS — D649 Anemia, unspecified: Secondary | ICD-10-CM | POA: Diagnosis not present

## 2022-08-31 DIAGNOSIS — I5042 Chronic combined systolic (congestive) and diastolic (congestive) heart failure: Secondary | ICD-10-CM | POA: Diagnosis not present

## 2022-08-31 DIAGNOSIS — E559 Vitamin D deficiency, unspecified: Secondary | ICD-10-CM | POA: Diagnosis not present

## 2022-09-05 ENCOUNTER — Ambulatory Visit (INDEPENDENT_AMBULATORY_CARE_PROVIDER_SITE_OTHER): Payer: 59

## 2022-09-05 ENCOUNTER — Ambulatory Visit (INDEPENDENT_AMBULATORY_CARE_PROVIDER_SITE_OTHER): Payer: 59 | Admitting: Podiatry

## 2022-09-05 DIAGNOSIS — L97522 Non-pressure chronic ulcer of other part of left foot with fat layer exposed: Secondary | ICD-10-CM

## 2022-09-05 DIAGNOSIS — I5042 Chronic combined systolic (congestive) and diastolic (congestive) heart failure: Secondary | ICD-10-CM | POA: Diagnosis not present

## 2022-09-05 DIAGNOSIS — L97512 Non-pressure chronic ulcer of other part of right foot with fat layer exposed: Secondary | ICD-10-CM

## 2022-09-05 DIAGNOSIS — S99921A Unspecified injury of right foot, initial encounter: Secondary | ICD-10-CM

## 2022-09-05 MED ORDER — DOXYCYCLINE HYCLATE 100 MG PO TABS: 100.0000 mg | ORAL_TABLET | Freq: Two times a day (BID) | ORAL | 0 refills | Status: AC

## 2022-09-05 NOTE — Progress Notes (Signed)
Subjective:  Patient ID: Jon Jones, male    DOB: [birth year],  MRN: [id number]  Chief Complaint  Patient presents with   Toe Injury    Right 5th toe injury occurred yesterday and left 4th toe occurred 2 days ago.     67 y.o. male presents with concern for wounds present on the fourth toe on bilateral foot.  He says that they are very recent just starting yesterday on the left fourth toe and 2 days ago on the right 5th toe.  He is not sure how this started.  Does have a history of diabetes with neuropathy.  Has prior fifth toe amputations left foot and 2nd toe amp right foot.  He has seen some drainage from these wounds.  Not currently taking any antibiotics  Past Medical History:  Diagnosis Date   5 years ago    AICD (automatic cardioverter/defibrillator) present    Anxiety    Arthritis    Cataract    CHF (congestive heart failure) ([name])    Coronary artery disease    Diabetes mellitus without complication ([name])    Diabetic retinopathy ([name])    Dysrhythmia    GERD (gastroesophageal reflux disease)    PMH   Hypertension    Hypertensive retinopathy    Peripheral vascular disease ([name])    Peripheral vascular disease ([name])    Pneumonia    Shoulder impingement, right    Sleep apnea    wears cpap   Stomach ulcer    Wears glasses     No Known Allergies  ROS: Negative except as per HPI above  Objective:  General: AAO x3, NAD  Dermatological: Ulceration present on the dorsal lateral aspect of the left fourth toe and right 5th toe which is much larger and worse than the left foot.  There is increased erythema of the right 5th toe.  Worse in the right than the left with regards to erythema and edema. Wound measures Right foot 1.5x1.3x0.2. Left foot 0.8x0.5x0.2.   Vascular:  Dorsalis Pedis 2/4 but PT pulses non palpable.   Neruologic: Grossly absent to light touch protective sensation absent  Musculoskeletal: Prior R 2nd toe amp, Left 5th toe amp  Gait: Unassisted,  Nonantalgic.        Radiographs:  Date: 09/05/2022 XR bilateral foot weightbearing AP/Lateral/Oblique   Findings: No obvious evidence of soft tissue emphysema or erosions in the right fifth toe or the left fourth toe however cannot rule out based on these x-rays especially on the right fifth toe. Assessment:   1. Toe injury, right, initial encounter   2. Ulcer of toe, right, with fat layer exposed ([name])   3. Ulcer of toe, left, with fat layer exposed ([name])      Plan:  Patient was evaluated and treated and all questions answered.  # Left fourth toe ulceration of the dorsal lateral aspect  # Right 5th toe ulcer dorsolateral aspect right worse than left.  Concern for under lying osteomyelitis of the right fifth toe  -We discussed the etiology and factors that are a part of the wound healing process.  We also discussed the risk of infection both soft tissue and osteomyelitis from open ulceration.  Discussed the risk of limb loss if this happens or worsens. -Debridement as below. -Dressed with Betadine, DSD. -Continue home dressing changes daily with dry gauze and Betadine -Continue off-loading with surgical shoe and extra depth diabetic shoes with a custom molded multi density insole. -Vascular testing ordered ABI PVR testing of  the bilateral lower extremity given diminished PT pulses and toe ulcers -HgbA1c: Unknown -Last antibiotics: eRx for doxycycline 100 mg twice daily for 2 weeks -Imaging: x-ray reviewed, shows possible erosion or infection. Will order MRI to further evaluate.  Procedure: Excisional Debridement of Wound Rationale: Removal of non-viable soft tissue from the wound to promote healing.  Anesthesia: none Post-Debridement Wound Measurements: Wound measures Right foot 1.5x1.3x0.2. Left foot 0.8x0.5x0.2.  Type of Debridement: Sharp Excisional Tissue Removed: Non-viable soft tissue Depth of Debridement: subcutaneous tissue. Technique: Sharp excisional debridement to  bleeding, viable wound base.  Dressing: Dry, sterile, compression dressing. Disposition: Patient tolerated procedure well.   No follow-ups on file.       No follow-ups on file.          Corinna Gab, DPM [facility] / [facility]

## 2022-09-05 NOTE — Progress Notes (Signed)
Subjective:  Patient ID: Jon Jones, male    DOB: [birth year],  MRN: [id number]  Chief Complaint  Patient presents with   Toe Injury    Right 5th toe injury occurred yesterday and left 4th toe occurred 2 days ago.     67 y.o. male presents with concern for wounds present on the fourth toe on bilateral foot.  He says that they are very recent just starting yesterday on the left fourth toe and 2 days ago on the right 5th toe.  He is not sure how this started.  Does have a history of diabetes with neuropathy.  Has prior fifth toe amputations left foot and 2nd toe amp right foot.  He has seen some drainage from these wounds.  Not currently taking any antibiotics  Past Medical History:  Diagnosis Date   5 years ago    AICD (automatic cardioverter/defibrillator) present    Anxiety    Arthritis    Cataract    CHF (congestive heart failure) ([name])    Coronary artery disease    Diabetes mellitus without complication ([name])    Diabetic retinopathy ([name])    Dysrhythmia    GERD (gastroesophageal reflux disease)    PMH   Hypertension    Hypertensive retinopathy    Peripheral vascular disease ([name])    Peripheral vascular disease ([name])    Pneumonia    Shoulder impingement, right    Sleep apnea    wears cpap   Stomach ulcer    Wears glasses     No Known Allergies  ROS: Negative except as per HPI above  Objective:  General: AAO x3, NAD  Dermatological: Ulceration present on the dorsal lateral aspect of the left fourth toe and right 5th toe which is much larger and worse than the left foot.  There is increased erythema of the right 5th toe.  Worse in the right than the left with regards to erythema and edema. Wound measures Right foot 1.5x1.3x0.2. Left foot 0.8x0.5x0.2.   Vascular:  Dorsalis Pedis 2/4 but PT pulses non palpable.   Neruologic: Grossly absent to light touch protective sensation absent  Musculoskeletal: Prior R 2nd toe amp, Left 5th toe amp  Gait: Unassisted,  Nonantalgic.        Radiographs:  Date: 09/05/2022 XR bilateral foot weightbearing AP/Lateral/Oblique   Findings: No obvious evidence of soft tissue emphysema or erosions in the right fifth toe or the left fourth toe however cannot rule out based on these x-rays especially on the right fifth toe. Assessment:   1. Toe injury, right, initial encounter   2. Ulcer of toe, right, with fat layer exposed ([name])   3. Ulcer of toe, left, with fat layer exposed ([name])      Plan:  Patient was evaluated and treated and all questions answered.  # Left fourth toe ulceration of the dorsal lateral aspect  # Right 5th toe ulcer dorsolateral aspect right worse than left.  Concern for under lying osteomyelitis of the right fifth toe  -We discussed the etiology and factors that are a part of the wound healing process.  We also discussed the risk of infection both soft tissue and osteomyelitis from open ulceration.  Discussed the risk of limb loss if this happens or worsens. -Debridement as below. -Dressed with Betadine, DSD. -Continue home dressing changes daily with dry gauze and Betadine -Continue off-loading with surgical shoe and extra depth diabetic shoes with a custom molded multi density insole. -Vascular testing ordered ABI PVR testing of  the bilateral lower extremity given diminished PT pulses and toe ulcers -HgbA1c: Unknown -Last antibiotics: eRx for doxycycline 100 mg twice daily for 2 weeks -Imaging: x-ray reviewed, shows possible erosion or infection. Will order MRI to further evaluate.  Procedure: Excisional Debridement of Wound Rationale: Removal of non-viable soft tissue from the wound to promote healing.  Anesthesia: none Post-Debridement Wound Measurements: Wound measures Right foot 1.5x1.3x0.2. Left foot 0.8x0.5x0.2.  Type of Debridement: Sharp Excisional Tissue Removed: Non-viable soft tissue Depth of Debridement: subcutaneous tissue. Technique: Sharp excisional debridement to  bleeding, viable wound base.  Dressing: Dry, sterile, compression dressing. Disposition: Patient tolerated procedure well.   Return in about 2 weeks (around 09/19/2022) for f/u bilateral toe ulcers.       Return in about 2 weeks (around 09/19/2022) for f/u bilateral toe ulcers.          Corinna Gab, DPM [facility] / [facility]

## 2022-09-06 LAB — CUP PACEART REMOTE DEVICE CHECK
Battery Remaining Longevity: 44 mo
Battery Voltage: 2.97 V
Brady Statistic AP VP Percent: 0 %
Brady Statistic AP VS Percent: 0.58 %
Brady Statistic AS VP Percent: 0.03 %
Brady Statistic AS VS Percent: 99.38 %
Brady Statistic RA Percent Paced: 0.59 %
Brady Statistic RV Percent Paced: 0.03 %
Date Time Interrogation Session: 20240625043625
HighPow Impedance: 78 Ohm
Implantable Lead Connection Status: 753985
Implantable Lead Connection Status: 753985
Implantable Lead Implant Date: 20180702
Implantable Lead Implant Date: 20180702
Implantable Lead Location: 753859
Implantable Lead Location: 753860
Implantable Lead Model: 5076
Implantable Pulse Generator Implant Date: 20180702
Lead Channel Impedance Value: 380 Ohm
Lead Channel Impedance Value: 380 Ohm
Lead Channel Impedance Value: 456 Ohm
Lead Channel Pacing Threshold Amplitude: 0.5 V
Lead Channel Pacing Threshold Amplitude: 0.625 V
Lead Channel Pacing Threshold Pulse Width: 0.4 ms
Lead Channel Pacing Threshold Pulse Width: 0.4 ms
Lead Channel Sensing Intrinsic Amplitude: 3.125 mV
Lead Channel Sensing Intrinsic Amplitude: 3.125 mV
Lead Channel Sensing Intrinsic Amplitude: 5.25 mV
Lead Channel Sensing Intrinsic Amplitude: 5.25 mV
Lead Channel Setting Pacing Amplitude: 1.5 V
Lead Channel Setting Pacing Amplitude: 2 V
Lead Channel Setting Pacing Pulse Width: 0.4 ms
Lead Channel Setting Sensing Sensitivity: 0.3 mV
Zone Setting Status: 755011
Zone Setting Status: 755011

## 2022-09-07 ENCOUNTER — Telehealth: Payer: Self-pay | Admitting: Podiatry

## 2022-09-07 ENCOUNTER — Telehealth: Payer: Self-pay | Admitting: *Deleted

## 2022-09-07 NOTE — Telephone Encounter (Signed)
Pt wife called in informing us that pt can go to get the mri due to him having a defibrillator and needs to go to the hospital for services.  Please advise

## 2022-09-07 NOTE — Telephone Encounter (Signed)
Patient's MRI has been changed to [facility] for implanted devices,they will contact the patient for scheduling. Patient's  wife has been updated that they will contact her for scheduling once they have pre authorized thru insurance.

## 2022-09-07 NOTE — Telephone Encounter (Signed)
Patient's wife is calling for status of MRI order, left message that once all insurance has been approved , they will contact them to schedule, also left [facility] number for her to contact if not heard anything in a few more days.

## 2022-09-08 ENCOUNTER — Ambulatory Visit (HOSPITAL_COMMUNITY)
Admission: RE | Admit: 2022-09-08 | Discharge: 2022-09-08 | Disposition: A | Discharge status: Home / Self Care | Payer: 59 | Source: Ambulatory Visit | Attending: Cardiovascular Disease | Admitting: Cardiovascular Disease

## 2022-09-08 ENCOUNTER — Other Ambulatory Visit (HOSPITAL_COMMUNITY): Payer: Self-pay | Admitting: Podiatry

## 2022-09-08 DIAGNOSIS — I771 Stricture of artery: Secondary | ICD-10-CM | POA: Diagnosis not present

## 2022-09-08 DIAGNOSIS — M79671 Pain in right foot: Secondary | ICD-10-CM | POA: Diagnosis not present

## 2022-09-08 DIAGNOSIS — L97522 Non-pressure chronic ulcer of other part of left foot with fat layer exposed: Secondary | ICD-10-CM | POA: Diagnosis not present

## 2022-09-08 DIAGNOSIS — L97529 Non-pressure chronic ulcer of other part of left foot with unspecified severity: Secondary | ICD-10-CM | POA: Diagnosis not present

## 2022-09-08 DIAGNOSIS — M79672 Pain in left foot: Secondary | ICD-10-CM | POA: Diagnosis not present

## 2022-09-08 DIAGNOSIS — Z9889 Other specified postprocedural states: Secondary | ICD-10-CM | POA: Diagnosis not present

## 2022-09-08 DIAGNOSIS — L988 Other specified disorders of the skin and subcutaneous tissue: Secondary | ICD-10-CM | POA: Diagnosis not present

## 2022-09-08 DIAGNOSIS — L97321 Non-pressure chronic ulcer of left ankle limited to breakdown of skin: Secondary | ICD-10-CM | POA: Insufficient documentation

## 2022-09-08 DIAGNOSIS — L97512 Non-pressure chronic ulcer of other part of right foot with fat layer exposed: Secondary | ICD-10-CM | POA: Diagnosis not present

## 2022-09-08 DIAGNOSIS — N186 End stage renal disease: Secondary | ICD-10-CM | POA: Diagnosis not present

## 2022-09-08 DIAGNOSIS — Z7901 Long term (current) use of anticoagulants: Secondary | ICD-10-CM | POA: Diagnosis not present

## 2022-09-08 DIAGNOSIS — I998 Other disorder of circulatory system: Secondary | ICD-10-CM | POA: Diagnosis not present

## 2022-09-08 DIAGNOSIS — L929 Granulomatous disorder of the skin and subcutaneous tissue, unspecified: Secondary | ICD-10-CM | POA: Diagnosis not present

## 2022-09-08 DIAGNOSIS — Z9862 Peripheral vascular angioplasty status: Secondary | ICD-10-CM | POA: Diagnosis not present

## 2022-09-08 DIAGNOSIS — L84 Corns and callosities: Secondary | ICD-10-CM | POA: Diagnosis not present

## 2022-09-08 DIAGNOSIS — Z992 Dependence on renal dialysis: Secondary | ICD-10-CM | POA: Diagnosis not present

## 2022-09-08 LAB — VAS US ABI WITH/WO TBI: Right ABI: 1.22

## 2022-09-10 LAB — VAS US ABI WITH/WO TBI: Left ABI: 1.22

## 2022-09-13 ENCOUNTER — Ambulatory Visit (INDEPENDENT_AMBULATORY_CARE_PROVIDER_SITE_OTHER): Payer: 59 | Admitting: Family

## 2022-09-13 DIAGNOSIS — L97521 Non-pressure chronic ulcer of other part of left foot limited to breakdown of skin: Secondary | ICD-10-CM

## 2022-09-13 DIAGNOSIS — L97511 Non-pressure chronic ulcer of other part of right foot limited to breakdown of skin: Secondary | ICD-10-CM | POA: Diagnosis not present

## 2022-09-15 ENCOUNTER — Encounter: Payer: Self-pay | Admitting: Family

## 2022-09-15 NOTE — Progress Notes (Signed)
Office Visit Note   [name]           Date of Birth: 21-Apr-1955           MRN: [id number] Visit Date: 09/13/2022              Requested by: No referring provider defined for this encounter. PCP: Pcp, No  Chief Complaint  Patient presents with   Right Shoulder - Follow-up   Right Knee - Follow-up   Left Knee - Follow-up      HPI: The patient is a 67 year old gentleman who presents today for planned Depo-Medrol injection right shoulder bilateral knees.  Last injection April 3 of this year.  These have been providing interval relief of his pain however he reports he has new ulcers to bilateral feet.  He states that he was working in the yard kneeling and noticed at the end of the day he had developed an ulcer to the fifth toe of the right foot as well as the tip of the fourth toe he is currently completing a course of antibiotics and painting these ulcers with Betadine and dry dressings  The patient would like to consider holding off on his injections until ulcers are healed  Assessment & Plan: Visit Diagnoses: No diagnosis found.  Plan: Will hold off on injections.  He will stop using Betadine may use antibacterial ointment and gauze dressing complete the course of antibiotics as prescribed  Follow-Up Instructions: Return in about 4 weeks (around 10/11/2022).   Ortho Exam  Patient is alert, oriented, no adenopathy, well-dressed, normal affect, normal respiratory effort. On examination of the right foot there is no edema or erythema he does have an ulcer over the lateral aspect of his fifth toe which is 1 cm in diameter filled in with granulation there is no drainage or epiboly on examination of the left foot the fourth toe tuft with ulceration this is 4 mm in diameter there is no drainage no erythema no sign of infection  Imaging: No results found. No images are attached to the encounter.  Labs: Lab Results  Component Value Date   HGBA1C 8.7 (H) 10/19/2020    HGBA1C 9.8 (H) 09/08/2020   HGBA1C 11.0 (A) 07/23/2020   ESRSEDRATE 11 03/11/2019   CRP 1.8 (H) 03/11/2019   REPTSTATUS 10/04/2020 FINAL 09/29/2020   GRAMSTAIN  07/19/2016    RARE WBC PRESENT, PREDOMINANTLY MONONUCLEAR NO ORGANISMS SEEN    CULT  09/29/2020    NO GROWTH 5 DAYS Performed at [facility], [address], [place], [place] [address]      Lab Results  Component Value Date   ALBUMIN 3.5 09/29/2020   ALBUMIN 4.2 04/26/2020   ALBUMIN 3.7 03/11/2019    No results found for: "MG" No results found for: "VD25OH"  No results found for: "PREALBUMIN"    Latest Ref Rng & Units 10/19/2020   10:59 AM 10/01/2020    5:08 AM 09/30/2020    4:56 AM  CBC EXTENDED  WBC 4.0 - 10.5 K/uL 8.0  7.4  8.4   RBC 4.22 - 5.81 MIL/uL 4.45  4.32  3.99   Hemoglobin 13.0 - 17.0 g/dL 19.1  47.8  29.5   HCT 39.0 - 52.0 % 42.3  40.1  36.6   Platelets 150 - 400 K/uL 183  202  170      There is no height or weight on file to calculate BMI.  Orders:  No orders of the defined types  were placed in this encounter.  No orders of the defined types were placed in this encounter.    Procedures: No procedures performed  Clinical Data: No additional findings.  ROS:  All other systems negative, except as noted in the HPI. Review of Systems  Objective: Vital Signs: There were no vitals taken for this visit.  Specialty Comments:  Ct Myelogram  IMPRESSION:  1. L5-S1: Severe multifactorial spinal stenosis. Advanced bilateral  facet arthropathy with 5 mm of anterolisthesis which increases to 8  mm with standing and flexion. Bulging of the disc. 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 Marked constriction of the thecal sac  due to these processes in combination with abundant epidural fat.  Severe bilateral foraminal stenosis could compress either or both  exiting L5 nerves.  2. L4-5: Moderate multifactorial  spinal stenosis.  3. T12-L1: Right foraminal encroachment by osteophyte could possibly  affect the right T12 nerve.    Aortic Atherosclerosis (ICD10-I70.0).      Electronically Signed    By: Paulina Fusi M.D.    On: 06/29/2020 12:39  PMFS History: Patient Active Problem List   Diagnosis Date Noted   Diabetic neuropathy ([name]) 10/31/2021   Pain due to onychomycosis of toenails of both feet 01/21/2021   Phimosis of penis 10/06/2020   Cellulitis 09/29/2020   Cellulitis of right toe 09/29/2020   Chronic combined systolic and diastolic heart failure ([name]) 09/15/2020   Onychomycosis of toenail 08/24/2020   Diabetes mellitus ([name]) 07/23/2020   Type 2 diabetes mellitus with hyperglycemia, without long-term current use of insulin ([name]) 07/23/2020   Weight gain 07/23/2020   Type 2 diabetes mellitus with peripheral neuropathy ([name]) 07/23/2020   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 diabetes mellitus ([name]) 05/04/2020   Hypertensive heart disease with heart failure ([name]) 04/26/2020   Type 2 diabetes mellitus with diabetic peripheral angiopathy without gangrene, without long-term current use of insulin ([name]) 04/26/2020   OSA on CPAP 04/26/2020   PAD (peripheral artery disease) ([name]) 04/26/2020   CAD in native artery 04/26/2020   Class 2 severe obesity due to excess calories with serious comorbidity and body mass index (BMI) of 38.0 to 38.9 in adult [facility]) 04/26/2020   Impingement syndrome of right shoulder    Nontraumatic complete tear of right rotator cuff    Type 2 diabetes mellitus with foot ulcer, without long-term current use of insulin ([name])    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   S/P debridement 07/19/2016   Acute gout of left elbow 07/19/2016   Past Medical History:  Diagnosis Date   5 years ago    AICD (automatic cardioverter/defibrillator) present    Anxiety    Arthritis    Cataract    CHF (congestive heart failure) ([name])    Coronary artery disease     Diabetes mellitus without complication ([name])    Diabetic retinopathy ([name])    Dysrhythmia    GERD (gastroesophageal reflux disease)    PMH   Hypertension    Hypertensive retinopathy    Peripheral vascular disease ([name])    Peripheral vascular disease ([name])    Pneumonia    Shoulder impingement, right    Sleep apnea    wears cpap   Stomach ulcer    Wears glasses     Family History  Problem Relation Age of Onset   Heart disease Mother    Diabetes Mother  Peripheral vascular disease Mother     Past Surgical History:  Procedure Laterality Date   AMPUTATION Left 03/12/2019   Procedure: 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;  Surgeon: [name], MD;  Location: [facility] OR;  Service: Orthopedics;  Laterality: Left;   AMPUTATION Right 10/01/2020   Procedure: RIGHT 2nd TOE AMPUTATION;  Surgeon: [name], DPM;  Location: [facility] OR;  Service: Podiatry;  Laterality: Right;   CARDIAC CATHETERIZATION     CARDIAC DEFIBRILLATOR PLACEMENT     CIRCUMCISION N/A 11/05/2020   Procedure: CIRCUMCISION ADULT;  Surgeon: [name], MD;  Location: [facility] ORS;  Service: Urology;  Laterality: N/A;   I & D EXTREMITY Left 07/19/2016   Procedure: IRRIGATION AND DEBRIDEMENT EXTREMITY/OLECRANON(WASHOUT);  Surgeon: Tarry Kos, MD;  Location: [facility] OR;  Service: Orthopedics;  Laterality: Left;   OLECRANON BURSECTOMY Left 07/19/2016   Procedure: LEFT ELBOW OLECRANON BURSECTOMY;  Surgeon: Tarry Kos, MD;  Location: [facility];  Service: Orthopedics;  Laterality: Left;   SHOULDER ARTHROSCOPY Right 05/16/2019   Procedure: Right Shoulder Arthroscopy;  Surgeon: [name], MD;  Location: [facility] OR;  Service: Orthopedics;  Laterality: Right;   Social History   Occupational History   Not on file  Tobacco Use   Smoking status: Former    Packs/day: 1.00    Years: 40.00    Additional pack years: 0.00    Total pack years: 40.00    Types: Cigarettes    Quit date: 03/11/2019    Years since quitting: 3.5   Smokeless  tobacco: Never  Vaping Use   Vaping Use: Never used  Substance and Sexual Activity   Alcohol use: Not Currently   Drug use: Not Currently    Types: Oxycodone   Sexual activity: Not on file

## 2022-09-18 ENCOUNTER — Ambulatory Visit (INDEPENDENT_AMBULATORY_CARE_PROVIDER_SITE_OTHER): Payer: 59 | Admitting: Podiatry

## 2022-09-18 DIAGNOSIS — Z89421 Acquired absence of other right toe(s): Secondary | ICD-10-CM

## 2022-09-18 DIAGNOSIS — L97512 Non-pressure chronic ulcer of other part of right foot with fat layer exposed: Secondary | ICD-10-CM | POA: Diagnosis not present

## 2022-09-18 DIAGNOSIS — E1159 Type 2 diabetes mellitus with other circulatory complications: Secondary | ICD-10-CM

## 2022-09-18 DIAGNOSIS — L97522 Non-pressure chronic ulcer of other part of left foot with fat layer exposed: Secondary | ICD-10-CM

## 2022-09-18 MED ORDER — DOXYCYCLINE HYCLATE 100 MG PO TABS: 100.0000 mg | ORAL_TABLET | Freq: Two times a day (BID) | ORAL | 0 refills | Status: AC

## 2022-09-18 NOTE — Progress Notes (Signed)
Subjective:  Patient ID: Jon Jones, male    DOB: [birth year],  MRN: [id number]  Chief Complaint  Patient presents with   Wound Check    Ulcer check to right 5th toe and left 4th toe. Patient has been changing the dressing and applying triple antibiotics and covering area with a band-aid.     67 y.o. male presents for follow up of bilateral foot wounds. Right 5th toe and Left 4th toe.  Patient reports the wounds been doing well since prior visit.  He had been putting Betadine at first and then stopped using Betadine at the recommendation of another doctor and has been using triple antibiotic ointment on the wounds.  Covering the area with a Band-Aid.  He has an MRI scheduled for the right foot coming up later this month.  States the redness and swelling have decreased in both feet.  Past Medical History:  Diagnosis Date   5 years ago    AICD (automatic cardioverter/defibrillator) present    Anxiety    Arthritis    Cataract    CHF (congestive heart failure) ([name])    Coronary artery disease    Diabetes mellitus without complication ([name])    Diabetic retinopathy ([name])    Dysrhythmia    GERD (gastroesophageal reflux disease)    PMH   Hypertension    Hypertensive retinopathy    Peripheral vascular disease ([name])    Peripheral vascular disease ([name])    Pneumonia    Shoulder impingement, right    Sleep apnea    wears cpap   Stomach ulcer    Wears glasses     No Known Allergies  ROS: Negative except as per HPI above  Objective:  General: AAO x3, NAD  Dermatological: Ulceration present on the dorsal lateral aspect of the left fourth toe and right 5th toe which is much larger and worse than the left foot.  There is increased erythema of the right 5th toe.  Worse in the right than the left with regards to erythema and edema. Wound measures Right foot 1.5x1.3x0.2. Left foot has dry eschar overlying close wound.  See pictures below  Vascular:  Dorsalis Pedis 2/4 but PT pulses non  palpable.   Neruologic: Grossly absent to light touch protective sensation absent  Musculoskeletal: Prior R 2nd toe amp, Left 5th toe amp  Gait: Unassisted, Nonantalgic.           Radiographs:  Date: 09/05/2022 XR bilateral foot weightbearing AP/Lateral/Oblique   Findings: No obvious evidence of soft tissue emphysema or erosions in the right fifth toe or the left fourth toe however cannot rule out based on these x-rays especially on the right fifth toe. Assessment:   1. Ulcer of toe, right, with fat layer exposed ([name])   2. Ulcer of toe, left, with fat layer exposed ([name])   3. History of amputation of lesser toe of right foot ([name])   4. Type 2 diabetes mellitus with other circulatory complication, without long-term current use of insulin ([name])       Plan:  Patient was evaluated and treated and all questions answered.  # Left fourth toe ulceration of the dorsal lateral aspect  # Right 5th toe ulcer dorsolateral aspect right worse than left.  Concern for under lying osteomyelitis of the right fifth toe  -We discussed the etiology and factors that are a part of the wound healing process.  We also discussed the risk of infection both soft tissue and osteomyelitis from open ulceration.  Discussed  the risk of limb loss if this happens or worsens. -Debridement as below. -Dressed with Betadine, DSD. -Continue home dressing changes daily with dry gauze and triple antibiotic ointment -Continue off-loading with surgical shoe and extra depth diabetic shoes with a custom molded multi density insole. -Vascular testing including ABI PVR testing completed was normal bilateral lower extremity -HgbA1c: Unknown -Last antibiotics: eRx for doxycycline 100 mg twice daily for 2 weeks -Imaging: x-ray reviewed, shows possible erosion or infection.  MRI of the right foot has been scheduled for later this month he will follow-up after that exam and to discuss results of this determine if the right  fifth toe is viable -Discussed that pending the MRI results we will have to discuss a plan for the right fifth toe which is at highest risk of needing amputation at this time.  Procedure: Excisional Debridement of Wound Rationale: Removal of non-viable soft tissue from the wound to promote healing.  Anesthesia: none Post-Debridement Wound Measurements: Wound measures Right foot 1.5x1.3x0.2.  Type of Debridement: Sharp Excisional Tissue Removed: Non-viable soft tissue Depth of Debridement: subcutaneous tissue. Technique: Sharp excisional debridement to bleeding, viable wound base.  Dressing: Dry, sterile, compression dressing. Disposition: Patient tolerated procedure well.   Return in about 3 weeks (around 10/09/2022) for Follow-up right foot ulcer.          Corinna Gab, DPM [facility] / [facility]

## 2022-09-21 ENCOUNTER — Ambulatory Visit (HOSPITAL_COMMUNITY): Payer: 59

## 2022-09-26 ENCOUNTER — Other Ambulatory Visit (HOSPITAL_COMMUNITY): Payer: Self-pay | Admitting: Podiatry

## 2022-09-26 DIAGNOSIS — L97321 Non-pressure chronic ulcer of left ankle limited to breakdown of skin: Secondary | ICD-10-CM

## 2022-09-26 DIAGNOSIS — L209 Atopic dermatitis, unspecified: Secondary | ICD-10-CM | POA: Diagnosis not present

## 2022-09-26 DIAGNOSIS — E119 Type 2 diabetes mellitus without complications: Secondary | ICD-10-CM | POA: Diagnosis not present

## 2022-09-28 NOTE — Progress Notes (Signed)
Remote ICD transmission.   

## 2022-10-02 ENCOUNTER — Encounter (HOSPITAL_COMMUNITY): Payer: Self-pay

## 2022-10-02 ENCOUNTER — Ambulatory Visit (HOSPITAL_COMMUNITY)
Admission: RE | Admit: 2022-10-02 | Discharge: 2022-10-02 | Disposition: A | Discharge status: Home / Self Care | Payer: 59 | Source: Ambulatory Visit | Attending: Cardiology | Admitting: Cardiology

## 2022-10-02 ENCOUNTER — Other Ambulatory Visit (INDEPENDENT_AMBULATORY_CARE_PROVIDER_SITE_OTHER): Payer: 59 | Admitting: Podiatry

## 2022-10-02 DIAGNOSIS — L97321 Non-pressure chronic ulcer of left ankle limited to breakdown of skin: Secondary | ICD-10-CM

## 2022-10-02 DIAGNOSIS — L97512 Non-pressure chronic ulcer of other part of right foot with fat layer exposed: Secondary | ICD-10-CM

## 2022-10-02 NOTE — Progress Notes (Signed)
MRI referral for hospital sent

## 2022-10-05 ENCOUNTER — Ambulatory Visit: Payer: 59 | Attending: Cardiovascular Disease | Admitting: Cardiovascular Disease

## 2022-10-05 ENCOUNTER — Encounter: Payer: Self-pay | Admitting: Cardiovascular Disease

## 2022-10-05 VITALS — BP 110/68 | HR 78 | Ht 66.0 in | Wt 233.8 lb

## 2022-10-05 DIAGNOSIS — I5042 Chronic combined systolic (congestive) and diastolic (congestive) heart failure: Secondary | ICD-10-CM

## 2022-10-05 DIAGNOSIS — I2510 Atherosclerotic heart disease of native coronary artery without angina pectoris: Secondary | ICD-10-CM | POA: Diagnosis not present

## 2022-10-05 DIAGNOSIS — E11621 Type 2 diabetes mellitus with foot ulcer: Secondary | ICD-10-CM

## 2022-10-05 DIAGNOSIS — Z95810 Presence of automatic (implantable) cardiac defibrillator: Secondary | ICD-10-CM | POA: Diagnosis not present

## 2022-10-05 DIAGNOSIS — E1142 Type 2 diabetes mellitus with diabetic polyneuropathy: Secondary | ICD-10-CM

## 2022-10-05 DIAGNOSIS — E785 Hyperlipidemia, unspecified: Secondary | ICD-10-CM | POA: Diagnosis not present

## 2022-10-05 DIAGNOSIS — L97519 Non-pressure chronic ulcer of other part of right foot with unspecified severity: Secondary | ICD-10-CM

## 2022-10-05 NOTE — Patient Instructions (Signed)

## 2022-10-05 NOTE — Progress Notes (Signed)
Cardiology Office Note:    Date:  10/05/2022   ID:  Jon Jones, DOB 03-Jan-1956, MRN [id number]  PCP:  Pcp, No   [facility] Providers Cardiologist:  [name], MDSchumann Click to update primary MD,subspecialty MD or APP then REFRESH:1}    Referring MD: No ref. provider found   Chief Complaint  Patient presents with   icd check  ICD  History of Present Illness:    Jon Jones is a 67 y.o. male with a hx of severe ischemic cardiomyopathy with LVEF 35% following delayed presentation with an extensive anterior wall myocardial infarction in 2016 (previous inferior myocardial infarction in 2015), status postimplantation of a dual-chamber ICD in 2018 at [facility]) now relocated to [place].  He is seeing Dr. [name] and Dr. [name] in sleep clinic.  Additional medical problems include obesity, type 2 diabetes mellitus, essential hypertension, hyperlipidemia, OSA on CPAP, PAD.  He has had an uneventful year from a cardiac point of view, but he has developed some toe ulcerations on the right fifth toe and left fourth toe respectively.  Currently without any redness or swelling in either foot.  He is seeing a English as a second language teacher in [place], Dr. [name] and was recommended an MRI of the right foot due to concern of possible deeper infection.  He has had a previous amputation of the left fifth toe.  Recent ABI/TBI 09/08/2022 was normal bilaterally.  He does have an MRI conditional ICD system.  Metabolic control has improved.  He tells me that his most recent hemoglobin A1c was 7.5%, down from 8.7% a year earlier.  He also reports that his cholesterol numbers were "good.  His new PCP is with [facility] [phone]).  I was unable to get the actual lab results today.  He has unchanged mild exertional dyspnea (NYHA functional class II) otherwise asymptomatic without chest pain, orthopnea, PND, lower extremity edema, claudication, focal neurological events,  palpitations or syncope or defibrillator discharges.  He has switched from Ozempic to [facility] and believes that this is helping with his appetite and glucose control.  He is on comprehensive medical therapy for heart failure with reduced ejection fraction including empagliflozin, metoprolol, Entresto and spironolactone.  His blood pressure is 110/68 and is occasionally lower at home.  Unlikely that there is a lot of room for improving his heart failure medicines.  The ICD function is normal.  Estimated generator longevity is 3.5 years.  He has not had episodes of sustained atrial or ventricular rhythm abnormalities.  He did have 1 episode labeled "nonsustained VT" which actually is ectopic atrial tachycardia with 1: 1 AV conduction, roughly 15 beats long.  He never requires atrial or ventricular pacing.  All lead parameters appear to be stable and in nominal range.  His thoracic impedance only went out of range once during March-April of this year and resolved spontaneously.  Unfortunately he has unplugged his remote monitor.  He told me that his bill for the remote download was $400 and he cannot afford that.  Need to clarify the situation with his insurance company.  His previous angina was described as heaviness in left side of his chest radiating to the left arm.   Past Medical History:  Diagnosis Date   5 years ago    AICD (automatic cardioverter/defibrillator) present    Anxiety    Arthritis    Cataract    CHF (congestive heart failure) ([name])    Coronary artery disease    Diabetes mellitus without complication (  [name])    Diabetic retinopathy ([name])    Dysrhythmia    GERD (gastroesophageal reflux disease)    PMH   Hypertension    Hypertensive retinopathy    Peripheral vascular disease ([name])    Peripheral vascular disease ([name])    Pneumonia    Shoulder impingement, right    Sleep apnea    wears cpap   Stomach ulcer    Wears glasses     Past Surgical History:  Procedure  Laterality Date   AMPUTATION Left 03/12/2019   Procedure: 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;  Surgeon: [name], MD;  Location: [facility] OR;  Service: Orthopedics;  Laterality: Left;   AMPUTATION Right 10/01/2020   Procedure: RIGHT 2nd TOE AMPUTATION;  Surgeon: [name], DPM;  Location: [facility] OR;  Service: Podiatry;  Laterality: Right;   CARDIAC CATHETERIZATION     CARDIAC DEFIBRILLATOR PLACEMENT     CIRCUMCISION N/A 11/05/2020   Procedure: CIRCUMCISION ADULT;  Surgeon: [name], MD;  Location: [facility] ORS;  Service: Urology;  Laterality: N/A;   I & D EXTREMITY Left 07/19/2016   Procedure: IRRIGATION AND DEBRIDEMENT EXTREMITY/OLECRANON(WASHOUT);  Surgeon: Tarry Kos, MD;  Location[facility] OR;  Service: Orthopedics;  Laterality: Left;   OLECRANON BURSECTOMY Left 07/19/2016   Procedure: LEFT ELBOW OLECRANON BURSECTOMY;  Surgeon: Tarry Kos, MD;  Location: [facility];  Service: Orthopedics;  Laterality: Left;   SHOULDER ARTHROSCOPY Right 05/16/2019   Procedure: Right Shoulder Arthroscopy;  Surgeon: [name], MD;  Location: [facility] OR;  Service: Orthopedics;  Laterality: Right;    Current Medications: Current Meds  Medication Sig   ACCU-CHEK AVIVA PLUS test strip TEST 3 TIMES DAILY   Accu-Chek Softclix Lancets lancets CHECK BLOOD SUGAR ONCE DAILY   aspirin 81 MG EC tablet Take 81 mg by mouth daily.   atorvastatin (LIPITOR) 80 MG tablet Take 1 tablet (80 mg total) by mouth every evening.   clopidogrel (PLAVIX) 75 MG tablet TAKE 1 TABLET BY MOUTH EVERY DAY   empagliflozin (JARDIANCE) 25 MG TABS tablet TAKE 1 TABLET(25 MG) BY MOUTH AT BEDTIME   gabapentin (NEURONTIN) 300 MG capsule TAKE 1 CAPSULE(300 MG) BY MOUTH AT BEDTIME   glipiZIDE (GLUCOTROL) 10 MG tablet TAKE 2 TABLETS(20 MG) BY MOUTH TWICE DAILY   JANUMET XR [phone] MG TB24 Take 1 tablet by mouth daily.   lisinopril (ZESTRIL) 10 MG tablet Take 10 mg by mouth daily.   metoprolol tartrate (LOPRESSOR) 100 MG tablet Take 100 mg by mouth 2  (two) times daily.   Misc. Devices MISC Cpap supplies-tubing mask Dx G47.33 and Z99.89   MOUNJARO 10 MG/0.5ML Pen Inject 10 mg into the skin once a week.   nystatin cream (MYCOSTATIN) SMARTSIG:Sparingly Topical 2-3 Times Daily PRN   RESTASIS 0.05 % ophthalmic emulsion Place 1 drop into both eyes in the morning and at bedtime.   sacubitril-valsartan (ENTRESTO) 24-26 MG Take 1 tablet by mouth 2 (two) times daily.   spironolactone (ALDACTONE) 25 MG tablet Take 0.5 tablets (12.5 mg total) by mouth every other day.   terbinafine (LAMISIL) 250 MG tablet Take 1 tablet by mouth daily.   VENTOLIN HFA 108 (90 Base) MCG/ACT inhaler INHALE 2 PUFFS BY MOUTH EVERY 6 HOURS AS NEEDED FOR WHEEZING OR SHORTNESS OF BREATH     Allergies:   Patient has no known allergies.   Social History   Socioeconomic History   Marital status: Married    Spouse name: Not on file   Number of  children: Not on file   Years of education: Not on file   Highest education level: Not on file  Occupational History   Not on file  Tobacco Use   Smoking status: Former    Current packs/day: 0.00    Average packs/day: 1 pack/day for 40.0 years (40.0 ttl pk-yrs)    Types: Cigarettes    Start date: 03/11/1979    Quit date: 03/11/2019    Years since quitting: 3.5   Smokeless tobacco: Never  Vaping Use   Vaping status: Never Used  Substance and Sexual Activity   Alcohol use: Not Currently   Drug use: Not Currently    Types: Oxycodone   Sexual activity: Not on file  Other Topics Concern   Not on file  Social History Narrative   Not on file   Social Determinants of Health   Financial Resource Strain: Not on file  Food Insecurity: Not on file  Transportation Needs: Not on file  Physical Activity: Not on file  Stress: Not on file  Social Connections: Not on file     Family History: The patient's family history includes Diabetes in his mother; Heart disease in his mother; Peripheral vascular disease in his  mother.  ROS:   Please see the history of present illness.     All other systems reviewed and are negative.  EKGs/Labs/Other Studies Reviewed:    The following studies were reviewed today: Comprehensive device interrogation in the office today. Notes from Dr. [name], D.P.M. ABI/TBI 09/08/2022  EKG:  EKG is performed today and personally reviewed shows normal sinus rhythm, sequelae of old anterior and old inferior infarctions, unchanged from previous tracing  Recent Labs: No results found for requested labs within last 365 days.  Recent Lipid Panel    Component Value Date/Time   CHOL 128 01/19/2021 0946   TRIG 199 (H) 01/19/2021 0946   HDL 33 (L) 01/19/2021 0946   CHOLHDL 3.9 01/19/2021 0946   LDLCALC 62 01/19/2021 0946     Risk Assessment/Calculations:       Physical Exam:    VS:  BP 110/68 (BP Location: Left Arm, Patient Position: Sitting, Cuff Size: Large)   Pulse 78   Ht 5\' 6"  (1.676 m)   Wt 233 lb 12.8 oz (106.1 kg)   SpO2 94%   BMI 37.74 kg/m     Wt Readings from Last 3 Encounters:  10/05/22 233 lb 12.8 oz (106.1 kg)  07/03/22 227 lb (103 kg)  07/25/21 232 lb 6.4 oz (105.4 kg)      General: Alert, oriented x3, no distress, healthy left subclavian pacemaker/ICD system Head: no evidence of trauma, PERRL, EOMI, no exophtalmos or lid lag, no myxedema, no xanthelasma; normal ears, nose and oropharynx Neck: normal jugular venous pulsations and no hepatojugular reflux; brisk carotid pulses without delay and no carotid bruits Chest: clear to auscultation, no signs of consolidation by percussion or palpation, normal fremitus, symmetrical and full respiratory excursions Cardiovascular: normal position and quality of the apical impulse, regular rhythm, normal first and second heart sounds, no murmurs, rubs or gallops Abdomen: no tenderness or distention, no masses by palpation, no abnormal pulsatility or arterial bruits, normal bowel sounds, no  hepatosplenomegaly Extremities: Bilateral 2+ dorsalis pedis pulses.  Unable to palpate PT's.  Dressing on the right fifth toe.  Amputation left fifth toe. Neurological: grossly nonfocal Psych: Normal mood and affect    ASSESSMENT:    1. Chronic combined systolic and diastolic heart failure ([name])   2. Coronary  artery disease involving native coronary artery of native heart without angina pectoris   3. ICD (implantable cardioverter-defibrillator) in place   4. Dyslipidemia (high LDL; low HDL)   5. Type 2 diabetes mellitus with peripheral neuropathy ([name])   6. Diabetic ulcer of toe of right foot associated with type 2 diabetes mellitus, unspecified ulcer stage ([name])    PLAN:    In order of problems listed above:  CHF: NYHA functional class II, stable.  Clinically euvolemic.  Had brief evidence of hypervolemia a couple of months ago that resolved spontaneously.  He does not require loop diuretics.  He is on Arni/beta-blocker/aldosterone antagonist/SGLT2 inhibitor.  CAD: Asymptomatic.  Previous angina was left chest heaviness radiating to the left arm.  History of both inferior and anterior infarctions in the past. On aspirin, statin, beta-blocker. ICD: Implanted for primary prevention.  There have been no therapies for tachyarrhythmia to date.  Normal device function.  Need to resume remote monitoring but he is loath to do it until we make sure he will get another big bill.  Need to figure out why his out-of-pocket charge was so high.  Reached out to our billing service HLP: Encouraged weight loss and exercise.  Reports [facility] were [facility].  He is on a statin.  Has chronically low HDL but has had decent LDL on the most recent labs that I can review. DM: Currently on Farxiga Mounjaro, Sitagliptin-metformin and glipizide.  His most recent hemoglobin A1c was an improvement.  Scheduled to go MRI to exclude osteomyelitis of the right fifth toe.  May need amputation.         Medication Adjustments/Labs and Tests Ordered: Current medicines are reviewed at length with the patient today.  Concerns regarding medicines are outlined above.  Orders Placed This Encounter  Procedures   EKG 12-Lead   No orders of the defined types were placed in this encounter.   Patient Instructions  Medication Instructions:  No changes *If you need a refill on your cardiac medications before your next appointment, please call your pharmacy*  Follow-Up: At [facility], you and your health needs are our priority.  As part of our continuing mission to provide you with exceptional heart care, we have created designated Provider Care Teams.  These Care Teams include your primary Cardiologist (physician) and Advanced Practice Providers (APPs -  Physician Assistants and Nurse Practitioners) who all work together to provide you with the care you need, when you need it.  We recommend signing up for the patient portal called "MyChart".  Sign up information is provided on this After Visit Summary.  MyChart is used to connect with patients for Virtual Visits (Telemedicine).  Patients are able to view lab/test results, encounter notes, upcoming appointments, etc.  Non-urgent messages can be sent to your provider as well.   To learn more about what you can do with MyChart, go to [email or web address].    Your next appointment:   1 year(s)  Provider:   Dr Royann Shivers    Signed, Thurmon Fair, MD  10/05/2022 2:59 PM    [facility]

## 2022-10-09 ENCOUNTER — Ambulatory Visit (INDEPENDENT_AMBULATORY_CARE_PROVIDER_SITE_OTHER): Payer: 59 | Admitting: Podiatry

## 2022-10-09 DIAGNOSIS — Z89421 Acquired absence of other right toe(s): Secondary | ICD-10-CM

## 2022-10-09 DIAGNOSIS — L97522 Non-pressure chronic ulcer of other part of left foot with fat layer exposed: Secondary | ICD-10-CM

## 2022-10-09 DIAGNOSIS — L97512 Non-pressure chronic ulcer of other part of right foot with fat layer exposed: Secondary | ICD-10-CM | POA: Diagnosis not present

## 2022-10-09 DIAGNOSIS — E1159 Type 2 diabetes mellitus with other circulatory complications: Secondary | ICD-10-CM

## 2022-10-09 NOTE — Progress Notes (Signed)
Subjective:  Patient ID: Jon Jones, male    DOB: 1955/10/09,  MRN: [id number]  Chief Complaint  Patient presents with   Wound Check    3 week follow up for ulcer of right toe. Patient has been soaking his feet in warm water with epsom salt. He stated the Iodine causes pain and burning. His friend recommended to use medihoney if the doctor believes it will be a good idea. He has not received a call for schedule the MRI.     67 y.o. male presents for follow up of bilateral foot wounds. Right 5th toe and Left 4th toe.  Patient reports the wounds been doing well since prior visit.  He had been putting Betadine on the wound but says it burns.   Interested in possible Medihoney application to the wound.  He has not yet received a call to schedule his MRI which she has been out of the hospital due to a pacemaker/defibrillator.  Past Medical History:  Diagnosis Date   5 years ago    AICD (automatic cardioverter/defibrillator) present    Anxiety    Arthritis    Cataract    CHF (congestive heart failure) ([name])    Coronary artery disease    Diabetes mellitus without complication ([name])    Diabetic retinopathy ([name])    Dysrhythmia    GERD (gastroesophageal reflux disease)    PMH   Hypertension    Hypertensive retinopathy    Peripheral vascular disease ([name])    Peripheral vascular disease ([name])    Pneumonia    Shoulder impingement, right    Sleep apnea    wears cpap   Stomach ulcer    Wears glasses     No Known Allergies  ROS: Negative except as per HPI above  Objective:  General: AAO x3, NAD  Dermatological: Ulceration present on the dorsal lateral aspect of the left fourth toe and right 5th toe which is improved from prior.  There is decreased erythema and edema of the right 5th toe.   Wound measures Right foot 1.3x 1.2 x0.2. Left foot has dry eschar overlying closed wound.  See pictures below   Vascular:  Dorsalis Pedis 2/4 but PT pulses non palpable.   Neruologic: Grossly  absent to light touch protective sensation absent  Musculoskeletal: Prior R 2nd toe amp, Left 5th toe amp  Gait: Unassisted, Nonantalgic.   Radiographs:  Date: 09/05/2022 XR bilateral foot weightbearing AP/Lateral/Oblique   Findings: No obvious evidence of soft tissue emphysema or erosions in the right fifth toe or the left fourth toe however cannot rule out based on these x-rays especially on the right fifth toe. Assessment:   1. Ulcer of toe, right, with fat layer exposed ([name])   2. Ulcer of toe, left, with fat layer exposed ([name])   3. History of amputation of lesser toe of right foot ([name])   4. Type 2 diabetes mellitus with other circulatory complication, without long-term current use of insulin ([name])        Plan:  Patient was evaluated and treated and all questions answered.  # Left fourth toe ulceration of the dorsal lateral aspect  - healed # Right 5th toe ulcer dorsolateral aspect right worse than left.  Concern for under lying osteomyelitis of the right fifth toe  -We discussed the etiology and factors that are a part of the wound healing process.  We also discussed the risk of infection both soft tissue and osteomyelitis from open ulceration.  Discussed the risk of  limb loss if this happens or worsens. -Debridement as below. -Dressed with Medihoney and dry sterile dressing -Continue home dressing changes daily with Medihoney and dry gauze dressing -Continue off-loading with surgical shoe and extra depth diabetic shoes with a custom molded multi density insole. -Vascular testing including ABI PVR testing completed was normal bilateral lower extremity -HgbA1c: Unknown -Last antibiotics: No current indication for antibiotics improved from prior with no evidence of acute infection in the fifth toe -Imaging: x-ray reviewed, shows possible erosion or infection.  MRI still pending on the right foot.  Will attempt to resend  Procedure: Excisional Debridement of Wound Rationale:  Removal of non-viable soft tissue from the wound to promote healing.  Anesthesia: none Post-Debridement Wound Measurements: Wound measures Right foot 1.3x1.3x0.2.  Type of Debridement: Sharp Excisional Tissue Removed: Non-viable soft tissue Depth of Debridement: subcutaneous tissue. Technique: Sharp excisional debridement to bleeding, viable wound base.  Dressing: Dry, sterile, compression dressing. Disposition: Patient tolerated procedure well.   Return in about 4 weeks (around 11/06/2022) for f/u R 5th toe ulcer.          Corinna Gab, DPM [facility] / [facility]

## 2022-10-10 ENCOUNTER — Ambulatory Visit (INDEPENDENT_AMBULATORY_CARE_PROVIDER_SITE_OTHER): Payer: 59 | Admitting: Orthopedic Surgery

## 2022-10-10 DIAGNOSIS — L97521 Non-pressure chronic ulcer of other part of left foot limited to breakdown of skin: Secondary | ICD-10-CM

## 2022-10-10 DIAGNOSIS — L97511 Non-pressure chronic ulcer of other part of right foot limited to breakdown of skin: Secondary | ICD-10-CM

## 2022-10-10 DIAGNOSIS — M25511 Pain in right shoulder: Secondary | ICD-10-CM

## 2022-10-11 ENCOUNTER — Telehealth: Payer: Self-pay | Admitting: Emergency Medicine

## 2022-10-11 NOTE — Telephone Encounter (Signed)
Spoke with the patient and explained the information below. He states that he will plug his device back up. He said,"God bless you."  Message Received: Today Croitoru, Rachelle Hora, MD  Scheryl Marten, RN Katie, can you please tell him this and have him plug his device back up?       Previous Messages    ----- Message ----- From: Clide Dales Sent: 10/05/2022   3:06 PM EDT To: Thurmon Fair, MD  Do you have a date of service?  His last remote of 09/05/22 was $294 before insurance.  We're waiting for them to process it.  The one previous to 06/25 was 06/06/22, leaving him with $11.22 balance.  I have no idea where the $400 is coming from.  None of our remotes cost that much.  Would he provide the insurnace bill/EOB to you so we can look at it?   Bjorn Loser ----- Message ----- From: Thurmon Fair, MD Sent: 10/05/2022   2:56 PM EDT To: Clide Dales; [name], MD; *  Hello, [facility] you can figure this out for me.  This patient has a defibrillator but when he did a remote download he received a bill from his insurance company for $400.  This appears very unreasonable.  He reports he cannot afford this kind of bill every 3 months.  Because of it, he has unplugged his remote monitor, which is not a safe situation and will prevent [place] from providing him the best treatment.  Could you figure out if there is may be a billing error for [place]? Thanks, [employer]

## 2022-10-12 ENCOUNTER — Telehealth: Payer: Self-pay

## 2022-10-12 NOTE — Telephone Encounter (Signed)
I think we figured it out.  He really does not owe as much money as he thought he did.  Our billing people checked and it is only going to be about $11 out-of-pocket cost for each remote check.  Lets continue with remote downloads please.

## 2022-10-12 NOTE — Telephone Encounter (Signed)
-----   Message from Nurse Earnstine Regal sent at 10/05/2022  3:52 PM EDT ----- Dr. Royann Shivers,   Wow, that's just awful. I am not sure about the billing aspect so we typically send these questions to Erskine Squibb. I would like to add the CMA's to see if any options are available for monitoring until this is resolved.   Thank you, Marliss Czar, RN ----- Message ----- From: Thurmon Fair, MD Sent: 10/05/2022   2:56 PM EDT To: Clide Dales; [name], MD; #  Hello, [facility] you can figure this out for me.  This patient has a defibrillator but when he did a remote download he received a bill from his insurance company for $400.  This appears very unreasonable.  He reports he cannot afford this kind of bill every 3 months.  Because of it, he has unplugged his remote monitor, which is not a safe situation and will prevent [place] from providing him the best treatment.  Could you figure out if there is may be a billing error for [place]? Thanks,  [employer]

## 2022-10-12 NOTE — Telephone Encounter (Signed)
The patient cannot afford remote transmission can we stop remote monitor and have the patient come into the office every 3 months to get his device check?

## 2022-10-18 ENCOUNTER — Encounter (INDEPENDENT_AMBULATORY_CARE_PROVIDER_SITE_OTHER): Payer: 59 | Admitting: Ophthalmology

## 2022-10-18 DIAGNOSIS — I10 Essential (primary) hypertension: Secondary | ICD-10-CM

## 2022-10-18 DIAGNOSIS — H35033 Hypertensive retinopathy, bilateral: Secondary | ICD-10-CM

## 2022-10-18 DIAGNOSIS — H25813 Combined forms of age-related cataract, bilateral: Secondary | ICD-10-CM

## 2022-10-18 DIAGNOSIS — Z7984 Long term (current) use of oral hypoglycemic drugs: Secondary | ICD-10-CM

## 2022-10-18 DIAGNOSIS — Z7985 Long-term (current) use of injectable non-insulin antidiabetic drugs: Secondary | ICD-10-CM

## 2022-10-18 DIAGNOSIS — E113313 Type 2 diabetes mellitus with moderate nonproliferative diabetic retinopathy with macular edema, bilateral: Secondary | ICD-10-CM

## 2022-10-23 ENCOUNTER — Encounter: Payer: Self-pay | Admitting: Orthopedic Surgery

## 2022-10-23 NOTE — Progress Notes (Signed)
Office Visit Note   [name]           Date of Birth: Jun 12, 1955           MRN: [id number] Visit Date: 10/10/2022              Requested by: No referring provider defined for this encounter. PCP: Pcp, No  Chief Complaint  Patient presents with   Right Foot - Follow-up   Left Foot - Follow-up      HPI: Patient is a 67 year old gentleman who is seen follow-up for bilateral foot ulcers he has completed his antibiotics.  Patient also complains of right shoulder pain patient does have a defibrillator and MRI scan is not an option.  Assessment & Plan: Visit Diagnoses:  1. Ulcer of toe of right foot, limited to breakdown of skin ([name])   2. Ulcer of toe of left foot, limited to breakdown of skin ([name])   3. Acute pain of right shoulder     Plan: Donated Kerecis was applied to the foot ulcer.  Follow-Up Instructions: Return in about 2 weeks (around 10/24/2022).   Ortho Exam  Patient is alert, oriented, no adenopathy, well-dressed, normal affect, normal respiratory effort. Examination patient has a flat granulating wound on the dorsum of the fifth toe that measures 10 x 15 mm.  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  Patient has a palpable dorsalis pedis pulse bilaterally.  Imaging: No results found.   Labs: Lab Results  Component Value Date   HGBA1C 8.7 (H) 10/19/2020   HGBA1C 9.8 (H) 09/08/2020   HGBA1C 11.0 (A) 07/23/2020   ESRSEDRATE 11 03/11/2019   CRP 1.8 (H) 03/11/2019   REPTSTATUS 10/04/2020 FINAL 09/29/2020   GRAMSTAIN  07/19/2016    RARE WBC PRESENT, PREDOMINANTLY MONONUCLEAR NO ORGANISMS SEEN    CULT  09/29/2020    NO GROWTH 5 DAYS Performed at [facility], [address], [place], [place] [address]      Lab Results  Component Value Date   ALBUMIN 3.5 09/29/2020   ALBUMIN 4.2 04/26/2020   ALBUMIN 3.7 03/11/2019    No results found for: "MG" No results found for:  "VD25OH"  No results found for: "PREALBUMIN"    Latest Ref Rng & Units 10/19/2020   10:59 AM 10/01/2020    5:08 AM 09/30/2020    4:56 AM  CBC EXTENDED  WBC 4.0 - 10.5 K/uL 8.0  7.4  8.4   RBC 4.22 - 5.81 MIL/uL 4.45  4.32  3.99   Hemoglobin 13.0 - 17.0 g/dL 41.3  24.4  01.0   HCT 39.0 - 52.0 % 42.3  40.1  36.6   Platelets 150 - 400 K/uL 183  202  170      There is no height or weight on file to calculate BMI.  Orders:  No orders of the defined types were placed in this encounter.  No orders of the defined types were placed in this encounter.    Procedures: No procedures performed  Clinical Data: No additional findings.  ROS:  All other systems negative, except as noted in the HPI. Review of Systems  Objective: Vital Signs: There were no vitals taken for this visit.  Specialty Comments:  Ct Myelogram  IMPRESSION:  1. L5-S1: Severe multifactorial spinal stenosis. Advanced bilateral  facet arthropathy with 5 mm of anterolisthesis which increases to 8  mm with standing and flexion. Bulging of  the disc. 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 Marked constriction of the thecal sac  due to these processes in combination with abundant epidural fat.  Severe bilateral foraminal stenosis could compress either or both  exiting L5 nerves.  2. L4-5: Moderate multifactorial spinal stenosis.  3. T12-L1: Right foraminal encroachment by osteophyte could possibly  affect the right T12 nerve.    Aortic Atherosclerosis (ICD10-I70.0).      Electronically Signed    By: Paulina Fusi M.D.    On: 06/29/2020 12:39  PMFS History: Patient Active Problem List   Diagnosis Date Noted   Diabetic neuropathy ([name]) 10/31/2021   Pain due to onychomycosis of toenails of both feet 01/21/2021   Phimosis of penis 10/06/2020   Cellulitis 09/29/2020   Cellulitis of right toe 09/29/2020   Chronic combined systolic and  diastolic heart failure ([name]) 09/15/2020   Onychomycosis of toenail 08/24/2020   Diabetes mellitus ([name]) 07/23/2020   Type 2 diabetes mellitus with hyperglycemia, without long-term current use of insulin ([name]) 07/23/2020   Weight gain 07/23/2020   Type 2 diabetes mellitus with peripheral neuropathy ([name]) 07/23/2020   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 diabetes mellitus ([name]) 05/04/2020   Hypertensive heart disease with heart failure ([name]) 04/26/2020   Type 2 diabetes mellitus with diabetic peripheral angiopathy without gangrene, without long-term current use of insulin ([name]) 04/26/2020   OSA on CPAP 04/26/2020   PAD (peripheral artery disease) ([name]) 04/26/2020   CAD in native artery 04/26/2020   Class 2 severe obesity due to excess calories with serious comorbidity and body mass index (BMI) of 38.0 to 38.9 in adult [facility]) 04/26/2020   Impingement syndrome of right shoulder    Nontraumatic complete tear of right rotator cuff    Type 2 diabetes mellitus with foot ulcer, without long-term current use of insulin ([name])    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   S/P debridement 07/19/2016   Acute gout of left elbow 07/19/2016   Past Medical History:  Diagnosis Date   5 years ago    AICD (automatic cardioverter/defibrillator) present    Anxiety    Arthritis    Cataract    CHF (congestive heart failure) ([name])    Coronary artery disease    Diabetes mellitus without complication ([name])    Diabetic retinopathy ([name])    Dysrhythmia    GERD (gastroesophageal reflux disease)    PMH   Hypertension    Hypertensive retinopathy    Peripheral vascular disease ([name])    Peripheral vascular disease ([name])    Pneumonia    Shoulder impingement, right    Sleep apnea    wears cpap   Stomach ulcer    Wears glasses     Family History  Problem Relation Age of Onset   Heart disease Mother    Diabetes Mother    Peripheral vascular disease Mother     Past Surgical History:   Procedure Laterality Date   AMPUTATION Left 03/12/2019   Procedure: 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;  Surgeon: [name], MD;  Location[facility];  Service: Orthopedics;  Laterality: Left;   AMPUTATION Right 10/01/2020   Procedure: RIGHT 2nd TOE AMPUTATION;  Surgeon: [name], DPM;  Location: [facility] OR;  Service: Podiatry;  Laterality: Right;   CARDIAC CATHETERIZATION     CARDIAC DEFIBRILLATOR PLACEMENT     CIRCUMCISION N/A 11/05/2020   Procedure: CIRCUMCISION  ADULT;  Surgeon: [name], MD;  Location: [facility] ORS;  Service: Urology;  Laterality: N/A;   I & D EXTREMITY Left 07/19/2016   Procedure: IRRIGATION AND DEBRIDEMENT EXTREMITY/OLECRANON(WASHOUT);  Surgeon: Tarry Kos, MD;  Location: [facility] OR;  Service: Orthopedics;  Laterality: Left;   OLECRANON BURSECTOMY Left 07/19/2016   Procedure: LEFT ELBOW OLECRANON BURSECTOMY;  Surgeon: Tarry Kos, MD;  Location: [facility];  Service: Orthopedics;  Laterality: Left;   SHOULDER ARTHROSCOPY Right 05/16/2019   Procedure: Right Shoulder Arthroscopy;  Surgeon: [name], MD;  Location: [facility] OR;  Service: Orthopedics;  Laterality: Right;   Social History   Occupational History   Not on file  Tobacco Use   Smoking status: Former    Current packs/day: 0.00    Average packs/day: 1 pack/day for 40.0 years (40.0 ttl pk-yrs)    Types: Cigarettes    Start date: 03/11/1979    Quit date: 03/11/2019    Years since quitting: 3.6   Smokeless tobacco: Never  Vaping Use   Vaping status: Never Used  Substance and Sexual Activity   Alcohol use: Not Currently   Drug use: Not Currently    Types: Oxycodone   Sexual activity: Not on file

## 2022-10-24 ENCOUNTER — Ambulatory Visit: Payer: 59 | Admitting: Orthopedic Surgery

## 2022-10-24 DIAGNOSIS — L97511 Non-pressure chronic ulcer of other part of right foot limited to breakdown of skin: Secondary | ICD-10-CM

## 2022-10-25 ENCOUNTER — Encounter: Payer: Self-pay | Admitting: Orthopedic Surgery

## 2022-10-25 NOTE — Progress Notes (Signed)
Office Visit Note   [name]           Date of Birth: [birth year]           MRN: [id number] Visit Date: 10/24/2022              Requested by: No referring provider defined for this encounter. PCP: Pcp, No  Chief Complaint  Patient presents with   Right Foot - Follow-up    S/p in office donation kerecis graft 5th toe       HPI: Patient is a 67 year old gentleman who presents in follow-up for ulceration right foot fifth toe.  Donated Kerecis tissue graft was applied at the last visit.  Assessment & Plan: Visit Diagnoses:  1. Ulcer of toe of right foot, limited to breakdown of skin ([name])     Plan: Continue routine wound care patient was provided a compression sock to be worn at all times.  Follow-Up Instructions: Return in about 2 weeks (around 11/07/2022).   Ortho Exam  Patient is alert, oriented, no adenopathy, well-dressed, normal affect, normal respiratory effort. Examination there is no cellulitis no sausage digit swelling of the right foot little toe.  There is a flat 100% granulating wound over the dorsum of the PIP joint that is 1 cm in diameter no exposed bone or tendon no drainage.  Imaging: No results found. No images are attached to the encounter.  Labs: Lab Results  Component Value Date   HGBA1C 8.7 (H) 10/19/2020   HGBA1C 9.8 (H) 09/08/2020   HGBA1C 11.0 (A) 07/23/2020   ESRSEDRATE 11 03/11/2019   CRP 1.8 (H) 03/11/2019   REPTSTATUS 10/04/2020 FINAL 09/29/2020   GRAMSTAIN  07/19/2016    RARE WBC PRESENT, PREDOMINANTLY MONONUCLEAR NO ORGANISMS SEEN    CULT  09/29/2020    NO GROWTH 5 DAYS Performed at [facility], [address] [place], [place] [address]      Lab Results  Component Value Date   ALBUMIN 3.5 09/29/2020   ALBUMIN 4.2 04/26/2020   ALBUMIN 3.7 03/11/2019    No results found for: "MG" No results found for: "VD25OH"  No results found for: "PREALBUMIN"    Latest Ref Rng & Units 10/19/2020   10:59 AM 10/01/2020     5:08 AM 09/30/2020    4:56 AM  CBC EXTENDED  WBC 4.0 - 10.5 K/uL 8.0  7.4  8.4   RBC 4.22 - 5.81 MIL/uL 4.45  4.32  3.99   Hemoglobin 13.0 - 17.0 g/dL 32.4  40.1  02.7   HCT 39.0 - 52.0 % 42.3  40.1  36.6   Platelets 150 - 400 K/uL 183  202  170      There is no height or weight on file to calculate BMI.  Orders:  No orders of the defined types were placed in this encounter.  No orders of the defined types were placed in this encounter.    Procedures: No procedures performed  Clinical Data: No additional findings.  ROS:  All other systems negative, except as noted in the HPI. Review of Systems  Objective: Vital Signs: There were no vitals taken for this visit.  Specialty Comments:  Ct Myelogram  IMPRESSION:  1. L5-S1: Severe multifactorial spinal stenosis. Advanced bilateral  facet arthropathy with 5 mm of anterolisthesis which increases to 8  mm with standing and flexion. Bulging of the disc. Complex synovial  cyst arising from the facet joint on the left within the posterior  left  side of the spinal canal. Marked constriction of the thecal sac  due to these processes in combination with abundant epidural fat.  Severe bilateral foraminal stenosis could compress either or both  exiting L5 nerves.  2. L4-5: Moderate multifactorial spinal stenosis.  3. T12-L1: Right foraminal encroachment by osteophyte could possibly  affect the right T12 nerve.    Aortic Atherosclerosis (ICD10-I70.0).      Electronically Signed    By: Paulina Fusi M.D.    On: 06/29/2020 12:39  PMFS History: Patient Active Problem List   Diagnosis Date Noted   Diabetic neuropathy ([name]) 10/31/2021   Pain due to onychomycosis of toenails of both feet 01/21/2021   Phimosis of penis 10/06/2020   Cellulitis 09/29/2020   Cellulitis of right toe 09/29/2020   Chronic combined systolic and diastolic heart failure ([name]) 09/15/2020   Onychomycosis of toenail 08/24/2020   Diabetes mellitus ([name])  07/23/2020   Type 2 diabetes mellitus with hyperglycemia, without long-term current use of insulin ([name]) 07/23/2020   Weight gain 07/23/2020   Type 2 diabetes mellitus with peripheral neuropathy ([name]) 07/23/2020   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 diabetes mellitus ([name]) 05/04/2020   Hypertensive heart disease with heart failure ([name]) 04/26/2020   Type 2 diabetes mellitus with diabetic peripheral angiopathy without gangrene, without long-term current use of insulin ([name]) 04/26/2020   OSA on CPAP 04/26/2020   PAD (peripheral artery disease) ([name]) 04/26/2020   CAD in native artery 04/26/2020   Class 2 severe obesity due to excess calories with serious comorbidity and body mass index (BMI) of 38.0 to 38.9 in adult [facility]) 04/26/2020   Impingement syndrome of right shoulder    Nontraumatic complete tear of right rotator cuff    Type 2 diabetes mellitus with foot ulcer, without long-term current use of insulin ([name])    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   S/P debridement 07/19/2016   Acute gout of left elbow 07/19/2016   Past Medical History:  Diagnosis Date   5 years ago    AICD (automatic cardioverter/defibrillator) present    Anxiety    Arthritis    Cataract    CHF (congestive heart failure) ([name])    Coronary artery disease    Diabetes mellitus without complication ([name])    Diabetic retinopathy ([name])    Dysrhythmia    GERD (gastroesophageal reflux disease)    PMH   Hypertension    Hypertensive retinopathy    Peripheral vascular disease ([name])    Peripheral vascular disease ([name])    Pneumonia    Shoulder impingement, right    Sleep apnea    wears cpap   Stomach ulcer    Wears glasses     Family History  Problem Relation Age of Onset   Heart disease Mother    Diabetes Mother    Peripheral vascular disease Mother     Past Surgical History:  Procedure Laterality Date   AMPUTATION Left 03/12/2019   Procedure: 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;  Surgeon: [name], MD;  Location: [facility] OR;  Service: Orthopedics;  Laterality: Left;   AMPUTATION Right 10/01/2020   Procedure: RIGHT 2nd TOE AMPUTATION;  Surgeon: [name], DPM;  Location: [facility] OR;  Service: Podiatry;  Laterality: Right;   CARDIAC CATHETERIZATION     CARDIAC DEFIBRILLATOR PLACEMENT     CIRCUMCISION N/A 11/05/2020   Procedure: CIRCUMCISION ADULT;  Surgeon: [name], MD;  Location: [facility];  Service: Urology;  Laterality: N/A;   I &  D EXTREMITY Left 07/19/2016   Procedure: IRRIGATION AND DEBRIDEMENT EXTREMITY/OLECRANON(WASHOUT);  Surgeon: Tarry Kos, MD;  Location: [facility] OR;  Service: Orthopedics;  Laterality: Left;   OLECRANON BURSECTOMY Left 07/19/2016   Procedure: LEFT ELBOW OLECRANON BURSECTOMY;  Surgeon: Tarry Kos, MD;  Location: [facility];  Service: Orthopedics;  Laterality: Left;   SHOULDER ARTHROSCOPY Right 05/16/2019   Procedure: Right Shoulder Arthroscopy;  Surgeon: [name], MD;  Location: [facility] OR;  Service: Orthopedics;  Laterality: Right;   Social History   Occupational History   Not on file  Tobacco Use   Smoking status: Former    Current packs/day: 0.00    Average packs/day: 1 pack/day for 40.0 years (40.0 ttl pk-yrs)    Types: Cigarettes    Start date: 03/11/1979    Quit date: 03/11/2019    Years since quitting: 3.6   Smokeless tobacco: Never  Vaping Use   Vaping status: Never Used  Substance and Sexual Activity   Alcohol use: Not Currently   Drug use: Not Currently    Types: Oxycodone   Sexual activity: Not on file

## 2022-10-26 DIAGNOSIS — E119 Type 2 diabetes mellitus without complications: Secondary | ICD-10-CM | POA: Diagnosis not present

## 2022-10-26 DIAGNOSIS — L209 Atopic dermatitis, unspecified: Secondary | ICD-10-CM | POA: Diagnosis not present

## 2022-10-27 DIAGNOSIS — G4733 Obstructive sleep apnea (adult) (pediatric): Secondary | ICD-10-CM | POA: Diagnosis not present

## 2022-11-02 ENCOUNTER — Inpatient Hospital Stay: Admission: RE | Admit: 2022-11-02 | Payer: 59 | Source: Ambulatory Visit

## 2022-11-03 ENCOUNTER — Other Ambulatory Visit (INDEPENDENT_AMBULATORY_CARE_PROVIDER_SITE_OTHER): Payer: 59

## 2022-11-03 ENCOUNTER — Ambulatory Visit: Payer: 59 | Admitting: Orthopedic Surgery

## 2022-11-03 DIAGNOSIS — M5416 Radiculopathy, lumbar region: Secondary | ICD-10-CM | POA: Diagnosis not present

## 2022-11-03 NOTE — Progress Notes (Signed)
Orthopedic Spine Surgery Office Note  Assessment: Patient is a 67 y.o. male with low back pain that radiates into the bilateral calves when upright or walking, suspect neurogenic claudication   Plan: -Explained that initially conservative treatment is tried as a significant number of patients may experience relief with these treatment modalities. Discussed that the conservative treatments include:  -activity modification  -physical therapy  -over the counter pain medications  -medrol dosepak  -lumbar steroid injections -Patient has tried Tylenol, ibuprofen -Since his pain is well-controlled with Tylenol and ibuprofen, he should continue to use those.  I told him that he can use 1000 g of Tylenol as needed 3 times per day.  He should use ibuprofen more for flares of pain and not as regularly -I told him if his pain gets more significant and starts to interfere more with his life, then would get an MRI to look for spinal stenosis -Would need an updated A1C and for it to be 7.5 or less prior to any elective spine surgery. He would also need to get down to a weight of 215lbs -Patient should return to office on an as-needed basis   Patient expressed understanding of the plan and all questions were answered to the patient's satisfaction.   ___________________________________________________________________________   History:  Patient is a 67 y.o. male who presents today for lumbar spine.  Patient has had several years of low back pain that radiates into the bilateral calves.  There was no trauma or injury that preceded the onset of pain.  Pain is felt when he walks more than 3 minutes or when he stands to do dishes for more than a couple minutes.  Pain is quickly relieved when he sits down.  He notes it is also better if he flexes the lumbar spine.  He has had injections in the past which were helpful.  Lately, he has just been using Tylenol and ibuprofen and finds that that controls his pain  well.  He has done less walking but he is okay with that.   Weakness: Denies Symptoms of imbalance: Denies Paresthesias and numbness: Denies Bowel or bladder incontinence: Denies Saddle anesthesia: Denies  Treatments tried: Tylenol, ibuprofen  Review of systems: Denies fevers and chills, night sweats, unexplained weight loss, history of cancer, pain that wakes them at night  Past medical history: CHF CAD DM  DM retinopathy PVD OSA Atrial fibrillation History of MI GERD  Allergies: NKDA  Past surgical history:  Left 5th toe amputation Left olecranon bursectomy Left elbow I&D Right shoulder arthroscopy Right 2nd toe amputation  Social history: Denies use of nicotine product (smoking, vaping, patches, smokeless) Alcohol use: Denies Denies recreational drug use   Physical Exam:  BMI of 37.8  General: no acute distress, appears stated age Neurologic: alert, answering questions appropriately, following commands Respiratory: unlabored breathing on room air, symmetric chest rise Psychiatric: appropriate affect, normal cadence to speech   MSK (spine):  -Strength exam      Left  Right EHL    5/5  5/5 TA    5/5  5/5 GSC    5/5  5/5 Knee extension  5/5  5/5 Hip flexion   5/5  5/5  -Sensory exam    Sensation intact to light touch in L3-S1 nerve distributions of bilateral lower extremities  -Achilles DTR: 1/4 on the left, 1/4 on the right -Patellar tendon DTR: 1/4 on the left, 1/4 on the right  -Straight leg raise: Negative bilaterally -Femoral nerve stretch test: Negative bilaterally -  Clonus: no beats bilaterally  -Left hip exam: Positive FADIR, negative Stinchfield, no pain through range of motion, negative FABER -Right hip exam: No pain through range of motion, negative Stinchfield, negative FABER  Imaging: XRs of the lumbar spine from 11/03/2022 was independently reviewed and interpreted, showing degenerative changes within the left hip with  significant joint space narrowing.  Spondylolisthesis seen at L5/S1 that shifts about 1mm between flexion and extension views.  There is disc height loss at L4/5 and L5/S1.  No fracture or dislocation seen.   Patient name: Jon Jones Patient MRN: [id number] Date of visit: 11/03/22

## 2022-11-07 ENCOUNTER — Ambulatory Visit: Payer: 59 | Admitting: Family

## 2022-11-08 ENCOUNTER — Encounter: Payer: Self-pay | Admitting: Podiatry

## 2022-11-08 ENCOUNTER — Ambulatory Visit: Payer: 59 | Admitting: Podiatry

## 2022-11-08 DIAGNOSIS — Z89421 Acquired absence of other right toe(s): Secondary | ICD-10-CM

## 2022-11-08 DIAGNOSIS — M79674 Pain in right toe(s): Secondary | ICD-10-CM

## 2022-11-08 DIAGNOSIS — M79675 Pain in left toe(s): Secondary | ICD-10-CM | POA: Diagnosis not present

## 2022-11-08 DIAGNOSIS — B351 Tinea unguium: Secondary | ICD-10-CM

## 2022-11-08 DIAGNOSIS — L97512 Non-pressure chronic ulcer of other part of right foot with fat layer exposed: Secondary | ICD-10-CM | POA: Diagnosis not present

## 2022-11-08 MED ORDER — SILVER SULFADIAZINE 1 % EX CREA: 1.0000 | TOPICAL_CREAM | Freq: Every day | CUTANEOUS | 0 refills | Status: DC

## 2022-11-08 MED ORDER — DOXYCYCLINE HYCLATE 100 MG PO TABS: 100.0000 mg | ORAL_TABLET | Freq: Two times a day (BID) | ORAL | 0 refills | Status: AC

## 2022-11-08 NOTE — Progress Notes (Signed)
This patient returns to my office for at risk foot care.  This patient requires this care by a professional since this patient will be at risk due to having diabetic neuropathy.This patient is unable to cut nails himself since the patient cannot reach his nails.These nails are painful walking and wearing shoes.  He says he has been dealing with open wound on fifth toe right foot. By Dr.  Lia Foyer. This patient presents for at risk foot care today.  General Appearance  Alert, conversant and in no acute stress.  Vascular  Dorsalis pedis and posterior tibial  pulses are palpable  bilaterally.  Capillary return is within normal limits  bilaterally. Temperature is within normal limits  bilaterally.  Neurologic  Senn-Weinstein monofilament wire test diminished  bilaterally. Muscle power within normal limits bilaterally.  Nails Thick disfigured discolored nails with subungual debris  toes bilaterally. No evidence of bacterial infection or drainage bilaterally.  Orthopedic  No limitations of motion  feet .  No crepitus or effusions noted.  No bony pathology or digital deformities noted. 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  Skin  normotropic skin with no porokeratosis noted bilaterally.  No signs of infections or ulcers noted.     Onychomycosis  Pain in right toes  Pain in left toes  Consent was obtained for treatment procedures.   Mechanical debridement of nails 1-5  bilaterally performed with a nail nipper.  Filed with dremel without incident. Dr Allena Katz came to examine his fifth toe right foot. Dr.  Allena Katz prescribed silvadene and doxycycline for this patient.  DSD applied fifth toe right foot.   Return office visit     3 months.                Told patient to return for periodic foot care and evaluation due to potential at risk complications.   Helane Gunther DPM

## 2022-11-09 ENCOUNTER — Ambulatory Visit: Payer: 59 | Admitting: Podiatry

## 2022-11-27 DIAGNOSIS — G4733 Obstructive sleep apnea (adult) (pediatric): Secondary | ICD-10-CM | POA: Diagnosis not present

## 2022-12-05 ENCOUNTER — Ambulatory Visit: Payer: 59

## 2022-12-05 DIAGNOSIS — I5042 Chronic combined systolic (congestive) and diastolic (congestive) heart failure: Secondary | ICD-10-CM | POA: Diagnosis not present

## 2022-12-05 LAB — CUP PACEART REMOTE DEVICE CHECK
Battery Remaining Longevity: 40 mo
Battery Voltage: 2.97 V
Brady Statistic AP VP Percent: 0 %
Brady Statistic AP VS Percent: 0.4 %
Brady Statistic AS VP Percent: 0.03 %
Brady Statistic AS VS Percent: 99.56 %
Brady Statistic RA Percent Paced: 0.41 %
Brady Statistic RV Percent Paced: 0.04 %
Date Time Interrogation Session: 20240924073829
HighPow Impedance: 75 Ohm
Implantable Lead Connection Status: 753985
Implantable Lead Connection Status: 753985
Implantable Lead Implant Date: 20180702
Implantable Lead Implant Date: 20180702
Implantable Lead Location: 753859
Implantable Lead Location: 753860
Implantable Lead Model: 5076
Implantable Pulse Generator Implant Date: 20180702
Lead Channel Impedance Value: 399 Ohm
Lead Channel Impedance Value: 456 Ohm
Lead Channel Impedance Value: 532 Ohm
Lead Channel Pacing Threshold Amplitude: 0.5 V
Lead Channel Pacing Threshold Amplitude: 0.625 V
Lead Channel Pacing Threshold Pulse Width: 0.4 ms
Lead Channel Pacing Threshold Pulse Width: 0.4 ms
Lead Channel Sensing Intrinsic Amplitude: 3.25 mV
Lead Channel Sensing Intrinsic Amplitude: 3.25 mV
Lead Channel Sensing Intrinsic Amplitude: 6.875 mV
Lead Channel Sensing Intrinsic Amplitude: 6.875 mV
Lead Channel Setting Pacing Amplitude: 1.5 V
Lead Channel Setting Pacing Amplitude: 2 V
Lead Channel Setting Pacing Pulse Width: 0.4 ms
Lead Channel Setting Sensing Sensitivity: 0.3 mV
Zone Setting Status: 755011
Zone Setting Status: 755011

## 2022-12-11 DIAGNOSIS — L209 Atopic dermatitis, unspecified: Secondary | ICD-10-CM | POA: Diagnosis not present

## 2022-12-11 DIAGNOSIS — E119 Type 2 diabetes mellitus without complications: Secondary | ICD-10-CM | POA: Diagnosis not present

## 2022-12-13 ENCOUNTER — Ambulatory Visit: Payer: 59 | Admitting: Cardiovascular Disease

## 2022-12-21 NOTE — Progress Notes (Signed)
Remote ICD transmission.   

## 2022-12-27 DIAGNOSIS — G4733 Obstructive sleep apnea (adult) (pediatric): Secondary | ICD-10-CM | POA: Diagnosis not present

## 2022-12-28 DIAGNOSIS — L209 Atopic dermatitis, unspecified: Secondary | ICD-10-CM | POA: Diagnosis not present

## 2022-12-28 DIAGNOSIS — E119 Type 2 diabetes mellitus without complications: Secondary | ICD-10-CM | POA: Diagnosis not present

## 2023-01-02 ENCOUNTER — Ambulatory Visit: Payer: 59 | Admitting: Family

## 2023-01-12 ENCOUNTER — Ambulatory Visit: Payer: 59 | Admitting: Family

## 2023-01-12 ENCOUNTER — Encounter: Payer: Self-pay | Admitting: Family

## 2023-01-12 DIAGNOSIS — M170 Bilateral primary osteoarthritis of knee: Secondary | ICD-10-CM | POA: Diagnosis not present

## 2023-01-12 DIAGNOSIS — G8929 Other chronic pain: Secondary | ICD-10-CM

## 2023-01-12 DIAGNOSIS — M25511 Pain in right shoulder: Secondary | ICD-10-CM

## 2023-01-12 MED ORDER — METHYLPREDNISOLONE ACETATE 40 MG/ML IJ SUSP
40.0000 mg | INTRAMUSCULAR | Status: AC | PRN
Start: 2023-01-12 — End: 2023-01-12
  Administered 2023-01-12: 40 mg via INTRA_ARTICULAR

## 2023-01-12 MED ORDER — LIDOCAINE HCL 1 % IJ SOLN
5.0000 mL | INTRAMUSCULAR | Status: AC | PRN
Start: 2023-01-12 — End: 2023-01-12
  Administered 2023-01-12: 5 mL

## 2023-01-12 NOTE — Progress Notes (Signed)
Office Visit Note   [name]           Date of Birth: September 15, 1955           MRN: [id number] Visit Date: 01/12/2023              Requested by: No referring provider defined for this encounter. PCP: Pcp, No  Chief Complaint  Patient presents with   Right Knee - Pain   Left Knee - Pain   Right Shoulder - Pain      HPI: The patient is a 67 year old gentleman seen today in routine follow-up for bilateral knee osteoarthritis as well as chronic right shoulder pain.  No changes  Has been satisfied with his current treatment with Depo-Medrol injection for interval relief of pain.   Last injection was April of this year for knees. Assessment & Plan: Visit Diagnoses: No diagnosis found.  Plan: Depo-Medrol injection bilateral knees and right shoulder.  Patient tolerated well.  Follow-up as needed.  Follow-Up Instructions: No follow-ups on file.   Ortho Exam  Patient is alert, oriented, no adenopathy, well-dressed, normal affect, normal respiratory effort. On examination of the right shoulder patient has abduction and flexion about 90 degrees.  Crepitation with range of motion.  He has good internal and external rotation with passive range of motion to 120 degrees.  No adhesive capsulitis.   Bilateral knees without edema or erythema there is medial joint line tenderness.  Collaterals and cruciates are stable.  Imaging: No results found. No images are attached to the encounter.  Labs: Lab Results  Component Value Date   HGBA1C 8.7 (H) 10/19/2020   HGBA1C 9.8 (H) 09/08/2020   HGBA1C 11.0 (A) 07/23/2020   ESRSEDRATE 11 03/11/2019   CRP 1.8 (H) 03/11/2019   REPTSTATUS 10/04/2020 FINAL 09/29/2020   GRAMSTAIN  07/19/2016    RARE WBC PRESENT, PREDOMINANTLY MONONUCLEAR NO ORGANISMS SEEN    CULT  09/29/2020    NO GROWTH 5 DAYS Performed at [facility], [address], [place], [place] [address]      Lab Results  Component Value Date   ALBUMIN 3.5  09/29/2020   ALBUMIN 4.2 04/26/2020   ALBUMIN 3.7 03/11/2019    No results found for: "MG" No results found for: "VD25OH"  No results found for: "PREALBUMIN"    Latest Ref Rng & Units 10/19/2020   10:59 AM 10/01/2020    5:08 AM 09/30/2020    4:56 AM  CBC EXTENDED  WBC 4.0 - 10.5 K/uL 8.0  7.4  8.4   RBC 4.22 - 5.81 MIL/uL 4.45  4.32  3.99   Hemoglobin 13.0 - 17.0 g/dL 84.1  32.4  40.1   HCT 39.0 - 52.0 % 42.3  40.1  36.6   Platelets 150 - 400 K/uL 183  202  170      There is no height or weight on file to calculate BMI.  Orders:  No orders of the defined types were placed in this encounter.  No orders of the defined types were placed in this encounter.    Procedures: Large Joint Inj: bilateral knee on 01/12/2023 10:13 AM Indications: pain Details: 18 G 1.5 in needle, anteromedial approach Medications (Right): 5 mL lidocaine 1 %; 40 mg methylPREDNISolone acetate 40 MG/ML Medications (Left): 5 mL lidocaine 1 %; 40 mg methylPREDNISolone acetate 40 MG/ML Consent was given by the patient.    Large Joint Inj: R subacromial bursa on 01/12/2023 10:13 AM Indications: pain Details: 22 G  1.5 in needle Medications: 5 mL lidocaine 1 %; 40 mg methylPREDNISolone acetate 40 MG/ML Consent was given by the patient.      Clinical Data: No additional findings.  ROS:  All other systems negative, except as noted in the HPI. Review of Systems  Objective: Vital Signs: There were no vitals taken for this visit.  Specialty Comments:  Ct Myelogram  IMPRESSION:  1. L5-S1: Severe multifactorial spinal stenosis. Advanced bilateral  facet arthropathy with 5 mm of anterolisthesis which increases to 8  mm with standing and flexion. Bulging of the disc. 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 Marked constriction of the thecal sac  due to these processes in combination with abundant epidural fat.  Severe  bilateral foraminal stenosis could compress either or both  exiting L5 nerves.  2. L4-5: Moderate multifactorial spinal stenosis.  3. T12-L1: Right foraminal encroachment by osteophyte could possibly  affect the right T12 nerve.    Aortic Atherosclerosis (ICD10-I70.0).      Electronically Signed    By: Paulina Fusi M.D.    On: 06/29/2020 12:39  PMFS History: Patient Active Problem List   Diagnosis Date Noted   Diabetic neuropathy ([name]) 10/31/2021   Pain due to onychomycosis of toenails of both feet 01/21/2021   Phimosis of penis 10/06/2020   Cellulitis 09/29/2020   Cellulitis of right toe 09/29/2020   Chronic combined systolic and diastolic heart failure ([name]) 09/15/2020   Onychomycosis of toenail 08/24/2020   Diabetes mellitus ([name]) 07/23/2020   Type 2 diabetes mellitus with hyperglycemia, without long-term current use of insulin ([name]) 07/23/2020   Weight gain 07/23/2020   Type 2 diabetes mellitus with peripheral neuropathy ([name]) 07/23/2020   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 diabetes mellitus ([name]) 05/04/2020   Hypertensive heart disease with heart failure ([name]) 04/26/2020   Type 2 diabetes mellitus with diabetic peripheral angiopathy without gangrene, without long-term current use of insulin ([name]) 04/26/2020   OSA on CPAP 04/26/2020   PAD (peripheral artery disease) ([name]) 04/26/2020   CAD in native artery 04/26/2020   Class 2 severe obesity due to excess calories with serious comorbidity and body mass index (BMI) of 38.0 to 38.9 in adult [facility]) 04/26/2020   Impingement syndrome of right shoulder    Nontraumatic complete tear of right rotator cuff    Type 2 diabetes mellitus with foot ulcer, without long-term current use of insulin ([name])    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   S/P debridement 07/19/2016   Acute gout of left elbow 07/19/2016   Past Medical History:  Diagnosis Date   5 years ago    AICD (automatic cardioverter/defibrillator) present     Anxiety    Arthritis    Cataract    CHF (congestive heart failure) ([name])    Coronary artery disease    Diabetes mellitus without complication ([name])    Diabetic retinopathy ([name])    Dysrhythmia    GERD (gastroesophageal reflux disease)    PMH   Hypertension    Hypertensive retinopathy    Peripheral vascular disease ([name])    Peripheral vascular disease ([name])    Pneumonia    Shoulder impingement, right    Sleep apnea    wears cpap   Stomach ulcer    Wears glasses     Family History  Problem Relation Age of Onset   Heart disease Mother    Diabetes Mother  Peripheral vascular disease Mother     Past Surgical History:  Procedure Laterality Date   AMPUTATION Left 03/12/2019   Procedure: 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;  Surgeon: [name], MD;  Location: [facility] OR;  Service: Orthopedics;  Laterality: Left;   AMPUTATION Right 10/01/2020   Procedure: RIGHT 2nd TOE AMPUTATION;  Surgeon: [name], DPM;  Location: [facility] OR;  Service: Podiatry;  Laterality: Right;   CARDIAC CATHETERIZATION     CARDIAC DEFIBRILLATOR PLACEMENT     CIRCUMCISION N/A 11/05/2020   Procedure: CIRCUMCISION ADULT;  Surgeon: [name], MD;  Location: [facility] ORS;  Service: Urology;  Laterality: N/A;   I & D EXTREMITY Left 07/19/2016   Procedure: IRRIGATION AND DEBRIDEMENT EXTREMITY/OLECRANON(WASHOUT);  Surgeon: Tarry Kos, MD;  Location: [facility] OR;  Service: Orthopedics;  Laterality: Left;   OLECRANON BURSECTOMY Left 07/19/2016   Procedure: LEFT ELBOW OLECRANON BURSECTOMY;  Surgeon: Tarry Kos, MD;  Location: [facility];  Service: Orthopedics;  Laterality: Left;   SHOULDER ARTHROSCOPY Right 05/16/2019   Procedure: Right Shoulder Arthroscopy;  Surgeon: [name], MD;  Location: [facility] OR;  Service: Orthopedics;  Laterality: Right;   Social History   Occupational History   Not on file  Tobacco Use   Smoking status: Former    Current packs/day: 0.00    Average packs/day: 1 pack/day for 40.0 years  (40.0 ttl pk-yrs)    Types: Cigarettes    Start date: 03/11/1979    Quit date: 03/11/2019    Years since quitting: 3.8   Smokeless tobacco: Never  Vaping Use   Vaping status: Never Used  Substance and Sexual Activity   Alcohol use: Not Currently   Drug use: Not Currently    Types: Oxycodone   Sexual activity: Not on file

## 2023-01-31 DIAGNOSIS — N476 Balanoposthitis: Secondary | ICD-10-CM | POA: Diagnosis not present

## 2023-02-06 DIAGNOSIS — L209 Atopic dermatitis, unspecified: Secondary | ICD-10-CM | POA: Diagnosis not present

## 2023-02-06 DIAGNOSIS — E119 Type 2 diabetes mellitus without complications: Secondary | ICD-10-CM | POA: Diagnosis not present

## 2023-02-14 ENCOUNTER — Encounter: Payer: Self-pay | Admitting: Podiatry

## 2023-02-14 ENCOUNTER — Ambulatory Visit (INDEPENDENT_AMBULATORY_CARE_PROVIDER_SITE_OTHER): Payer: 59 | Admitting: Podiatry

## 2023-02-14 DIAGNOSIS — S90821A Blister (nonthermal), right foot, initial encounter: Secondary | ICD-10-CM

## 2023-02-14 NOTE — Progress Notes (Signed)
Subjective:  Patient ID: Jon Jones, male    DOB: 11-14-1955,  MRN: [id number]  Chief Complaint  Patient presents with   Diabetes    PATIENT STATES HE HAS HAD SOMETHING ON HIS 5TH TOE ON HIS RIGHT FOOT FOR ABOUT 4 TO 5 MONTHS , NO PAIN PATIENT STATES.     67 y.o. male presents with the above complaint.  Patient presents with complaint of right fifth digit superficial blister.  Patient states that he wore 1 palpation started to heal but wanted to get evaluated does not want to use any more toes.  He is a diabetic denies any other acute complaints.   Review of Systems: Negative except as noted in the HPI. Denies N/V/F/Ch.  Past Medical History:  Diagnosis Date   5 years ago    AICD (automatic cardioverter/defibrillator) present    Anxiety    Arthritis    Cataract    CHF (congestive heart failure) ([name])    Coronary artery disease    Diabetes mellitus without complication ([name])    Diabetic retinopathy ([name])    Dysrhythmia    GERD (gastroesophageal reflux disease)    PMH   Hypertension    Hypertensive retinopathy    Peripheral vascular disease ([name])    Peripheral vascular disease ([name])    Pneumonia    Shoulder impingement, right    Sleep apnea    wears cpap   Stomach ulcer    Wears glasses     Current Outpatient Medications:    ACCU-CHEK AVIVA PLUS test strip, TEST 3 TIMES DAILY, Disp: 100 strip, Rfl: 3   Accu-Chek Softclix Lancets lancets, CHECK BLOOD SUGAR ONCE DAILY, Disp: 100 each, Rfl: 2   acetaminophen (TYLENOL) 325 MG tablet, Take 2 tablets (650 mg total) by mouth every 6 (six) hours as needed for mild pain (or Fever >/= 101)., Disp: , Rfl:    aspirin 81 MG EC tablet, Take 81 mg by mouth daily., Disp: , Rfl:    atorvastatin (LIPITOR) 80 MG tablet, Take 1 tablet (80 mg total) by mouth every evening., Disp: 90 tablet, Rfl: 3   ciclopirox (PENLAC) 8 % solution, Apply topically at bedtime. Apply over nail and surrounding skin. Apply daily over previous coat. After seven  (7) days, may remove with alcohol and continue cycle., Disp: 6.6 mL, Rfl: 3   clobetasol cream (TEMOVATE) 0.05 %, Apply 1 application topically 2 (two) times daily., Disp: 30 g, Rfl: 0   clopidogrel (PLAVIX) 75 MG tablet, TAKE 1 TABLET BY MOUTH EVERY DAY, Disp: 90 tablet, Rfl: 3   empagliflozin (JARDIANCE) 25 MG TABS tablet, TAKE 1 TABLET(25 MG) BY MOUTH AT BEDTIME, Disp: 90 tablet, Rfl: 0   gabapentin (NEURONTIN) 300 MG capsule, TAKE 1 CAPSULE(300 MG) BY MOUTH AT BEDTIME, Disp: 30 capsule, Rfl: 1   glipiZIDE (GLUCOTROL) 10 MG tablet, TAKE 2 TABLETS(20 MG) BY MOUTH TWICE DAILY, Disp: 360 tablet, Rfl: 2   JANUMET XR [id number] MG TB24, Take 1 tablet by mouth daily., Disp: , Rfl:    lisinopril (ZESTRIL) 10 MG tablet, Take 10 mg by mouth daily., Disp: , Rfl:    metoprolol tartrate (LOPRESSOR) 100 MG tablet, Take 100 mg by mouth 2 (two) times daily., Disp: , Rfl:    Misc. Devices MISC, Cpap supplies-tubing mask Dx G47.33 and Z99.89, Disp: 1 Device, Rfl: 0   MOUNJARO 10 MG/0.5ML Pen, Inject 10 mg into the skin once a week., Disp: , Rfl:    nitroGLYCERIN (NITROSTAT) 0.4 MG SL tablet, PLACE 1  TABLET UNDER THE TONGUE EVERY 5 MINS AS NEEDED FOR CHEST PAIN. MAXIMUM OF 3 DOSES, Disp: 25 tablet, Rfl: 3   nystatin cream (MYCOSTATIN), SMARTSIG:Sparingly Topical 2-3 Times Daily PRN, Disp: , Rfl:    RESTASIS 0.05 % ophthalmic emulsion, Place 1 drop into both eyes in the morning and at bedtime., Disp: , Rfl:    sacubitril-valsartan (ENTRESTO) 24-26 MG, Take 1 tablet by mouth 2 (two) times daily., Disp: 60 tablet, Rfl: 11   Semaglutide,0.25 or 0.5MG /DOS, (OZEMPIC, 0.25 OR 0.5 MG/DOSE,) 2 MG/1.5ML SOPN, Inject 0.25 mg into the skin once a week., Disp: , Rfl:    silver sulfADIAZINE (SILVADENE) 1 % cream, Apply 1 Application topically daily., Disp: 50 g, Rfl: 0   spironolactone (ALDACTONE) 25 MG tablet, Take 0.5 tablets (12.5 mg total) by mouth every other day., Disp: 45 tablet, Rfl: 2   terbinafine (LAMISIL) 250 MG  tablet, Take 1 tablet by mouth daily., Disp: , Rfl:    VENTOLIN HFA 108 (90 Base) MCG/ACT inhaler, INHALE 2 PUFFS BY MOUTH EVERY 6 HOURS AS NEEDED FOR WHEEZING OR SHORTNESS OF BREATH, Disp: 18 g, Rfl: 0  Social History   Tobacco Use  Smoking Status Former   Current packs/day: 0.00   Average packs/day: 1 pack/day for 40.0 years (40.0 ttl pk-yrs)   Types: Cigarettes   Start date: 03/11/1979   Quit date: 03/11/2019   Years since quitting: 3.9  Smokeless Tobacco Never    No Known Allergies Objective:  There were no vitals filed for this visit. There is no height or weight on file to calculate BMI. Constitutional Well developed. Well nourished.  Vascular Dorsalis pedis pulses palpable bilaterally. Posterior tibial pulses palpable bilaterally. Capillary refill normal to all digits.  No cyanosis or clubbing noted. Pedal hair growth normal.  Neurologic Normal speech. Oriented to person, place, and time. Epicritic sensation to light touch grossly present bilaterally.  Dermatologic Right superficial ulceration limited to the breakdown of the skin.  Does not probe down to deep tissue.  Blister formation noted.  No purulent drainage noted no erythema noted.  Orthopedic: Normal joint ROM without pain or crepitus bilaterally. No visible deformities. No bony tenderness.   Radiographs: None Assessment:   1. Friction blister of the foot, right, initial encounter    Plan:  Patient was evaluated and treated and all questions answered.  Right fifth digit superficial ulceration mostly healed -All questions and concerns were discussed with the patient extensive detail -I discussed shoe gear modification extensive detail.  At this time no concern for infection noted if any foot and ankle issues arises in the future he will come back and see me.  Encouraged him to use a keep it covered with a triple antibiotic and a Band-Aid until it heals.  No follow-ups on file.

## 2023-02-15 ENCOUNTER — Ambulatory Visit: Payer: 59 | Admitting: Podiatry

## 2023-03-12 DIAGNOSIS — E119 Type 2 diabetes mellitus without complications: Secondary | ICD-10-CM | POA: Diagnosis not present

## 2023-03-12 DIAGNOSIS — L209 Atopic dermatitis, unspecified: Secondary | ICD-10-CM | POA: Diagnosis not present

## 2023-03-21 ENCOUNTER — Encounter: Payer: Self-pay | Admitting: Cardiovascular Disease

## 2023-03-21 ENCOUNTER — Ambulatory Visit: Payer: 59 | Attending: Cardiovascular Disease | Admitting: Cardiovascular Disease

## 2023-03-21 DIAGNOSIS — I252 Old myocardial infarction: Secondary | ICD-10-CM | POA: Diagnosis not present

## 2023-03-21 DIAGNOSIS — G4733 Obstructive sleep apnea (adult) (pediatric): Secondary | ICD-10-CM | POA: Diagnosis not present

## 2023-03-21 DIAGNOSIS — I255 Ischemic cardiomyopathy: Secondary | ICD-10-CM | POA: Diagnosis not present

## 2023-03-21 DIAGNOSIS — I2510 Atherosclerotic heart disease of native coronary artery without angina pectoris: Secondary | ICD-10-CM

## 2023-03-21 DIAGNOSIS — E785 Hyperlipidemia, unspecified: Secondary | ICD-10-CM

## 2023-03-21 DIAGNOSIS — E1142 Type 2 diabetes mellitus with diabetic polyneuropathy: Secondary | ICD-10-CM | POA: Diagnosis not present

## 2023-03-21 DIAGNOSIS — I5042 Chronic combined systolic (congestive) and diastolic (congestive) heart failure: Secondary | ICD-10-CM | POA: Diagnosis not present

## 2023-03-21 DIAGNOSIS — Z95810 Presence of automatic (implantable) cardiac defibrillator: Secondary | ICD-10-CM

## 2023-03-21 NOTE — Addendum Note (Signed)
 Addended by: Shon Hale on: 03/21/2023 03:47 PM   Modules accepted: Orders

## 2023-03-21 NOTE — Progress Notes (Signed)
 Cardiology Office Note    Date:  03/21/2023   ID:  Jon Jones, DOB 1955/07/18, MRN [id number]  PCP:  Pcp, No  Cardiologist:  Debby Sor, MD (sleep); Dr. Susanne Dr. Francyne  44-month follow-up sleep evaluation   History of Present Illness:  Jon Jones is a 68 y.o. male who had been followed by Dr. Lonni Nanas for his cardiology care and more recently has seen Dr. Francyne.  I saw him for my initial sleep evaluation on December 14, 2020 and last saw him in March 2023.   Jon Jones has known CAD and suffered a myocardial infarction in 2016.  Due to resultant ischemic cardiomyopathy he underwent ICD implantation.  He has a history of type 2 diabetes mellitus, PAD, hyperlipidemia, as well as obstructive sleep apnea.  He was previously followed with sleep medicine in [place] and now that he is in the [place] area he was referred to me to establish sleep medicine care in [place].  He had undergone a sleep study while living in [place] years ago and was told of stopping breathing approximately 90 times per hour.  He was initially set on CPAP therapy at maximal 20 cm of water.  Apparently his DME company was [employer]  He has not had any recent downloads.  Over the past month he has had problems with his mask fitting and he has noticed more leak.  He admits to 100% compliance with CPAP therapy.  He typically goes to bed between 11 PM and midnight and wakes up at 9 AM.  He has nocturia approximately 1-2 times per night but otherwise believes he is sleeping well.  He is on Medicaid for insurance.    When I saw him for my initial evaluation, I gave him a new ResMed AirFit F 30 mask which we had as a sample.  Since it has been many years since his initial evaluation and with his previous high pressure requirement I recommended a reevaluation prior to obtaining a new CPAP machine.  I discussed with him potential adverse consequences of untreated sleep apnea on his cardiovascular health and  he recognized the importance of continued use and continued excellent compliance.  He underwent a split-night sleep study on February 10, 2021.  On the diagnostic portion of the study AHI was consistent with severe sleep apnea at 31.8.  However events were very severe during REM sleep with an AHI of 60.  He had significant oxygen desaturation to a nadir of 67%.  CPAP was initiated at 5 cm and was titrated to optimal PAP pressure of 12 cm of water her AHI was 0 and O2 nadir was 90%.  Mr. Jon Jones received a new ResMed AirSense 11 AutoSet CPAP unit on March 10, 2021.  I saw him on March 21, 2021.  Over the past week he has been on therapy with excellent compliance.  Average use is 6 hours and 10 minutes per night.  AHI, however was increased at 9.3/h.  He states he typically goes to bed between 1030 and 11 PM and wakes up around 9 AM.  Oftentimes he goes to the bathroom at 5 AM and has not put the most mask back on.  He has not slept very well for the latter portion of the early morning.  During that evaluation, I discussed with him that the preponderance of REM sleep occurs in the second half of the night and since he has been taking his mask off when going to the bathroom and  not putting back on he was not on therapy will most likely need at the most.  With his AHI at 9.3/h, I changed his 12 cm set pressure to an auto mode range at 12 to 20 cm of water.  I last saw him on May 30, 2021.  At that time he was using CPAP with limited compliance.  A download was obtained on his new machine from February 14 through May 25, 2021; average usage on days used was 6 hours and 29 minutes.  However usage days with his new machine was only 8 out of 30.  He states for the last 3 weeks he has been using his old machine which is why it is not registered on this download.  He believes his sleep has improved with increased duration of treatment.  He has experienced some episodes of lightheadedness.  He has been on Entresto   24/26 mg twice a day, metoprolol  succinate 100 mg daily, and spironolactone  12.5 mg daily.  He has continued to be on DAPT with aspirin  and Plavix .  He is on Jardiance  with his reduced LV function and glipizide  and Ozempic for his diabetes.     Since I last saw him, he has not seen Dr. Kate.  He has been followed by Dr. Francyne last saw him on 08/05/2022.  Apparently, the patient also reestablished with a new primary care physician, Dr. Imogene at [facility].  When he last saw Dr. Francyne, ICD function was normal with estimated generator longevity at 3.5 years.  At the time, he was was on empagliflozin , metoprolol , Entresto , spironolactone  with his reduced LV function.  Blood pressure was stable but on the low side.  On review of the patient's medications today, he is on aspirin /Plavix  for DAPT, Jardiance  25 mg, Entresto  24/26 mg twice a day, but he also is continuing to take lisinopril  10 mg daily and apparently his primary care doctor changed his metoprolol  succinate to metoprolol   tartrate 100 mg twice a day.  He is on spironolactone  12.5 mg every other day.  He is diabetic on Janumet  XR 100/1000 mg daily, glipizide  20 mg twice a day and Ozempic weekly.  From a sleep perspective, the patient states he had traveled up to [place]  to visit family.  Apparently, his use of CPAP has been limited download from Tober 10 through January 19, 2023 showed usage days at 11 out of 30 with average use at 6 hours 16 minutes.  His CPAP is set at a pressure range of 12-20 and AHI is 4.1 with 95th percentile pressure 14.6.  A download from November 9 through February 18, 2023 showed usage at only 4 of 30 days with AHI 3.2 90/5% pressure 14.6 with maximum average pressure 15.8.  His most recent download from December 9 to January 7 only 4 days of use with no use in December and only the last 4 days.  AHI is 2.3 with his 95th percentile pressure 13.6 and average use was only 4 hours and 19 minutes.  The patient states he  often would take him several weeks to take his CPAP out of his luggage is why he would not use the treatment.  He presents for evaluation.  Past Medical History:  Diagnosis Date   5 years ago    AICD (automatic cardioverter/defibrillator) present    Anxiety    Arthritis    Cataract    CHF (congestive heart failure) ([name])    Coronary artery disease    Diabetes mellitus without complication (  [name])    Diabetic retinopathy ([name])    Dysrhythmia    GERD (gastroesophageal reflux disease)    PMH   Hypertension    Hypertensive retinopathy    Peripheral vascular disease ([name])    Peripheral vascular disease ([name])    Pneumonia    Shoulder impingement, right    Sleep apnea    wears cpap   Stomach ulcer    Wears glasses     Past Surgical History:  Procedure Laterality Date   AMPUTATION Left 03/12/2019   Procedure: 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;  Surgeon: Harden Jerona GAILS, MD;  Location[facility] OR;  Service: Orthopedics;  Laterality: Left;   AMPUTATION Right 10/01/2020   Procedure: RIGHT 2nd TOE AMPUTATION;  Surgeon: Tobie Franky SQUIBB, DPM;  Location: [facility] OR;  Service: Podiatry;  Laterality: Right;   CARDIAC CATHETERIZATION     CARDIAC DEFIBRILLATOR PLACEMENT     CIRCUMCISION N/A 11/05/2020   Procedure: CIRCUMCISION ADULT;  Surgeon: Watt Rush, MD;  Location: [facility] ORS;  Service: Urology;  Laterality: N/A;   I & D EXTREMITY Left 07/19/2016   Procedure: IRRIGATION AND DEBRIDEMENT EXTREMITY/OLECRANON(WASHOUT);  Surgeon: Jerri Kay HERO, MD;  Location[facility] OR;  Service: Orthopedics;  Laterality: Left;   OLECRANON BURSECTOMY Left 07/19/2016   Procedure: LEFT ELBOW OLECRANON BURSECTOMY;  Surgeon: Jerri Kay HERO, MD;  Location: [facility];  Service: Orthopedics;  Laterality: Left;   SHOULDER ARTHROSCOPY Right 05/16/2019   Procedure: Right Shoulder Arthroscopy;  Surgeon: Harden Jerona GAILS, MD;  Location: [facility] OR;  Service: Orthopedics;  Laterality: Right;    Current Medications: Outpatient Medications Prior to  Visit  Medication Sig Dispense Refill   ACCU-CHEK AVIVA PLUS test strip TEST 3 TIMES DAILY 100 strip 3   Accu-Chek Softclix Lancets lancets CHECK BLOOD SUGAR ONCE DAILY 100 each 2   acetaminophen  (TYLENOL ) 325 MG tablet Take 2 tablets (650 mg total) by mouth every 6 (six) hours as needed for mild pain (or Fever >/= 101).     aspirin  81 MG EC tablet Take 81 mg by mouth daily.     atorvastatin  (LIPITOR ) 80 MG tablet Take 1 tablet (80 mg total) by mouth every evening. 90 tablet 3   ciclopirox  (PENLAC ) 8 % solution Apply topically at bedtime. Apply over nail and surrounding skin. Apply daily over previous coat. After seven (7) days, may remove with alcohol and continue cycle. 6.6 mL 3   clobetasol  cream (TEMOVATE ) 0.05 % Apply 1 application topically 2 (two) times daily. 30 g 0   clopidogrel  (PLAVIX ) 75 MG tablet TAKE 1 TABLET BY MOUTH EVERY DAY 90 tablet 3   empagliflozin  (JARDIANCE ) 25 MG TABS tablet TAKE 1 TABLET(25 MG) BY MOUTH AT BEDTIME 90 tablet 0   gabapentin  (NEURONTIN ) 300 MG capsule TAKE 1 CAPSULE(300 MG) BY MOUTH AT BEDTIME 30 capsule 1   glipiZIDE  (GLUCOTROL ) 10 MG tablet TAKE 2 TABLETS(20 MG) BY MOUTH TWICE DAILY 360 tablet 2   JANUMET  XR [phone] MG TB24 Take 1 tablet by mouth daily.     metoprolol  tartrate (LOPRESSOR ) 100 MG tablet Take 100 mg by mouth 2 (two) times daily.     Misc. Devices MISC Cpap supplies-tubing mask Dx G47.33 and Z99.89 1 Device 0   MOUNJARO 10 MG/0.5ML Pen Inject 10 mg into the skin once a week.     nitroGLYCERIN  (NITROSTAT ) 0.4 MG SL tablet PLACE 1 TABLET UNDER THE TONGUE EVERY 5 MINS AS NEEDED FOR CHEST PAIN. MAXIMUM OF 3 DOSES 25 tablet 3  nystatin cream (MYCOSTATIN) SMARTSIG:Sparingly Topical 2-3 Times Daily PRN     RESTASIS  0.05 % ophthalmic emulsion Place 1 drop into both eyes in the morning and at bedtime.     sacubitril -valsartan  (ENTRESTO ) 24-26 MG Take 1 tablet by mouth 2 (two) times daily. 60 tablet 11   Semaglutide,0.25 or 0.5MG /DOS, (OZEMPIC,  0.25 OR 0.5 MG/DOSE,) 2 MG/1.5ML SOPN Inject 0.25 mg into the skin once a week.     silver  sulfADIAZINE  (SILVADENE ) 1 % cream Apply 1 Application topically daily. 50 g 0   spironolactone  (ALDACTONE ) 25 MG tablet Take 0.5 tablets (12.5 mg total) by mouth every other day. 45 tablet 2   terbinafine (LAMISIL) 250 MG tablet Take 1 tablet by mouth daily.     VENTOLIN  HFA 108 (90 Base) MCG/ACT inhaler INHALE 2 PUFFS BY MOUTH EVERY 6 HOURS AS NEEDED FOR WHEEZING OR SHORTNESS OF BREATH 18 g 0   lisinopril  (ZESTRIL ) 10 MG tablet Take 10 mg by mouth daily.     No facility-administered medications prior to visit.     Allergies:   Patient has no known allergies.   Social History   Socioeconomic History   Marital status: Married    Spouse name: Not on file   Number of children: Not on file   Years of education: Not on file   Highest education level: Not on file  Occupational History   Not on file  Tobacco Use   Smoking status: Former    Current packs/day: 0.00    Average packs/day: 1 pack/day for 40.0 years (40.0 ttl pk-yrs)    Types: Cigarettes    Start date: 03/11/1979    Quit date: 03/11/2019    Years since quitting: 4.0   Smokeless tobacco: Never  Vaping Use   Vaping status: Never Used  Substance and Sexual Activity   Alcohol use: Not Currently   Drug use: Not Currently    Types: Oxycodone    Sexual activity: Not on file  Other Topics Concern   Not on file  Social History Narrative   Not on file   Social Drivers of Health   Financial Resource Strain: Not on file  Food Insecurity: Not on file  Transportation Needs: Not on file  Physical Activity: Not on file  Stress: Not on file  Social Connections: Not on file    Socially, he was born in [place], [place].  He has lived in multiple states including [place] , [place] , [place] , and more recently [place] .  He is married has 3 children and 2 grandchildren.  He had worked in surveyor, quantity.  He does not drink  alcohol or use drugs.  He does do occasional aerobic exercise.   Family History:  The patient's family history includes Diabetes in his mother; Heart disease in his mother; Peripheral vascular disease in his mother.  Both parents are deceased.  He has 2 brothers ages 45 and 6 and no sisters.  His children are 65, 34, and 29.  ROS General: Negative; No fevers, chills, or night sweats;  HEENT: Negative; No changes in vision or hearing, sinus congestion, difficulty swallowing Pulmonary: Negative; No cough, wheezing, shortness of breath, hemoptysis Cardiovascular: Prior inferior MI, ischemic cardiomyopathy, status post ICD implantation July 2018. GI: Negative; No nausea, vomiting, diarrhea, or abdominal pain GU: Negative; No dysuria, hematuria, or difficulty voiding Musculoskeletal: Negative; no myalgias, joint pain, or weakness Hematologic/Oncology: Negative; no easy bruising, bleeding Endocrine: Negative; no heat/cold intolerance; no diabetes Neuro: Negative; no changes in balance, headaches Skin:  Negative; No rashes or skin lesions Psychiatric: Negative; No behavioral problems, depression Sleep: See HPI.   Other comprehensive 14 point system review is negative.   PHYSICAL EXAM:   VS:  BP 104/68   Pulse 77   Ht 5' 6 (1.676 m)   Wt 240 lb 6.4 oz (109 kg)   SpO2 97%   BMI 38.80 kg/m     Repeat blood pressure by me was 102/70.  Wt Readings from Last 3 Encounters:  03/21/23 240 lb 6.4 oz (109 kg)  10/05/22 233 lb 12.8 oz (106.1 kg)  07/03/22 227 lb (103 kg)    General: Alert, oriented, no distress.  Skin: normal turgor, no rashes, warm and dry HEENT: Normocephalic, atraumatic. Pupils equal round and reactive to light; sclera anicteric; extraocular muscles intact;  Nose without nasal septal hypertrophy Mouth/Parynx: large tongue; Mallinpatti scale 3/4 Neck: Thick neck;  No JVD, no carotid bruits; normal carotid upstroke Lungs: clear to ausculatation and percussion; no wheezing  or rales Chest wall: without tenderness to palpitation Heart: PMI not displaced, RRR, s1 s2 normal, 1/6 systolic murmur, no diastolic murmur, no rubs, gallops, thrills, or heaves Abdomen: central adiposity; soft, nontender; no hepatosplenomehaly, BS+; abdominal aorta nontender and not dilated by palpation. Back: no CVA tenderness Pulses 2+ Musculoskeletal: full range of motion, normal strength, no joint deformities Extremities: no clubbing cyanosis or edema, Homan's sign negative  Neurologic: grossly nonfocal; Cranial nerves grossly wnl Psychologic: Normal mood and affect   Studies/Labs Reviewed:   EKG Interpretation Date/Time:  Wednesday March 21 2023 08:57:28 EST Ventricular Rate:  77 PR Interval:  178 QRS Duration:  104 QT Interval:  374 QTC Calculation: 423 R Axis:   38  Text Interpretation: Normal sinus rhythm Low voltage QRS Cannot rule out Inferior infarct (cited on or before 05-Oct-2022) When compared with ECG of 05-Oct-2022 09:46, No significant change was found Confirmed by Burnard Ned ([id number]) on 03/21/2023 9:53:23 AM    March 21, 2021 ECG (independently read by me): NSR at 76, smal Q III  December 14, 2020 ECG (independently read by me):  NSR at 80, no ectopy, QTc 447 msec  Recent Labs:    Latest Ref Rng & Units 01/19/2021    9:46 AM 10/19/2020   10:59 AM 10/01/2020    5:08 AM  BMP  Glucose 70 - 99 mg/dL 812  739  811   BUN 8 - 27 mg/dL 18  20  15    Creatinine 0.76 - 1.27 mg/dL 8.91  9.03  8.86   BUN/Creat Ratio 10 - 24 17     Sodium 134 - 144 mmol/L 138  138  136   Potassium 3.5 - 5.2 mmol/L 4.6  4.5  3.7   Chloride 96 - 106 mmol/L 102  106  104   CO2 20 - 29 mmol/L 22  24  23    Calcium  8.6 - 10.2 mg/dL 89.9  9.7  9.3         Latest Ref Rng & Units 09/29/2020    9:25 AM 04/26/2020   10:02 AM 03/11/2019    7:40 AM  Hepatic Function  Total Protein 6.5 - 8.1 g/dL 7.4  7.2  7.1   Albumin  3.5 - 5.0 g/dL 3.5  4.2  3.7   AST 15 - 41 U/L 26  21  34   ALT 0 - 44  U/L 22  26  51   Alk Phosphatase 38 - 126 U/L 59  75  51   Total Bilirubin  0.3 - 1.2 mg/dL 0.6  0.5  0.4        Latest Ref Rng & Units 10/19/2020   10:59 AM 10/01/2020    5:08 AM 09/30/2020    4:56 AM  CBC  WBC 4.0 - 10.5 K/uL 8.0  7.4  8.4   Hemoglobin 13.0 - 17.0 g/dL 86.7  87.1  87.9   Hematocrit 39.0 - 52.0 % 42.3  40.1  36.6   Platelets 150 - 400 K/uL 183  202  170    Lab Results  Component Value Date   MCV 95.1 10/19/2020   MCV 92.8 10/01/2020   MCV 91.7 09/30/2020   Lab Results  Component Value Date   TSH 1.88 07/23/2020   Lab Results  Component Value Date   HGBA1C 8.7 (H) 10/19/2020     BNP No results found for: BNP  ProBNP No results found for: PROBNP   Lipid Panel     Component Value Date/Time   CHOL 128 01/19/2021 0946   TRIG 199 (H) 01/19/2021 0946   HDL 33 (L) 01/19/2021 0946   CHOLHDL 3.9 01/19/2021 0946   LDLCALC 62 01/19/2021 0946   LABVLDL 33 01/19/2021 0946     RADIOLOGY: No results found.   Additional studies/ records that were reviewed today include:  I reviewed the records of Dr. Lonni Mas.  ECHO; 06/01/2020 IMPRESSIONS   1. Left ventricular ejection fraction, by estimation, is 35 to 40%. The  left ventricle has moderately decreased function. The left ventricle  demonstrates regional wall motion abnormalities (see scoring  diagram/findings for description). There is mild  left ventricular hypertrophy. Left ventricular diastolic parameters were  normal. There is severe akinesis of the left ventricular, basal-mid  inferior wall.   2. Right ventricular systolic function is normal. The right ventricular  size is normal.   3. The mitral valve is normal in structure. No evidence of mitral valve  regurgitation. No evidence of mitral stenosis.   4. The aortic valve is normal in structure. Aortic valve regurgitation is  not visualized.    ZIO PATCH: 06/08/2020 One episode of NSVT lasting 5 beats Rare PVCs (<1% of beats)      Patch Wear Time:  2 days and 22 hours (2022-03-17T01:26:33-0400 to 2022-03-20T00:25:48-0400)   Patient had a min HR of 59 bpm, max HR of 148 bpm, and avg HR of 77 bpm. Predominant underlying rhythm was Sinus Rhythm. 1 run of Ventricular Tachycardia occurred lasting 5 beats with a max rate of 148 bpm (avg 134 bpm). Episode of Ventricular  Tachycardia may be Supraventricular Tachycardia with possible aberrancy. Isolated SVEs were rare (<1.0%), SVE Triplets were rare (<1.0%), and no SVE Couplets were present. Isolated VEs were rare (<1.0%), VE Couplets were rare (<1.0%), and no VE Triplets  were present.  No patient triggered events.  ASSESSMENT:    1. OSA on CPAP   2. CAD in native artery   3. History of myocardial infarction   4. Chronic combined systolic and diastolic heart failure ([name])   5. Ischemic cardiomyopathy   6. ICD (implantable cardioverter-defibrillator) in place   7. Hyperlipidemia with target LDL less than 55   8. Type 2 diabetes mellitus with peripheral neuropathy [facility])     PLAN:  Mr. Kermitt Harjo a very pleasant 68 year-old gentleman who was born in [place] and has significant cardiovascular comorbidities including prior inferior myocardial infarction, subsequent ischemic cardiomyopathy with EF 35%, ICD implantation, history of tobacco, PAD, type 2 diabetes mellitus and hyperlipidemia.  He has been on guideline directed medical therapy for his ischemic cardiomyopathy including Entresto , metoprolol  succinate, aldosterone blockade with spironolactone  and SGLT2 inhibition with Jardiance .  He has continued to be on DAPT with aspirin /Plavix  and is on atorvastatin  80 mg for hyperlipidemia with a lipid panel in February 2022 at 25.  Apparently,  since I last saw him in March 2023, his medication list today indicates that he is also still taking lisinopril  10 mg.  The patient states that he has continued to take this medication since he had prescriptions refilled.  I advised that he  discontinue lisinopril  since he is on Entresto  and this may be contributing to his low blood pressure.  In addition, his new primary care provider apparently switched him off metoprolol  succinate and he is taking metoprolol  tartrate 100 mg twice a day.   From a sleep perspective, CPAP use is suboptimal.  I had a long discussion with him today regarding sleep apnea and particularly with his history of ischemic cardiomyopathy, prior myocardial infarctions, the importance of using CPAP on a daily basis to reduce potential risk of nocturnal arrhythmias, atrial fibrillation, increased inflammation, GERD, glucose issues as well as potential for nocturnal hypoxemia contributing to ischemia.  I will slightly adjust his CPAP pressure to a range of 13 to 20 cm.  I asked if he was still seeing Dr. Kate for primary care and he says that he has not been seeing him and sees Dr. Francyne now.  He will monitor his blood pressure.  I have recommended he have a follow-up appointment to see Dr. Francyne in 6 weeks.  At that time he may be able to be changed from metoprolol  tartrate back to succinate.  I will defer to Dr. Francyne if he will follow him for general cardiology refer him back to Dr. Kate.  I discussed my plans for retirement this year.  Iterated the importance of CPAP compliance.  I will be happy to see him anytime prior to my retirement but subsequent to this he will need to be seen by Dr. Shlomo.    Medication Adjustments/Labs and Tests Ordered: Current medicines are reviewed at length with the patient today.  Concerns regarding medicines are outlined above.  Medication changes, Labs and Tests ordered today are listed in the Patient Instructions below. Patient Instructions  Medication Instructions:  Do not take the Lisinopril  10mg   *If you need a refill on your cardiac medications before your next appointment, please call your pharmacy*   Lab Work: No labs If you have labs (blood work) drawn today  and your tests are completely normal, you will receive your results only by: MyChart Message (if you have MyChart) OR A paper copy in the mail If you have any lab test that is abnormal or we need to change your treatment, we will call you to review the results.   Testing/Procedures: No testing   Follow-Up: At [facility], you and your health needs are our priority.  As part of our continuing mission to provide you with exceptional heart care, we have created designated Provider Care Teams.  These Care Teams include your primary Cardiologist (physician) and Advanced Practice Providers (APPs -  Physician Assistants and Nurse Practitioners) who all work together to provide you with the care you need, when you need it.  We recommend signing up for the patient portal called MyChart.  Sign up information is provided on this After Visit Summary.  MyChart is used to connect with patients for Virtual  Visits (Telemedicine).  Patients are able to view lab/test results, encounter notes, upcoming appointments, etc.  Non-urgent messages can be sent to your provider as well.   To learn more about what you can do with MyChart, go to [email or web address].    Your next appointment:   6 week(s)  Provider:   Dr Jerel Balding     Other Instructions Thank you for choosing [facility]!     If you have any questions or concerns regarding your c-pap, bi-pap or sleep accessories, please contact Brandie Rorie at [phone].       Signed, Debby Sor, MD, [facility], ABSM Diplomate, American Board of Sleep Medicine  03/21/2023 10:24 AM    [facility] [address], [address], [place], [place]  [address] Phone: [phone]

## 2023-03-21 NOTE — Patient Instructions (Signed)
 Medication Instructions:  Do not take the Lisinopril  10mg   *If you need a refill on your cardiac medications before your next appointment, please call your pharmacy*   Lab Work: No labs If you have labs (blood work) drawn today and your tests are completely normal, you will receive your results only by: MyChart Message (if you have MyChart) OR A paper copy in the mail If you have any lab test that is abnormal or we need to change your treatment, we will call you to review the results.   Testing/Procedures: No testing   Follow-Up: At [facility], you and your health needs are our priority.  As part of our continuing mission to provide you with exceptional heart care, we have created designated Provider Care Teams.  These Care Teams include your primary Cardiologist (physician) and Advanced Practice Providers (APPs -  Physician Assistants and Nurse Practitioners) who all work together to provide you with the care you need, when you need it.  We recommend signing up for the patient portal called MyChart.  Sign up information is provided on this After Visit Summary.  MyChart is used to connect with patients for Virtual Visits (Telemedicine).  Patients are able to view lab/test results, encounter notes, upcoming appointments, etc.  Non-urgent messages can be sent to your provider as well.   To learn more about what you can do with MyChart, go to [email or web address].    Your next appointment:   6 week(s)  Provider:   Dr Jerel Balding     Other Instructions Thank you for choosing [facility]!     If you have any questions or concerns regarding your c-pap, bi-pap or sleep accessories, please contact Brandie Rorie at [phone].

## 2023-03-30 ENCOUNTER — Emergency Department (HOSPITAL_BASED_OUTPATIENT_CLINIC_OR_DEPARTMENT_OTHER): Payer: 59

## 2023-03-30 ENCOUNTER — Other Ambulatory Visit: Payer: Self-pay

## 2023-03-30 ENCOUNTER — Inpatient Hospital Stay (HOSPITAL_BASED_OUTPATIENT_CLINIC_OR_DEPARTMENT_OTHER)
Admission: EM | Admit: 2023-03-30 | Discharge: 2023-04-02 | DRG: 872 | Disposition: A | Discharge status: Home / Self Care | Payer: 59 | Attending: Internal Medicine | Admitting: Internal Medicine

## 2023-03-30 ENCOUNTER — Encounter (HOSPITAL_BASED_OUTPATIENT_CLINIC_OR_DEPARTMENT_OTHER): Payer: Self-pay

## 2023-03-30 DIAGNOSIS — M7731 Calcaneal spur, right foot: Secondary | ICD-10-CM | POA: Diagnosis not present

## 2023-03-30 DIAGNOSIS — R7982 Elevated C-reactive protein (CRP): Secondary | ICD-10-CM | POA: Diagnosis present

## 2023-03-30 DIAGNOSIS — E871 Hypo-osmolality and hyponatremia: Secondary | ICD-10-CM | POA: Diagnosis not present

## 2023-03-30 DIAGNOSIS — Z89422 Acquired absence of other left toe(s): Secondary | ICD-10-CM

## 2023-03-30 DIAGNOSIS — Z79899 Other long term (current) drug therapy: Secondary | ICD-10-CM

## 2023-03-30 DIAGNOSIS — Z23 Encounter for immunization: Secondary | ICD-10-CM

## 2023-03-30 DIAGNOSIS — D696 Thrombocytopenia, unspecified: Secondary | ICD-10-CM | POA: Diagnosis not present

## 2023-03-30 DIAGNOSIS — R531 Weakness: Secondary | ICD-10-CM | POA: Diagnosis not present

## 2023-03-30 DIAGNOSIS — E785 Hyperlipidemia, unspecified: Secondary | ICD-10-CM | POA: Diagnosis not present

## 2023-03-30 DIAGNOSIS — I110 Hypertensive heart disease with heart failure: Secondary | ICD-10-CM | POA: Diagnosis not present

## 2023-03-30 DIAGNOSIS — E1165 Type 2 diabetes mellitus with hyperglycemia: Secondary | ICD-10-CM | POA: Diagnosis present

## 2023-03-30 DIAGNOSIS — Z7902 Long term (current) use of antithrombotics/antiplatelets: Secondary | ICD-10-CM

## 2023-03-30 DIAGNOSIS — Z7984 Long term (current) use of oral hypoglycemic drugs: Secondary | ICD-10-CM | POA: Diagnosis not present

## 2023-03-30 DIAGNOSIS — E876 Hypokalemia: Secondary | ICD-10-CM | POA: Diagnosis not present

## 2023-03-30 DIAGNOSIS — K219 Gastro-esophageal reflux disease without esophagitis: Secondary | ICD-10-CM | POA: Diagnosis present

## 2023-03-30 DIAGNOSIS — E119 Type 2 diabetes mellitus without complications: Secondary | ICD-10-CM | POA: Diagnosis not present

## 2023-03-30 DIAGNOSIS — Z8249 Family history of ischemic heart disease and other diseases of the circulatory system: Secondary | ICD-10-CM

## 2023-03-30 DIAGNOSIS — D6959 Other secondary thrombocytopenia: Secondary | ICD-10-CM | POA: Diagnosis not present

## 2023-03-30 DIAGNOSIS — Z87891 Personal history of nicotine dependence: Secondary | ICD-10-CM

## 2023-03-30 DIAGNOSIS — I5022 Chronic systolic (congestive) heart failure: Secondary | ICD-10-CM | POA: Diagnosis not present

## 2023-03-30 DIAGNOSIS — Z7985 Long-term (current) use of injectable non-insulin antidiabetic drugs: Secondary | ICD-10-CM | POA: Diagnosis not present

## 2023-03-30 DIAGNOSIS — Z8711 Personal history of peptic ulcer disease: Secondary | ICD-10-CM

## 2023-03-30 DIAGNOSIS — L03115 Cellulitis of right lower limb: Secondary | ICD-10-CM | POA: Diagnosis present

## 2023-03-30 DIAGNOSIS — E11319 Type 2 diabetes mellitus with unspecified diabetic retinopathy without macular edema: Secondary | ICD-10-CM | POA: Diagnosis not present

## 2023-03-30 DIAGNOSIS — E1140 Type 2 diabetes mellitus with diabetic neuropathy, unspecified: Secondary | ICD-10-CM | POA: Diagnosis not present

## 2023-03-30 DIAGNOSIS — M109 Gout, unspecified: Secondary | ICD-10-CM | POA: Diagnosis present

## 2023-03-30 DIAGNOSIS — A419 Sepsis, unspecified organism: Principal | ICD-10-CM | POA: Diagnosis present

## 2023-03-30 DIAGNOSIS — E872 Acidosis, unspecified: Secondary | ICD-10-CM | POA: Diagnosis present

## 2023-03-30 DIAGNOSIS — G609 Hereditary and idiopathic neuropathy, unspecified: Secondary | ICD-10-CM | POA: Diagnosis not present

## 2023-03-30 DIAGNOSIS — I2510 Atherosclerotic heart disease of native coronary artery without angina pectoris: Secondary | ICD-10-CM | POA: Diagnosis present

## 2023-03-30 DIAGNOSIS — Z6837 Body mass index (BMI) 37.0-37.9, adult: Secondary | ICD-10-CM

## 2023-03-30 DIAGNOSIS — S91109A Unspecified open wound of unspecified toe(s) without damage to nail, initial encounter: Secondary | ICD-10-CM | POA: Diagnosis not present

## 2023-03-30 DIAGNOSIS — I739 Peripheral vascular disease, unspecified: Secondary | ICD-10-CM | POA: Diagnosis present

## 2023-03-30 DIAGNOSIS — Z8679 Personal history of other diseases of the circulatory system: Secondary | ICD-10-CM | POA: Diagnosis not present

## 2023-03-30 DIAGNOSIS — Z7982 Long term (current) use of aspirin: Secondary | ICD-10-CM

## 2023-03-30 DIAGNOSIS — G4733 Obstructive sleep apnea (adult) (pediatric): Secondary | ICD-10-CM | POA: Diagnosis not present

## 2023-03-30 DIAGNOSIS — Z1152 Encounter for screening for COVID-19: Secondary | ICD-10-CM | POA: Diagnosis not present

## 2023-03-30 DIAGNOSIS — E1151 Type 2 diabetes mellitus with diabetic peripheral angiopathy without gangrene: Secondary | ICD-10-CM | POA: Diagnosis present

## 2023-03-30 DIAGNOSIS — L0390 Cellulitis, unspecified: Secondary | ICD-10-CM | POA: Diagnosis not present

## 2023-03-30 DIAGNOSIS — E669 Obesity, unspecified: Secondary | ICD-10-CM | POA: Diagnosis present

## 2023-03-30 DIAGNOSIS — R509 Fever, unspecified: Secondary | ICD-10-CM | POA: Diagnosis not present

## 2023-03-30 DIAGNOSIS — I959 Hypotension, unspecified: Secondary | ICD-10-CM | POA: Diagnosis present

## 2023-03-30 DIAGNOSIS — Z95810 Presence of automatic (implantable) cardiac defibrillator: Secondary | ICD-10-CM

## 2023-03-30 DIAGNOSIS — I255 Ischemic cardiomyopathy: Secondary | ICD-10-CM | POA: Diagnosis not present

## 2023-03-30 DIAGNOSIS — Z833 Family history of diabetes mellitus: Secondary | ICD-10-CM | POA: Diagnosis not present

## 2023-03-30 DIAGNOSIS — G629 Polyneuropathy, unspecified: Secondary | ICD-10-CM

## 2023-03-30 DIAGNOSIS — Z89421 Acquired absence of other right toe(s): Secondary | ICD-10-CM | POA: Diagnosis not present

## 2023-03-30 LAB — CBC WITH DIFFERENTIAL/PLATELET
Abs Immature Granulocytes: 0.05 10*3/uL (ref 0.00–0.07)
Basophils Absolute: 0 10*3/uL (ref 0.0–0.1)
Basophils Relative: 0 %
Eosinophils Absolute: 0 10*3/uL (ref 0.0–0.5)
Eosinophils Relative: 0 %
HCT: 42.5 % (ref 39.0–52.0)
Hemoglobin: 14.6 g/dL (ref 13.0–17.0)
Immature Granulocytes: 1 %
Lymphocytes Relative: 5 %
Lymphs Abs: 0.5 10*3/uL — ABNORMAL LOW (ref 0.7–4.0)
MCH: 31.7 pg (ref 26.0–34.0)
MCHC: 34.4 g/dL (ref 30.0–36.0)
MCV: 92.4 fL (ref 80.0–100.0)
Monocytes Absolute: 0.6 10*3/uL (ref 0.1–1.0)
Monocytes Relative: 5 %
Neutro Abs: 9.7 10*3/uL — ABNORMAL HIGH (ref 1.7–7.7)
Neutrophils Relative %: 89 %
Platelets: 126 10*3/uL — ABNORMAL LOW (ref 150–400)
RBC: 4.6 MIL/uL (ref 4.22–5.81)
RDW: 13.6 % (ref 11.5–15.5)
WBC: 10.9 10*3/uL — ABNORMAL HIGH (ref 4.0–10.5)
nRBC: 0 % (ref 0.0–0.2)

## 2023-03-30 LAB — COMPREHENSIVE METABOLIC PANEL
ALT: 25 U/L (ref 0–44)
AST: 26 U/L (ref 15–41)
Albumin: 3.7 g/dL (ref 3.5–5.0)
Alkaline Phosphatase: 48 U/L (ref 38–126)
Anion gap: 13 (ref 5–15)
BUN: 18 mg/dL (ref 8–23)
CO2: 20 mmol/L — ABNORMAL LOW (ref 22–32)
Calcium: 9.1 mg/dL (ref 8.9–10.3)
Chloride: 100 mmol/L (ref 98–111)
Creatinine, Ser: 1.12 mg/dL (ref 0.61–1.24)
GFR, Estimated: >60 mL/min (ref >60)
Glucose, Bld: 203 mg/dL — ABNORMAL HIGH (ref 70–99)
Potassium: 3.8 mmol/L (ref 3.5–5.1)
Sodium: 133 mmol/L — ABNORMAL LOW (ref 135–145)
Total Bilirubin: 1.2 mg/dL (ref 0.0–1.2)
Total Protein: 7.2 g/dL (ref 6.5–8.1)

## 2023-03-30 LAB — LACTIC ACID, PLASMA: Lactic Acid, Venous: 1.7 mmol/L (ref 0.5–1.9)

## 2023-03-30 LAB — PROTIME-INR
INR: 1.1 (ref 0.8–1.2)
Prothrombin Time: 14.7 s (ref 11.4–15.2)

## 2023-03-30 LAB — URINALYSIS, W/ REFLEX TO CULTURE (INFECTION SUSPECTED)
Bilirubin Urine: NEGATIVE
Glucose, UA: >=500 mg/dL — AB
Ketones, ur: 15 mg/dL — AB
Leukocytes,Ua: NEGATIVE
Nitrite: NEGATIVE
Protein, ur: 100 mg/dL — AB
Specific Gravity, Urine: 1.015 (ref 1.005–1.030)
pH: 5.5 (ref 5.0–8.0)

## 2023-03-30 LAB — RESP PANEL BY RT-PCR (RSV, FLU A&B, COVID)  RVPGX2
Influenza A by PCR: NEGATIVE
Influenza B by PCR: NEGATIVE
Resp Syncytial Virus by PCR: NEGATIVE
SARS Coronavirus 2 by RT PCR: NEGATIVE

## 2023-03-30 LAB — SEDIMENTATION RATE: Sed Rate: 14 mm/h (ref 0–16)

## 2023-03-30 LAB — C-REACTIVE PROTEIN: CRP: 13.4 mg/dL — ABNORMAL HIGH (ref <1.0)

## 2023-03-30 MED ORDER — SODIUM CHLORIDE 0.9 % IV SOLN
2.0000 g | Freq: Once | INTRAVENOUS | Status: AC
  Administered 2023-03-30: 2 g via INTRAVENOUS
  Filled 2023-03-30: qty 20

## 2023-03-30 MED ORDER — ACETAMINOPHEN 500 MG PO TABS
1000.0000 mg | ORAL_TABLET | Freq: Once | ORAL | Status: AC
  Administered 2023-03-30: 1000 mg via ORAL
  Filled 2023-03-30: qty 2

## 2023-03-30 MED ORDER — SODIUM CHLORIDE 0.9 % IV BOLUS
1000.0000 mL | Freq: Once | INTRAVENOUS | Status: AC
Start: 2023-03-30 — End: 2023-03-30
  Administered 2023-03-30: 1000 mL via INTRAVENOUS

## 2023-03-30 NOTE — ED Triage Notes (Addendum)
Pt presents with complaints of weakness and fatigue. Pt reports he had a fever last night at 103. Was given 1g tylenol last night and again at 0830 this morning. Per family member pt was slow to respond this morning and even had difficulty putting on his seat belt.  Stroke screen neg

## 2023-03-30 NOTE — ED Notes (Signed)
Spoke to [employer] for transport

## 2023-03-30 NOTE — ED Provider Notes (Signed)
[facility] Provider Note   CSN: [id number] Arrival date & time: 03/30/23  0848     History  Chief Complaint  Patient presents with   Weakness    Jon Jones is a 68 y.o. male.  With a history of type 2 diabetes, CAD, CHF with a EF of 35 to 40% on 06/01/2020, hypertension, peripheral vascular disease, anxiety, GERD presenting to the ED for evaluation of weakness.  He states he developed a fever of unknown source yesterday with Tmax of 103.  He otherwise felt well.  This morning he had some unsteadiness in his gait and some confusion so his family wanted him seen in the emergency department.  He denies any cough, congestion, rhinorrhea, chest pain, shortness of breath, abdominal pain, nausea, vomiting, diarrhea, dysuria, headache, numbness, weakness, tingling, vision changes.  He reports urinary frequency at baseline due to bladder issues.  No recent changes.  No recent travel.  He was given Tylenol approximately 1 hour prior to arrival.  History is provided by the patient and son at bedside   Weakness Associated symptoms: fever        Home Medications Prior to Admission medications   Medication Sig Start Date End Date Taking? Authorizing Provider  ACCU-CHEK AVIVA PLUS test strip TEST 3 TIMES DAILY 04/18/21   Shamleffer, Konrad Dolores, MD  Accu-Chek Softclix Lancets lancets CHECK BLOOD SUGAR ONCE DAILY 08/12/20   Marcine Matar, MD  acetaminophen (TYLENOL) 325 MG tablet Take 2 tablets (650 mg total) by mouth every 6 (six) hours as needed for mild pain (or Fever >/= 101). 10/01/20   Jackelyn Poling, DO  aspirin 81 MG EC tablet Take 81 mg by mouth daily. 09/12/18   [provider]  atorvastatin (LIPITOR) 80 MG tablet Take 1 tablet (80 mg total) by mouth every evening. 04/19/21   [name], MD  ciclopirox (PENLAC) 8 % solution Apply topically at bedtime. Apply over nail and surrounding skin. Apply daily over previous coat.  After seven (7) days, may remove with alcohol and continue cycle. 08/24/20   Marcine Matar, MD  clobetasol cream (TEMOVATE) 0.05 % Apply 1 application topically 2 (two) times daily. 03/26/21   Marcine Matar, MD  clopidogrel (PLAVIX) 75 MG tablet TAKE 1 TABLET BY MOUTH EVERY DAY 02/16/21   Lennette Bihari, MD  empagliflozin (JARDIANCE) 25 MG TABS tablet TAKE 1 TABLET(25 MG) BY MOUTH AT BEDTIME 04/08/21   Shamleffer, Konrad Dolores, MD  gabapentin (NEURONTIN) 300 MG capsule TAKE 1 CAPSULE(300 MG) BY MOUTH AT BEDTIME 04/07/21   Marcine Matar, MD  glipiZIDE (GLUCOTROL) 10 MG tablet TAKE 2 TABLETS(20 MG) BY MOUTH TWICE DAILY 04/08/21   Shamleffer, Konrad Dolores, MD  JANUMET XR [phone] MG TB24 Take 1 tablet by mouth daily. 07/12/21   [provider]  metoprolol tartrate (LOPRESSOR) 100 MG tablet Take 100 mg by mouth 2 (two) times daily.    [provider]  Misc. Devices MISC Cpap supplies-tubing mask Dx G47.33 and Z99.89 07/20/20   Marcine Matar, MD  [facility] 10 MG/0.5ML Pen Inject 10 mg into the skin once a week. 08/14/22   [provider]  nitroGLYCERIN (NITROSTAT) 0.4 MG SL tablet PLACE 1 TABLET UNDER THE TONGUE EVERY 5 MINS AS NEEDED FOR CHEST PAIN. MAXIMUM OF 3 DOSES 09/02/21   [name], MD  nystatin cream (MYCOSTATIN) SMARTSIG:Sparingly Topical 2-3 Times Daily PRN 01/12/21   [provider]  RESTASIS 0.05 %  ophthalmic emulsion Place 1 drop into both eyes in the morning and at bedtime. 09/25/20   [provider]  sacubitril-valsartan (ENTRESTO) 24-26 MG Take 1 tablet by mouth 2 (two) times daily. 09/10/20   [name], MD  Semaglutide,0.25 or 0.5MG /DOS, (OZEMPIC, 0.25 OR 0.5 MG/DOSE,) 2 MG/1.5ML SOPN Inject 0.25 mg into the skin once a week.    [provider]  silver sulfADIAZINE (SILVADENE) 1 % cream Apply 1 Application topically daily. 11/08/22   Helane Gunther, DPM  spironolactone (ALDACTONE) 25 MG tablet  Take 0.5 tablets (12.5 mg total) by mouth every other day. 10/25/21   Lennette Bihari, MD  terbinafine (LAMISIL) 250 MG tablet Take 1 tablet by mouth daily.    [provider]  VENTOLIN HFA 108 (90 Base) MCG/ACT inhaler INHALE 2 PUFFS BY MOUTH EVERY 6 HOURS AS NEEDED FOR WHEEZING OR SHORTNESS OF BREATH 05/26/21   Marcine Matar, MD      Allergies    Patient has no known allergies.    Review of Systems   Review of Systems  Constitutional:  Positive for fever.  Neurological:  Positive for weakness.  All other systems reviewed and are negative.   Physical Exam Updated Vital Signs BP 132/74   Pulse (!) 121   Temp 100.2 F (37.9 C) (Oral)   Resp (!) 21   Ht 5\' 6"  (1.676 m)   Wt 104.3 kg   SpO2 97%   BMI 37.12 kg/m  Physical Exam Vitals and nursing note reviewed.  Constitutional:      General: He is not in acute distress.    Appearance: He is well-developed.     Comments: Resting comfortably in bed  HENT:     Head: Normocephalic and atraumatic.  Eyes:     Conjunctiva/sclera: Conjunctivae normal.  Cardiovascular:     Rate and Rhythm: Normal rate and regular rhythm.     Heart sounds: No murmur heard. Pulmonary:     Effort: Pulmonary effort is normal. No respiratory distress.     Breath sounds: Normal breath sounds.  Abdominal:     Palpations: Abdomen is soft.     Tenderness: There is no abdominal tenderness.  Musculoskeletal:        General: No swelling.     Cervical back: Neck supple.  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     Capillary Refill: Capillary refill takes less than 2 seconds.     Comments: Erythema to right lower leg.  No other areas of erythema appreciated.  No decubitus ulcer.  Neurological:     General: No focal deficit present.     Mental Status: He is alert and oriented to person, place, and time.     Comments:   MENTAL STATUS: AAOx3    LANG/SPEECH: Fluent, intact naming, repetition & comprehension   CRANIAL NERVES:   II: Pupils equal and reactive   III, IV, VI: EOM intact, no gaze preference or deviation, no nystagmus   V: normal sensation of the face   VII: no facial asymmetry   VIII: normal hearing to speech   MOTOR: 5/5 in both upper and lower extremities   SENSORY: Normal to touch in all extremiteis   COORD: Normal finger to nose, heel to shin and shoulder shrug, no tremor, no dysmetria. No pronator drift   Psychiatric:  Mood and Affect: Mood normal.     ED Results / Procedures / Treatments   Labs (all labs ordered are listed, but only abnormal results are displayed) Labs Reviewed  COMPREHENSIVE METABOLIC PANEL - Abnormal; Notable for the following components:      Result Value   Sodium 133 (*)    CO2 20 (*)    Glucose, Bld 203 (*)    All other components within normal limits  CBC WITH DIFFERENTIAL/PLATELET - Abnormal; Notable for the following components:   WBC 10.9 (*)    Platelets 126 (*)    Neutro Abs 9.7 (*)    Lymphs Abs 0.5 (*)    All other components within normal limits  URINALYSIS, W/ REFLEX TO CULTURE (INFECTION SUSPECTED) - Abnormal; Notable for the following components:   Glucose, UA >=500 (*)    Hgb urine dipstick SMALL (*)    Ketones, ur 15 (*)    Protein, ur 100 (*)    Bacteria, UA RARE (*)    All other components within normal limits  RESP PANEL BY RT-PCR (RSV, FLU A&B, COVID)  RVPGX2  CULTURE, BLOOD (ROUTINE X 2)  CULTURE, BLOOD (ROUTINE X 2)  LACTIC ACID, PLASMA  PROTIME-INR  SEDIMENTATION RATE  C-REACTIVE PROTEIN    EKG None  Radiology DG Foot Complete Right Result Date: 03/30/2023 CLINICAL DATA:  Open wound, fifth toe EXAM: RIGHT FOOT COMPLETE - 3+ VIEW COMPARISON:  09/05/2022 FINDINGS: Stable amputation across proximal phalanx second toe. No fracture, dislocation, or other acute bone abnormality. No areas of focal cortical destruction. Calcaneal spurs. No  subcutaneous gas or radiodense foreign body. Scattered arterial calcifications. IMPRESSION: 1. No acute findings. 2. Second toe amputation. Electronically Signed   By: Corlis Leak M.D.   On: 03/30/2023 15:48   DG Chest Portable 1 View Result Date: 03/30/2023 CLINICAL DATA:  Weakness, fever. EXAM: PORTABLE CHEST 1 VIEW COMPARISON:  September 30, 2020. FINDINGS: Stable cardiomediastinal silhouette. Left-sided defibrillator is again noted. Lungs are clear. Bony thorax is unremarkable. IMPRESSION: No active disease. Electronically Signed   By: Lupita Raider M.D.   On: 03/30/2023 10:58    Procedures .Critical Care  Performed by: Michelle Piper, PA-C Authorized by: Michelle Piper, PA-C   Critical care provider statement:    Critical care time (minutes):  48   Critical care was necessary to treat or prevent imminent or life-threatening deterioration of the following conditions:  Sepsis   Critical care was time spent personally by me on the following activities:  Development of treatment plan with patient or surrogate, discussions with consultants, evaluation of patient's response to treatment, examination of patient, ordering and review of laboratory studies, ordering and review of radiographic studies, ordering and performing treatments and interventions, pulse oximetry, re-evaluation of patient's condition and review of old charts   Care discussed with: admitting provider   Comments:     Sepsis due to cellulitis of RLE     Medications Ordered in ED Medications  sodium chloride 0.9 % bolus 1,000 mL (0 mLs Intravenous Stopped 03/30/23 1533)  acetaminophen (TYLENOL) tablet 1,000 mg (1,000 mg Oral Given 03/30/23 1559)  cefTRIAXone (ROCEPHIN) 2 g in sodium chloride 0.9 % 100 mL IVPB (0 g Intravenous Stopped 03/30/23 1646)    ED Course/ Medical Decision Making/ A&P Clinical Course as of 03/30/23 1649  Fri Mar 30, 2023  1620 Spoke with hospitalist Dr. Austin Miles who will admit [AS]    Clinical  Course User Index [AS] Michelle Piper, PA-C  Medical Decision Making Amount and/or Complexity of Data Reviewed Labs: ordered. Radiology: ordered.  Risk OTC drugs. Decision regarding hospitalization.  This patient presents to the ED for concern of weakness, fever, this involves an extensive number of treatment options, and is a complaint that carries with it a high risk of complications and morbidity. The differential diagnosis of weakness includes but is not limited to neurologic causes (GBS, myasthenia gravis, CVA, MS, ALS, transverse myelitis, spinal cord injury, CVA, botulism, ) and other causes: ACS, Arrhythmia, syncope, orthostatic hypotension, sepsis, hypoglycemia, electrolyte disturbance, hypothyroidism, respiratory failure, symptomatic anemia, dehydration, heat injury, polypharmacy, malignancy.   My initial workup includes sepsis labs, CT head, EKG  Additional history obtained from: Nursing notes from this visit. Family son at bedside provides portion of history  I ordered, reviewed and interpreted labs which include: CBC, CMP, INR, lactate, cultures, urinalysis, respiratory panel, ESR, CRP.  Mild leukocytosis of 10.9.  No anemia.  Normal initial lactate.  Mild hyponatremia of 133, bicarb decreased to 28, nonfasting glucose of 203.  Normal kidney function and LFT.  Normal anion gap.  Urinalysis with 15 ketones, 100 protein, no evidence of infection.  Respiratory panel negative.  I ordered imaging studies including chest x-ray I independently visualized and interpreted imaging which showed negative I agree with the radiologist interpretation  Cardiac Monitoring:  The patient was maintained on a cardiac monitor.  I personally viewed and interpreted the cardiac monitored which showed an underlying rhythm of: Sinus tachycardia  Consultations Obtained:  I requested consultation with the hospitalist,  and discussed lab and imaging findings as  well as pertinent plan - they recommend: Admission  Afebrile, persistently tachycardic and tachypneic but otherwise hemodynamically stable.  68 year old male presenting to the ED for evaluation of altered mental status, fever.  Symptoms began yesterday.  Family at bedside states that he has not been making sense lately and had an unsteady gait today.  No focal neurologic deficits.  Lab workup as stated above.  On physical exam, he does have an area of cellulitis to the right lower leg.  Also has an open wound to the fifth toe.  Patient meets sepsis criteria with tachycardia, tachypnea, known source of infection.  Given his altered mental status, believe patient would benefit from admission and IV antibiotics.  Discussed this plan with the patient and family at bedside who is in agreement.  Patient's case discussed with Dr. Elayne Snare who agrees with plan to admit.   Note: Portions of this report may have been transcribed using voice recognition software. Every effort was made to ensure accuracy; however, inadvertent computerized transcription errors may still be present.         Final Clinical Impression(s) / ED Diagnoses Final diagnoses:  Sepsis without acute organ dysfunction, due to unspecified organism [facility])  Cellulitis of right lower extremity    Rx / DC Orders ED Discharge Orders     None         Michelle Piper, PA-C 03/30/23 1649    Royanne Foots, DO 04/03/23 1156

## 2023-03-31 ENCOUNTER — Encounter (HOSPITAL_COMMUNITY): Payer: Self-pay | Admitting: Student

## 2023-03-31 DIAGNOSIS — Z833 Family history of diabetes mellitus: Secondary | ICD-10-CM | POA: Diagnosis not present

## 2023-03-31 DIAGNOSIS — R531 Weakness: Secondary | ICD-10-CM | POA: Diagnosis present

## 2023-03-31 DIAGNOSIS — E876 Hypokalemia: Secondary | ICD-10-CM | POA: Diagnosis present

## 2023-03-31 DIAGNOSIS — Z8679 Personal history of other diseases of the circulatory system: Secondary | ICD-10-CM

## 2023-03-31 DIAGNOSIS — E871 Hypo-osmolality and hyponatremia: Secondary | ICD-10-CM | POA: Diagnosis present

## 2023-03-31 DIAGNOSIS — D6959 Other secondary thrombocytopenia: Secondary | ICD-10-CM | POA: Diagnosis present

## 2023-03-31 DIAGNOSIS — L0390 Cellulitis, unspecified: Secondary | ICD-10-CM | POA: Diagnosis not present

## 2023-03-31 DIAGNOSIS — Z1152 Encounter for screening for COVID-19: Secondary | ICD-10-CM | POA: Diagnosis not present

## 2023-03-31 DIAGNOSIS — E1140 Type 2 diabetes mellitus with diabetic neuropathy, unspecified: Secondary | ICD-10-CM | POA: Diagnosis present

## 2023-03-31 DIAGNOSIS — E1151 Type 2 diabetes mellitus with diabetic peripheral angiopathy without gangrene: Secondary | ICD-10-CM | POA: Diagnosis present

## 2023-03-31 DIAGNOSIS — G4733 Obstructive sleep apnea (adult) (pediatric): Secondary | ICD-10-CM

## 2023-03-31 DIAGNOSIS — I739 Peripheral vascular disease, unspecified: Secondary | ICD-10-CM | POA: Diagnosis not present

## 2023-03-31 DIAGNOSIS — I5022 Chronic systolic (congestive) heart failure: Secondary | ICD-10-CM

## 2023-03-31 DIAGNOSIS — A419 Sepsis, unspecified organism: Secondary | ICD-10-CM

## 2023-03-31 DIAGNOSIS — E119 Type 2 diabetes mellitus without complications: Secondary | ICD-10-CM

## 2023-03-31 DIAGNOSIS — E785 Hyperlipidemia, unspecified: Secondary | ICD-10-CM | POA: Diagnosis present

## 2023-03-31 DIAGNOSIS — D696 Thrombocytopenia, unspecified: Secondary | ICD-10-CM | POA: Diagnosis not present

## 2023-03-31 DIAGNOSIS — Z7984 Long term (current) use of oral hypoglycemic drugs: Secondary | ICD-10-CM | POA: Diagnosis not present

## 2023-03-31 DIAGNOSIS — I2510 Atherosclerotic heart disease of native coronary artery without angina pectoris: Secondary | ICD-10-CM | POA: Diagnosis present

## 2023-03-31 DIAGNOSIS — G629 Polyneuropathy, unspecified: Secondary | ICD-10-CM

## 2023-03-31 DIAGNOSIS — Z23 Encounter for immunization: Secondary | ICD-10-CM | POA: Diagnosis not present

## 2023-03-31 DIAGNOSIS — E1165 Type 2 diabetes mellitus with hyperglycemia: Secondary | ICD-10-CM | POA: Diagnosis present

## 2023-03-31 DIAGNOSIS — E872 Acidosis, unspecified: Secondary | ICD-10-CM | POA: Diagnosis present

## 2023-03-31 DIAGNOSIS — E11319 Type 2 diabetes mellitus with unspecified diabetic retinopathy without macular edema: Secondary | ICD-10-CM | POA: Diagnosis present

## 2023-03-31 DIAGNOSIS — R7982 Elevated C-reactive protein (CRP): Secondary | ICD-10-CM | POA: Diagnosis present

## 2023-03-31 DIAGNOSIS — I110 Hypertensive heart disease with heart failure: Secondary | ICD-10-CM | POA: Diagnosis present

## 2023-03-31 DIAGNOSIS — Z8249 Family history of ischemic heart disease and other diseases of the circulatory system: Secondary | ICD-10-CM | POA: Diagnosis not present

## 2023-03-31 DIAGNOSIS — I255 Ischemic cardiomyopathy: Secondary | ICD-10-CM | POA: Diagnosis present

## 2023-03-31 DIAGNOSIS — Z7985 Long-term (current) use of injectable non-insulin antidiabetic drugs: Secondary | ICD-10-CM | POA: Diagnosis not present

## 2023-03-31 DIAGNOSIS — Z79899 Other long term (current) drug therapy: Secondary | ICD-10-CM | POA: Diagnosis not present

## 2023-03-31 DIAGNOSIS — E669 Obesity, unspecified: Secondary | ICD-10-CM | POA: Diagnosis present

## 2023-03-31 DIAGNOSIS — G609 Hereditary and idiopathic neuropathy, unspecified: Secondary | ICD-10-CM

## 2023-03-31 DIAGNOSIS — L03115 Cellulitis of right lower limb: Secondary | ICD-10-CM | POA: Diagnosis present

## 2023-03-31 LAB — CBC
HCT: 40.2 % (ref 39.0–52.0)
Hemoglobin: 13 g/dL (ref 13.0–17.0)
MCH: 31.1 pg (ref 26.0–34.0)
MCHC: 32.3 g/dL (ref 30.0–36.0)
MCV: 96.2 fL (ref 80.0–100.0)
Platelets: 129 10*3/uL — ABNORMAL LOW (ref 150–400)
RBC: 4.18 MIL/uL — ABNORMAL LOW (ref 4.22–5.81)
RDW: 14 % (ref 11.5–15.5)
WBC: 9.5 10*3/uL (ref 4.0–10.5)
nRBC: 0 % (ref 0.0–0.2)

## 2023-03-31 LAB — COMPREHENSIVE METABOLIC PANEL
ALT: 20 U/L (ref 0–44)
AST: 33 U/L (ref 15–41)
Albumin: 3.3 g/dL — ABNORMAL LOW (ref 3.5–5.0)
Alkaline Phosphatase: 40 U/L (ref 38–126)
Anion gap: 10 (ref 5–15)
BUN: 18 mg/dL (ref 8–23)
CO2: 20 mmol/L — ABNORMAL LOW (ref 22–32)
Calcium: 8.5 mg/dL — ABNORMAL LOW (ref 8.9–10.3)
Chloride: 101 mmol/L (ref 98–111)
Creatinine, Ser: 0.85 mg/dL (ref 0.61–1.24)
GFR, Estimated: >60 mL/min (ref >60)
Glucose, Bld: 140 mg/dL — ABNORMAL HIGH (ref 70–99)
Potassium: 3.4 mmol/L — ABNORMAL LOW (ref 3.5–5.1)
Sodium: 131 mmol/L — ABNORMAL LOW (ref 135–145)
Total Bilirubin: 1.2 mg/dL (ref 0.0–1.2)
Total Protein: 6.7 g/dL (ref 6.5–8.1)

## 2023-03-31 LAB — HEMOGLOBIN A1C
Hgb A1c MFr Bld: 8.5 % — ABNORMAL HIGH (ref 4.8–5.6)
Mean Plasma Glucose: 197.25 mg/dL

## 2023-03-31 LAB — GLUCOSE, CAPILLARY
Glucose-Capillary: 115 mg/dL — ABNORMAL HIGH (ref 70–99)
Glucose-Capillary: 154 mg/dL — ABNORMAL HIGH (ref 70–99)
Glucose-Capillary: 164 mg/dL — ABNORMAL HIGH (ref 70–99)
Glucose-Capillary: 169 mg/dL — ABNORMAL HIGH (ref 70–99)

## 2023-03-31 LAB — MAGNESIUM: Magnesium: 2.1 mg/dL (ref 1.7–2.4)

## 2023-03-31 LAB — HIV ANTIBODY (ROUTINE TESTING W REFLEX): HIV Screen 4th Generation wRfx: NONREACTIVE

## 2023-03-31 MED ORDER — ONDANSETRON HCL 4 MG PO TABS: 4.0000 mg | ORAL_TABLET | Freq: Four times a day (QID) | ORAL | Status: DC | PRN

## 2023-03-31 MED ORDER — ASPIRIN 81 MG PO TBEC
81.0000 mg | DELAYED_RELEASE_TABLET | Freq: Every day | ORAL | Status: DC
  Administered 2023-03-31 – 2023-04-02 (×3): 81 mg via ORAL
  Filled 2023-03-31 (×3): qty 1

## 2023-03-31 MED ORDER — SODIUM CHLORIDE 0.9 % IV SOLN: 2.0000 g | Freq: Once | INTRAVENOUS | Status: DC

## 2023-03-31 MED ORDER — VANCOMYCIN HCL 1000 MG IV SOLR
750.0000 mg | Freq: Two times a day (BID) | INTRAVENOUS | Status: DC
  Administered 2023-03-31 – 2023-04-01 (×2): 750 mg via INTRAVENOUS
  Filled 2023-03-31 (×3): qty 15

## 2023-03-31 MED ORDER — INSULIN ASPART 100 UNIT/ML IJ SOLN: 0.0000 [IU] | Freq: Every day | INTRAMUSCULAR | Status: DC

## 2023-03-31 MED ORDER — ATORVASTATIN CALCIUM 40 MG PO TABS
80.0000 mg | ORAL_TABLET | Freq: Every evening | ORAL | Status: DC
  Administered 2023-03-31 – 2023-04-01 (×2): 80 mg via ORAL
  Filled 2023-03-31 (×2): qty 2

## 2023-03-31 MED ORDER — ONDANSETRON HCL 4 MG/2ML IJ SOLN
4.0000 mg | Freq: Four times a day (QID) | INTRAMUSCULAR | Status: DC | PRN
  Administered 2023-04-01: 4 mg via INTRAVENOUS
  Filled 2023-03-31: qty 2

## 2023-03-31 MED ORDER — VANCOMYCIN HCL IN DEXTROSE 1-5 GM/200ML-% IV SOLN: 1000.0000 mg | Freq: Once | INTRAVENOUS | Status: DC

## 2023-03-31 MED ORDER — VANCOMYCIN HCL 2000 MG/400ML IV SOLN
2000.0000 mg | Freq: Once | INTRAVENOUS | Status: AC
  Administered 2023-03-31: 2000 mg via INTRAVENOUS
  Filled 2023-03-31: qty 400

## 2023-03-31 MED ORDER — SODIUM CHLORIDE 0.9 % IV SOLN
2.0000 g | Freq: Three times a day (TID) | INTRAVENOUS | Status: DC
  Administered 2023-03-31 – 2023-04-01 (×4): 2 g via INTRAVENOUS
  Filled 2023-03-31 (×4): qty 12.5

## 2023-03-31 MED ORDER — VANCOMYCIN HCL 750 MG/150ML IV SOLN
750.0000 mg | Freq: Two times a day (BID) | INTRAVENOUS | Status: DC
  Filled 2023-03-31: qty 150

## 2023-03-31 MED ORDER — SENNOSIDES-DOCUSATE SODIUM 8.6-50 MG PO TABS: 1.0000 | ORAL_TABLET | Freq: Every evening | ORAL | Status: DC | PRN

## 2023-03-31 MED ORDER — SODIUM CHLORIDE 0.9% FLUSH
3.0000 mL | INTRAVENOUS | Status: DC | PRN
Start: 2023-03-31 — End: 2023-04-01

## 2023-03-31 MED ORDER — PNEUMOCOCCAL 20-VAL CONJ VACC 0.5 ML IM SUSY
0.5000 mL | PREFILLED_SYRINGE | INTRAMUSCULAR | Status: AC
  Administered 2023-04-01: 0.5 mL via INTRAMUSCULAR
  Filled 2023-03-31: qty 0.5

## 2023-03-31 MED ORDER — SODIUM CHLORIDE 0.9 % IV SOLN: 250.0000 mL | INTRAVENOUS | Status: DC | PRN

## 2023-03-31 MED ORDER — VANCOMYCIN HCL 10 G IV SOLR: 750.0000 mg | Freq: Two times a day (BID) | INTRAVENOUS | Status: DC

## 2023-03-31 MED ORDER — CLOPIDOGREL BISULFATE 75 MG PO TABS
75.0000 mg | ORAL_TABLET | Freq: Every day | ORAL | Status: DC
  Administered 2023-03-31 – 2023-04-02 (×3): 75 mg via ORAL
  Filled 2023-03-31 (×3): qty 1

## 2023-03-31 MED ORDER — ENOXAPARIN SODIUM 60 MG/0.6ML IJ SOSY
50.0000 mg | PREFILLED_SYRINGE | INTRAMUSCULAR | Status: DC
  Administered 2023-03-31 – 2023-04-02 (×3): 50 mg via SUBCUTANEOUS
  Filled 2023-03-31 (×3): qty 0.6

## 2023-03-31 MED ORDER — VANCOMYCIN HCL 750 MG/150ML IV SOLN: 750.0000 mg | INTRAVENOUS | Status: DC

## 2023-03-31 MED ORDER — ACETAMINOPHEN 650 MG RE SUPP: 650.0000 mg | Freq: Four times a day (QID) | RECTAL | Status: DC | PRN

## 2023-03-31 MED ORDER — ACETAMINOPHEN 325 MG PO TABS
650.0000 mg | ORAL_TABLET | Freq: Four times a day (QID) | ORAL | Status: DC | PRN
  Administered 2023-03-31: 650 mg via ORAL
  Filled 2023-03-31: qty 2

## 2023-03-31 MED ORDER — INFLUENZA VAC A&B SURF ANT ADJ 0.5 ML IM SUSY
0.5000 mL | PREFILLED_SYRINGE | INTRAMUSCULAR | Status: AC
  Administered 2023-04-01: 0.5 mL via INTRAMUSCULAR
  Filled 2023-03-31: qty 0.5

## 2023-03-31 MED ORDER — INSULIN ASPART 100 UNIT/ML IJ SOLN
0.0000 [IU] | Freq: Three times a day (TID) | INTRAMUSCULAR | Status: DC
  Administered 2023-03-31 – 2023-04-02 (×6): 1 [IU] via SUBCUTANEOUS

## 2023-03-31 MED ORDER — GABAPENTIN 300 MG PO CAPS
300.0000 mg | ORAL_CAPSULE | Freq: Every day | ORAL | Status: DC
  Administered 2023-03-31 – 2023-04-01 (×2): 300 mg via ORAL
  Filled 2023-03-31 (×2): qty 1

## 2023-03-31 MED ORDER — METOPROLOL TARTRATE 50 MG PO TABS
100.0000 mg | ORAL_TABLET | Freq: Two times a day (BID) | ORAL | Status: DC
  Administered 2023-03-31 – 2023-04-02 (×6): 100 mg via ORAL
  Filled 2023-03-31 (×6): qty 2

## 2023-03-31 MED ORDER — POTASSIUM CHLORIDE CRYS ER 20 MEQ PO TBCR
40.0000 meq | EXTENDED_RELEASE_TABLET | Freq: Once | ORAL | Status: AC
  Administered 2023-03-31: 40 meq via ORAL
  Filled 2023-03-31: qty 2

## 2023-03-31 MED ORDER — ACETAMINOPHEN 325 MG PO TABS: 650.0000 mg | ORAL_TABLET | Freq: Four times a day (QID) | ORAL | Status: DC | PRN

## 2023-03-31 MED ORDER — SODIUM CHLORIDE 0.9% FLUSH
3.0000 mL | Freq: Two times a day (BID) | INTRAVENOUS | Status: DC
  Administered 2023-03-31: 3 mL via INTRAVENOUS

## 2023-03-31 NOTE — H&P (Addendum)
History and Physical    Jon Jones MWU:[id number] DOB: 26-Sep-1955 DOA: 03/30/2023  PCP: Pcp, No   Patient coming from: Home   Chief Complaint:  Chief Complaint  Patient presents with   Weakness   ED TRIAGE note:pt  presents with complaints of weakness and fatigue. Pt reports he had a fever last night at 103. Was given 1g tylenol last night and again at 0830 this morning. Per family member pt was slow to respond this morning and even had difficulty putting on his seat belt.   HPI:  Jon Jones is a 68 y.o. male with medical history significant of CAD, ischemic cardiomyopathy with reduced EF 35% status post ICD placement, chronic tobacco use, peripheral vascular disease, non-insulin-dependent DM type II, hyperlipidemia, peripheral neuropathy, OSA and gout presented to emergency department complaining of generalized weakness and fatigue and fever.  At initial presentation to ED patient is hypotensive, tachycardic and febrile Tmax 102 F. Blood cultures are in process. CMP showed low sodium 133, low bicarb 20 elevated blood glucose 203 otherwise unremarkable. Normal lactic acid 1.7. CBC showing leukocytosis 10.9 and low platelet 126. Normal pro time INR. Respiratory panel unremarkable. UA no leukocyte esterase and nitrate.  Bacteria present. Normal ESR and C-reactive protein.  Chest x-ray no active disease process.  X-ray of the no acute finding and second toe amputation.  In the ED patient has been treated with ceftriaxone 2 g and 1 L of NS bolus. Patient has been transferred to [facility] and hospitalist has been consulted for further management of sepsis secondary to right lower extremity cellulitis.  During my ablation at the bedside patient reported that he has been experiencing right-sided lower leg pain and developed some redness for last 1 to 2 days.  He also had fever at home.  Reporting malaise.  Denies any chest pain, cough, shortness of breath, palpitation,  headache, nausea, vomiting, diarrhea and constipation.  Denies any injury and recent bite of the leg.   Significant labs in the ED: Lab Orders         Culture, blood (Routine x 2)         Resp panel by RT-PCR (RSV, Flu A&B, Covid) Anterior Nasal Swab         Comprehensive metabolic panel         CBC with Differential         Protime-INR         Urinalysis, w/ Reflex to Culture (Infection Suspected) -Urine, Clean Catch         Sedimentation rate         C-reactive protein         HIV Antibody (routine testing w rflx)         Comprehensive metabolic panel         CBC         Hemoglobin A1c       Review of Systems:  Review of Systems  Constitutional:  Positive for fever. Negative for malaise/fatigue and weight loss.  Respiratory:  Negative for cough and shortness of breath.   Cardiovascular:  Negative for chest pain and palpitations.  Gastrointestinal:  Negative for heartburn and nausea.  Musculoskeletal:  Positive for myalgias. Negative for back pain, falls, joint pain and neck pain.  Skin:  Negative for itching and rash.  Neurological:  Negative for dizziness and headaches.  Psychiatric/Behavioral:  The patient is not nervous/anxious.   All other systems reviewed and are negative.   Past Medical History:  Diagnosis Date   5 years ago    AICD (automatic cardioverter/defibrillator) present    Anxiety    Arthritis    Cataract    CHF (congestive heart failure) ([name])    Coronary artery disease    Diabetes mellitus without complication ([name])    Diabetic retinopathy ([name])    Dysrhythmia    GERD (gastroesophageal reflux disease)    PMH   Hypertension    Hypertensive retinopathy    Peripheral vascular disease ([name])    Peripheral vascular disease ([name])    Pneumonia    Shoulder impingement, right    Sleep apnea    wears cpap   Stomach ulcer    Wears glasses     Past Surgical History:  Procedure Laterality Date   AMPUTATION Left 03/12/2019   Procedure: AMPUTATION LEFT  5TH TOE;  Surgeon: [name], MD;  Location: [facility] OR;  Service: Orthopedics;  Laterality: Left;   AMPUTATION Right 10/01/2020   Procedure: RIGHT 2nd TOE AMPUTATION;  Surgeon: [name], DPM;  Location: [facility] OR;  Service: Podiatry;  Laterality: Right;   CARDIAC CATHETERIZATION     CARDIAC DEFIBRILLATOR PLACEMENT     CIRCUMCISION N/A 11/05/2020   Procedure: CIRCUMCISION ADULT;  Surgeon: [name], MD;  Location: [facility] ORS;  Service: Urology;  Laterality: N/A;   I & D EXTREMITY Left 07/19/2016   Procedure: IRRIGATION AND DEBRIDEMENT EXTREMITY/OLECRANON(WASHOUT);  Surgeon: Tarry Kos, MD;  Location: [facility] OR;  Service: Orthopedics;  Laterality: Left;   OLECRANON BURSECTOMY Left 07/19/2016   Procedure: LEFT ELBOW OLECRANON BURSECTOMY;  Surgeon: Tarry Kos, MD;  Location: [facility];  Service: Orthopedics;  Laterality: Left;   SHOULDER ARTHROSCOPY Right 05/16/2019   Procedure: Right Shoulder Arthroscopy;  Surgeon: [name], MD;  Location: [facility] OR;  Service: Orthopedics;  Laterality: Right;     reports that he quit smoking about 4 years ago. His smoking use included cigarettes. He started smoking about 44 years ago. He has a 40 pack-year smoking history. He has never used smokeless tobacco. He reports that he does not currently use alcohol. He reports that he does not currently use drugs after having used the following drugs: Oxycodone.  No Known Allergies  Family History  Problem Relation Age of Onset   Heart disease Mother    Diabetes Mother    Peripheral vascular disease Mother     Prior to Admission medications   Medication Sig Start Date End Date Taking? Authorizing Provider  ACCU-CHEK AVIVA PLUS test strip TEST 3 TIMES DAILY 04/18/21   Shamleffer, Konrad Dolores, MD  Accu-Chek Softclix Lancets lancets CHECK BLOOD SUGAR ONCE DAILY 08/12/20   Marcine Matar, MD  acetaminophen (TYLENOL) 325 MG tablet Take 2 tablets (650 mg total) by mouth every 6 (six) hours as needed  for mild pain (or Fever >/= 101). 10/01/20   Jackelyn Poling, DO  aspirin 81 MG EC tablet Take 81 mg by mouth daily. 09/12/18   [provider]  atorvastatin (LIPITOR) 80 MG tablet Take 1 tablet (80 mg total) by mouth every evening. 04/19/21   [name], MD  ciclopirox (PENLAC) 8 % solution Apply topically at bedtime. Apply over nail and surrounding skin. Apply daily over previous coat. After seven (7) days, may remove with alcohol and continue cycle. 08/24/20   Marcine Matar, MD  clobetasol cream (TEMOVATE) 0.05 % Apply 1 application topically 2 (two) times daily. 03/26/21   Marcine Matar, MD  clopidogrel (PLAVIX)  75 MG tablet TAKE 1 TABLET BY MOUTH EVERY DAY 02/16/21   Lennette Bihari, MD  empagliflozin (JARDIANCE) 25 MG TABS tablet TAKE 1 TABLET(25 MG) BY MOUTH AT BEDTIME 04/08/21   Shamleffer, Konrad Dolores, MD  gabapentin (NEURONTIN) 300 MG capsule TAKE 1 CAPSULE(300 MG) BY MOUTH AT BEDTIME 04/07/21   Marcine Matar, MD  glipiZIDE (GLUCOTROL) 10 MG tablet TAKE 2 TABLETS(20 MG) BY MOUTH TWICE DAILY 04/08/21   Shamleffer, Konrad Dolores, MD  JANUMET XR [phone] MG TB24 Take 1 tablet by mouth daily. 07/12/21   [provider]  metoprolol tartrate (LOPRESSOR) 100 MG tablet Take 100 mg by mouth 2 (two) times daily.    [provider]  Misc. Devices MISC Cpap supplies-tubing mask Dx G47.33 and Z99.89 07/20/20   Marcine Matar, MD  [facility] 10 MG/0.5ML Pen Inject 10 mg into the skin once a week. 08/14/22   [provider]  nitroGLYCERIN (NITROSTAT) 0.4 MG SL tablet PLACE 1 TABLET UNDER THE TONGUE EVERY 5 MINS AS NEEDED FOR CHEST PAIN. MAXIMUM OF 3 DOSES 09/02/21   [name], MD  nystatin cream (MYCOSTATIN) SMARTSIG:Sparingly Topical 2-3 Times Daily PRN 01/12/21   [provider]  RESTASIS 0.05 % ophthalmic emulsion Place 1 drop into both eyes in the morning and at bedtime. 09/25/20   [provider]  sacubitril-valsartan  (ENTRESTO) 24-26 MG Take 1 tablet by mouth 2 (two) times daily. 09/10/20   [name], MD  Semaglutide,0.25 or 0.5MG /DOS, (OZEMPIC, 0.25 OR 0.5 MG/DOSE,) 2 MG/1.5ML SOPN Inject 0.25 mg into the skin once a week.    [provider]  silver sulfADIAZINE (SILVADENE) 1 % cream Apply 1 Application topically daily. 11/08/22   Helane Gunther, DPM  spironolactone (ALDACTONE) 25 MG tablet Take 0.5 tablets (12.5 mg total) by mouth every other day. 10/25/21   Lennette Bihari, MD  terbinafine (LAMISIL) 250 MG tablet Take 1 tablet by mouth daily.    [provider]  VENTOLIN HFA 108 (90 Base) MCG/ACT inhaler INHALE 2 PUFFS BY MOUTH EVERY 6 HOURS AS NEEDED FOR WHEEZING OR SHORTNESS OF BREATH 05/26/21   Marcine Matar, MD     Physical Exam: Vitals:   03/30/23 2330 03/31/23 0000 03/31/23 0030 03/31/23 0117  BP: 127/65 109/69 117/82 (!) 148/76  Pulse: (!) 105 (!) 111 (!) 110 (!) 110  Resp: (!) 22 20 17 20   Temp:    (!) 102 F (38.9 C)  TempSrc:    Oral  SpO2: 98% 96% 98% 98%  Weight:      Height:        Physical Exam Constitutional:      General: He is not in acute distress.    Appearance: He is not ill-appearing.  HENT:     Mouth/Throat:     Mouth: Mucous membranes are moist.  Eyes:     Conjunctiva/sclera: Conjunctivae normal.  Cardiovascular:     Rate and Rhythm: Regular rhythm. Tachycardia present.     Pulses: Normal pulses.     Heart sounds: Normal heart sounds.  Pulmonary:     Effort: Pulmonary effort is normal.     Breath sounds: Normal breath sounds.  Abdominal:     General: Bowel sounds are normal.  Musculoskeletal:     Cervical back: Normal range of motion and neck supple.     Right lower leg: No edema.     Left lower leg: No edema.     Comments: Right-sided lower extremity medial aspect  redness and tenderness on palpation.  There is no visible skin elevation, discharge and abscess formation.  Skin:    Capillary Refill: Capillary refill takes less  than 2 seconds.  Neurological:     Mental Status: He is alert and oriented to person, place, and time.  Psychiatric:        Mood and Affect: Mood normal.      Media Information   Document Information  Photos    03/31/2023 02:18  Attached To:  Hospital Encounter on 03/30/23  Source Information  Tereasa Coop, MD  [id number]  Document History     Labs on Admission: I have personally reviewed following labs and imaging studies  CBC: Recent Labs  Lab 03/30/23 0910  WBC 10.9*  NEUTROABS 9.7*  HGB 14.6  HCT 42.5  MCV 92.4  PLT 126*   Basic Metabolic Panel: Recent Labs  Lab 03/30/23 0910  NA 133*  K 3.8  CL 100  CO2 20*  GLUCOSE 203*  BUN 18  CREATININE 1.12  CALCIUM 9.1   GFR: Estimated Creatinine Clearance: 74.4 mL/min (by C-G formula based on SCr of 1.12 mg/dL). Liver Function Tests: Recent Labs  Lab 03/30/23 0910  AST 26  ALT 25  ALKPHOS 48  BILITOT 1.2  PROT 7.2  ALBUMIN 3.7   No results for input(s): "LIPASE", "AMYLASE" in the last 168 hours. No results for input(s): "AMMONIA" in the last 168 hours. Coagulation Profile: Recent Labs  Lab 03/30/23 0910  INR 1.1   Cardiac Enzymes: No results for input(s): "CKTOTAL", "CKMB", "CKMBINDEX", "TROPONINI", "TROPONINIHS" in the last 168 hours. BNP (last 3 results) No results for input(s): "BNP" in the last 8760 hours. HbA1C: No results for input(s): "HGBA1C" in the last 72 hours. CBG: No results for input(s): "GLUCAP" in the last 168 hours. Lipid Profile: No results for input(s): "CHOL", "HDL", "LDLCALC", "TRIG", "CHOLHDL", "LDLDIRECT" in the last 72 hours. Thyroid Function Tests: No results for input(s): "TSH", "T4TOTAL", "FREET4", "T3FREE", "THYROIDAB" in the last 72 hours. Anemia Panel: No results for input(s): "VITAMINB12", "FOLATE", "FERRITIN", "TIBC", "IRON", "RETICCTPCT" in the last 72 hours. Urine analysis:    Component Value Date/Time   COLORURINE YELLOW 03/30/2023 1241    APPEARANCEUR CLEAR 03/30/2023 1241   LABSPEC 1.015 03/30/2023 1241   PHURINE 5.5 03/30/2023 1241   GLUCOSEU >=500 (A) 03/30/2023 1241   HGBUR SMALL (A) 03/30/2023 1241   BILIRUBINUR NEGATIVE 03/30/2023 1241   KETONESUR 15 (A) 03/30/2023 1241   PROTEINUR 100 (A) 03/30/2023 1241   NITRITE NEGATIVE 03/30/2023 1241   LEUKOCYTESUR NEGATIVE 03/30/2023 1241    Radiological Exams on Admission: I have personally reviewed images DG Foot Complete Right Result Date: 03/30/2023 CLINICAL DATA:  Open wound, fifth toe EXAM: RIGHT FOOT COMPLETE - 3+ VIEW COMPARISON:  09/05/2022 FINDINGS: Stable amputation across proximal phalanx second toe. No fracture, dislocation, or other acute bone abnormality. No areas of focal cortical destruction. Calcaneal spurs. No subcutaneous gas or radiodense foreign body. Scattered arterial calcifications. IMPRESSION: 1. No acute findings. 2. Second toe amputation. Electronically Signed   By: Corlis Leak M.D.   On: 03/30/2023 15:48   DG Chest Portable 1 View Result Date: 03/30/2023 CLINICAL DATA:  Weakness, fever. EXAM: PORTABLE CHEST 1 VIEW COMPARISON:  September 30, 2020. FINDINGS: Stable cardiomediastinal silhouette. Left-sided defibrillator is again noted. Lungs are clear. Bony thorax is unremarkable. IMPRESSION: No active disease. Electronically Signed   By: Lupita Raider M.D.   On: 03/30/2023 10:58  EKG: My personal interpretation of EKG shows: Sinus tachycardia heart rate 116.  Multiple premature ventricular complex.    Assessment/Plan: Principal Problem:   Sepsis due to cellulitis [facility]) Active Problems:   Thrombocytopenia ([name])   OSA (obstructive sleep apnea)   PVD (peripheral vascular disease) ([name])   Non-insulin dependent type 2 diabetes mellitus ([name])   Chronic systolic CHF (congestive heart failure) reduced EF 35% ([name])   History of CAD (coronary artery disease)   Hyperlipidemia   Peripheral neuropathy   Hyponatremia   Metabolic acidosis     Assessment and Plan: Sepsis secondary to right lower extremity cellulitis -Patient presented with complaining of right-sided lower extremity pain, erythema, fever at home and malaise. - In the ED patient found hypotensive blood pressure was 96/58, tachycardic 114, leukocytosis 10.9.  Normal lactic acid.  Normal ESR and CRP.  Blood culture pending. Physical exam showing right-sided lower extremity medial aspect erythema and tenderness on palpation. - With the concern for sepsis secondary to right-sided lower extremity send like to see me the patient has been treated with IV ceftriaxone 2 g. - Given patient is persistently tachycardic and having high temperature 102 F broadening antibiotic to IV vancomycin and cefepime with pharmacy consult and will follow-up with blood cultures for further antibiotic guidance. - Continue to monitor fever curve, WBC count.   Thrombocytopenia -CBC showing low platelet 126.  Normal platelet count and baseline Thrombocytopenia in the setting of sepsis.  Continue to monitor.  History of OSA -Patient reports using CPAP at bedtime.  Continue CPAP while in the hospital.  Systolic heart failure reduced EF 35% s/p ICD EKG showing sinus tachycardia heart rate 169 premature ventricular complex. - Given patient is hypotensive and in the setting of sepsis holding Entresto and spironolactone. - Given patient is persistently tachycardic continue metoprolol 100 mg twice daily.  Patient's blood pressure has been improved but still holding Entresto and spironolactone at this moment.  In the ED patient received 1 L of NS bolus.  Deferring further IV fluid at this time given patient has history of CHF with low EF 35%. -Continue cardiac monitoring.   History of CAD -Continue aspirin, Plavix, Lipitor and metoprolol.  History of peripheral vascular disease - Continue aspirin, Plavix and Lipitor.  Hyperlipidemia - Continue Lipitor.  Non-insulin-dependent DM type  II -Holding [place], [place] and Janumet in hospital setting.  Patient also takes Mounjaro 10 mg once weekly and also Ozempic? - Continue low sliding scale SSI and at bedtime insulin as needed coverage with mealtime.  Peripheral neuropathy - Continue gabapentin 300 mg at bedtime.   Mild hyponatremia - Serum sodium is 133.  Unclear etiology of development of hyponatremia and this moment.  Continue to monitor.  Non-anion gap metabolic acidosis - Slight low bicarb 20.  Continue to monitor.  DVT prophylaxis:  Lovenox Code Status:  Full Code Diet:  Family Communication:   Family was present at bedside, at the time of interview. Opportunity was given to ask question and all questions were answered satisfactorily.  Disposition Plan: Will follow-up with blood culture result for antibiotic guidance. Consults: None at this time. Admission status:   Inpatient, Telemetry bed  Severity of Illness: The appropriate patient status for this patient is INPATIENT. Inpatient status is judged to be reasonable and necessary in order to provide the required intensity of service to ensure the patient's safety. The patient's presenting symptoms, physical exam findings, and initial radiographic and laboratory data in the context of their chronic comorbidities is felt to  place them at high risk for further clinical deterioration. Furthermore, it is not anticipated that the patient will be medically stable for discharge from the hospital within 2 midnights of admission.   * I certify that at the point of admission it is my clinical judgment that the patient will require inpatient hospital care spanning beyond 2 midnights from the point of admission due to high intensity of service, high risk for further deterioration and high frequency of surveillance required.Marland Kitchen    Tereasa Coop, MD [facility]  How to contact the [facility] Attending or Consulting provider 7A - 7P or covering provider during after hours 7P  -7A, for this patient.  Check the care team in [facility] and look for a) attending/consulting [facility] provider listed and b) the [facility] team listed Log into [email or web address] and use [name]'s universal password to access. If you do not have the password, please contact the hospital operator. Locate the [facility] provider you are looking for under [facility] and page to a number that you can be directly reached. If you still have difficulty reaching the provider, please page the [facility] (Director on Call) for the Hospitalists listed on amion for assistance.  03/31/2023, 2:50 AM

## 2023-03-31 NOTE — Progress Notes (Signed)
[facility] PROGRESS NOTE   Jon Jones:[id number] DOB: 1955/11/30 DOA: 03/30/2023  PCP: Pcp, No  Brief History: 69 y.o. male with medical history significant of CAD, ischemic cardiomyopathy with reduced EF 35% status post ICD placement, chronic tobacco use, peripheral vascular disease, non-insulin-dependent DM type II, hyperlipidemia, peripheral neuropathy, OSA and gout presented to emergency department complaining of generalized weakness and fatigue and fever.  He was noted to have right lower extremity erythema which was thought to be the source of his infection.  He was hospitalized for further management of cellulitis.    Consultants: None  Procedures: None    Subjective/Interval History: Patient mentions that he feels better this morning.  He thinks that the redness of the right leg has improved.  He denies any nausea vomiting.  No abdominal pain.    Assessment/Plan:  Right lower extremity cellulitis/sepsis, present on admission. Patient was noted to be febrile tachycardic and had a source of infection.  He also had leukocytosis. Patient placed on broad-spectrum antibiotics with vancomycin and cefepime.  Redness appears to have improved this morning in the right lower extremity.  No indication to pursue imaging studies at this time. Had another episode of fever early this morning.  Blood pressures have stabilized. Lactic acid level was 1.7.  WBC is normal today.  CRP elevated at 13.4.  ESR 14. No other source of infection identified.  Influenza, RSV and COVID-19 PCR's were negative.  UA was unremarkable.  Chest x-ray did not show any infection. Follow-up on blood cultures.  Hyponatremia and hypokalemia Monitor electrolytes.  Supplement potassium.  Check magnesium level.  Thrombocytopenia Stable.  Chronic systolic CHF EF known to be 35%.  He is status post ICD. Seems to be stable from a volume standpoint.  Will be cautious using IV fluids going  forward. Holding Entresto and spironolactone for now.  Can be resumed tomorrow if blood work and blood pressures remained stable. It looks like his metoprolol is being continued.  Reasonable to do so at this time.  History of coronary artery disease He is stable from cardiac standpoint.  Noted to be on aspirin Plavix statin beta-blocker.  Diabetes mellitus type 2, with diabetic neuropathy HbA1c is 8.5.  Monitor CBGs.  SSI.  Jardiance currently on hold. Continue gabapentin  History of peripheral vascular disease/hyperlipidemia Stable.  Continue statin.  History of obstructive sleep apnea CPAP at nighttime.  Obesity Estimated body mass index is 37.12 kg/m as calculated from the following:   Height as of this encounter: 5\' 6"  (1.676 m).   Weight as of this encounter: 104.3 kg.   DVT Prophylaxis: Lovenox Code Status: Full code Family Communication: Discussed with patient Disposition Plan: Anticipate discharge home when improved  Status is: Observation The patient will require care spanning > 2 midnights and should be moved to inpatient because: Right lower extremity cellulitis with sepsis requiring IV antibiotics    Medications: Scheduled:  aspirin EC  81 mg Oral Daily   atorvastatin  80 mg Oral QPM   clopidogrel  75 mg Oral Daily   enoxaparin (LOVENOX) injection  50 mg Subcutaneous Q24H   gabapentin  300 mg Oral QHS   [START ON 04/01/2023] influenza vaccine adjuvanted  0.5 mL Intramuscular Tomorrow-1000   insulin aspart  0-5 Units Subcutaneous QHS   insulin aspart  0-6 Units Subcutaneous TID WC   metoprolol tartrate  100 mg Oral BID   [START ON 04/01/2023] pneumococcal 20-valent conjugate vaccine  0.5 mL Intramuscular Tomorrow-1000   sodium chloride  flush  3 mL Intravenous Q12H   Continuous:  sodium chloride     ceFEPime (MAXIPIME) IV 2 g (03/31/23 0257)   vancomycin (VANCOCIN) 750 mg in sodium chloride 0.9 % 250 mL IVPB     YNW:GNFAOZ chloride, acetaminophen **OR**  acetaminophen, ondansetron **OR** ondansetron (ZOFRAN) IV, senna-docusate, sodium chloride flush  Antibiotics: Anti-infectives (From admission, onward)    Start     Dose/Rate Route Frequency Ordered Stop   03/31/23 1500  vancomycin (VANCOREADY) IVPB 750 mg/150 mL  Status:  Discontinued        750 mg 150 mL/hr over 60 Minutes Intravenous Every 12 hours 03/31/23 0221 03/31/23 0933   03/31/23 1500  vancomycin (VANCOCIN) 750 mg in sodium chloride 0.9 % 500 mL IVPB  Status:  Discontinued        750 mg 253.8 mL/hr over 120 Minutes Intravenous Every 12 hours 03/31/23 0928 03/31/23 0929   03/31/23 1500  vancomycin (VANCOCIN) 750 mg in sodium chloride 0.9 % 250 mL IVPB        750 mg 265 mL/hr over 60 Minutes Intravenous Every 12 hours 03/31/23 0933     03/31/23 1030  vancomycin (VANCOREADY) IVPB 750 mg/150 mL  Status:  Discontinued        750 mg 150 mL/hr over 60 Minutes Intravenous Every 24 hours 03/31/23 0932 03/31/23 0932   03/31/23 0315  vancomycin (VANCOREADY) IVPB 2000 mg/400 mL        2,000 mg 200 mL/hr over 120 Minutes Intravenous  Once 03/31/23 0216 03/31/23 0623   03/31/23 0300  vancomycin (VANCOCIN) IVPB 1000 mg/200 mL premix  Status:  Discontinued        1,000 mg 200 mL/hr over 60 Minutes Intravenous  Once 03/31/23 0212 03/31/23 0215   03/31/23 0300  ceFEPIme (MAXIPIME) 2 g in sodium chloride 0.9 % 100 mL IVPB  Status:  Discontinued        2 g 200 mL/hr over 30 Minutes Intravenous  Once 03/31/23 0212 03/31/23 0215   03/31/23 0300  ceFEPIme (MAXIPIME) 2 g in sodium chloride 0.9 % 100 mL IVPB        2 g 200 mL/hr over 30 Minutes Intravenous Every 8 hours 03/31/23 0217     03/30/23 1600  cefTRIAXone (ROCEPHIN) 2 g in sodium chloride 0.9 % 100 mL IVPB        2 g 200 mL/hr over 30 Minutes Intravenous  Once 03/30/23 1549 03/30/23 1646       Objective:  Vital Signs  Vitals:   03/31/23 0117 03/31/23 0454 03/31/23 0735 03/31/23 0858  BP: (!) 148/76 (!) 108/58 118/67 118/67   Pulse: (!) 110 93 91 91  Resp: 20 16 18    Temp: (!) 102 F (38.9 C) 98.5 F (36.9 C) 98.6 F (37 C)   TempSrc: Oral Oral Oral   SpO2: 98% 95% 96%   Weight:      Height:        Intake/Output Summary (Last 24 hours) at 03/31/2023 1010 Last data filed at 03/31/2023 0738 Gross per 24 hour  Intake 1624.58 ml  Output 450 ml  Net 1174.58 ml   Filed Weights   03/30/23 0905  Weight: 104.3 kg    General appearance: Awake alert.  In no distress Resp: Clear to auscultation bilaterally.  Normal effort Cardio: S1-S2 is normal regular.  No S3-S4.  No rubs murmurs or bruit GI: Abdomen is soft.  Nontender nondistended.  Bowel sounds are present normal.  No masses organomegaly Extremities: Improvement  in erythema over the right lower extremity.  Warm to touch.  Weak but palpable pulses in the dorsalis pedis bilaterally Neurologic: Alert and oriented x3.  No focal neurological deficits.    Lab Results:  Data Reviewed: I have personally reviewed following labs and reports of the imaging studies  CBC: Recent Labs  Lab 03/30/23 0910 03/31/23 0439  WBC 10.9* 9.5  NEUTROABS 9.7*  --   HGB 14.6 13.0  HCT 42.5 40.2  MCV 92.4 96.2  PLT 126* 129*    Basic Metabolic Panel: Recent Labs  Lab 03/30/23 0910 03/31/23 0439  NA 133* 131*  K 3.8 3.4*  CL 100 101  CO2 20* 20*  GLUCOSE 203* 140*  BUN 18 18  CREATININE 1.12 0.85  CALCIUM 9.1 8.5*    GFR: Estimated Creatinine Clearance: 98 mL/min (by C-G formula based on SCr of 0.85 mg/dL).  Liver Function Tests: Recent Labs  Lab 03/30/23 0910 03/31/23 0439  AST 26 33  ALT 25 20  ALKPHOS 48 40  BILITOT 1.2 1.2  PROT 7.2 6.7  ALBUMIN 3.7 3.3*      Coagulation Profile: Recent Labs  Lab 03/30/23 0910  INR 1.1     HbA1C: Recent Labs    03/31/23 0439  HGBA1C 8.5*    CBG: Recent Labs  Lab 03/31/23 0733  GLUCAP 115*     Recent Results (from the past 240 hours)  Culture, blood (Routine x 2)     Status: None  (Preliminary result)   Collection Time: 03/30/23  9:10 AM   Specimen: BLOOD  Result Value Ref Range Status   Specimen Description   Final    BLOOD RIGHT ANTECUBITAL Performed at [facility], [address] [place], [place] [address]    Special Requests   Final    BOTTLES DRAWN AEROBIC AND ANAEROBIC Blood Culture adequate volume Performed at [facility], [address] [place], [place] [address]    Culture   Final    NO GROWTH < 24 HOURS Performed at [facility], [address], [place], [place] [address]    Report Status PENDING  Incomplete  Resp panel by RT-PCR (RSV, Flu A&B, Covid) Anterior Nasal Swab     Status: None   Collection Time: 03/30/23  9:10 AM   Specimen: Anterior Nasal Swab  Result Value Ref Range Status   SARS Coronavirus 2 by RT PCR NEGATIVE NEGATIVE Final    Comment: (NOTE) SARS-CoV-2 target nucleic acids are NOT DETECTED.  The SARS-CoV-2 RNA is generally detectable in upper respiratory specimens during the acute phase of infection. The lowest concentration of SARS-CoV-2 viral copies this assay can detect is 138 copies/mL. A negative result does not preclude SARS-Cov-2 infection and should not be used as the sole basis for treatment or other patient management decisions. A negative result may occur with  improper specimen collection/handling, submission of specimen other than nasopharyngeal swab, presence of viral mutation(s) within the areas targeted by this assay, and inadequate number of viral copies(<138 copies/mL). A negative result must be combined with clinical observations, patient history, and epidemiological information. The expected result is Negative.  Fact Sheet for Patients:  BloggerCourse.com  Fact Sheet for Healthcare Providers:  SeriousBroker.it  This test is no t yet approved or cleared by the [place] FDA and  has been authorized for detection and/or  diagnosis of SARS-CoV-2 by FDA under an Emergency Use Authorization (EUA). This EUA will remain  in effect (meaning  this test can be used) for the duration of the COVID-19 declaration under Section 564(b)(1) of the Act, 21 U.S.C.section 360bbb-3(b)(1), unless the authorization is terminated  or revoked sooner.       Influenza A by PCR NEGATIVE NEGATIVE Final   Influenza B by PCR NEGATIVE NEGATIVE Final    Comment: (NOTE) The Xpert Xpress SARS-CoV-2/FLU/RSV plus assay is intended as an aid in the diagnosis of influenza from Nasopharyngeal swab specimens and should not be used as a sole basis for treatment. Nasal washings and aspirates are unacceptable for Xpert Xpress SARS-CoV-2/FLU/RSV testing.  Fact Sheet for Patients: BloggerCourse.com  Fact Sheet for Healthcare Providers: SeriousBroker.it  This test is not yet approved or cleared by the [place] FDA and has been authorized for detection and/or diagnosis of SARS-CoV-2 by FDA under an Emergency Use Authorization (EUA). This EUA will remain in effect (meaning this test can be used) for the duration of the COVID-19 declaration under Section 564(b)(1) of the Act, 21 U.S.C. section 360bbb-3(b)(1), unless the authorization is terminated or revoked.     Resp Syncytial Virus by PCR NEGATIVE NEGATIVE Final    Comment: (NOTE) Fact Sheet for Patients: BloggerCourse.com  Fact Sheet for Healthcare Providers: SeriousBroker.it  This test is not yet approved or cleared by the [place] FDA and has been authorized for detection and/or diagnosis of SARS-CoV-2 by FDA under an Emergency Use Authorization (EUA). This EUA will remain in effect (meaning this test can be used) for the duration of the COVID-19 declaration under Section 564(b)(1) of the Act, 21 U.S.C. section 360bbb-3(b)(1), unless the authorization is terminated  or revoked.  Performed at [facility], [address] [place], [place] [address]   Culture, blood (Routine x 2)     Status: None (Preliminary result)   Collection Time: 03/30/23  3:33 PM   Specimen: BLOOD  Result Value Ref Range Status   Specimen Description   Final    BLOOD LEFT ANTECUBITAL Performed at [facility], [address] [place], [place] [address]    Special Requests   Final    BOTTLES DRAWN AEROBIC AND ANAEROBIC Blood Culture adequate volume Performed at [facility], [address] [place], [place] [address]    Culture   Final    NO GROWTH < 24 HOURS Performed at [facility], [address] [place], [place] [address]    Report Status PENDING  Incomplete      Radiology Studies: DG Foot Complete Right Result Date: 03/30/2023 CLINICAL DATA:  Open wound, fifth toe EXAM: RIGHT FOOT COMPLETE - 3+ VIEW COMPARISON:  09/05/2022 FINDINGS: Stable amputation across proximal phalanx second toe. No fracture, dislocation, or other acute bone abnormality. No areas of focal cortical destruction. Calcaneal spurs. No subcutaneous gas or radiodense foreign body. Scattered arterial calcifications. IMPRESSION: 1. No acute findings. 2. Second toe amputation. Electronically Signed   By: Corlis Leak M.D.   On: 03/30/2023 15:48   DG Chest Portable 1 View Result Date: 03/30/2023 CLINICAL DATA:  Weakness, fever. EXAM: PORTABLE CHEST 1 VIEW COMPARISON:  September 30, 2020. FINDINGS: Stable cardiomediastinal silhouette. Left-sided defibrillator is again noted. Lungs are clear. Bony thorax is unremarkable. IMPRESSION: No active disease. Electronically Signed   By: Lupita Raider M.D.   On: 03/30/2023 10:58       LOS: 0 days   Herlinda Heady Rito Ehrlich  [facility] Pager on [email or web address]  03/31/2023, 10:10 AM

## 2023-03-31 NOTE — Plan of Care (Signed)
  Problem: Health Behavior/Discharge Planning: Goal: Ability to manage health-related needs will improve Outcome: Progressing   Problem: Activity: Goal: Risk for activity intolerance will decrease Outcome: Progressing   Problem: Nutrition: Goal: Adequate nutrition will be maintained Outcome: Progressing   Problem: Coping: Goal: Level of anxiety will decrease Outcome: Progressing   

## 2023-03-31 NOTE — Progress Notes (Signed)
   03/31/23 2214  BiPAP/CPAP/SIPAP  $ Non-Invasive Home Ventilator  Initial  $ Face Mask Medium Yes  BiPAP/CPAP/SIPAP Pt Type Adult  BiPAP/CPAP/SIPAP DREAMSTATIOND  Mask Type Full face mask  Mask Size Medium  FiO2 (%) 21 %  Patient Home Equipment No  Auto Titrate Yes (20/13 per pt his home settings)  BiPAP/CPAP /SiPAP Vitals  SpO2 97 %  Bilateral Breath Sounds Clear;Diminished

## 2023-03-31 NOTE — Progress Notes (Signed)
Pharmacy Antibiotic Note  Jon Jones is a 68 y.o. male admitted on 03/30/2023 with sepsis due to RLE cellulitis.  In the ED, patient received Ceftriaxone 2gm IV x 1 dose.  Pharmacy has been consulted for Vancomycin and Cefepime dosing.  Plan: Vancomycin 2gm IV x 1 dose followed by Vancomycin 750 mg IV Q 12 hrs. Goal AUC 400-550.  Expected AUC: 536.1  SCr used: 1.12 Cefepime 2g IV q8h Follow renal function F/u culture results & sensitivities  Height: 5\' 6"  (167.6 cm) Weight: 104.3 kg (230 lb) IBW/kg (Calculated) : 63.8  Temp (24hrs), Avg:99.5 F (37.5 C), Min:98.3 F (36.8 C), Max:102 F (38.9 C)  Recent Labs  Lab 03/30/23 0910  WBC 10.9*  CREATININE 1.12  LATICACIDVEN 1.7    Estimated Creatinine Clearance: 74.4 mL/min (by C-G formula based on SCr of 1.12 mg/dL).    No Known Allergies  Antimicrobials this admission: 1/17 Ceftriaxone x 1 1/18 Cefepime >>   1/18 Vancomycin >.  Dose adjustments this admission:    Microbiology results: 1/17 BCx:      Thank you for allowing pharmacy to be a part of this patient's care.  Maryellen Pile, PharmD 03/31/2023 2:21 AM

## 2023-04-01 DIAGNOSIS — L0390 Cellulitis, unspecified: Secondary | ICD-10-CM | POA: Diagnosis not present

## 2023-04-01 DIAGNOSIS — A419 Sepsis, unspecified organism: Secondary | ICD-10-CM | POA: Diagnosis not present

## 2023-04-01 DIAGNOSIS — L03115 Cellulitis of right lower limb: Secondary | ICD-10-CM

## 2023-04-01 DIAGNOSIS — D696 Thrombocytopenia, unspecified: Secondary | ICD-10-CM | POA: Diagnosis not present

## 2023-04-01 LAB — BASIC METABOLIC PANEL
Anion gap: 8 (ref 5–15)
BUN: 23 mg/dL (ref 8–23)
CO2: 22 mmol/L (ref 22–32)
Calcium: 8.7 mg/dL — ABNORMAL LOW (ref 8.9–10.3)
Chloride: 105 mmol/L (ref 98–111)
Creatinine, Ser: 0.97 mg/dL (ref 0.61–1.24)
GFR, Estimated: >60 mL/min (ref >60)
Glucose, Bld: 149 mg/dL — ABNORMAL HIGH (ref 70–99)
Potassium: 4.1 mmol/L (ref 3.5–5.1)
Sodium: 135 mmol/L (ref 135–145)

## 2023-04-01 LAB — CBC
HCT: 40.9 % (ref 39.0–52.0)
Hemoglobin: 12.9 g/dL — ABNORMAL LOW (ref 13.0–17.0)
MCH: 30.7 pg (ref 26.0–34.0)
MCHC: 31.5 g/dL (ref 30.0–36.0)
MCV: 97.4 fL (ref 80.0–100.0)
Platelets: 128 10*3/uL — ABNORMAL LOW (ref 150–400)
RBC: 4.2 MIL/uL — ABNORMAL LOW (ref 4.22–5.81)
RDW: 14.1 % (ref 11.5–15.5)
WBC: 6.5 10*3/uL (ref 4.0–10.5)
nRBC: 0 % (ref 0.0–0.2)

## 2023-04-01 LAB — GLUCOSE, CAPILLARY
Glucose-Capillary: 154 mg/dL — ABNORMAL HIGH (ref 70–99)
Glucose-Capillary: 155 mg/dL — ABNORMAL HIGH (ref 70–99)
Glucose-Capillary: 170 mg/dL — ABNORMAL HIGH (ref 70–99)
Glucose-Capillary: 197 mg/dL — ABNORMAL HIGH (ref 70–99)

## 2023-04-01 LAB — MAGNESIUM: Magnesium: 2.3 mg/dL (ref 1.7–2.4)

## 2023-04-01 MED ORDER — VANCOMYCIN HCL 750 MG IV SOLR
750.0000 mg | Freq: Two times a day (BID) | INTRAVENOUS | Status: DC
  Filled 2023-04-01: qty 15

## 2023-04-01 MED ORDER — CEFADROXIL 500 MG PO CAPS
500.0000 mg | ORAL_CAPSULE | Freq: Two times a day (BID) | ORAL | Status: DC
  Administered 2023-04-01 – 2023-04-02 (×3): 500 mg via ORAL
  Filled 2023-04-01 (×3): qty 1

## 2023-04-01 MED ORDER — DOXYCYCLINE HYCLATE 100 MG PO TABS
100.0000 mg | ORAL_TABLET | Freq: Two times a day (BID) | ORAL | Status: DC
  Administered 2023-04-01 – 2023-04-02 (×3): 100 mg via ORAL
  Filled 2023-04-01 (×3): qty 1

## 2023-04-01 NOTE — Progress Notes (Signed)
   04/01/23 2206  BiPAP/CPAP/SIPAP  BiPAP/CPAP/SIPAP Pt Type Adult (pt prefer self placement machine all ready to be used. advised to let RN know if help needed)  BiPAP/CPAP/SIPAP DREAMSTATIOND  Mask Type Full face mask  Mask Size Medium  FiO2 (%) 21 %  Patient Home Equipment No  Auto Titrate Yes (20/13)  CPAP/SIPAP surface wiped down Yes  BiPAP/CPAP /SiPAP Vitals  Pulse Rate 73  Resp 17  SpO2 97 %  Bilateral Breath Sounds Clear;Diminished  MEWS Score/Color  MEWS Score 0  MEWS Score Color Green

## 2023-04-01 NOTE — Progress Notes (Signed)
[facility] PROGRESS NOTE   Jon Jones ZOX:[id number] DOB: August 19, 1955 DOA: 03/30/2023  PCP: Pcp, No  Brief History: 68 y.o. male with medical history significant of CAD, ischemic cardiomyopathy with reduced EF 35% status post ICD placement, chronic tobacco use, peripheral vascular disease, non-insulin-dependent DM type II, hyperlipidemia, peripheral neuropathy, OSA and gout presented to emergency department complaining of generalized weakness and fatigue and fever.  He was noted to have right lower extremity erythema which was thought to be the source of his infection.  He was hospitalized for further management of cellulitis.    Consultants: None  Procedures: None    Subjective/Interval History: Patient mentions that he feels better.  His leg feels better and the redness has almost resolved.  No nausea vomiting.    Assessment/Plan:  Right lower extremity cellulitis/sepsis, present on admission. Patient was noted to be febrile tachycardic and had a source of infection.  He also had leukocytosis. Patient placed on broad-spectrum antibiotics with vancomycin and cefepime.   Patient's erythema appears to have improved in the right lower extremity.  Fever has subsided.  WBC noted to be normal.  Blood cultures are still pending.   Okay to transition to oral antibiotics today.  If blood cultures remain without any growth till tomorrow he should be able to go home then.  Hyponatremia and hypokalemia Supplemented.  Thrombocytopenia Stable.  Chronic systolic CHF EF known to be 35%.  He is status post ICD. Seems to be stable from a volume standpoint.  Will be cautious using IV fluids going forward. Holding Entresto and spironolactone for now.  Metoprolol being continued. Should be able to resume Entresto and spironolactone at discharge.    History of coronary artery disease He is stable from cardiac standpoint.  Noted to be on aspirin Plavix statin beta-blocker.  Diabetes  mellitus type 2, with diabetic neuropathy HbA1c is 8.5.  Monitor CBGs.  SSI.  Jardiance currently on hold. Continue gabapentin  History of peripheral vascular disease/hyperlipidemia Stable.  Continue statin.  History of obstructive sleep apnea CPAP at nighttime.  Obesity Estimated body mass index is 37.12 kg/m as calculated from the following:   Height as of this encounter: 5\' 6"  (1.676 m).   Weight as of this encounter: 104.3 kg.   DVT Prophylaxis: Lovenox Code Status: Full code Family Communication: Discussed with patient Disposition Plan: Anticipate discharge tomorrow.    Medications: Scheduled:  aspirin EC  81 mg Oral Daily   atorvastatin  80 mg Oral QPM   clopidogrel  75 mg Oral Daily   enoxaparin (LOVENOX) injection  50 mg Subcutaneous Q24H   gabapentin  300 mg Oral QHS   influenza vaccine adjuvanted  0.5 mL Intramuscular Tomorrow-1000   insulin aspart  0-5 Units Subcutaneous QHS   insulin aspart  0-6 Units Subcutaneous TID WC   metoprolol tartrate  100 mg Oral BID   pneumococcal 20-valent conjugate vaccine  0.5 mL Intramuscular Tomorrow-1000   sodium chloride flush  3 mL Intravenous Q12H   Continuous:  ceFEPime (MAXIPIME) IV 2 g (04/01/23 0228)   vancomycin     WJX:BJYNWGNFAOZHY **OR** acetaminophen, ondansetron **OR** ondansetron (ZOFRAN) IV, senna-docusate, sodium chloride flush  Antibiotics: Anti-infectives (From admission, onward)    Start     Dose/Rate Route Frequency Ordered Stop   04/01/23 1500  vancomycin (VANCOCIN) 750 mg in sodium chloride 0.9 % 250 mL IVPB        750 mg 250 mL/hr over 60 Minutes Intravenous Every 12 hours 04/01/23 0628  03/31/23 1500  vancomycin (VANCOREADY) IVPB 750 mg/150 mL  Status:  Discontinued        750 mg 150 mL/hr over 60 Minutes Intravenous Every 12 hours 03/31/23 0221 03/31/23 0933   03/31/23 1500  vancomycin (VANCOCIN) 750 mg in sodium chloride 0.9 % 500 mL IVPB  Status:  Discontinued        750 mg 253.8 mL/hr  over 120 Minutes Intravenous Every 12 hours 03/31/23 0928 03/31/23 0929   03/31/23 1500  vancomycin (VANCOCIN) 750 mg in sodium chloride 0.9 % 250 mL IVPB  Status:  Discontinued        750 mg 265 mL/hr over 60 Minutes Intravenous Every 12 hours 03/31/23 0933 04/01/23 0627   03/31/23 1030  vancomycin (VANCOREADY) IVPB 750 mg/150 mL  Status:  Discontinued        750 mg 150 mL/hr over 60 Minutes Intravenous Every 24 hours 03/31/23 0932 03/31/23 0932   03/31/23 0315  vancomycin (VANCOREADY) IVPB 2000 mg/400 mL        2,000 mg 200 mL/hr over 120 Minutes Intravenous  Once 03/31/23 0216 03/31/23 0623   03/31/23 0300  vancomycin (VANCOCIN) IVPB 1000 mg/200 mL premix  Status:  Discontinued        1,000 mg 200 mL/hr over 60 Minutes Intravenous  Once 03/31/23 0212 03/31/23 0215   03/31/23 0300  ceFEPIme (MAXIPIME) 2 g in sodium chloride 0.9 % 100 mL IVPB  Status:  Discontinued        2 g 200 mL/hr over 30 Minutes Intravenous  Once 03/31/23 0212 03/31/23 0215   03/31/23 0300  ceFEPIme (MAXIPIME) 2 g in sodium chloride 0.9 % 100 mL IVPB        2 g 200 mL/hr over 30 Minutes Intravenous Every 8 hours 03/31/23 0217     03/30/23 1600  cefTRIAXone (ROCEPHIN) 2 g in sodium chloride 0.9 % 100 mL IVPB        2 g 200 mL/hr over 30 Minutes Intravenous  Once 03/30/23 1549 03/30/23 1646       Objective:  Vital Signs  Vitals:   03/31/23 1715 03/31/23 2046 03/31/23 2214 04/01/23 0502  BP: (!) 119/108 122/66  116/69  Pulse: (!) 112 83  81  Resp: 14 18  18   Temp: 98.3 F (36.8 C) 98.5 F (36.9 C)  98.3 F (36.8 C)  TempSrc: Oral Oral  Oral  SpO2: 100% 96% 97% 98%  Weight:      Height:        Intake/Output Summary (Last 24 hours) at 04/01/2023 0946 Last data filed at 04/01/2023 0700 Gross per 24 hour  Intake 1190 ml  Output 1600 ml  Net -410 ml   Filed Weights   03/30/23 0905  Weight: 104.3 kg    General appearance: Awake alert.  In no distress Resp: Clear to auscultation bilaterally.   Normal effort Cardio: S1-S2 is normal regular.  No S3-S4.  No rubs murmurs or bruit GI: Abdomen is soft.  Nontender nondistended.  Bowel sounds are present normal.  No masses organomegaly Extremities: Erythema in the right lower extremity is significantly improved.  Lab Results:  Data Reviewed: I have personally reviewed following labs and reports of the imaging studies  CBC: Recent Labs  Lab 03/30/23 0910 03/31/23 0439 04/01/23 0352  WBC 10.9* 9.5 6.5  NEUTROABS 9.7*  --   --   HGB 14.6 13.0 12.9*  HCT 42.5 40.2 40.9  MCV 92.4 96.2 97.4  PLT 126* 129* 128*  Basic Metabolic Panel: Recent Labs  Lab 03/30/23 0910 03/31/23 0439 04/01/23 0352  NA 133* 131* 135  K 3.8 3.4* 4.1  CL 100 101 105  CO2 20* 20* 22  GLUCOSE 203* 140* 149*  BUN 18 18 23   CREATININE 1.12 0.85 0.97  CALCIUM 9.1 8.5* 8.7*  MG  --  2.1 2.3    GFR: Estimated Creatinine Clearance: 85.9 mL/min (by C-G formula based on SCr of 0.97 mg/dL).  Liver Function Tests: Recent Labs  Lab 03/30/23 0910 03/31/23 0439  AST 26 33  ALT 25 20  ALKPHOS 48 40  BILITOT 1.2 1.2  PROT 7.2 6.7  ALBUMIN 3.7 3.3*      Coagulation Profile: Recent Labs  Lab 03/30/23 0910  INR 1.1     HbA1C: Recent Labs    03/31/23 0439  HGBA1C 8.5*    CBG: Recent Labs  Lab 03/31/23 0733 03/31/23 1125 03/31/23 1714 03/31/23 2148 04/01/23 0753  GLUCAP 115* 154* 164* 169* 154*     Recent Results (from the past 240 hours)  Culture, blood (Routine x 2)     Status: None (Preliminary result)   Collection Time: 03/30/23  9:10 AM   Specimen: BLOOD  Result Value Ref Range Status   Specimen Description   Final    BLOOD RIGHT ANTECUBITAL Performed at [facility], [address] [place], [place] [address]    Special Requests   Final    BOTTLES DRAWN AEROBIC AND ANAEROBIC Blood Culture adequate volume Performed at [facility], [address] [place], [place] [address]    Culture    Final    NO GROWTH 2 DAYS Performed at [facility], [address], [place], [place] [address]    Report Status PENDING  Incomplete  Resp panel by RT-PCR (RSV, Flu A&B, Covid) Anterior Nasal Swab     Status: None   Collection Time: 03/30/23  9:10 AM   Specimen: Anterior Nasal Swab  Result Value Ref Range Status   SARS Coronavirus 2 by RT PCR NEGATIVE NEGATIVE Final    Comment: (NOTE) SARS-CoV-2 target nucleic acids are NOT DETECTED.  The SARS-CoV-2 RNA is generally detectable in upper respiratory specimens during the acute phase of infection. The lowest concentration of SARS-CoV-2 viral copies this assay can detect is 138 copies/mL. A negative result does not preclude SARS-Cov-2 infection and should not be used as the sole basis for treatment or other patient management decisions. A negative result may occur with  improper specimen collection/handling, submission of specimen other than nasopharyngeal swab, presence of viral mutation(s) within the areas targeted by this assay, and inadequate number of viral copies(<138 copies/mL). A negative result must be combined with clinical observations, patient history, and epidemiological information. The expected result is Negative.  Fact Sheet for Patients:  BloggerCourse.com  Fact Sheet for Healthcare Providers:  SeriousBroker.it  This test is no t yet approved or cleared by the [place] FDA and  has been authorized for detection and/or diagnosis of SARS-CoV-2 by FDA under an Emergency Use Authorization (EUA). This EUA will remain  in effect (meaning this test can be used) for the duration of the COVID-19 declaration under Section 564(b)(1) of the Act, 21 U.S.C.section 360bbb-3(b)(1), unless the authorization is terminated  or revoked sooner.       Influenza A by PCR NEGATIVE NEGATIVE Final   Influenza B by PCR NEGATIVE NEGATIVE Final    Comment: (NOTE) The Xpert Xpress  SARS-CoV-2/FLU/RSV plus  assay is intended as an aid in the diagnosis of influenza from Nasopharyngeal swab specimens and should not be used as a sole basis for treatment. Nasal washings and aspirates are unacceptable for Xpert Xpress SARS-CoV-2/FLU/RSV testing.  Fact Sheet for Patients: BloggerCourse.com  Fact Sheet for Healthcare Providers: SeriousBroker.it  This test is not yet approved or cleared by the [place] FDA and has been authorized for detection and/or diagnosis of SARS-CoV-2 by FDA under an Emergency Use Authorization (EUA). This EUA will remain in effect (meaning this test can be used) for the duration of the COVID-19 declaration under Section 564(b)(1) of the Act, 21 U.S.C. section 360bbb-3(b)(1), unless the authorization is terminated or revoked.     Resp Syncytial Virus by PCR NEGATIVE NEGATIVE Final    Comment: (NOTE) Fact Sheet for Patients: BloggerCourse.com  Fact Sheet for Healthcare Providers: SeriousBroker.it  This test is not yet approved or cleared by the [place] FDA and has been authorized for detection and/or diagnosis of SARS-CoV-2 by FDA under an Emergency Use Authorization (EUA). This EUA will remain in effect (meaning this test can be used) for the duration of the COVID-19 declaration under Section 564(b)(1) of the Act, 21 U.S.C. section 360bbb-3(b)(1), unless the authorization is terminated or revoked.  Performed at [facility], [address] [place], [place] [address]   Culture, blood (Routine x 2)     Status: None (Preliminary result)   Collection Time: 03/30/23  3:33 PM   Specimen: BLOOD  Result Value Ref Range Status   Specimen Description   Final    BLOOD LEFT ANTECUBITAL Performed at [facility], [address] [place], [place] [address]    Special Requests   Final    BOTTLES DRAWN AEROBIC AND  ANAEROBIC Blood Culture adequate volume Performed at [facility], [address] [place], [place] [address]    Culture   Final    NO GROWTH 2 DAYS Performed at [facility], [address] [place], [place] [address]    Report Status PENDING  Incomplete      Radiology Studies: DG Foot Complete Right Result Date: 03/30/2023 CLINICAL DATA:  Open wound, fifth toe EXAM: RIGHT FOOT COMPLETE - 3+ VIEW COMPARISON:  09/05/2022 FINDINGS: Stable amputation across proximal phalanx second toe. No fracture, dislocation, or other acute bone abnormality. No areas of focal cortical destruction. Calcaneal spurs. No subcutaneous gas or radiodense foreign body. Scattered arterial calcifications. IMPRESSION: 1. No acute findings. 2. Second toe amputation. Electronically Signed   By: Corlis Leak M.D.   On: 03/30/2023 15:48   DG Chest Portable 1 View Result Date: 03/30/2023 CLINICAL DATA:  Weakness, fever. EXAM: PORTABLE CHEST 1 VIEW COMPARISON:  September 30, 2020. FINDINGS: Stable cardiomediastinal silhouette. Left-sided defibrillator is again noted. Lungs are clear. Bony thorax is unremarkable. IMPRESSION: No active disease. Electronically Signed   By: Lupita Raider M.D.   On: 03/30/2023 10:58       LOS: 1 day   Jon Jones  [facility] Pager on [email or web address]  04/01/2023, 9:46 AM

## 2023-04-02 ENCOUNTER — Other Ambulatory Visit (HOSPITAL_COMMUNITY): Payer: Self-pay

## 2023-04-02 DIAGNOSIS — A419 Sepsis, unspecified organism: Secondary | ICD-10-CM | POA: Diagnosis not present

## 2023-04-02 DIAGNOSIS — L0390 Cellulitis, unspecified: Secondary | ICD-10-CM | POA: Diagnosis not present

## 2023-04-02 LAB — CBC
HCT: 41.6 % (ref 39.0–52.0)
Hemoglobin: 13.1 g/dL (ref 13.0–17.0)
MCH: 30.7 pg (ref 26.0–34.0)
MCHC: 31.5 g/dL (ref 30.0–36.0)
MCV: 97.4 fL (ref 80.0–100.0)
Platelets: 142 10*3/uL — ABNORMAL LOW (ref 150–400)
RBC: 4.27 MIL/uL (ref 4.22–5.81)
RDW: 13.7 % (ref 11.5–15.5)
WBC: 5.9 10*3/uL (ref 4.0–10.5)
nRBC: 0 % (ref 0.0–0.2)

## 2023-04-02 LAB — BASIC METABOLIC PANEL
Anion gap: 9 (ref 5–15)
BUN: 22 mg/dL (ref 8–23)
CO2: 23 mmol/L (ref 22–32)
Calcium: 8.9 mg/dL (ref 8.9–10.3)
Chloride: 105 mmol/L (ref 98–111)
Creatinine, Ser: 1.05 mg/dL (ref 0.61–1.24)
GFR, Estimated: >60 mL/min (ref >60)
Glucose, Bld: 110 mg/dL — ABNORMAL HIGH (ref 70–99)
Potassium: 4.2 mmol/L (ref 3.5–5.1)
Sodium: 137 mmol/L (ref 135–145)

## 2023-04-02 LAB — GLUCOSE, CAPILLARY: Glucose-Capillary: 156 mg/dL — ABNORMAL HIGH (ref 70–99)

## 2023-04-02 MED ORDER — DOXYCYCLINE HYCLATE 100 MG PO TABS
100.0000 mg | ORAL_TABLET | Freq: Two times a day (BID) | ORAL | 0 refills | Status: AC
  Filled 2023-04-02: qty 10, 5d supply, fill #0

## 2023-04-02 MED ORDER — CEFADROXIL 500 MG PO CAPS
500.0000 mg | ORAL_CAPSULE | Freq: Two times a day (BID) | ORAL | 0 refills | Status: AC
  Filled 2023-04-02: qty 10, 5d supply, fill #0

## 2023-04-02 NOTE — Discharge Summary (Signed)
[facility]  Physician Discharge Summary   Patient ID: Jon Jones MRN: [id number] DOB/AGE: 1956/02/12 68 y.o.  Admit date: 03/30/2023 Discharge date: 04/02/2023    PCP: Pcp, No  DISCHARGE DIAGNOSES:    Sepsis due to cellulitis ([name]) Right lower extremity cellulitis   Thrombocytopenia ([name])   OSA (obstructive sleep apnea)   PVD (peripheral vascular disease) ([name])   Non-insulin dependent type 2 diabetes mellitus ([name])   Chronic systolic CHF (congestive heart failure) reduced EF 35% ([name])   History of CAD (coronary artery disease)   Hyperlipidemia   Peripheral neuropathy   Hyponatremia   RECOMMENDATIONS FOR OUTPATIENT FOLLOW UP: Follow-up with PCP   Home Health: None Equipment/Devices: None  CODE STATUS: Full code  DISCHARGE CONDITION: fair  Diet recommendation: As before  INITIAL HISTORY: 68 y.o. male with medical history significant of CAD, ischemic cardiomyopathy with reduced EF 35% status post ICD placement, chronic tobacco use, peripheral vascular disease, non-insulin-dependent DM type II, hyperlipidemia, peripheral neuropathy, OSA and gout presented to emergency department complaining of generalized weakness and fatigue and fever.  He was noted to have right lower extremity erythema which was thought to be the source of his infection.  He was hospitalized for further management of cellulitis.   HOSPITAL COURSE:   Right lower extremity cellulitis/sepsis, present on admission. Patient was noted to be febrile tachycardic and had a source of infection.  He also had leukocytosis. Patient placed on broad-spectrum antibiotics with vancomycin and cefepime.   Patient started improving.  Blood cultures negative so far.  Changed over to oral antibiotics and will be discharged on cefadroxil and doxycycline.   Hyponatremia and hypokalemia Supplemented.   Thrombocytopenia Stable.   Chronic systolic CHF EF known to be 35%.  He is status post ICD. Initially his  Entresto and spironolactone were held.  With correction and his electrolytes these can be resumed at discharge.  Continue with metoprolol as well.   History of coronary artery disease He is stable from cardiac standpoint.  Noted to be on aspirin Plavix statin beta-blocker.   Diabetes mellitus type 2, with diabetic neuropathy HbA1c is 8.5.  May resume home medications Continue gabapentin   History of peripheral vascular disease/hyperlipidemia Stable.  Continue statin.   History of obstructive sleep apnea CPAP at nighttime.   Obesity Estimated body mass index is 37.12 kg/m as calculated from the following:   Height as of this encounter: 5\' 6"  (1.676 m).   Weight as of this encounter: 104.3 kg.    Patient is stable.  Okay for discharge to home.   PERTINENT LABS:  The results of significant diagnostics from this hospitalization (including imaging, microbiology, ancillary and laboratory) are listed below for reference.    Microbiology: Recent Results (from the past 240 hours)  Culture, blood (Routine x 2)     Status: None (Preliminary result)   Collection Time: 03/30/23  9:10 AM   Specimen: BLOOD  Result Value Ref Range Status   Specimen Description   Final    BLOOD RIGHT ANTECUBITAL Performed at [facility], [address] [place], [place] [address]    Special Requests   Final    BOTTLES DRAWN AEROBIC AND ANAEROBIC Blood Culture adequate volume Performed at [facility], [address] [place], [place] [address]    Culture   Final    NO GROWTH 3 DAYS Performed at [facility], [address] [place], [place] [address]    Report Status PENDING  Incomplete  Resp panel by RT-PCR (RSV, Flu A&B, Covid) Anterior Nasal Swab     Status: None   Collection Time: 03/30/23  9:10 AM   Specimen: Anterior Nasal Swab  Result Value Ref Range Status   SARS Coronavirus 2 by RT PCR NEGATIVE NEGATIVE Final    Comment: (NOTE) SARS-CoV-2 target nucleic acids  are NOT DETECTED.  The SARS-CoV-2 RNA is generally detectable in upper respiratory specimens during the acute phase of infection. The lowest concentration of SARS-CoV-2 viral copies this assay can detect is 138 copies/mL. A negative result does not preclude SARS-Cov-2 infection and should not be used as the sole basis for treatment or other patient management decisions. A negative result may occur with  improper specimen collection/handling, submission of specimen other than nasopharyngeal swab, presence of viral mutation(s) within the areas targeted by this assay, and inadequate number of viral copies(<138 copies/mL). A negative result must be combined with clinical observations, patient history, and epidemiological information. The expected result is Negative.  Fact Sheet for Patients:  BloggerCourse.com  Fact Sheet for Healthcare Providers:  SeriousBroker.it  This test is no t yet approved or cleared by the [place] FDA and  has been authorized for detection and/or diagnosis of SARS-CoV-2 by FDA under an Emergency Use Authorization (EUA). This EUA will remain  in effect (meaning this test can be used) for the duration of the COVID-19 declaration under Section 564(b)(1) of the Act, 21 U.S.C.section 360bbb-3(b)(1), unless the authorization is terminated  or revoked sooner.       Influenza A by PCR NEGATIVE NEGATIVE Final   Influenza B by PCR NEGATIVE NEGATIVE Final    Comment: (NOTE) The Xpert Xpress SARS-CoV-2/FLU/RSV plus assay is intended as an aid in the diagnosis of influenza from Nasopharyngeal swab specimens and should not be used as a sole basis for treatment. Nasal washings and aspirates are unacceptable for Xpert Xpress SARS-CoV-2/FLU/RSV testing.  Fact Sheet for Patients: BloggerCourse.com  Fact Sheet for Healthcare Providers: SeriousBroker.it  This test is  not yet approved or cleared by the [place] FDA and has been authorized for detection and/or diagnosis of SARS-CoV-2 by FDA under an Emergency Use Authorization (EUA). This EUA will remain in effect (meaning this test can be used) for the duration of the COVID-19 declaration under Section 564(b)(1) of the Act, 21 U.S.C. section 360bbb-3(b)(1), unless the authorization is terminated or revoked.     Resp Syncytial Virus by PCR NEGATIVE NEGATIVE Final    Comment: (NOTE) Fact Sheet for Patients: BloggerCourse.com  Fact Sheet for Healthcare Providers: SeriousBroker.it  This test is not yet approved or cleared by the [place] FDA and has been authorized for detection and/or diagnosis of SARS-CoV-2 by FDA under an Emergency Use Authorization (EUA). This EUA will remain in effect (meaning this test can be used) for the duration of the COVID-19 declaration under Section 564(b)(1) of the Act, 21 U.S.C. section 360bbb-3(b)(1), unless the authorization is terminated or revoked.  Performed at [facility], [address] [place], [place] [address]   Culture, blood (Routine x 2)     Status: None (Preliminary result)   Collection Time: 03/30/23  3:33 PM   Specimen: BLOOD  Result Value Ref Range Status   Specimen Description   Final    BLOOD LEFT ANTECUBITAL Performed at [facility], [address] [place], [place] [address]    Special Requests   Final    BOTTLES DRAWN AEROBIC AND ANAEROBIC Blood Culture  adequate volume Performed at [facility], [address] [place], [place] [address]    Culture   Final    NO GROWTH 3 DAYS Performed at [facility], [address] [place], [place] [address]    Report Status PENDING  Incomplete     Labs:   Basic Metabolic Panel: Recent Labs  Lab 03/30/23 0910 03/31/23 0439 04/01/23 0352 04/02/23 0307  NA 133* 131* 135 137  K 3.8 3.4* 4.1  4.2  CL 100 101 105 105  CO2 20* 20* 22 23  GLUCOSE 203* 140* 149* 110*  BUN 18 18 23 22   CREATININE 1.12 0.85 0.97 1.05  CALCIUM 9.1 8.5* 8.7* 8.9  MG  --  2.1 2.3  --    Liver Function Tests: Recent Labs  Lab 03/30/23 0910 03/31/23 0439  AST 26 33  ALT 25 20  ALKPHOS 48 40  BILITOT 1.2 1.2  PROT 7.2 6.7  ALBUMIN 3.7 3.3*    CBC: Recent Labs  Lab 03/30/23 0910 03/31/23 0439 04/01/23 0352 04/02/23 0307  WBC 10.9* 9.5 6.5 5.9  NEUTROABS 9.7*  --   --   --   HGB 14.6 13.0 12.9* 13.1  HCT 42.5 40.2 40.9 41.6  MCV 92.4 96.2 97.4 97.4  PLT 126* 129* 128* 142*    CBG: Recent Labs  Lab 04/01/23 0753 04/01/23 1127 04/01/23 1533 04/01/23 2139 04/02/23 0735  GLUCAP 154* 155* 170* 197* 156*     IMAGING STUDIES DG Foot Complete Right Result Date: 03/30/2023 CLINICAL DATA:  Open wound, fifth toe EXAM: RIGHT FOOT COMPLETE - 3+ VIEW COMPARISON:  09/05/2022 FINDINGS: Stable amputation across proximal phalanx second toe. No fracture, dislocation, or other acute bone abnormality. No areas of focal cortical destruction. Calcaneal spurs. No subcutaneous gas or radiodense foreign body. Scattered arterial calcifications. IMPRESSION: 1. No acute findings. 2. Second toe amputation. Electronically Signed   By: Corlis Leak M.D.   On: 03/30/2023 15:48   DG Chest Portable 1 View Result Date: 03/30/2023 CLINICAL DATA:  Weakness, fever. EXAM: PORTABLE CHEST 1 VIEW COMPARISON:  September 30, 2020. FINDINGS: Stable cardiomediastinal silhouette. Left-sided defibrillator is again noted. Lungs are clear. Bony thorax is unremarkable. IMPRESSION: No active disease. Electronically Signed   By: Lupita Raider M.D.   On: 03/30/2023 10:58    DISCHARGE EXAMINATION: Vitals:   04/01/23 2017 04/01/23 2126 04/01/23 2206 04/02/23 0443  BP: 122/63 103/65  125/64  Pulse: 78 73 73 75  Resp: 16  17 16   Temp: 98.3 F (36.8 C)   98.2 F (36.8 C)  TempSrc: Oral   Oral  SpO2: 97%  97% 99%  Weight:       Height:       General appearance: Awake alert.  In no distress Resp: Clear to auscultation bilaterally.  Normal effort Cardio: S1-S2 is normal regular.  No S3-S4.  No rubs murmurs or bruit GI: Abdomen is soft.  Nontender nondistended.  Bowel sounds are present normal.  No masses organomegaly Significant improvement in the right lower extremity erythema  DISPOSITION: Home  Discharge Instructions     Call MD for:  difficulty breathing, headache or visual disturbances   Complete by: As directed    Call MD for:  extreme fatigue   Complete by: As directed    Call MD for:  persistant dizziness or light-headedness   Complete by: As directed    Call MD for:  persistant nausea and vomiting   Complete by:  As directed    Call MD for:  severe uncontrolled pain   Complete by: As directed    Call MD for:  temperature >100.4   Complete by: As directed    Diet - low sodium heart healthy   Complete by: As directed    Discharge instructions   Complete by: As directed    Please follow up with your PCP within 1-2 weeks. Take your medications as prescribed.  You were cared for by a hospitalist during your hospital stay. If you have any questions about your discharge medications or the care you received while you were in the hospital after you are discharged, you can call the unit and asked to speak with the hospitalist on call if the hospitalist that took care of you is not available. Once you are discharged, your primary care physician will handle any further medical issues. Please note that NO REFILLS for any discharge medications will be authorized once you are discharged, as it is imperative that you return to your primary care physician (or establish a relationship with a primary care physician if you do not have one) for your aftercare needs so that they can reassess your need for medications and monitor your lab values. If you do not have a primary care physician, you can call [phone] for a  physician referral.   Increase activity slowly   Complete by: As directed    No wound care   Complete by: As directed          Allergies as of 04/02/2023   No Known Allergies      Medication List     TAKE these medications    Accu-Chek Aviva Plus test strip Generic drug: glucose blood TEST 3 TIMES DAILY   Accu-Chek Softclix Lancets lancets CHECK BLOOD SUGAR ONCE DAILY   acetaminophen 325 MG tablet Commonly known as: TYLENOL Take 2 tablets (650 mg total) by mouth every 6 (six) hours as needed for mild pain (or Fever >/= 101).   aspirin EC 81 MG tablet Take 81 mg by mouth daily.   atorvastatin 80 MG tablet Commonly known as: LIPITOR Take 1 tablet (80 mg total) by mouth every evening.   cefadroxil 500 MG capsule Commonly known as: DURICEF Take 1 capsule (500 mg total) by mouth 2 (two) times daily for 5 days.   clopidogrel 75 MG tablet Commonly known as: PLAVIX TAKE 1 TABLET BY MOUTH EVERY DAY   doxycycline 100 MG tablet Commonly known as: VIBRA-TABS Take 1 tablet (100 mg total) by mouth every 12 (twelve) hours for 5 days.   empagliflozin 25 MG Tabs tablet Commonly known as: Jardiance TAKE 1 TABLET(25 MG) BY MOUTH AT BEDTIME What changed:  how much to take how to take this when to take this   Entresto 24-26 MG Generic drug: sacubitril-valsartan Take 1 tablet by mouth 2 (two) times daily.   gabapentin 300 MG capsule Commonly known as: NEURONTIN TAKE 1 CAPSULE(300 MG) BY MOUTH AT BEDTIME What changed: See the new instructions.   glipiZIDE 10 MG tablet Commonly known as: GLUCOTROL TAKE 2 TABLETS(20 MG) BY MOUTH TWICE DAILY What changed:  how much to take how to take this when to take this   Janumet XR 662 [id number] 2339 MG Tb24 Generic drug: SitaGLIPtin-MetFORMIN HCl Take 1 tablet by mouth daily.   metoprolol tartrate 100 MG tablet Commonly known as: LOPRESSOR Take 100 mg by mouth 2 (two) times daily.   Misc. Devices Misc Cpap supplies-tubing  mask Dx G47.33 and  Z99.89   nitroGLYCERIN 0.4 MG SL tablet Commonly known as: NITROSTAT PLACE 1 TABLET UNDER THE TONGUE EVERY 5 MINS AS NEEDED FOR CHEST PAIN. MAXIMUM OF 3 DOSES What changed: See the new instructions.   Ozempic (0.25 or 0.5 MG/DOSE) 2 MG/1.5ML Sopn Generic drug: Semaglutide(0.25 or 0.5MG /DOS) Inject 0.25 mg into the skin once a week.   Restasis 0.05 % ophthalmic emulsion Generic drug: cycloSPORINE Place 1 drop into both eyes in the morning and at bedtime.   spironolactone 25 MG tablet Commonly known as: ALDACTONE Take 0.5 tablets (12.5 mg total) by mouth every other day.           TOTAL DISCHARGE TIME: 35 minutes  Tramell Piechota [employer] on [email or web address]  04/02/2023, 6:46 PM

## 2023-04-02 NOTE — Progress Notes (Signed)
Pt does not have shoes- waiting on wife to bring them

## 2023-04-02 NOTE — Progress Notes (Signed)
AVS reviewed w/ pt who verbalized an understanding - pt  did a quick bath at bedside - central tele called by this RN to remove pt from tele due to d/c to home- PIV removed as noted. Pt dressing for d/c to home. Pt to lobby via w/c - home w/ wife

## 2023-04-03 ENCOUNTER — Telehealth: Payer: Self-pay

## 2023-04-03 NOTE — Transitions of Care (Post Inpatient/ED Visit) (Signed)
   04/03/2023  Name: Jon Jones MRN: [id number] DOB: February 01, 1956  Today's TOC FU Call Status: Today's TOC FU Call Status:: Unsuccessful Call (1st Attempt) Unsuccessful Call (1st Attempt) Date: 04/03/23  Attempted to reach the patient regarding the most recent Inpatient/ED visit.  Follow Up Plan: Additional outreach attempts will be made to reach the patient to complete the Transitions of Care (Post Inpatient/ED visit) call.   Alyse Low, RN, BA, [facility], CRRN [facility] Coordinator, Transition of Care Ph # [phone]

## 2023-04-04 ENCOUNTER — Telehealth: Payer: Self-pay

## 2023-04-04 LAB — CULTURE, BLOOD (ROUTINE X 2)
Culture: NO GROWTH
Culture: NO GROWTH
Special Requests: ADEQUATE
Special Requests: ADEQUATE

## 2023-04-04 NOTE — Transitions of Care (Post Inpatient/ED Visit) (Signed)
   04/04/2023  Name: Delshon Kanhai MRN: [id number] DOB: 1955-12-11  Today's TOC FU Call Status: Today's TOC FU Call Status:: Unsuccessful Call (2nd Attempt) Unsuccessful Call (2nd Attempt) Date: 04/04/23  Attempted to reach the patient regarding the most recent Inpatient/ED visit.  Follow Up Plan: No further outreach attempts will be made at this time. We have been unable to contact the patient.  Alyse Low, RN, BA, [facility], CRRN [facility] Coordinator, Transition of Care Ph # [phone]

## 2023-04-05 ENCOUNTER — Telehealth: Payer: Self-pay

## 2023-04-05 NOTE — Transitions of Care (Post Inpatient/ED Visit) (Signed)
   04/05/2023  Name: Jon Jones MRN: [id number] DOB: 06/04/1955  Today's TOC FU Call Status: Today's TOC FU Call Status:: Unsuccessful Call (3rd Attempt) Unsuccessful Call (3rd Attempt) Date: 04/05/23  Attempted to reach the patient regarding the most recent Inpatient/ED visit.  Follow Up Plan: No further outreach attempts will be made at this time. We have been unable to contact the patient.  Alyse Low, RN, BA, [facility], CRRN [facility] Coordinator, Transition of Care Ph # [phone]

## 2023-04-09 ENCOUNTER — Telehealth: Payer: Self-pay | Admitting: Cardiovascular Disease

## 2023-04-09 DIAGNOSIS — E785 Hyperlipidemia, unspecified: Secondary | ICD-10-CM | POA: Diagnosis not present

## 2023-04-09 DIAGNOSIS — I10 Essential (primary) hypertension: Secondary | ICD-10-CM | POA: Diagnosis not present

## 2023-04-09 DIAGNOSIS — I509 Heart failure, unspecified: Secondary | ICD-10-CM | POA: Diagnosis not present

## 2023-04-09 DIAGNOSIS — E1140 Type 2 diabetes mellitus with diabetic neuropathy, unspecified: Secondary | ICD-10-CM | POA: Diagnosis not present

## 2023-04-09 DIAGNOSIS — E1159 Type 2 diabetes mellitus with other circulatory complications: Secondary | ICD-10-CM | POA: Diagnosis not present

## 2023-04-09 DIAGNOSIS — G4733 Obstructive sleep apnea (adult) (pediatric): Secondary | ICD-10-CM

## 2023-04-09 DIAGNOSIS — I2510 Atherosclerotic heart disease of native coronary artery without angina pectoris: Secondary | ICD-10-CM

## 2023-04-09 NOTE — Telephone Encounter (Signed)
Patient stopped in office today. Says he prev spoke with someone that was supposed to send a message back for a call back, but that was 2 weeks ago and he hadnt heard anything yet.  The company that is supposed to supply CPAP supplies went bankrupt. Insurance sent him list of companies to pick. We need to send the prescription to the new company so he can get supplies. [employer] is the company.

## 2023-04-10 NOTE — Telephone Encounter (Signed)
Called patient and notified via VM, per DPR, CPAP supply order has been sent to [facility] today. I will reach back out to patient in 3 days.

## 2023-04-11 DIAGNOSIS — L209 Atopic dermatitis, unspecified: Secondary | ICD-10-CM | POA: Diagnosis not present

## 2023-04-11 DIAGNOSIS — E119 Type 2 diabetes mellitus without complications: Secondary | ICD-10-CM | POA: Diagnosis not present

## 2023-04-13 ENCOUNTER — Encounter: Payer: Self-pay | Admitting: Family

## 2023-04-13 ENCOUNTER — Ambulatory Visit: Payer: 59 | Admitting: Family

## 2023-04-13 DIAGNOSIS — M170 Bilateral primary osteoarthritis of knee: Secondary | ICD-10-CM

## 2023-04-13 DIAGNOSIS — G8929 Other chronic pain: Secondary | ICD-10-CM

## 2023-04-13 DIAGNOSIS — M25511 Pain in right shoulder: Secondary | ICD-10-CM

## 2023-04-13 DIAGNOSIS — M75121 Complete rotator cuff tear or rupture of right shoulder, not specified as traumatic: Secondary | ICD-10-CM | POA: Diagnosis not present

## 2023-04-13 MED ORDER — LIDOCAINE HCL 1 % IJ SOLN
5.0000 mL | INTRAMUSCULAR | Status: AC | PRN
  Administered 2023-04-13: 5 mL

## 2023-04-13 MED ORDER — METHYLPREDNISOLONE ACETATE 40 MG/ML IJ SUSP
40.0000 mg | INTRAMUSCULAR | Status: AC | PRN
  Administered 2023-04-13: 40 mg via INTRA_ARTICULAR

## 2023-04-13 NOTE — Progress Notes (Signed)
Office Visit Note   [name]           Date of Birth: [birth year]           MRN: [id number] Visit Date: 04/13/2023              Requested by: No referring provider defined for this encounter. PCP: Pcp, No  Chief Complaint  Patient presents with   Right Knee - Follow-up   Left Knee - Follow-up   Right Shoulder - Follow-up      HPI: The patient is a 68 year old gentleman seen today in routine follow-up for bilateral knee osteoarthritis as well as chronic right shoulder pain.  No changes  Has been satisfied with his current treatment with Depo-Medrol injection for interval relief of pain.   Last injection was April of this year for knees. Assessment & Plan: Visit Diagnoses: No diagnosis found.  Plan: Depo-Medrol injection bilateral knees and right shoulder.  Patient tolerated well.  Follow-up as needed.  Follow-Up Instructions: Return in about 3 months (around 07/11/2023).   Ortho Exam  Patient is alert, oriented, no adenopathy, well-dressed, normal affect, normal respiratory effort. On examination of the right shoulder patient has abduction and flexion about 90 degrees.  Crepitation with range of motion.  He has good internal and external rotation with passive range of motion to 120 degrees.  No adhesive capsulitis.   Bilateral knees without edema or erythema there is medial joint line tenderness.  Collaterals and cruciates are stable.  Imaging: No results found. No images are attached to the encounter.  Labs: Lab Results  Component Value Date   HGBA1C 8.5 (H) 03/31/2023   HGBA1C 8.7 (H) 10/19/2020   HGBA1C 9.8 (H) 09/08/2020   ESRSEDRATE 14 03/30/2023   ESRSEDRATE 11 03/11/2019   CRP 13.4 (H) 03/30/2023   CRP 1.8 (H) 03/11/2019   REPTSTATUS 04/04/2023 FINAL 03/30/2023   GRAMSTAIN  07/19/2016    RARE WBC PRESENT, PREDOMINANTLY MONONUCLEAR NO ORGANISMS SEEN    CULT  03/30/2023    NO GROWTH 5 DAYS Performed at [facility], [address] [place], [place] [address]      Lab Results  Component Value Date   ALBUMIN 3.3 (L) 03/31/2023   ALBUMIN 3.7 03/30/2023   ALBUMIN 3.5 09/29/2020    Lab Results  Component Value Date   MG 2.3 04/01/2023   MG 2.1 03/31/2023   No results found for: "VD25OH"  No results found for: "PREALBUMIN"    Latest Ref Rng & Units 04/02/2023    3:07 AM 04/01/2023    3:52 AM 03/31/2023    4:39 AM  CBC EXTENDED  WBC 4.0 - 10.5 K/uL 5.9  6.5  9.5   RBC 4.22 - 5.81 MIL/uL 4.27  4.20  4.18   Hemoglobin 13.0 - 17.0 g/dL 59.5  63.8  75.6   HCT 39.0 - 52.0 % 41.6  40.9  40.2   Platelets 150 - 400 K/uL 142  128  129      There is no height or weight on file to calculate BMI.  Orders:  No orders of the defined types were placed in this encounter.  No orders of the defined types were placed in this encounter.    Procedures: Large Joint Inj: R subacromial bursa on 04/13/2023 9:40 AM Indications: pain Details: 22 G 1.5 in needle Medications: 5 mL lidocaine 1 %; 40 mg methylPREDNISolone acetate 40 MG/ML Consent was given by the patient.    Large  Joint Inj: bilateral knee on 04/13/2023 9:40 AM Indications: pain Details: 18 G 1.5 in needle, anteromedial approach Medications (Right): 5 mL lidocaine 1 %; 40 mg methylPREDNISolone acetate 40 MG/ML Medications (Left): 5 mL lidocaine 1 %; 40 mg methylPREDNISolone acetate 40 MG/ML Consent was given by the patient.      Clinical Data: No additional findings.  ROS:  All other systems negative, except as noted in the HPI. Review of Systems  Objective: Vital Signs: There were no vitals taken for this visit.  Specialty Comments:  Ct Myelogram  IMPRESSION:  1. L5-S1: Severe multifactorial spinal stenosis. Advanced bilateral  facet arthropathy with 5 mm of anterolisthesis which increases to 8  mm with standing and flexion. Bulging of the disc. Complex synovial  cyst arising from the facet joint on the left within the posterior  left side of  the spinal canal. Marked constriction of the thecal sac  due to these processes in combination with abundant epidural fat.  Severe bilateral foraminal stenosis could compress either or both  exiting L5 nerves.  2. L4-5: Moderate multifactorial spinal stenosis.  3. T12-L1: Right foraminal encroachment by osteophyte could possibly  affect the right T12 nerve.    Aortic Atherosclerosis (ICD10-I70.0).      Electronically Signed    By: Paulina Fusi M.D.    On: 06/29/2020 12:39  PMFS History: Patient Active Problem List   Diagnosis Date Noted   History of CAD (coronary artery disease) 03/31/2023   Hyperlipidemia 03/31/2023   Peripheral neuropathy 03/31/2023   Thrombocytopenia ([name]) 03/31/2023   Hyponatremia 03/31/2023   Metabolic acidosis 03/31/2023   Cellulitis of right leg 03/31/2023   Sepsis due to cellulitis ([name]) 03/30/2023   Diabetic neuropathy ([name]) 10/31/2021   Pain due to onychomycosis of toenails of both feet 01/21/2021   Phimosis of penis 10/06/2020   Cellulitis 09/29/2020   Cellulitis of right toe 09/29/2020   Chronic systolic CHF (congestive heart failure) reduced EF 35% ([name]) 09/15/2020   Onychomycosis of toenail 08/24/2020   Non-insulin dependent type 2 diabetes mellitus ([name]) 07/23/2020   Type 2 diabetes mellitus with hyperglycemia, without long-term current use of insulin ([name]) 07/23/2020   Weight gain 07/23/2020   Type 2 diabetes mellitus with peripheral neuropathy ([name]) 07/23/2020   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 diabetes mellitus ([name]) 05/04/2020   Hypertensive heart disease with heart failure ([name]) 04/26/2020   Type 2 diabetes mellitus with diabetic peripheral angiopathy without gangrene, without long-term current use of insulin ([name]) 04/26/2020   OSA (obstructive sleep apnea) 04/26/2020   PVD (peripheral vascular disease) ([name]) 04/26/2020   CAD in native artery 04/26/2020   Class 2 severe obesity due to excess calories with serious comorbidity and body mass  index (BMI) of 38.0 to 38.9 in adult [facility]) 04/26/2020   Impingement syndrome of right shoulder    Nontraumatic complete tear of right rotator cuff    Type 2 diabetes mellitus with foot ulcer, without long-term current use of insulin ([name])    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   S/P debridement 07/19/2016   Acute gout of left elbow 07/19/2016   Past Medical History:  Diagnosis Date   5 years ago    AICD (automatic cardioverter/defibrillator) present    Anxiety    Arthritis    Cataract    CHF (congestive heart failure) ([name])    Coronary artery disease    Diabetes mellitus without complication ([name])    Diabetic retinopathy ([name])    Dysrhythmia  GERD (gastroesophageal reflux disease)    PMH   Hypertension    Hypertensive retinopathy    Peripheral vascular disease ([name])    Peripheral vascular disease ([name])    Pneumonia    Shoulder impingement, right    Sleep apnea    wears cpap   Stomach ulcer    Wears glasses     Family History  Problem Relation Age of Onset   Heart disease Mother    Diabetes Mother    Peripheral vascular disease Mother     Past Surgical History:  Procedure Laterality Date   AMPUTATION Left 03/12/2019   Procedure: 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;  Surgeon: [name], MD;  Location[facility];  Service: Orthopedics;  Laterality: Left;   AMPUTATION Right 10/01/2020   Procedure: RIGHT 2nd TOE AMPUTATION;  Surgeon: [name], DPM;  Location: [facility] OR;  Service: Podiatry;  Laterality: Right;   CARDIAC CATHETERIZATION     CARDIAC DEFIBRILLATOR PLACEMENT     CIRCUMCISION N/A 11/05/2020   Procedure: CIRCUMCISION ADULT;  Surgeon: [name], MD;  Location: [facility] OR[facility];  Service: Urology;  Laterality: N/A;   I & D EXTREMITY Left 07/19/2016   Procedure: IRRIGATION AND DEBRIDEMENT EXTREMITY/OLECRANON(WASHOUT);  Surgeon: Tarry Kos, MD;  Location: [facility] OR;  Service: Orthopedics;  Laterality: Left;   OLECRANON BURSECTOMY Left 07/19/2016   Procedure:  LEFT ELBOW OLECRANON BURSECTOMY;  Surgeon: Tarry Kos, MD;  Location: [facility];  Service: Orthopedics;  Laterality: Left;   SHOULDER ARTHROSCOPY Right 05/16/2019   Procedure: Right Shoulder Arthroscopy;  Surgeon: [name], MD;  Location: [facility] OR;  Service: Orthopedics;  Laterality: Right;   Social History   Occupational History   Not on file  Tobacco Use   Smoking status: Former    Current packs/day: 0.00    Average packs/day: 1 pack/day for 40.0 years (40.0 ttl pk-yrs)    Types: Cigarettes    Start date: 03/11/1979    Quit date: 03/11/2019    Years since quitting: 4.0   Smokeless tobacco: Never  Vaping Use   Vaping status: Never Used  Substance and Sexual Activity   Alcohol use: Not Currently   Drug use: Not Currently    Types: Oxycodone   Sexual activity: Not on file

## 2023-04-23 ENCOUNTER — Ambulatory Visit: Payer: 59 | Admitting: Orthopedic Surgery

## 2023-04-26 NOTE — Telephone Encounter (Signed)
Acknowledged, matter resolve per Brandie R.

## 2023-05-01 DIAGNOSIS — G4733 Obstructive sleep apnea (adult) (pediatric): Secondary | ICD-10-CM | POA: Diagnosis not present

## 2023-05-08 ENCOUNTER — Ambulatory Visit (INDEPENDENT_AMBULATORY_CARE_PROVIDER_SITE_OTHER): Payer: 59

## 2023-05-08 ENCOUNTER — Ambulatory Visit (INDEPENDENT_AMBULATORY_CARE_PROVIDER_SITE_OTHER): Payer: 59 | Admitting: Podiatry

## 2023-05-08 ENCOUNTER — Telehealth: Payer: Self-pay | Admitting: Podiatry

## 2023-05-08 ENCOUNTER — Encounter: Payer: Self-pay | Admitting: Podiatry

## 2023-05-08 DIAGNOSIS — M778 Other enthesopathies, not elsewhere classified: Secondary | ICD-10-CM | POA: Diagnosis not present

## 2023-05-08 DIAGNOSIS — M86171 Other acute osteomyelitis, right ankle and foot: Secondary | ICD-10-CM

## 2023-05-08 DIAGNOSIS — L97511 Non-pressure chronic ulcer of other part of right foot limited to breakdown of skin: Secondary | ICD-10-CM

## 2023-05-08 MED ORDER — MUPIROCIN 2 % EX OINT: 1.0000 | TOPICAL_OINTMENT | Freq: Two times a day (BID) | CUTANEOUS | 2 refills | Status: AC

## 2023-05-08 MED ORDER — DOXYCYCLINE HYCLATE 100 MG PO TABS: 100.0000 mg | ORAL_TABLET | Freq: Two times a day (BID) | ORAL | 0 refills | Status: DC

## 2023-05-08 NOTE — Patient Instructions (Signed)
 Monitor for any signs/symptoms of infection. Call the office immediately if any occur or go directly to the emergency room. Call with any questions/concerns.

## 2023-05-08 NOTE — Telephone Encounter (Signed)
 Called and let patient know that we will have to change appointment to another day because Dr. Ardelle Anton is at his max for that day that was scheduled. His appointment is now scheduled for March 21 at 8:15 am.

## 2023-05-08 NOTE — Progress Notes (Unsigned)
 Subjective: Chief Complaint  Patient presents with   Toe Pain    RM#11 Patient presents with pinky toe pain in right foot ongoing for 8 months or more very painful.Having to wear sandals so nothing is touching his pinky.   68 year old male presents the office with concerns of a wound and possible infection of his right fifth toe which been ongoing now for many months.  He has been under the care of previously of Dr. Allena Katz in our office but also Dr. Lajoyce Corners.  Patient does not recall any recent injuries to his foot. He does not endorse any fever or chills.  No recent injuries.  Objective: AAO x3, NAD DP/PT pulses palpable bilaterally, CRT less than 3 seconds Right foot: Ulceration identified to the right fifth toe as pictured below.  There is an area of scab, dried blood there is also central area small pinpoint granular wound present.  There is no probing to bone, and time.  There is edema in the posterior muscular fifth toe without any ascending cellulitis. No pain with calf compression, swelling, warmth, erythema    Assessment: Ulceration, chronic right foot  Plan: -All treatment options discussed with the patient including all alternatives, risks, complications.  -X-rays were again reviewed.  3 views of the foot were obtained.  No definitive cortical changes consistent with osteomyelitis at this time.  No soft tissue emphysema. -I did clean the wound.  Recommend daily dressing changes with mupirocin which I prescribed today. -Doxycycline -Surgical shoe for offloading help facilitate wound healing. -Patient encouraged to call the office with any questions, concerns, change in symptoms.   Return in about 2 weeks (around 05/22/2023).

## 2023-05-16 ENCOUNTER — Ambulatory Visit: Payer: 59 | Admitting: Podiatry

## 2023-05-21 NOTE — Progress Notes (Signed)
 Cardiology Office Note:    Date:  05/27/2023   ID:  Jon Jones, DOB 05/13/1955, MRN [id number]  PCP:  Kathreen Cornfield, PA-C   [facility] Providers Cardiologist:  Thurmon Fair, MDSchumann Click to update primary MD,subspecialty MD or APP then REFRESH:1}    Referring MD: No ref. provider found   Chief Complaint  Patient presents with   Coronary Artery Disease   ICD check    History of Present Illness:    Jon Jones is a 68 y.o. male with a hx of severe ischemic cardiomyopathy with LVEF 35% following delayed presentation with an extensive anterior wall myocardial infarction in 2016 (previous inferior myocardial infarction in 2015), s/p dual-chamber ICD (2018, [facility]: Medtronic Evera DR, MRI conditional).  He is seeing Dr. [name] and sees Dr. [name] in sleep clinic.  Additional medical problems include obesity, type 2 diabetes mellitus, essential hypertension, hyperlipidemia, OSA on CPAP, PAD.  Admitted for sepsis from lower extremity cellulitis in January 2025, has recovered well.  He has had previous toe amputations.  He does not have large vessel obstruction but small vessel diabetes related disease.  The patient specifically denies any chest pain at rest or with exertion, dyspnea at rest or with exertion, orthopnea, paroxysmal nocturnal dyspnea, syncope, palpitations, focal neurological deficits, intermittent claudication, lower extremity edema, unexplained weight gain, cough, hemoptysis or wheezing.  His previous angina was described as heaviness in left side of his chest radiating to the left arm.  Metabolic control has deteriorated again and his hemoglobin A1c has increased from 7.5% last year to 8.5%.  His LDL cholesterol is excellent at 63, he has borderline triglycerides 144 and chronically low HDL 37(labs from June 2024).  His PCP is with Kathreen Cornfield, [facility] [phone]).   He is on guideline directed therapy for heart failure with beta-blocker,  Entresto maximum tolerated dose, SGLT2 inhibitor, but is not on spironolactone.  He is also receiving Ozempic, but his glucose control regimen also includes glipizide.  The ICD function is normal.  Estimated generator longevity is 3.3 years.  All lead parameters are checked and are within normal range.  He only has 0.5% atrial pacing and never requires ventricular pacing.  Activity is stable at about 1.2 hours/day and his heart rate histogram distribution is appropriate.  He has not had OptiVol/thoracic impedance go outside of normal range since March 2024.  In the last 12 months he is only had 2 episodes of nonsustained VT, the most recent one was 12 beats long (2.5 seconds, gradually accelerating to 211 bpm then stopping spontaneously).  He has not received any tachycardia therapies.     Past Medical History:  Diagnosis Date   5 years ago    AICD (automatic cardioverter/defibrillator) present    Anxiety    Arthritis    Cataract    CHF (congestive heart failure) ([name])    Coronary artery disease    Diabetes mellitus without complication ([name])    Diabetic retinopathy ([name])    Dysrhythmia    GERD (gastroesophageal reflux disease)    PMH   Hypertension    Hypertensive retinopathy    Peripheral vascular disease ([name])    Peripheral vascular disease ([name])    Pneumonia    Shoulder impingement, right    Sleep apnea    wears cpap   Stomach ulcer    Wears glasses     Past Surgical History:  Procedure Laterality Date   AMPUTATION Left 03/12/2019   Procedure: 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;  Surgeon: Lajoyce Corners,  Randa Evens, MD;  Location: [facility] OR;  Service: Orthopedics;  Laterality: Left;   AMPUTATION Right 10/01/2020   Procedure: RIGHT 2nd TOE AMPUTATION;  Surgeon: [name], DPM;  Location: [facility] OR;  Service: Podiatry;  Laterality: Right;   CARDIAC CATHETERIZATION     CARDIAC DEFIBRILLATOR PLACEMENT     CIRCUMCISION N/A 11/05/2020   Procedure: CIRCUMCISION ADULT;  Surgeon: [name], MD;  Location:  [facility];  Service: Urology;  Laterality: N/A;   I & D EXTREMITY Left 07/19/2016   Procedure: IRRIGATION AND DEBRIDEMENT EXTREMITY/OLECRANON(WASHOUT);  Surgeon: Tarry Kos, MD;  Location: [facility];  Service: Orthopedics;  Laterality: Left;   OLECRANON BURSECTOMY Left 07/19/2016   Procedure: LEFT ELBOW OLECRANON BURSECTOMY;  Surgeon: Tarry Kos, MD;  Location: [facility];  Service: Orthopedics;  Laterality: Left;   SHOULDER ARTHROSCOPY Right 05/16/2019   Procedure: Right Shoulder Arthroscopy;  Surgeon: [name], MD;  Location: [facility] OR;  Service: Orthopedics;  Laterality: Right;    Current Medications: Current Meds  Medication Sig   ACCU-CHEK AVIVA PLUS test strip TEST 3 TIMES DAILY   Accu-Chek Softclix Lancets lancets CHECK BLOOD SUGAR ONCE DAILY   aspirin 81 MG EC tablet Take 81 mg by mouth daily.   atorvastatin (LIPITOR) 80 MG tablet Take 1 tablet (80 mg total) by mouth every evening.   clopidogrel (PLAVIX) 75 MG tablet TAKE 1 TABLET BY MOUTH EVERY DAY (Patient taking differently: Take 75 mg by mouth daily.)   empagliflozin (JARDIANCE) 25 MG TABS tablet TAKE 1 TABLET(25 MG) BY MOUTH AT BEDTIME (Patient taking differently: Take 25 mg by mouth daily. TAKE 1 TABLET(25 MG) BY MOUTH AT BEDTIME)   gabapentin (NEURONTIN) 300 MG capsule TAKE 1 CAPSULE(300 MG) BY MOUTH AT BEDTIME (Patient taking differently: Take 300 mg by mouth at bedtime.)   glipiZIDE (GLUCOTROL) 10 MG tablet TAKE 2 TABLETS(20 MG) BY MOUTH TWICE DAILY (Patient taking differently: Take 20 mg by mouth in the morning and at bedtime. TAKE 2 TABLETS(20 MG) BY MOUTH TWICE DAILY)   JANUMET XR [phone] MG TB24 Take 1 tablet by mouth daily.   metoprolol tartrate (LOPRESSOR) 100 MG tablet Take 100 mg by mouth 2 (two) times daily.   Misc. Devices MISC Cpap supplies-tubing mask Dx G47.33 and Z99.89   mupirocin ointment (BACTROBAN) 2 % Apply 1 Application topically 2 (two) times daily.   RESTASIS 0.05 % ophthalmic emulsion Place  1 drop into both eyes in the morning and at bedtime.   sacubitril-valsartan (ENTRESTO) 24-26 MG Take 1 tablet by mouth 2 (two) times daily.   Semaglutide,0.25 or 0.5MG /DOS, (OZEMPIC, 0.25 OR 0.5 MG/DOSE,) 2 MG/1.5ML SOPN Inject 0.25 mg into the skin once a week.   spironolactone (ALDACTONE) 25 MG tablet Take 0.5 tablets (12.5 mg total) by mouth every other day.     Allergies:   Patient has no known allergies.   Social History   Socioeconomic History   Marital status: Married    Spouse name: Not on file   Number of children: Not on file   Years of education: Not on file   Highest education level: Not on file  Occupational History   Not on file  Tobacco Use   Smoking status: Former    Current packs/day: 0.00    Average packs/day: 1 pack/day for 40.0 years (40.0 ttl pk-yrs)    Types: Cigarettes    Start date: 03/11/1979    Quit date: 03/11/2019    Years since quitting: 4.2  Smokeless tobacco: Never  Vaping Use   Vaping status: Never Used  Substance and Sexual Activity   Alcohol use: Not Currently   Drug use: Not Currently    Types: Oxycodone   Sexual activity: Not on file  Other Topics Concern   Not on file  Social History Narrative   Not on file   Social Drivers of Health   Financial Resource Strain: Not on file  Food Insecurity: No Food Insecurity (03/31/2023)   Hunger Vital Sign    Worried About Running Out of Food in the Last Year: Never true    Ran Out of Food in the Last Year: Never true  Transportation Needs: No Transportation Needs (03/31/2023)   PRAPARE - Administrator, Civil Service (Medical): No    Lack of Transportation (Non-Medical): No  Physical Activity: Not on file  Stress: Not on file  Social Connections: Moderately Isolated (03/31/2023)   Social Connection and Isolation Panel [NHANES]    Frequency of Communication with Friends and Family: More than three times a week    Frequency of Social Gatherings with Friends and Family: More than  three times a week    Attends Religious Services: Never    Database administrator or Organizations: No    Attends Engineer, structural: Never    Marital Status: Married     Family History: The patient's family history includes Diabetes in his mother; Heart disease in his mother; Peripheral vascular disease in his mother.  ROS:   Please see the history of present illness.     All other systems reviewed and are negative.  EKGs/Labs/Other Studies Reviewed:    The following studies were reviewed today: Comprehensive ICD interrogation in the office today, see data above  Normal ABI/TBI 09/08/2022  EKG:  EKG 04/03/2023 personally reviewed shows sinus tachycardia with frequent PVCs, sequelae of old anterior and old inferior infarctions  EKG Interpretation Date/Time:    Ventricular Rate:    PR Interval:    QRS Duration:    QT Interval:    QTC Calculation:   R Axis:      Text Interpretation:          Recent Labs: 03/31/2023: ALT 20 04/01/2023: Magnesium 2.3 04/02/2023: BUN 22; Creatinine, Ser 1.05; Hemoglobin 13.1; Platelets 142; Potassium 4.2; Sodium 137  Recent Lipid Panel    Component Value Date/Time   CHOL 128 01/19/2021 0946   TRIG 199 (H) 01/19/2021 0946   HDL 33 (L) 01/19/2021 0946   CHOLHDL 3.9 01/19/2021 0946   LDLCALC 62 01/19/2021 0946   08/23/2022 cholesterol 123, HDL 37, triglycerides 144, LDL 63 03/31/2023 hemoglobin A1c 8.5%  Risk Assessment/Calculations:       Physical Exam:    VS:  BP 110/60 (BP Location: Left Arm, Patient Position: Sitting)   Pulse 88   Ht 5\' 6"  (1.676 m)   Wt 237 lb (107.5 kg)   SpO2 94%   BMI 38.25 kg/m     Wt Readings from Last 3 Encounters:  05/22/23 237 lb (107.5 kg)  03/30/23 230 lb (104.3 kg)  03/21/23 240 lb 6.4 oz (109 kg)      General: Alert, oriented x3, no distress, healthy left subclavian ICD site Head: no evidence of trauma, PERRL, EOMI, no exophtalmos or lid lag, no myxedema, no xanthelasma;  normal ears, nose and oropharynx Neck: normal jugular venous pulsations and no hepatojugular reflux; brisk carotid pulses without delay and no carotid bruits Chest: clear to auscultation, no signs  of consolidation by percussion or palpation, normal fremitus, symmetrical and full respiratory excursions Cardiovascular: normal position and quality of the apical impulse, regular rhythm, normal first and second heart sounds, no murmurs, rubs or gallops Abdomen: no tenderness or distention, no masses by palpation, no abnormal pulsatility or arterial bruits, normal bowel sounds, no hepatosplenomegaly Extremities: No edema, 2+ pulses in the radials and dorsalis pedis bilaterally but cannot locate the posterior tibials on either side.  Amputation left fifth toe. Neurological: grossly nonfocal Psych: Normal mood and affect     ASSESSMENT:    1. Chronic combined systolic and diastolic heart failure ([name])   2. Coronary artery disease involving native coronary artery of native heart without angina pectoris   3. ICD (implantable cardioverter-defibrillator) in place   4. Hyperlipidemia with target LDL less than 70   5. Type 2 diabetes mellitus with diabetic peripheral angiopathy without gangrene, without long-term current use of insulin ([name])     PLAN:    In order of problems listed above:  CHF: NYHA functional class I-2.  Clinically euvolemic without loop diuretics.  On Entresto, beta-blocker, SGLT2 inhibitor with no longer on spironolactone.  Not sure why spironolactone was stopped.  Looking back through available labs I do not see problems with hyperkalemia or worsening kidney function.  It is possible that his blood pressure was trending too low. CAD: He has not had angina pectoris in many years..  Previous angina was left chest heaviness radiating to the left arm.  History of both inferior and anterior infarctions in the past.  Continue aspirin, statin, beta-blocker ICD: Implanted for primary  prevention and so far without need for therapy.  Normal device function after comprehensive check in the office today.  Continue remote downloads every 3 months. HLP: Chronically low HDL, acceptable LDL on labs performed last June.  Labs afforded by PCP.  Continue statin. DM: Glycemic control has deteriorated again.  Continue Ozempic and Jardiance.  I am not sure why his metformin was discontinued.  Ideally would get him off the glipizide and any other insulin secretagogues.     Medication Adjustments/Labs and Tests Ordered: Current medicines are reviewed at length with the patient today.  Concerns regarding medicines are outlined above.  Orders Placed This Encounter  Procedures   Pacemaker external - parameters   No orders of the defined types were placed in this encounter.   Patient Instructions  Medication Instructions:  Your physician recommends that you continue on your current medications as directed. Please refer to the Current Medication list given to you today.  *If you need a refill on your cardiac medications before your next appointment, please call your pharmacy*   Follow-Up: At [facility], you and your health needs are our priority.  As part of our continuing mission to provide you with exceptional heart care, we have created designated Provider Care Teams.  These Care Teams include your primary Cardiologist (physician) and Advanced Practice Providers (APPs -  Physician Assistants and Nurse Practitioners) who all work together to provide you with the care you need, when you need it.   Your next appointment:   1 year(s)  Provider:   Thurmon Fair, MD     Other instructions: Lab records have been requested for your PCP.     Signed, Thurmon Fair, MD  05/27/2023 1:01 PM    [facility]

## 2023-05-22 ENCOUNTER — Ambulatory Visit: Payer: 59 | Attending: Cardiovascular Disease | Admitting: Cardiovascular Disease

## 2023-05-22 ENCOUNTER — Ambulatory Visit: Payer: 59 | Admitting: Cardiovascular Disease

## 2023-05-22 ENCOUNTER — Encounter: Payer: Self-pay | Admitting: Cardiovascular Disease

## 2023-05-22 VITALS — BP 110/60 | HR 88 | Ht 66.0 in | Wt 237.0 lb

## 2023-05-22 DIAGNOSIS — E785 Hyperlipidemia, unspecified: Secondary | ICD-10-CM | POA: Diagnosis not present

## 2023-05-22 DIAGNOSIS — Z95810 Presence of automatic (implantable) cardiac defibrillator: Secondary | ICD-10-CM | POA: Diagnosis not present

## 2023-05-22 DIAGNOSIS — I2510 Atherosclerotic heart disease of native coronary artery without angina pectoris: Secondary | ICD-10-CM

## 2023-05-22 DIAGNOSIS — I5042 Chronic combined systolic (congestive) and diastolic (congestive) heart failure: Secondary | ICD-10-CM | POA: Diagnosis not present

## 2023-05-22 DIAGNOSIS — E1151 Type 2 diabetes mellitus with diabetic peripheral angiopathy without gangrene: Secondary | ICD-10-CM

## 2023-05-22 NOTE — Patient Instructions (Signed)
 Medication Instructions:  Your physician recommends that you continue on your current medications as directed. Please refer to the Current Medication list given to you today.  *If you need a refill on your cardiac medications before your next appointment, please call your pharmacy*   Follow-Up: At [facility], you and your health needs are our priority.  As part of our continuing mission to provide you with exceptional heart care, we have created designated Provider Care Teams.  These Care Teams include your primary Cardiologist (physician) and Advanced Practice Providers (APPs -  Physician Assistants and Nurse Practitioners) who all work together to provide you with the care you need, when you need it.   Your next appointment:   1 year(s)  Provider:   Thurmon Fair, MD     Other instructions: Lab records have been requested for your PCP.

## 2023-05-27 ENCOUNTER — Encounter: Payer: Self-pay | Admitting: Cardiovascular Disease

## 2023-05-29 ENCOUNTER — Ambulatory Visit: Payer: 59 | Admitting: Podiatry

## 2023-05-30 ENCOUNTER — Ambulatory Visit (HOSPITAL_COMMUNITY)
Admission: RE | Admit: 2023-05-30 | Discharge: 2023-05-30 | Disposition: A | Discharge status: Home / Self Care | Source: Ambulatory Visit | Attending: Podiatry | Admitting: Podiatry

## 2023-05-30 DIAGNOSIS — L97512 Non-pressure chronic ulcer of other part of right foot with fat layer exposed: Secondary | ICD-10-CM | POA: Insufficient documentation

## 2023-05-30 DIAGNOSIS — M19071 Primary osteoarthritis, right ankle and foot: Secondary | ICD-10-CM | POA: Diagnosis not present

## 2023-05-30 DIAGNOSIS — M948X7 Other specified disorders of cartilage, ankle and foot: Secondary | ICD-10-CM | POA: Diagnosis not present

## 2023-05-30 DIAGNOSIS — M869 Osteomyelitis, unspecified: Secondary | ICD-10-CM | POA: Diagnosis not present

## 2023-05-30 DIAGNOSIS — R600 Localized edema: Secondary | ICD-10-CM | POA: Diagnosis not present

## 2023-06-01 ENCOUNTER — Ambulatory Visit: Payer: 59 | Admitting: Podiatry

## 2023-06-05 ENCOUNTER — Ambulatory Visit (INDEPENDENT_AMBULATORY_CARE_PROVIDER_SITE_OTHER): Admitting: Podiatry

## 2023-06-05 ENCOUNTER — Encounter

## 2023-06-05 DIAGNOSIS — L97511 Non-pressure chronic ulcer of other part of right foot limited to breakdown of skin: Secondary | ICD-10-CM | POA: Diagnosis not present

## 2023-06-06 NOTE — Progress Notes (Signed)
  Subjective:  Patient ID: Jon Jones, male    DOB: March 07, 1956,  MRN: [id number]  Chief Complaint  Patient presents with   Foot Ulcer    RM#13 mri results on right foot pinky toe ulcer not healing.    Discussed the use of AI scribe software for clinical note transcription with the patient, who gave verbal consent to proceed.  History of Present Illness The patient presents for follow-up of a foot wound. He recently had an MRI, which showed no evidence of bone infection. The patient is concerned about why the wound is not healing if the bone is not damaged. He has been applying an ointment to the wound, which has resulted in some improvement. The patient has been advised to use compression socks to improve circulation, and he plans to obtain these from another doctor's office (Dr. Lajoyce Corners). He has been keeping the wound uncovered at night to allow it to dry out, but is considering using a dry bandage for padding.      Objective:    Physical Exam General: AAO x3, NAD  Dermatological: Ulceration still present on the 5th toe measuring 0.4 x 0.4 x 0.1 cm. There is still some edema localized to the 5th toe but no ascending cellulitis. It looks more dry today. No drainage or pus. No fluctuation, crepitation or malodor. No new lesions present.   Vascular: Dorsalis Pedis artery and Posterior Tibial artery pedal pulses are palpable bilateral with immedate capillary fill time.  There is no pain with calf compression, swelling, warmth, erythema.   Neruologic: Sensation decreased.   Musculoskeletal: Digital contractures present.     No images are attached to the encounter.    Results    Assessment:   1. Ulcer of great toe, right, limited to breakdown of skin [facility])      Plan:  Patient was evaluated and treated and all questions answered.  Assessment and Plan Assessment & Plan Foot wound with cellulitis - Today I sharply debrided the wound with a #312 blade scalpel to debride  nonviable, devitalized tissue. There is no blood loss. I cleaned the area with saline. Antibiotic ointment was applied followed by a dressing. Tolerated well.   - Avoid pressure on wound and keep elevated. Continue open toe shoe for now.  - Vascular: ABI 09/08/2022- WNL - MRI: No evidence of acute osteomyelitis. Reviewed findings with patient.  - Contact Dr. Audrie Lia office regarding compression socks as he was asking about these and was told previously it would help.  - Schedule follow-up in three weeks. - Monitor wound daily for changes or signs of infection and report immediately.    Return in about 3 weeks (around 06/26/2023) for toe ulcer right 5th.

## 2023-06-12 DIAGNOSIS — G4733 Obstructive sleep apnea (adult) (pediatric): Secondary | ICD-10-CM | POA: Diagnosis not present

## 2023-06-28 ENCOUNTER — Ambulatory Visit: Admitting: Podiatry

## 2023-07-13 ENCOUNTER — Ambulatory Visit (INDEPENDENT_AMBULATORY_CARE_PROVIDER_SITE_OTHER): Payer: 59 | Admitting: Family

## 2023-07-13 ENCOUNTER — Encounter: Payer: Self-pay | Admitting: Family

## 2023-07-13 DIAGNOSIS — M75121 Complete rotator cuff tear or rupture of right shoulder, not specified as traumatic: Secondary | ICD-10-CM

## 2023-07-13 DIAGNOSIS — M170 Bilateral primary osteoarthritis of knee: Secondary | ICD-10-CM | POA: Diagnosis not present

## 2023-07-13 DIAGNOSIS — M25511 Pain in right shoulder: Secondary | ICD-10-CM

## 2023-07-13 DIAGNOSIS — G8929 Other chronic pain: Secondary | ICD-10-CM

## 2023-07-13 MED ORDER — METHYLPREDNISOLONE ACETATE 40 MG/ML IJ SUSP
40.0000 mg | INTRAMUSCULAR | Status: AC | PRN
  Administered 2023-07-13: 40 mg via INTRA_ARTICULAR

## 2023-07-13 MED ORDER — LIDOCAINE HCL 1 % IJ SOLN
5.0000 mL | INTRAMUSCULAR | Status: AC | PRN
  Administered 2023-07-13: 5 mL

## 2023-07-13 MED ORDER — LIDOCAINE HCL 1 % IJ SOLN
5.0000 mL | INTRAMUSCULAR | Status: AC | PRN
Start: 2023-07-13 — End: 2023-07-13
  Administered 2023-07-13: 5 mL

## 2023-07-13 NOTE — Progress Notes (Signed)
 Office Visit Note   [name]           Date of Birth: [birth year]           MRN: [id number] Visit Date: 07/13/2023              Requested by: No referring provider defined for this encounter. PCP: Isabelle Maple, PA-C  Chief Complaint  Patient presents with   Right Knee - Follow-up   Left Knee - Follow-up   Right Shoulder - Follow-up      HPI: The patient is a 68 year old gentleman seen today in routine follow-up for bilateral knee osteoarthritis as well as chronic right shoulder pain.  No changes  Has been satisfied with his current treatment with Depo-Medrol  injection for interval relief of pain.   Last injection was April of this year for knees. Assessment & Plan: Visit Diagnoses: No diagnosis found.  Plan: Depo-Medrol  injection bilateral knees and right shoulder.  Patient tolerated well.  Follow-up as needed.  Follow-Up Instructions: Return in about 3 months (around 10/13/2023).   Ortho Exam  Patient is alert, oriented, no adenopathy, well-dressed, normal affect, normal respiratory effort. On examination of the right shoulder patient has abduction and flexion about 90 degrees.  Crepitation with range of motion.  He has good internal and external rotation with passive range of motion to 120 degrees.  No adhesive capsulitis.   Bilateral knees without edema or erythema there is medial joint line tenderness.  Collaterals and cruciates are stable.  Imaging: No results found. No images are attached to the encounter.  Labs: Lab Results  Component Value Date   HGBA1C 8.5 (H) 03/31/2023   HGBA1C 8.7 (H) 10/19/2020   HGBA1C 9.8 (H) 09/08/2020   ESRSEDRATE 14 03/30/2023   ESRSEDRATE 11 03/11/2019   CRP 13.4 (H) 03/30/2023   CRP 1.8 (H) 03/11/2019   REPTSTATUS 04/04/2023 FINAL 03/30/2023   GRAMSTAIN  07/19/2016    RARE WBC PRESENT, PREDOMINANTLY MONONUCLEAR NO ORGANISMS SEEN    CULT  03/30/2023    NO GROWTH 5 DAYS Performed at [facility],  [address] [place], [place] [address]      Lab Results  Component Value Date   ALBUMIN  3.3 (L) 03/31/2023   ALBUMIN  3.7 03/30/2023   ALBUMIN  3.5 09/29/2020    Lab Results  Component Value Date   MG 2.3 04/01/2023   MG 2.1 03/31/2023   No results found for: "VD25OH"  No results found for: "PREALBUMIN"    Latest Ref Rng & Units 04/02/2023    3:07 AM 04/01/2023    3:52 AM 03/31/2023    4:39 AM  CBC EXTENDED  WBC 4.0 - 10.5 K/uL 5.9  6.5  9.5   RBC 4.22 - 5.81 MIL/uL 4.27  4.20  4.18   Hemoglobin 13.0 - 17.0 g/dL 53.6  64.4  03.4   HCT 39.0 - 52.0 % 41.6  40.9  40.2   Platelets 150 - 400 K/uL 142  128  129      There is no height or weight on file to calculate BMI.  Orders:  No orders of the defined types were placed in this encounter.  No orders of the defined types were placed in this encounter.    Procedures: Large Joint Inj: bilateral knee on 07/13/2023 10:57 AM Indications: pain Details: 18 G 1.5 in needle, anteromedial approach Medications (Right): 5 mL lidocaine  1 %; 40 mg methylPREDNISolone  acetate 40 MG/ML Medications (Left): 5 mL lidocaine  1 %;  40 mg methylPREDNISolone  acetate 40 MG/ML Consent was given by the patient.    Large Joint Inj: R subacromial bursa on 07/13/2023 10:57 AM Indications: diagnostic evaluation and pain Details: 22 G 1.5 in needle  Arthrogram: No  Medications: 5 mL lidocaine  1 %; 40 mg methylPREDNISolone  acetate 40 MG/ML Outcome: tolerated well, no immediate complications Procedure, treatment alternatives, risks and benefits explained, specific risks discussed. Consent was given by the patient. Immediately prior to procedure a time out was called to verify the correct patient, procedure, equipment, support staff and site/side marked as required. Patient was prepped and draped in the usual sterile fashion.      Clinical Data: No additional findings.  ROS:  All other systems negative, except as noted in the HPI. Review of  Systems  Objective: Vital Signs: There were no vitals taken for this visit.  Specialty Comments:  Ct Myelogram  IMPRESSION:  1. L5-S1: Severe multifactorial spinal stenosis. Advanced bilateral  facet arthropathy with 5 mm of anterolisthesis which increases to 8  mm with standing and flexion. Bulging of the disc. 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 Marked constriction of the thecal sac  due to these processes in combination with abundant epidural fat.  Severe bilateral foraminal stenosis could compress either or both  exiting L5 nerves.  2. L4-5: Moderate multifactorial spinal stenosis.  3. T12-L1: Right foraminal encroachment by osteophyte could possibly  affect the right T12 nerve.    Aortic Atherosclerosis (ICD10-I70.0).      Electronically Signed    By: Bettylou Brunner M.D.    On: 06/29/2020 12:39  PMFS History: Patient Active Problem List   Diagnosis Date Noted   History of CAD (coronary artery disease) 03/31/2023   Hyperlipidemia 03/31/2023   Peripheral neuropathy 03/31/2023   Thrombocytopenia ([name]) 03/31/2023   Hyponatremia 03/31/2023   Metabolic acidosis 03/31/2023   Cellulitis of right leg 03/31/2023   Sepsis due to cellulitis ([name]) 03/30/2023   Diabetic neuropathy ([name]) 10/31/2021   Pain due to onychomycosis of toenails of both feet 01/21/2021   Phimosis of penis 10/06/2020   Cellulitis 09/29/2020   Cellulitis of right toe 09/29/2020   Chronic systolic CHF (congestive heart failure) reduced EF 35% ([name]) 09/15/2020   Onychomycosis of toenail 08/24/2020   Non-insulin  dependent type 2 diabetes mellitus ([name]) 07/23/2020   Type 2 diabetes mellitus with hyperglycemia, without long-term current use of insulin  ([name]) 07/23/2020   Weight gain 07/23/2020   Type 2 diabetes mellitus with peripheral neuropathy ([name]) 07/23/2020   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 diabetes mellitus ([name]) 05/04/2020    Hypertensive heart disease with heart failure ([name]) 04/26/2020   Type 2 diabetes mellitus with diabetic peripheral angiopathy without gangrene, without long-term current use of insulin  ([name]) 04/26/2020   OSA (obstructive sleep apnea) 04/26/2020   PVD (peripheral vascular disease) ([name]) 04/26/2020   CAD in native artery 04/26/2020   Class 2 severe obesity due to excess calories with serious comorbidity and body mass index (BMI) of 38.0 to 38.9 in adult [facility]) 04/26/2020   Impingement syndrome of right shoulder    Nontraumatic complete tear of right rotator cuff    Type 2 diabetes mellitus with foot ulcer, without long-term current use of insulin  ([name])    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   S/P debridement 07/19/2016   Acute gout of left elbow 07/19/2016   Past Medical History:  Diagnosis Date  5 years ago    AICD (automatic cardioverter/defibrillator) present    Anxiety    Arthritis    Cataract    CHF (congestive heart failure) ([name])    Coronary artery disease    Diabetes mellitus without complication ([name])    Diabetic retinopathy ([name])    Dysrhythmia    GERD (gastroesophageal reflux disease)    PMH   Hypertension    Hypertensive retinopathy    Peripheral vascular disease ([name])    Peripheral vascular disease ([name])    Pneumonia    Shoulder impingement, right    Sleep apnea    wears cpap   Stomach ulcer    Wears glasses     Family History  Problem Relation Age of Onset   Heart disease Mother    Diabetes Mother    Peripheral vascular disease Mother     Past Surgical History:  Procedure Laterality Date   AMPUTATION Left 03/12/2019   Procedure: 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;  Surgeon: Timothy Ford, MD;  Location: [facility] OR;  Service: Orthopedics;  Laterality: Left;   AMPUTATION Right 10/01/2020   Procedure: RIGHT 2nd TOE AMPUTATION;  Surgeon: Velma Ghazi, DPM;  Location: [facility] OR;  Service: Podiatry;  Laterality: Right;   CARDIAC CATHETERIZATION      CARDIAC DEFIBRILLATOR PLACEMENT     CIRCUMCISION N/A 11/05/2020   Procedure: CIRCUMCISION ADULT;  Surgeon: Homero Luster, MD;  Location: [facility] ORS;  Service: Urology;  Laterality: N/A;   I & D EXTREMITY Left 07/19/2016   Procedure: IRRIGATION AND DEBRIDEMENT EXTREMITY/OLECRANON(WASHOUT);  Surgeon: Wes Hamman, MD;  Location: [facility] OR;  Service: Orthopedics;  Laterality: Left;   OLECRANON BURSECTOMY Left 07/19/2016   Procedure: LEFT ELBOW OLECRANON BURSECTOMY;  Surgeon: Wes Hamman, MD;  Location: [facility];  Service: Orthopedics;  Laterality: Left;   SHOULDER ARTHROSCOPY Right 05/16/2019   Procedure: Right Shoulder Arthroscopy;  Surgeon: Timothy Ford, MD;  Location: [facility] OR;  Service: Orthopedics;  Laterality: Right;   Social History   Occupational History   Not on file  Tobacco Use   Smoking status: Former    Current packs/day: 0.00    Average packs/day: 1 pack/day for 40.0 years (40.0 ttl pk-yrs)    Types: Cigarettes    Start date: 03/11/1979    Quit date: 03/11/2019    Years since quitting: 4.3   Smokeless tobacco: Never  Vaping Use   Vaping status: Never Used  Substance and Sexual Activity   Alcohol use: Not Currently   Drug use: Not Currently    Types: Oxycodone    Sexual activity: Not on file

## 2023-07-30 ENCOUNTER — Telehealth: Payer: Self-pay | Admitting: Cardiovascular Disease

## 2023-07-30 NOTE — Telephone Encounter (Signed)
 What problem are you experiencing? Pt is out of town and his cpap broke. He needs new order for new cpap machine sent to [employer] [facility] Fax# [phone]  Who is your medical equipment company?   3)    If patient is calling about their sleep study results please route to [facility] Sleep Study Pool.   Please route to the sleep study coordinator.

## 2023-08-02 NOTE — Telephone Encounter (Signed)
 NOT ELIGIBLE FOR A NEW MACHINE.  Patient has been advised to call [employer] [facility] and get an appointment to troubleshoot his problem.

## 2023-08-02 NOTE — Telephone Encounter (Signed)
 DME CHANGED TO  [facility]  What problem are you experiencing? His machine Has a error cde #1 Pt is out of town and his cpap broke. He needs new order for new cpap machine sent to [facility] Fax# [phone]   Who is your medical equipment company? [employer] [facility]    Order sent to [employer] [facility] to troubleshoot his cpap. Patient has been given the phone number [phone] to call and speak to an RT and get an appointment to bring his cpap in.

## 2023-08-08 DIAGNOSIS — H259 Unspecified age-related cataract: Secondary | ICD-10-CM | POA: Diagnosis not present

## 2023-08-08 DIAGNOSIS — H60542 Acute eczematoid otitis externa, left ear: Secondary | ICD-10-CM | POA: Diagnosis not present

## 2023-08-08 DIAGNOSIS — E1140 Type 2 diabetes mellitus with diabetic neuropathy, unspecified: Secondary | ICD-10-CM | POA: Diagnosis not present

## 2023-08-08 DIAGNOSIS — E1165 Type 2 diabetes mellitus with hyperglycemia: Secondary | ICD-10-CM | POA: Diagnosis not present

## 2023-08-08 DIAGNOSIS — E1159 Type 2 diabetes mellitus with other circulatory complications: Secondary | ICD-10-CM | POA: Diagnosis not present

## 2023-08-15 ENCOUNTER — Ambulatory Visit: Payer: 59 | Admitting: Podiatry

## 2023-09-04 ENCOUNTER — Ambulatory Visit (INDEPENDENT_AMBULATORY_CARE_PROVIDER_SITE_OTHER): Payer: 59

## 2023-09-04 DIAGNOSIS — I5042 Chronic combined systolic (congestive) and diastolic (congestive) heart failure: Secondary | ICD-10-CM

## 2023-09-05 ENCOUNTER — Ambulatory Visit: Admitting: Podiatry

## 2023-09-05 DIAGNOSIS — G4733 Obstructive sleep apnea (adult) (pediatric): Secondary | ICD-10-CM | POA: Diagnosis not present

## 2023-09-05 LAB — CUP PACEART REMOTE DEVICE CHECK
Battery Remaining Longevity: 37 mo
Battery Voltage: 2.95 V
Brady Statistic AP VP Percent: 0 %
Brady Statistic AP VS Percent: 0.55 %
Brady Statistic AS VP Percent: 0.03 %
Brady Statistic AS VS Percent: 99.41 %
Brady Statistic RA Percent Paced: 0.55 %
Brady Statistic RV Percent Paced: 0.04 %
Date Time Interrogation Session: 20250624091810
HighPow Impedance: 82 Ohm
Implantable Lead Connection Status: 753985
Implantable Lead Connection Status: 753985
Implantable Lead Implant Date: 20180702
Implantable Lead Implant Date: 20180702
Implantable Lead Location: 753859
Implantable Lead Location: 753860
Implantable Lead Model: 5076
Implantable Pulse Generator Implant Date: 20180702
Lead Channel Impedance Value: 380 Ohm
Lead Channel Impedance Value: 437 Ohm
Lead Channel Impedance Value: 513 Ohm
Lead Channel Pacing Threshold Amplitude: 0.5 V
Lead Channel Pacing Threshold Amplitude: 0.5 V
Lead Channel Pacing Threshold Pulse Width: 0.4 ms
Lead Channel Pacing Threshold Pulse Width: 0.4 ms
Lead Channel Sensing Intrinsic Amplitude: 3 mV
Lead Channel Sensing Intrinsic Amplitude: 3 mV
Lead Channel Sensing Intrinsic Amplitude: 5.125 mV
Lead Channel Sensing Intrinsic Amplitude: 5.125 mV
Lead Channel Setting Pacing Amplitude: 1.5 V
Lead Channel Setting Pacing Amplitude: 2 V
Lead Channel Setting Pacing Pulse Width: 0.4 ms
Lead Channel Setting Sensing Sensitivity: 0.3 mV
Zone Setting Status: 755011
Zone Setting Status: 755011

## 2023-09-07 ENCOUNTER — Ambulatory Visit (INDEPENDENT_AMBULATORY_CARE_PROVIDER_SITE_OTHER): Admitting: Podiatry

## 2023-09-07 DIAGNOSIS — M79675 Pain in left toe(s): Secondary | ICD-10-CM

## 2023-09-07 DIAGNOSIS — B351 Tinea unguium: Secondary | ICD-10-CM

## 2023-09-07 DIAGNOSIS — M79674 Pain in right toe(s): Secondary | ICD-10-CM | POA: Diagnosis not present

## 2023-09-07 DIAGNOSIS — E1159 Type 2 diabetes mellitus with other circulatory complications: Secondary | ICD-10-CM | POA: Diagnosis not present

## 2023-09-07 NOTE — Progress Notes (Signed)
 This patient returns to my office for at risk foot care.  This patient requires this care by a professional since this patient will be at risk due to having diabetic neuropathy.This patient is unable to cut nails himself since the patient cannot reach his nails.These nails are painful walking and wearing shoes.  He says he has been dealing with open wound on fifth toe right foot. By Dr.  Nick. This patient presents for at risk foot care today.  General Appearance  Alert, conversant and in no acute stress.  Vascular  Dorsalis pedis and posterior tibial  pulses are palpable  bilaterally.  Capillary return is within normal limits  bilaterally. Temperature is within normal limits  bilaterally.  Neurologic  Senn-Weinstein monofilament wire test diminished  bilaterally. Muscle power within normal limits bilaterally.  Nails Thick disfigured discolored nails with subungual debris  toes bilaterally. No evidence of bacterial infection or drainage bilaterally.  Orthopedic  No limitations of motion  feet .  No crepitus or effusions noted.  No bony pathology or digital deformities noted. 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  Skin  normotropic skin with no porokeratosis noted bilaterally.  No signs of infections or ulcers noted.     Onychomycosis  Pain in right toes  Pain in left toes  Consent was obtained for treatment procedures.   Mechanical debridement of nails 1-5  bilaterally performed with a nail nipper.  Filed with dremel without incident. Dr Tobie came to examine his fifth toe right foot. Dr.  Tobie prescribed silvadene  and doxycycline  for this patient.  DSD applied fifth toe right foot.   Return office visit     3 months.                Told patient to return for periodic foot care and evaluation due to potential at risk complications.   Franky Tobie

## 2023-09-10 ENCOUNTER — Ambulatory Visit: Payer: Self-pay | Admitting: Cardiovascular Disease

## 2023-09-20 ENCOUNTER — Ambulatory Visit: Admitting: Family Medicine

## 2023-09-20 ENCOUNTER — Telehealth: Payer: Self-pay | Admitting: Physician Assistant

## 2023-09-20 NOTE — Telephone Encounter (Signed)
 Called pt and left vm to call office back if pt wants to reschedule missed NP appt at [facility]

## 2023-09-24 ENCOUNTER — Telehealth: Payer: Self-pay | Admitting: Physical Medicine and Rehabilitation

## 2023-09-24 NOTE — Telephone Encounter (Signed)
 Patient was here. Would like an injection

## 2023-10-09 ENCOUNTER — Telehealth: Payer: Self-pay | Admitting: Cardiology

## 2023-10-09 ENCOUNTER — Ambulatory Visit (INDEPENDENT_AMBULATORY_CARE_PROVIDER_SITE_OTHER): Admitting: Physical Medicine and Rehabilitation

## 2023-10-09 ENCOUNTER — Encounter: Payer: Self-pay | Admitting: Physical Medicine and Rehabilitation

## 2023-10-09 DIAGNOSIS — M48062 Spinal stenosis, lumbar region with neurogenic claudication: Secondary | ICD-10-CM | POA: Diagnosis not present

## 2023-10-09 DIAGNOSIS — I2510 Atherosclerotic heart disease of native coronary artery without angina pectoris: Secondary | ICD-10-CM

## 2023-10-09 DIAGNOSIS — M5416 Radiculopathy, lumbar region: Secondary | ICD-10-CM

## 2023-10-09 DIAGNOSIS — M4316 Spondylolisthesis, lumbar region: Secondary | ICD-10-CM | POA: Diagnosis not present

## 2023-10-09 DIAGNOSIS — G4733 Obstructive sleep apnea (adult) (pediatric): Secondary | ICD-10-CM

## 2023-10-09 NOTE — Telephone Encounter (Signed)
 What problem are you experiencing? Needs new C-Pap  Who is your medical equipment company? [employer]  3)    If patient is calling about their sleep study results please route to [facility] Sleep Study Pool.   Please route to the sleep study coordinator.

## 2023-10-09 NOTE — Progress Notes (Unsigned)
 Pain Scale   Average Pain 7 Patient advising he has lower back pain that radiates bilaterally to both hips/legs. Patient advises that his pain starts when it raining.        +Driver, -BT, -Dye Allergies.

## 2023-10-09 NOTE — Progress Notes (Unsigned)
 [name] - 68 y.o. male MRN [id number]  Date of birth: 09/24/1955  Office Visit Note: Visit Date: 10/09/2023 PCP: Linde Bitters, PA-C Referred by: Linde Bitters, PA-C  Subjective: Chief Complaint  Patient presents with   Lower Back - Pain   HPI: Jon Jones is a 68 y.o. male who comes in today for evaluation of chronic, worsening and severe right sided lower back pain radiating to buttock and down lateral leg to foot. Pain ongoing for several years, states humidity and rain makes his pain worse. He describes pain as sore and aching sensation, currently rates as 7 out of 10. Some relief of pain with home exercise regimen, rest and use of medications. No history of formal physical therapy. CT myelogram of lumbar spine from 2022 shows severe multifactorial spinal stenosis at L5-S1. Advanced bilateral facet arthropathy with 5 mm of anterolisthesis which increases to 8 mm with standing and flexion. There is moderate spinal canal stenosis at L4-L5. Patient underwent right L5 transforaminal epidural steroid injection in our office on 08/10/2022. He reports greater than 80% relief of pain with this injection, also reports increased functional ability post injection. Patient denies focal weakness, numbness and tingling. No recent trauma or falls.       Review of Systems  Musculoskeletal:  Positive for back pain.  Neurological:  Negative for tingling, sensory change, focal weakness and weakness.  All other systems reviewed and are negative.  Otherwise per HPI.  Assessment & Plan: Visit Diagnoses:    ICD-10-CM   1. Lumbar radiculopathy  M54.16 Ambulatory referral to Physical Medicine Rehab    Ambulatory referral to Orthopedic Surgery    2. Spinal stenosis of lumbar region with neurogenic claudication  M48.062 Ambulatory referral to Physical Medicine Rehab    Ambulatory referral to Orthopedic Surgery    3. Spondylolisthesis of lumbar region  M43.16 Ambulatory referral to Physical  Medicine Rehab    Ambulatory referral to Orthopedic Surgery       Plan: Findings:  Chronic, worsening and severe right sided lower back pain radiating to buttock and down lateral leg to foot. Patient continues to have severe pain despite good conservative therapies such as home exercise regimen, rest and use of medications. Patients clinical presentation and exam are consistent with neurogenic claudication as a result of spinal canal stenosis. There is severe spinal canal stenosis and dynamic listhesis at L5-S1. Next step is to perform diagnostic and hopefully therapeutic right L5 transforaminal epidural steroid injection under fluoroscopic guidance. If good relief of pain with injection we can repeat this procedure infrequently as needed. He has been fairly adamant about avoiding surgery, states he would like to speak with our spine surgeon to discuss his options. I also placed referral for surgical consult with Dr. Georgina. He has no questions at this time. No red flag symptoms noted upon exam today.     Meds & Orders: No orders of the defined types were placed in this encounter.   Orders Placed This Encounter  Procedures   Ambulatory referral to Physical Medicine Rehab   Ambulatory referral to Orthopedic Surgery    Follow-up: Return for Right L5 transforaminal epidural steroid injection.   Procedures: No procedures performed      Clinical History: Ct Myelogram  IMPRESSION:  1. L5-S1: Severe multifactorial spinal stenosis. Advanced bilateral  facet arthropathy with 5 mm of anterolisthesis which increases to 8  mm with standing and flexion. Bulging of the disc. Complex synovial  cyst arising from the facet joint on the  left within the posterior  left side of the spinal canal. Marked constriction of the thecal sac  due to these processes in combination with abundant epidural fat.  Severe bilateral foraminal stenosis could compress either or both  exiting L5 nerves.  2. L4-5: Moderate  multifactorial spinal stenosis.  3. T12-L1: Right foraminal encroachment by osteophyte could possibly  affect the right T12 nerve.    Aortic Atherosclerosis (ICD10-I70.0).      Electronically Signed    By: Oneil Officer M.D.    On: 06/29/2020 12:39   He reports that he quit smoking about 4 years ago. His smoking use included cigarettes. He started smoking about 44 years ago. He has a 40 pack-year smoking history. He has never used smokeless tobacco.  Recent Labs    03/31/23 0439  HGBA1C 8.5*    Objective:  VS:  HT:    WT:   BMI:     BP:   HR: bpm  TEMP: ( )  RESP:  Physical Exam Vitals and nursing note reviewed.  HENT:     Head: Normocephalic and atraumatic.     Right Ear: External ear normal.     Left Ear: External ear normal.     Nose: Nose normal.     Mouth/Throat:     Mouth: Mucous membranes are moist.  Eyes:     Extraocular Movements: Extraocular movements intact.  Cardiovascular:     Rate and Rhythm: Normal rate.     Pulses: Normal pulses.  Pulmonary:     Effort: Pulmonary effort is normal.  Abdominal:     General: Abdomen is flat. There is no distension.  Musculoskeletal:        General: Tenderness present.     Cervical back: Normal range of motion.     Comments: Patient rises from seated position to standing without difficulty. Mild pain noted with facet loading and lumbar extension. 5/5 strength noted with bilateral hip flexion, knee flexion/extension, ankle dorsiflexion/plantarflexion and EHL. No clonus noted bilaterally. No pain upon palpation of greater trochanters. No pain with internal/external rotation of bilateral hips. Sensation intact bilaterally. Negative slump test bilaterally. Ambulates without aid, gait steady.     Skin:    General: Skin is warm and dry.     Capillary Refill: Capillary refill takes less than 2 seconds.  Neurological:     General: No focal deficit present.     Mental Status: He is alert and oriented to person, place, and time.   Psychiatric:        Mood and Affect: Mood normal.        Behavior: Behavior normal.     Ortho Exam  Imaging: No results found.  Past Medical/Family/Surgical/Social History: Medications & Allergies reviewed per EMR, new medications updated. Patient Active Problem List   Diagnosis Date Noted   History of CAD (coronary artery disease) 03/31/2023   Hyperlipidemia 03/31/2023   Peripheral neuropathy 03/31/2023   Thrombocytopenia ([name]) 03/31/2023   Hyponatremia 03/31/2023   Metabolic acidosis 03/31/2023   Cellulitis of right leg 03/31/2023   Sepsis due to cellulitis ([name]) 03/30/2023   Diabetic neuropathy ([name]) 10/31/2021   Pain due to onychomycosis of toenails of both feet 01/21/2021   Phimosis of penis 10/06/2020   Cellulitis 09/29/2020   Cellulitis of right toe 09/29/2020   Chronic systolic CHF (congestive heart failure) reduced EF 35% ([name]) 09/15/2020   Onychomycosis of toenail 08/24/2020   Non-insulin  dependent type 2 diabetes mellitus ([name]) 07/23/2020   Type 2 diabetes mellitus  with hyperglycemia, without long-term current use of insulin  ([name]) 07/23/2020   Weight gain 07/23/2020   Type 2 diabetes mellitus with peripheral neuropathy ([name]) 07/23/2020   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 diabetes mellitus ([name]) 05/04/2020   Hypertensive heart disease with heart failure ([name]) 04/26/2020   Type 2 diabetes mellitus with diabetic peripheral angiopathy without gangrene, without long-term current use of insulin  ([name]) 04/26/2020   OSA (obstructive sleep apnea) 04/26/2020   PVD (peripheral vascular disease) ([name]) 04/26/2020   CAD in native artery 04/26/2020   Class 2 severe obesity due to excess calories with serious comorbidity and body mass index (BMI) of 38.0 to 38.9 in adult [facility]) 04/26/2020   Impingement syndrome of right shoulder    Nontraumatic complete tear of right rotator cuff    Type 2 diabetes mellitus with foot ulcer, without long-term current use of insulin  ([name])     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   S/P debridement 07/19/2016   Acute gout of left elbow 07/19/2016   Past Medical History:  Diagnosis Date   5 years ago    AICD (automatic cardioverter/defibrillator) present    Anxiety    Arthritis    Cataract    CHF (congestive heart failure) ([name])    Coronary artery disease    Diabetes mellitus without complication ([name])    Diabetic retinopathy ([name])    Dysrhythmia    GERD (gastroesophageal reflux disease)    PMH   Hypertension    Hypertensive retinopathy    Peripheral vascular disease ([name])    Peripheral vascular disease ([name])    Pneumonia    Shoulder impingement, right    Sleep apnea    wears cpap   Stomach ulcer    Wears glasses    Family History  Problem Relation Age of Onset   Heart disease Mother    Diabetes Mother    Peripheral vascular disease Mother    Past Surgical History:  Procedure Laterality Date   AMPUTATION Left 03/12/2019   Procedure: 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;  Surgeon: Harden Jerona GAILS, MD;  Location: [facility] OR;  Service: Orthopedics;  Laterality: Left;   AMPUTATION Right 10/01/2020   Procedure: RIGHT 2nd TOE AMPUTATION;  Surgeon: Tobie Franky SQUIBB, DPM;  Location: [facility] OR;  Service: Podiatry;  Laterality: Right;   CARDIAC CATHETERIZATION     CARDIAC DEFIBRILLATOR PLACEMENT     CIRCUMCISION N/A 11/05/2020   Procedure: CIRCUMCISION ADULT;  Surgeon: Watt Rush, MD;  Location: [facility];  Service: Urology;  Laterality: N/A;   I & D EXTREMITY Left 07/19/2016   Procedure: IRRIGATION AND DEBRIDEMENT EXTREMITY/OLECRANON(WASHOUT);  Surgeon: Jerri Kay HERO, MD;  Location: [facility] OR;  Service: Orthopedics;  Laterality: Left;   OLECRANON BURSECTOMY Left 07/19/2016   Procedure: LEFT ELBOW OLECRANON BURSECTOMY;  Surgeon: Jerri Kay HERO, MD;  Location: [facility];  Service: Orthopedics;  Laterality: Left;   SHOULDER ARTHROSCOPY Right 05/16/2019   Procedure: Right Shoulder Arthroscopy;  Surgeon: Harden Jerona GAILS,  MD;  Location: [facility] OR;  Service: Orthopedics;  Laterality: Right;   Social History   Occupational History   Not on file  Tobacco Use   Smoking status: Former    Current packs/day: 0.00    Average packs/day: 1 pack/day for 40.0 years (40.0 ttl pk-yrs)    Types: Cigarettes    Start date: 03/11/1979    Quit date: 03/11/2019    Years since quitting: 4.5   Smokeless tobacco: Never  Vaping Use   Vaping status:  Never Used  Substance and Sexual Activity   Alcohol use: Not Currently   Drug use: Not Currently    Types: Oxycodone    Sexual activity: Not on file

## 2023-10-10 ENCOUNTER — Ambulatory Visit: Admitting: Internal Medicine

## 2023-10-12 ENCOUNTER — Encounter: Payer: Self-pay | Admitting: Family

## 2023-10-12 ENCOUNTER — Ambulatory Visit: Admitting: Family

## 2023-10-12 DIAGNOSIS — M25511 Pain in right shoulder: Secondary | ICD-10-CM

## 2023-10-12 DIAGNOSIS — M170 Bilateral primary osteoarthritis of knee: Secondary | ICD-10-CM

## 2023-10-12 DIAGNOSIS — G8929 Other chronic pain: Secondary | ICD-10-CM | POA: Diagnosis not present

## 2023-10-12 DIAGNOSIS — G4733 Obstructive sleep apnea (adult) (pediatric): Secondary | ICD-10-CM | POA: Diagnosis not present

## 2023-10-12 MED ORDER — METHYLPREDNISOLONE ACETATE 40 MG/ML IJ SUSP
40.0000 mg | INTRAMUSCULAR | Status: AC | PRN
  Administered 2023-10-12: 40 mg via INTRA_ARTICULAR

## 2023-10-12 MED ORDER — IBUPROFEN 800 MG PO TABS: 800.0000 mg | ORAL_TABLET | Freq: Three times a day (TID) | ORAL | 2 refills | Status: DC | PRN

## 2023-10-12 MED ORDER — LIDOCAINE HCL 1 % IJ SOLN
5.0000 mL | INTRAMUSCULAR | Status: AC | PRN
  Administered 2023-10-12: 5 mL

## 2023-10-12 MED ORDER — LIDOCAINE HCL 1 % IJ SOLN
5.0000 mL | INTRAMUSCULAR | Status: AC | PRN
Start: 2023-10-12 — End: 2023-10-12
  Administered 2023-10-12: 5 mL

## 2023-10-12 NOTE — Progress Notes (Signed)
 Office Visit Note   [name]           Date of Birth: 1955-07-12           MRN: [id number] Visit Date: 10/12/2023              Requested by: Linde Bitters, PA-C [address] [place],  [place] [address] PCP: Toepfer, Olivia, PA-C  No chief complaint on file.     HPI: The patient is a 68 year old gentleman we have been following for bilateral knee osteoarthritis as well as chronic right shoulder pain he has been having Depo-Medrol  injections every 3 months he reports he has been getting less relief knee injections providing about 2 months of relief.  He would like to consider total knee arthroplasty    Assessment & Plan: Visit Diagnoses: No diagnosis found.  Plan: Depo-Medrol  injection bilateral knees right shoulder have had him follow-up in 2 months with Dr. Harden to discuss total knee arthroplasty  Follow-Up Instructions: No follow-ups on file.   Ortho Exam  Patient is alert, oriented, no adenopathy, well-dressed, normal affect, normal respiratory effort. On examination right shoulder the patient has abduction flexion to about 90 degrees.  Crepitation with range of motion he does have good internal and external rotation with passive range of motion 220 degrees.  No adhesive capsulitis.  Bilateral knees with medial joint line tenderness crepitation with range of motion.  The collaterals and cruciates are stable.  There is no edema no erythema no effusion    Imaging: No results found. No images are attached to the encounter.  Labs: Lab Results  Component Value Date   HGBA1C 8.5 (H) 03/31/2023   HGBA1C 8.7 (H) 10/19/2020   HGBA1C 9.8 (H) 09/08/2020   ESRSEDRATE 14 03/30/2023   ESRSEDRATE 11 03/11/2019   CRP 13.4 (H) 03/30/2023   CRP 1.8 (H) 03/11/2019   REPTSTATUS 04/04/2023 FINAL 03/30/2023   GRAMSTAIN  07/19/2016    RARE WBC PRESENT, PREDOMINANTLY MONONUCLEAR NO ORGANISMS SEEN    CULT  03/30/2023    NO GROWTH 5 DAYS Performed at [facility], [address] [place], [place] [address]      Lab Results  Component Value Date   ALBUMIN  3.3 (L) 03/31/2023   ALBUMIN  3.7 03/30/2023   ALBUMIN  3.5 09/29/2020    Lab Results  Component Value Date   MG 2.3 04/01/2023   MG 2.1 03/31/2023   No results found for: VD25OH  No results found for: PREALBUMIN    Latest Ref Rng & Units 04/02/2023    3:07 AM 04/01/2023    3:52 AM 03/31/2023    4:39 AM  CBC EXTENDED  WBC 4.0 - 10.5 K/uL 5.9  6.5  9.5   RBC 4.22 - 5.81 MIL/uL 4.27  4.20  4.18   Hemoglobin 13.0 - 17.0 g/dL 86.8  87.0  86.9   HCT 39.0 - 52.0 % 41.6  40.9  40.2   Platelets 150 - 400 K/uL 142  128  129      There is no height or weight on file to calculate BMI.  Orders:  No orders of the defined types were placed in this encounter.  No orders of the defined types were placed in this encounter.    Procedures: Large Joint Inj: bilateral knee on 10/12/2023 10:14 AM Indications: pain Details: 18 G 1.5 in needle, anteromedial approach Medications (Right): 5 mL lidocaine  1 %; 40 mg methylPREDNISolone  acetate 40 MG/ML Medications (Left): 5 mL  lidocaine  1 %; 40 mg methylPREDNISolone  acetate 40 MG/ML Consent was given by the patient.    Large Joint Inj: R subacromial bursa on 10/12/2023 10:14 AM Indications: diagnostic evaluation and pain Details: 22 G 1.5 in needle  Arthrogram: No  Medications: 5 mL lidocaine  1 %; 40 mg methylPREDNISolone  acetate 40 MG/ML Outcome: tolerated well, no immediate complications Procedure, treatment alternatives, risks and benefits explained, specific risks discussed. Consent was given by the patient. Immediately prior to procedure a time out was called to verify the correct patient, procedure, equipment, support staff and site/side marked as required. Patient was prepped and draped in the usual sterile fashion.     No Clinical Data: No additional findings.  ROS:  All other systems negative, except as noted in the  HPI. Review of Systems  Objective: Vital Signs: There were no vitals taken for this visit.  Specialty Comments:  Ct Myelogram  IMPRESSION:  1. L5-S1: Severe multifactorial spinal stenosis. Advanced bilateral  facet arthropathy with 5 mm of anterolisthesis which increases to 8  mm with standing and flexion. Bulging of the disc. 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 Marked constriction of the thecal sac  due to these processes in combination with abundant epidural fat.  Severe bilateral foraminal stenosis could compress either or both  exiting L5 nerves.  2. L4-5: Moderate multifactorial spinal stenosis.  3. T12-L1: Right foraminal encroachment by osteophyte could possibly  affect the right T12 nerve.    Aortic Atherosclerosis (ICD10-I70.0).      Electronically Signed    By: Oneil Officer M.D.    On: 06/29/2020 12:39  PMFS History: Patient Active Problem List   Diagnosis Date Noted   History of CAD (coronary artery disease) 03/31/2023   Hyperlipidemia 03/31/2023   Peripheral neuropathy 03/31/2023   Thrombocytopenia ([name]) 03/31/2023   Hyponatremia 03/31/2023   Metabolic acidosis 03/31/2023   Cellulitis of right leg 03/31/2023   Sepsis due to cellulitis ([name]) 03/30/2023   Diabetic neuropathy ([name]) 10/31/2021   Pain due to onychomycosis of toenails of both feet 01/21/2021   Phimosis of penis 10/06/2020   Cellulitis 09/29/2020   Cellulitis of right toe 09/29/2020   Chronic systolic CHF (congestive heart failure) reduced EF 35% ([name]) 09/15/2020   Onychomycosis of toenail 08/24/2020   Non-insulin  dependent type 2 diabetes mellitus ([name]) 07/23/2020   Type 2 diabetes mellitus with hyperglycemia, without long-term current use of insulin  ([name]) 07/23/2020   Weight gain 07/23/2020   Type 2 diabetes mellitus with peripheral neuropathy ([name]) 07/23/2020   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 diabetes mellitus  ([name]) 05/04/2020   Hypertensive heart disease with heart failure ([name]) 04/26/2020   Type 2 diabetes mellitus with diabetic peripheral angiopathy without gangrene, without long-term current use of insulin  ([name]) 04/26/2020   OSA (obstructive sleep apnea) 04/26/2020   PVD (peripheral vascular disease) ([name]) 04/26/2020   CAD in native artery 04/26/2020   Class 2 severe obesity due to excess calories with serious comorbidity and body mass index (BMI) of 38.0 to 38.9 in adult [facility]) 04/26/2020   Impingement syndrome of right shoulder    Nontraumatic complete tear of right rotator cuff    Type 2 diabetes mellitus with foot ulcer, without long-term current use of insulin  ([name])    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   S/P debridement 07/19/2016   Acute gout of left elbow 07/19/2016   Past Medical History:  Diagnosis Date   5 years ago    AICD (automatic cardioverter/defibrillator) present    Anxiety    Arthritis    Cataract    CHF (congestive heart failure) ([name])    Coronary artery disease    Diabetes mellitus without complication ([name])    Diabetic retinopathy ([name])    Dysrhythmia    GERD (gastroesophageal reflux disease)    PMH   Hypertension    Hypertensive retinopathy    Peripheral vascular disease ([name])    Peripheral vascular disease ([name])    Pneumonia    Shoulder impingement, right    Sleep apnea    wears cpap   Stomach ulcer    Wears glasses     Family History  Problem Relation Age of Onset   Heart disease Mother    Diabetes Mother    Peripheral vascular disease Mother     Past Surgical History:  Procedure Laterality Date   AMPUTATION Left 03/12/2019   Procedure: 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;  Surgeon: Harden Jerona GAILS, MD;  Location: [facility] OR;  Service: Orthopedics;  Laterality: Left;   AMPUTATION Right 10/01/2020   Procedure: RIGHT 2nd TOE AMPUTATION;  Surgeon: Tobie Franky SQUIBB, DPM;  Location: [facility] OR;  Service: Podiatry;  Laterality: Right;   CARDIAC  CATHETERIZATION     CARDIAC DEFIBRILLATOR PLACEMENT     CIRCUMCISION N/A 11/05/2020   Procedure: CIRCUMCISION ADULT;  Surgeon: Watt Rush, MD;  Location: [facility] ORS;  Service: Urology;  Laterality: N/A;   I & D EXTREMITY Left 07/19/2016   Procedure: IRRIGATION AND DEBRIDEMENT EXTREMITY/OLECRANON(WASHOUT);  Surgeon: Jerri Kay HERO, MD;  Location: [facility] OR;  Service: Orthopedics;  Laterality: Left;   OLECRANON BURSECTOMY Left 07/19/2016   Procedure: LEFT ELBOW OLECRANON BURSECTOMY;  Surgeon: Jerri Kay HERO, MD;  Location: [facility];  Service: Orthopedics;  Laterality: Left;   SHOULDER ARTHROSCOPY Right 05/16/2019   Procedure: Right Shoulder Arthroscopy;  Surgeon: Harden Jerona GAILS, MD;  Location: [facility] OR;  Service: Orthopedics;  Laterality: Right;   Social History   Occupational History   Not on file  Tobacco Use   Smoking status: Former    Current packs/day: 0.00    Average packs/day: 1 pack/day for 40.0 years (40.0 ttl pk-yrs)    Types: Cigarettes    Start date: 03/11/1979    Quit date: 03/11/2019    Years since quitting: 4.5   Smokeless tobacco: Never  Vaping Use   Vaping status: Never Used  Substance and Sexual Activity   Alcohol use: Not Currently   Drug use: Not Currently    Types: Oxycodone    Sexual activity: Not on file

## 2023-10-19 NOTE — Telephone Encounter (Signed)
 Upon patient request DME selection is [employer]. Cpap setting  Patient machine has broken down after only 2 years. His insurance company says have your Dr to write a new Rx so he can get a loaner machine or a new machine from your dme. Jon Jones, Jon Jones 06/27/2023 - 07/26/2023 DOB: 07/04/1955 Age: 68 years Gender: Male [facility] [place] [address] [place] , [address] Phone: [phone] Email: [email or web address] Compliance Report Compliance Payor Standard Usage 06/27/2023 - 07/26/2023 Usage days 18/30 days (60%) >= 4 hours 17 days (57%) < 4 hours 1 days (3%) Usage hours 105 hours 56 minutes Average usage (total days) 3 hours 32 minutes Average usage (days used) 5 hours 53 minutes Median usage (days used) 5 hours 47 minutes Total used hours (value since last reset - 07/26/2023) 2,561 hours AirSense 11 AutoSet Serial number [id number] Mode AutoSet Min Pressure 13 cmH2O Max Pressure 20 cmH2O EPR Fulltime EPR level 3 Response Standard Therapy Pressure - cmH2O Median: 13.1 95th percentile: 13.8 Maximum: 14.2 Leaks - L/min Median: 5.5 95th percentile: 21.2 Maximum: 27.8 Events per hour AI: 0.6 HI: 0.8 AHI: 1.4 Apnea Index Central: 0.3 Obstructive: 0.2 Unknown: 0.1 RERA Index 0.0 Cheyne-Stokes respiration (average duration per night) 0 minutes (0%) Usage - hours Printed on 10/19/2023 - ResMed AirView version 4.49.0-5.0 Page 1 of 1

## 2023-10-22 ENCOUNTER — Ambulatory Visit: Admitting: Family Medicine

## 2023-10-22 NOTE — Telephone Encounter (Signed)
 Per Dr Shlomo, Please order a new ResMed Airsense 11 auto CPAP at 13-18cm H2O with heated humidity and mask of choice.   Order faxed to [facility] today.

## 2023-11-02 ENCOUNTER — Ambulatory Visit: Admitting: Internal Medicine

## 2023-11-09 ENCOUNTER — Ambulatory Visit: Admitting: Internal Medicine

## 2023-11-15 ENCOUNTER — Encounter: Admitting: Orthopedic Surgery

## 2023-12-04 ENCOUNTER — Encounter

## 2023-12-05 NOTE — Progress Notes (Signed)
Remote ICD Transmission.

## 2023-12-06 DIAGNOSIS — G4733 Obstructive sleep apnea (adult) (pediatric): Secondary | ICD-10-CM | POA: Diagnosis not present

## 2023-12-07 ENCOUNTER — Ambulatory Visit (INDEPENDENT_AMBULATORY_CARE_PROVIDER_SITE_OTHER)

## 2023-12-07 DIAGNOSIS — I5042 Chronic combined systolic (congestive) and diastolic (congestive) heart failure: Secondary | ICD-10-CM

## 2023-12-08 ENCOUNTER — Ambulatory Visit: Payer: Self-pay | Admitting: Cardiovascular Disease

## 2023-12-08 LAB — CUP PACEART REMOTE DEVICE CHECK
Battery Remaining Longevity: 36 mo
Battery Voltage: 2.95 V
Brady Statistic AP VP Percent: 0 %
Brady Statistic AP VS Percent: 0.74 %
Brady Statistic AS VP Percent: 0.04 %
Brady Statistic AS VS Percent: 99.22 %
Brady Statistic RA Percent Paced: 0.74 %
Brady Statistic RV Percent Paced: 0.04 %
Date Time Interrogation Session: 20250926043624
HighPow Impedance: 82 Ohm
Implantable Lead Connection Status: 753985
Implantable Lead Connection Status: 753985
Implantable Lead Implant Date: 20180702
Implantable Lead Implant Date: 20180702
Implantable Lead Location: 753859
Implantable Lead Location: 753860
Implantable Lead Model: 5076
Implantable Pulse Generator Implant Date: 20180702
Lead Channel Impedance Value: 380 Ohm
Lead Channel Impedance Value: 380 Ohm
Lead Channel Impedance Value: 494 Ohm
Lead Channel Pacing Threshold Amplitude: 0.5 V
Lead Channel Pacing Threshold Amplitude: 0.625 V
Lead Channel Pacing Threshold Pulse Width: 0.4 ms
Lead Channel Pacing Threshold Pulse Width: 0.4 ms
Lead Channel Sensing Intrinsic Amplitude: 3.375 mV
Lead Channel Sensing Intrinsic Amplitude: 3.375 mV
Lead Channel Sensing Intrinsic Amplitude: 4.625 mV
Lead Channel Sensing Intrinsic Amplitude: 4.625 mV
Lead Channel Setting Pacing Amplitude: 1.5 V
Lead Channel Setting Pacing Amplitude: 2 V
Lead Channel Setting Pacing Pulse Width: 0.4 ms
Lead Channel Setting Sensing Sensitivity: 0.3 mV
Zone Setting Status: 755011
Zone Setting Status: 755011

## 2023-12-12 NOTE — Progress Notes (Signed)
 Remote ICD Transmission

## 2023-12-13 ENCOUNTER — Ambulatory Visit (INDEPENDENT_AMBULATORY_CARE_PROVIDER_SITE_OTHER): Admitting: Orthopedic Surgery

## 2023-12-13 ENCOUNTER — Other Ambulatory Visit: Payer: Self-pay | Admitting: Radiology

## 2023-12-13 ENCOUNTER — Telehealth: Payer: Self-pay | Admitting: Orthopedic Surgery

## 2023-12-13 DIAGNOSIS — M170 Bilateral primary osteoarthritis of knee: Secondary | ICD-10-CM

## 2023-12-13 DIAGNOSIS — M5416 Radiculopathy, lumbar region: Secondary | ICD-10-CM

## 2023-12-13 DIAGNOSIS — M48062 Spinal stenosis, lumbar region with neurogenic claudication: Secondary | ICD-10-CM

## 2023-12-13 DIAGNOSIS — M4316 Spondylolisthesis, lumbar region: Secondary | ICD-10-CM

## 2023-12-13 NOTE — Telephone Encounter (Signed)
 placed

## 2023-12-13 NOTE — Progress Notes (Signed)
 Office Visit Note   [name]           Date of Birth: [birth year]           MRN: [id number] Visit Date: 12/13/2023              Requested by: Linde Bitters, PA-C [address] [place],  [place] [address] PCP: Linde Bitters, PA-C  Chief Complaint  Patient presents with   Right Knee - Pain   Left Knee - Pain      HPI: Discussed the use of AI scribe software for clinical note transcription with the patient, who gave verbal consent to proceed.  History of Present Illness Jon Jones is a 68 year old male with tricompartmental arthritis who presents with knee pain.  He experiences significant knee pain, describing it as 'killing me'. The pain relief from knee injections, which previously lasted about three months, now only provides relief for two months before the pain returns. The pain is severe enough to impact daily activities, such as tying his shoes.  Radiographs from 2021 showed tricompartmental arthritis with bone-on-bone contact in the medial joint line. He has received injections for his knee pain, with the last set administered in August.  He has type 2 diabetes, with a last known hemoglobin A1c of 8.1. He weighs approximately 225 pounds and is 5 feet 6 inches tall. He is concerned about his weight in relation to his knee issues and potential treatment options.     Assessment & Plan: Visit Diagnoses:  1. Bilateral primary osteoarthritis of knee     Plan: Assessment and Plan Assessment & Plan Bilateral knee osteoarthritis with flexion contracture and varus alignment Chronic bilateral knee osteoarthritis with significant pain and functional limitation. Radiographs show tricompartmental arthritic changes with bone on bone contact. Antalgic gait and slight flexion contracture with varus alignment noted. Surgical intervention considered, but preoperative optimization required due to elevated hemoglobin A1c and potential weight reduction. Discussed  surgical risks including increased pain, infection, and blood clots, especially with diabetes and popliteal artery calcification. Emphasized pre-surgery rehabilitation for knee strength and postoperative recovery. Full recovery expected in three to four months. - Set up pre-surgery rehabilitation to improve knee strength. - Discuss surgical risks and postoperative protocol. - Advise on improving hemoglobin A1c to below 7 for surgical eligibility. - Discuss potential need for weight reduction based on insurance criteria. - Follow up in several weeks for knee injections. - Obtain two-view radiographs of both knees at follow-up.  Bilateral popliteal artery calcification Bilateral popliteal artery calcification increases risk of complications during knee surgery. Discussed risks as part of surgical planning.  Obtain 2 view radiographs of both knees at follow-up for surgical planning      Follow-Up Instructions: Return in about 4 weeks (around 01/10/2024).   Ortho Exam  Patient is alert, oriented, no adenopathy, well-dressed, normal affect, normal respiratory effort. Physical Exam MEASUREMENTS: Height- 5'6 to 5'7, Weight- 225. MUSCULOSKELETAL: Antalgic gait. Slight flexion contracture with varus alignment bilaterally.  Crepitation with range of motion.  There is no effusion.      Imaging: No results found. No images are attached to the encounter.  Labs: Lab Results  Component Value Date   HGBA1C 8.5 (H) 03/31/2023   HGBA1C 8.7 (H) 10/19/2020   HGBA1C 9.8 (H) 09/08/2020   ESRSEDRATE 14 03/30/2023   ESRSEDRATE 11 03/11/2019   CRP 13.4 (H) 03/30/2023   CRP 1.8 (H) 03/11/2019   REPTSTATUS 04/04/2023 FINAL 03/30/2023   GRAMSTAIN  07/19/2016    RARE WBC PRESENT, PREDOMINANTLY MONONUCLEAR NO ORGANISMS SEEN    CULT  03/30/2023    NO GROWTH 5 DAYS Performed at [facility], [address] [place], [place] [address]      Lab Results  Component Value Date   ALBUMIN   3.3 (L) 03/31/2023   ALBUMIN  3.7 03/30/2023   ALBUMIN  3.5 09/29/2020    Lab Results  Component Value Date   MG 2.3 04/01/2023   MG 2.1 03/31/2023   No results found for: VD25OH  No results found for: PREALBUMIN    Latest Ref Rng & Units 04/02/2023    3:07 AM 04/01/2023    3:52 AM 03/31/2023    4:39 AM  CBC EXTENDED  WBC 4.0 - 10.5 K/uL 5.9  6.5  9.5   RBC 4.22 - 5.81 MIL/uL 4.27  4.20  4.18   Hemoglobin 13.0 - 17.0 g/dL 86.8  87.0  86.9   HCT 39.0 - 52.0 % 41.6  40.9  40.2   Platelets 150 - 400 K/uL 142  128  129      There is no height or weight on file to calculate BMI.  Orders:  No orders of the defined types were placed in this encounter.  No orders of the defined types were placed in this encounter.    Procedures: No procedures performed  Clinical Data: No additional findings.  ROS:  All other systems negative, except as noted in the HPI. Review of Systems  Objective: Vital Signs: There were no vitals taken for this visit.  Specialty Comments:  Ct Myelogram  IMPRESSION:  1. L5-S1: Severe multifactorial spinal stenosis. Advanced bilateral  facet arthropathy with 5 mm of anterolisthesis which increases to 8  mm with standing and flexion. Bulging of the disc. 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 Marked constriction of the thecal sac  due to these processes in combination with abundant epidural fat.  Severe bilateral foraminal stenosis could compress either or both  exiting L5 nerves.  2. L4-5: Moderate multifactorial spinal stenosis.  3. T12-L1: Right foraminal encroachment by osteophyte could possibly  affect the right T12 nerve.    Aortic Atherosclerosis (ICD10-I70.0).      Electronically Signed    By: Oneil Officer M.D.    On: 06/29/2020 12:39  PMFS History: Patient Active Problem List   Diagnosis Date Noted   History of CAD (coronary artery disease)  03/31/2023   Hyperlipidemia 03/31/2023   Peripheral neuropathy 03/31/2023   Thrombocytopenia 03/31/2023   Hyponatremia 03/31/2023   Metabolic acidosis 03/31/2023   Cellulitis of right leg 03/31/2023   Sepsis due to cellulitis ([name]) 03/30/2023   Diabetic neuropathy ([name]) 10/31/2021   Pain due to onychomycosis of toenails of both feet 01/21/2021   Phimosis of penis 10/06/2020   Cellulitis 09/29/2020   Cellulitis of right toe 09/29/2020   Chronic systolic CHF (congestive heart failure) reduced EF 35% ([name]) 09/15/2020   Onychomycosis of toenail 08/24/2020   Non-insulin  dependent type 2 diabetes mellitus ([name]) 07/23/2020   Type 2 diabetes mellitus with hyperglycemia, without long-term current use of insulin  ([name]) 07/23/2020   Weight gain 07/23/2020   Type 2 diabetes mellitus with peripheral neuropathy ([name]) 07/23/2020   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 diabetes mellitus ([name]) 05/04/2020   Hypertensive heart disease with heart failure ([name]) 04/26/2020   Type 2 diabetes mellitus with diabetic peripheral angiopathy without gangrene, without  long-term current use of insulin  ([name]) 04/26/2020   OSA (obstructive sleep apnea) 04/26/2020   PVD (peripheral vascular disease) 04/26/2020   CAD in native artery 04/26/2020   Class 2 severe obesity due to excess calories with serious comorbidity and body mass index (BMI) of 38.0 to 38.9 in adult 04/26/2020   Impingement syndrome of right shoulder    Nontraumatic complete tear of right rotator cuff    Type 2 diabetes mellitus with foot ulcer, without long-term current use of insulin  ([name])    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   S/P debridement 07/19/2016   Acute gout of left elbow 07/19/2016   Past Medical History:  Diagnosis Date   5 years ago    AICD (automatic cardioverter/defibrillator) present    Anxiety    Arthritis    Cataract    CHF (congestive heart failure) ([name])    Coronary artery disease    Diabetes mellitus  without complication ([name])    Diabetic retinopathy ([name])    Dysrhythmia    GERD (gastroesophageal reflux disease)    PMH   Hypertension    Hypertensive retinopathy    Peripheral vascular disease    Peripheral vascular disease    Pneumonia    Shoulder impingement, right    Sleep apnea    wears cpap   Stomach ulcer    Wears glasses     Family History  Problem Relation Age of Onset   Heart disease Mother    Diabetes Mother    Peripheral vascular disease Mother     Past Surgical History:  Procedure Laterality Date   AMPUTATION Left 03/12/2019   Procedure: 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;  Surgeon: Harden Jerona GAILS, MD;  Location: [facility] OR;  Service: Orthopedics;  Laterality: Left;   AMPUTATION Right 10/01/2020   Procedure: RIGHT 2nd TOE AMPUTATION;  Surgeon: Tobie Franky SQUIBB, DPM;  Location: [facility] OR;  Service: Podiatry;  Laterality: Right;   CARDIAC CATHETERIZATION     CARDIAC DEFIBRILLATOR PLACEMENT     CIRCUMCISION N/A 11/05/2020   Procedure: CIRCUMCISION ADULT;  Surgeon: Watt Rush, MD;  Location: [facility]S;  Service: Urology;  Laterality: N/A;   I & D EXTREMITY Left 07/19/2016   Procedure: IRRIGATION AND DEBRIDEMENT EXTREMITY/OLECRANON(WASHOUT);  Surgeon: Jerri Kay HERO, MD;  Location: [facility] OR;  Service: Orthopedics;  Laterality: Left;   OLECRANON BURSECTOMY Left 07/19/2016   Procedure: LEFT ELBOW OLECRANON BURSECTOMY;  Surgeon: Jerri Kay HERO, MD;  Location: [facility];  Service: Orthopedics;  Laterality: Left;   SHOULDER ARTHROSCOPY Right 05/16/2019   Procedure: Right Shoulder Arthroscopy;  Surgeon: Harden Jerona GAILS, MD;  Location: [facility] OR;  Service: Orthopedics;  Laterality: Right;   Social History   Occupational History   Not on file  Tobacco Use   Smoking status: Former    Current packs/day: 0.00    Average packs/day: 1 pack/day for 40.0 years (40.0 ttl pk-yrs)    Types: Cigarettes    Start date: 03/11/1979    Quit date: 03/11/2019    Years since quitting: 4.7   Smokeless tobacco:  Never  Vaping Use   Vaping status: Never Used  Substance and Sexual Activity   Alcohol use: Not Currently   Drug use: Not Currently    Types: Oxycodone    Sexual activity: Not on file

## 2023-12-13 NOTE — Telephone Encounter (Signed)
 Pt request a referral for 2nd op to Dr Arley Helling

## 2024-01-01 ENCOUNTER — Other Ambulatory Visit: Payer: Self-pay | Admitting: General Practice

## 2024-01-14 ENCOUNTER — Encounter: Payer: Self-pay | Admitting: Orthopedic Surgery

## 2024-01-14 ENCOUNTER — Ambulatory Visit (INDEPENDENT_AMBULATORY_CARE_PROVIDER_SITE_OTHER): Admitting: Orthopedic Surgery

## 2024-01-14 ENCOUNTER — Encounter: Payer: Self-pay | Admitting: Radiology

## 2024-01-14 ENCOUNTER — Other Ambulatory Visit (INDEPENDENT_AMBULATORY_CARE_PROVIDER_SITE_OTHER)

## 2024-01-14 DIAGNOSIS — M25562 Pain in left knee: Secondary | ICD-10-CM

## 2024-01-14 DIAGNOSIS — M25561 Pain in right knee: Secondary | ICD-10-CM | POA: Diagnosis not present

## 2024-01-14 DIAGNOSIS — M170 Bilateral primary osteoarthritis of knee: Secondary | ICD-10-CM

## 2024-01-14 DIAGNOSIS — M7541 Impingement syndrome of right shoulder: Secondary | ICD-10-CM

## 2024-01-14 DIAGNOSIS — G8929 Other chronic pain: Secondary | ICD-10-CM

## 2024-01-14 MED ORDER — IBUPROFEN 800 MG PO TABS: 800.0000 mg | ORAL_TABLET | Freq: Three times a day (TID) | ORAL | 3 refills | Status: AC | PRN

## 2024-01-14 MED ORDER — METHYLPREDNISOLONE ACETATE 40 MG/ML IJ SUSP
40.0000 mg | INTRAMUSCULAR | Status: AC | PRN
  Administered 2024-01-14: 40 mg via INTRA_ARTICULAR

## 2024-01-14 MED ORDER — LIDOCAINE HCL 1 % IJ SOLN
5.0000 mL | INTRAMUSCULAR | Status: AC | PRN
  Administered 2024-01-14: 5 mL

## 2024-01-14 NOTE — Progress Notes (Signed)
 Office Visit Note   [name]           Date of Birth: 1955-07-05           MRN: [id number] Visit Date: 01/14/2024              Requested by: Linde Bitters, PA-C [address] [place],  [place] [address] PCP: Linde Bitters, PA-C  Chief Complaint  Patient presents with   Right Shoulder - Pain    10/2023 injection right shoulder and bilateral knees    Left Knee - Pain   Right Knee - Pain      HPI: Discussed the use of AI scribe software for clinical note transcription with the patient, who gave verbal consent to proceed.  History of Present Illness Jon Jones is a 67 year old male with diabetes who presents with knee pain due to arthritis.  He experiences significant knee pain, particularly in the right knee. The pain worsens with activities such as walking, and he describes a sensation of his feet giving way. He also hears noises when tying his shoelaces, which he attributes to arthritis under the kneecap.  He has a history of diabetes, with blood sugar levels ranging from 107 to 130 mg/dL. No high blood pressure or high cholesterol, which are other potential contributors to the calcifications observed in his blood vessels behind the knees.  He has previously used ibuprofen  800 mg for pain management, taking it with food to minimize gastrointestinal side effects. He has run out of this medication and is interested in resuming its use.  He has a history of playing soccer, which he believes contributed to the wear and tear on his knees. He has considered knee replacement surgery but is apprehensive about the recovery process and the need for extensive physical therapy post-surgery.     Assessment & Plan: Visit Diagnoses:  1. Chronic pain of both knees   2. Bilateral primary osteoarthritis of knee     Plan: Assessment and Plan Assessment & Plan Bilateral knee osteoarthritis Extensive tricompartmental arthritis with bone-on-bone contact and  calcifications. Pain exacerbated by arthritis, leading to muscle relaxation and giving way. Discussed knee replacement surgery, emphasizing post-operative physical therapy and exercise for improved function and quality of life. - Prescribed ibuprofen  800 mg for pain management. - Recommended Voltaren gel for topical pain relief. - Discussed knee replacement surgery as a future option, emphasizing the need for post-operative physical therapy and exercise.  Right shoulder pain Pain with Neer and Hawkins impingement tests. Injection administered without complications. - Administered injection for right shoulder pain.      Follow-Up Instructions: No follow-ups on file.   Ortho Exam  Patient is alert, oriented, no adenopathy, well-dressed, normal affect, normal respiratory effort. Physical Exam MUSCULOSKELETAL: Crepitation with range of motion and varus alignment in both knees. Antalgic gait. Pain with Neer and Hawkins impingement test of the right shoulder.      Imaging: XR Knee 1-2 Views Right Result Date: 01/14/2024 2 view radiographs of the right knee shows tricompartmental arthritic changes with bone-on-bone contact medial joint line with varus alignment.  There is subchondral cyst and sclerosis with periarticular bony spurs.  XR Knee 1-2 Views Left Result Date: 01/14/2024 2 view radiographs of the left knee shows bone-on-bone contact medial joint line with varus alignment.  There is subchondral cyst and sclerosis in all 3 compartments with periarticular bony spurs.  There is calcification of the popliteal vessels.  No images are attached to the  encounter.  Labs: Lab Results  Component Value Date   HGBA1C 8.5 (H) 03/31/2023   HGBA1C 8.7 (H) 10/19/2020   HGBA1C 9.8 (H) 09/08/2020   ESRSEDRATE 14 03/30/2023   ESRSEDRATE 11 03/11/2019   CRP 13.4 (H) 03/30/2023   CRP 1.8 (H) 03/11/2019   REPTSTATUS 04/04/2023 FINAL 03/30/2023   GRAMSTAIN  07/19/2016    RARE WBC PRESENT,  PREDOMINANTLY MONONUCLEAR NO ORGANISMS SEEN    CULT  03/30/2023    NO GROWTH 5 DAYS Performed at [facility], [address] [place], [place] [address]      Lab Results  Component Value Date   ALBUMIN  3.3 (L) 03/31/2023   ALBUMIN  3.7 03/30/2023   ALBUMIN  3.5 09/29/2020    Lab Results  Component Value Date   MG 2.3 04/01/2023   MG 2.1 03/31/2023   No results found for: VD25OH  No results found for: PREALBUMIN    Latest Ref Rng & Units 04/02/2023    3:07 AM 04/01/2023    3:52 AM 03/31/2023    4:39 AM  CBC EXTENDED  WBC 4.0 - 10.5 K/uL 5.9  6.5  9.5   RBC 4.22 - 5.81 MIL/uL 4.27  4.20  4.18   Hemoglobin 13.0 - 17.0 g/dL 86.8  87.0  86.9   HCT 39.0 - 52.0 % 41.6  40.9  40.2   Platelets 150 - 400 K/uL 142  128  129      There is no height or weight on file to calculate BMI.  Orders:  Orders Placed This Encounter  Procedures   XR Knee 1-2 Views Right   XR Knee 1-2 Views Left   Meds ordered this encounter  Medications   ibuprofen  (ADVIL ) 800 MG tablet    Sig: Take 1 tablet (800 mg total) by mouth every 8 (eight) hours as needed.    Dispense:  60 tablet    Refill:  3     Procedures: Large Joint Inj: bilateral knee on 01/14/2024 12:03 PM Indications: pain and diagnostic evaluation Details: 22 G 1.5 in needle, anteromedial approach  Arthrogram: No  Medications (Right): 5 mL lidocaine  1 %; 40 mg methylPREDNISolone  acetate 40 MG/ML Medications (Left): 5 mL lidocaine  1 %; 40 mg methylPREDNISolone  acetate 40 MG/ML Outcome: tolerated well, no immediate complications Procedure, treatment alternatives, risks and benefits explained, specific risks discussed. Consent was given by the patient. Immediately prior to procedure a time out was called to verify the correct patient, procedure, equipment, support staff and site/side marked as required. Patient was prepped and draped in the usual sterile fashion.    Large Joint Inj: R subacromial bursa on 01/14/2024  12:03 PM Indications: diagnostic evaluation and pain Details: 22 G 1.5 in needle, posterior approach  Arthrogram: No  Medications: 5 mL lidocaine  1 %; 40 mg methylPREDNISolone  acetate 40 MG/ML Outcome: tolerated well, no immediate complications Procedure, treatment alternatives, risks and benefits explained, specific risks discussed. Consent was given by the patient. Immediately prior to procedure a time out was called to verify the correct patient, procedure, equipment, support staff and site/side marked as required. Patient was prepped and draped in the usual sterile fashion.      Clinical Data: No additional findings.  ROS:  All other systems negative, except as noted in the HPI. Review of Systems  Objective: Vital Signs: There were no vitals taken for this visit.  Specialty Comments:  Ct Myelogram  IMPRESSION:  1. L5-S1: Severe multifactorial spinal stenosis. Advanced bilateral  facet arthropathy with 5  mm of anterolisthesis which increases to 8  mm with standing and flexion. Bulging of the disc. 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 Marked constriction of the thecal sac  due to these processes in combination with abundant epidural fat.  Severe bilateral foraminal stenosis could compress either or both  exiting L5 nerves.  2. L4-5: Moderate multifactorial spinal stenosis.  3. T12-L1: Right foraminal encroachment by osteophyte could possibly  affect the right T12 nerve.    Aortic Atherosclerosis (ICD10-I70.0).      Electronically Signed    By: Oneil Officer M.D.    On: 06/29/2020 12:39  PMFS History: Patient Active Problem List   Diagnosis Date Noted   History of CAD (coronary artery disease) 03/31/2023   Hyperlipidemia 03/31/2023   Peripheral neuropathy 03/31/2023   Thrombocytopenia 03/31/2023   Hyponatremia 03/31/2023   Metabolic acidosis 03/31/2023   Cellulitis of right leg  03/31/2023   Sepsis due to cellulitis ([name]) 03/30/2023   Diabetic neuropathy ([name]) 10/31/2021   Pain due to onychomycosis of toenails of both feet 01/21/2021   Phimosis of penis 10/06/2020   Cellulitis 09/29/2020   Cellulitis of right toe 09/29/2020   Chronic systolic CHF (congestive heart failure) reduced EF 35% ([name]) 09/15/2020   Onychomycosis of toenail 08/24/2020   Non-insulin  dependent type 2 diabetes mellitus ([name]) 07/23/2020   Type 2 diabetes mellitus with hyperglycemia, without long-term current use of insulin  ([name]) 07/23/2020   Weight gain 07/23/2020   Type 2 diabetes mellitus with peripheral neuropathy ([name]) 07/23/2020   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 diabetes mellitus ([name]) 05/04/2020   Hypertensive heart disease with heart failure ([name]) 04/26/2020   Type 2 diabetes mellitus with diabetic peripheral angiopathy without gangrene, without long-term current use of insulin  ([name]) 04/26/2020   OSA (obstructive sleep apnea) 04/26/2020   PVD (peripheral vascular disease) 04/26/2020   CAD in native artery 04/26/2020   Class 2 severe obesity due to excess calories with serious comorbidity and body mass index (BMI) of 38.0 to 38.9 in adult 04/26/2020   Impingement syndrome of right shoulder    Nontraumatic complete tear of right rotator cuff    Type 2 diabetes mellitus with foot ulcer, without long-term current use of insulin  ([name])    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   S/P debridement 07/19/2016   Acute gout of left elbow 07/19/2016   Past Medical History:  Diagnosis Date   5 years ago    AICD (automatic cardioverter/defibrillator) present    Anxiety    Arthritis    Cataract    CHF (congestive heart failure) ([name])    Coronary artery disease    Diabetes mellitus without complication ([name])    Diabetic retinopathy ([name])    Dysrhythmia    GERD (gastroesophageal reflux disease)    PMH   Hypertension    Hypertensive retinopathy    Peripheral vascular  disease    Peripheral vascular disease    Pneumonia    Shoulder impingement, right    Sleep apnea    wears cpap   Stomach ulcer    Wears glasses     Family History  Problem Relation Age of Onset   Heart disease Mother    Diabetes Mother    Peripheral vascular disease Mother     Past Surgical History:  Procedure Laterality Date   AMPUTATION Left 03/12/2019   Procedure: 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;  Surgeon:  Harden Jerona GAILS, MD;  Location: [facility] OR;  Service: Orthopedics;  Laterality: Left;   AMPUTATION Right 10/01/2020   Procedure: RIGHT 2nd TOE AMPUTATION;  Surgeon: Tobie Franky SQUIBB, DPM;  Location: [facility] OR;  Service: Podiatry;  Laterality: Right;   CARDIAC CATHETERIZATION     CARDIAC DEFIBRILLATOR PLACEMENT     CIRCUMCISION N/A 11/05/2020   Procedure: CIRCUMCISION ADULT;  Surgeon: Watt Rush, MD;  Location: [facility] ORS;  Service: Urology;  Laterality: N/A;   I & D EXTREMITY Left 07/19/2016   Procedure: IRRIGATION AND DEBRIDEMENT EXTREMITY/OLECRANON(WASHOUT);  Surgeon: Jerri Kay HERO, MD;  Location: [facility] OR;  Service: Orthopedics;  Laterality: Left;   OLECRANON BURSECTOMY Left 07/19/2016   Procedure: LEFT ELBOW OLECRANON BURSECTOMY;  Surgeon: Jerri Kay HERO, MD;  Location: [facility];  Service: Orthopedics;  Laterality: Left;   SHOULDER ARTHROSCOPY Right 05/16/2019   Procedure: Right Shoulder Arthroscopy;  Surgeon: Harden Jerona GAILS, MD;  Location: [facility] OR;  Service: Orthopedics;  Laterality: Right;   Social History   Occupational History   Not on file  Tobacco Use   Smoking status: Former    Current packs/day: 0.00    Average packs/day: 1 pack/day for 40.0 years (40.0 ttl pk-yrs)    Types: Cigarettes    Start date: 03/11/1979    Quit date: 03/11/2019    Years since quitting: 4.8   Smokeless tobacco: Never  Vaping Use   Vaping status: Never Used  Substance and Sexual Activity   Alcohol use: Not Currently   Drug use: Not Currently    Types: Oxycodone    Sexual activity: Not on file

## 2024-01-22 ENCOUNTER — Other Ambulatory Visit: Payer: Self-pay | Admitting: Physical Medicine and Rehabilitation

## 2024-01-22 ENCOUNTER — Telehealth: Payer: Self-pay

## 2024-01-22 DIAGNOSIS — M48062 Spinal stenosis, lumbar region with neurogenic claudication: Secondary | ICD-10-CM

## 2024-01-22 DIAGNOSIS — M5416 Radiculopathy, lumbar region: Secondary | ICD-10-CM

## 2024-01-22 NOTE — Telephone Encounter (Signed)
 Patient came by and advised that he was here for an injection in August but never received the injection and would like to get the injection now. Patient advised that will will check into referral and get back to paitent

## 2024-02-18 ENCOUNTER — Other Ambulatory Visit: Payer: Self-pay

## 2024-02-18 ENCOUNTER — Ambulatory Visit: Admitting: Physical Medicine and Rehabilitation

## 2024-02-18 VITALS — BP 118/76 | HR 79

## 2024-02-18 DIAGNOSIS — M5416 Radiculopathy, lumbar region: Secondary | ICD-10-CM

## 2024-02-18 MED ORDER — METHYLPREDNISOLONE ACETATE 40 MG/ML IJ SUSP
40.0000 mg | Freq: Once | INTRAMUSCULAR | Status: AC
  Administered 2024-02-18: 40 mg

## 2024-02-18 NOTE — Procedures (Signed)
 Lumbosacral Transforaminal Epidural Steroid Injection - Sub-Pedicular Approach with Fluoroscopic Guidance  [name]      Date of Birth: 07-Mar-1956 MRN: [id number] PCP: Linde Bitters, PA-C      Visit Date: 02/18/2024   Universal Protocol:    Date/Time: 02/18/2024  Consent Given By: the patient  Position: PRONE  Additional Comments: Vital signs were monitored before and after the procedure. Patient was prepped and draped in the usual sterile fashion. The correct patient, procedure, and site was verified.   Injection Procedure Details:   Procedure diagnoses: Lumbar radiculopathy [M54.16]    Meds Administered:  Meds ordered this encounter  Medications   methylPREDNISolone  acetate (DEPO-MEDROL ) injection 40 mg    Laterality: Right  Location/Site: L5  Needle:5.0 in., 22 ga.  Short bevel or Quincke spinal needle  Needle Placement: Transforaminal  Findings:    -Comments: Excellent flow of contrast along the nerve, nerve root and into the epidural space.  Procedure Details: After squaring off the end-plates to get a true AP view, the C-arm was positioned so that an oblique view of the foramen as noted above was visualized. The target area is just inferior to the nose of the scotty dog or sub pedicular. The soft tissues overlying this structure were infiltrated with 2-3 ml. of 1% Lidocaine  without Epinephrine .  The spinal needle was inserted toward the target using a trajectory view along the fluoroscope beam.  Under AP and lateral visualization, the needle was advanced so it did not puncture dura and was located close the 6 O'Clock position of the pedical in AP tracterory. Biplanar projections were used to confirm position. Aspiration was confirmed to be negative for CSF and/or blood. A 1-2 ml. volume of Isovue -250 was injected and flow of contrast was noted at each level. Radiographs were obtained for documentation purposes.   After attaining the desired flow  of contrast documented above, a 0.5 to 1.0 ml test dose of 0.25% Marcaine  was injected into each respective transforaminal space.  The patient was observed for 90 seconds post injection.  After no sensory deficits were reported, and normal lower extremity motor function was noted,   the above injectate was administered so that equal amounts of the injectate were placed at each foramen (level) into the transforaminal epidural space.   Additional Comments:  The patient tolerated the procedure well Dressing: 2 x 2 sterile gauze and Band-Aid    Post-procedure details: Patient was observed during the procedure. Post-procedure instructions were reviewed.  Patient left the clinic in stable condition.

## 2024-02-18 NOTE — Progress Notes (Signed)
 Jon Jones - 68 y.o. male MRN [id number]  Date of birth: 1955-05-30  Office Visit Note: Visit Date: 02/18/2024 PCP: Linde Bitters, PA-C Referred by: Toepfer, Olivia, PA-C  Subjective: Chief Complaint  Patient presents with   Lower Back - Pain   HPI:  Jon Jones is a 68 y.o. male who comes in today at the request of Duwaine Pouch, FNP for planned Right L5-S1 Lumbar Transforaminal epidural steroid injection with fluoroscopic guidance.  The patient has failed conservative care including home exercise, medications, time and activity modification.  This injection will be diagnostic and hopefully therapeutic.  Please see requesting physician notes for further details and justification.   ROS Otherwise per HPI.  Assessment & Plan: Visit Diagnoses:    ICD-10-CM   1. Lumbar radiculopathy  M54.16 XR C-ARM NO REPORT    Epidural Steroid injection    methylPREDNISolone  acetate (DEPO-MEDROL ) injection 40 mg      Plan: No additional findings.   Meds & Orders:  Meds ordered this encounter  Medications   methylPREDNISolone  acetate (DEPO-MEDROL ) injection 40 mg    Orders Placed This Encounter  Procedures   XR C-ARM NO REPORT   Epidural Steroid injection    Follow-up: Return for visit to requesting provider as needed.   Procedures: No procedures performed  Lumbosacral Transforaminal Epidural Steroid Injection - Sub-Pedicular Approach with Fluoroscopic Guidance  [name]      Date of Birth: January 02, 1956 MRN: [id number] PCP: Linde Bitters, PA-C      Visit Date: 02/18/2024   Universal Protocol:    Date/Time: 02/18/2024  Consent Given By: the patient  Position: PRONE  Additional Comments: Vital signs were monitored before and after the procedure. Patient was prepped and draped in the usual sterile fashion. The correct patient, procedure, and site was verified.   Injection Procedure Details:   Procedure diagnoses: Lumbar radiculopathy [M54.16]    Meds  Administered:  Meds ordered this encounter  Medications   methylPREDNISolone  acetate (DEPO-MEDROL ) injection 40 mg    Laterality: Right  Location/Site: L5  Needle:5.0 in., 22 ga.  Short bevel or Quincke spinal needle  Needle Placement: Transforaminal  Findings:    -Comments: Excellent flow of contrast along the nerve, nerve root and into the epidural space.  Procedure Details: After squaring off the end-plates to get a true AP view, the C-arm was positioned so that an oblique view of the foramen as noted above was visualized. The target area is just inferior to the nose of the scotty dog or sub pedicular. The soft tissues overlying this structure were infiltrated with 2-3 ml. of 1% Lidocaine  without Epinephrine .  The spinal needle was inserted toward the target using a trajectory view along the fluoroscope beam.  Under AP and lateral visualization, the needle was advanced so it did not puncture dura and was located close the 6 O'Clock position of the pedical in AP tracterory. Biplanar projections were used to confirm position. Aspiration was confirmed to be negative for CSF and/or blood. A 1-2 ml. volume of Isovue -250 was injected and flow of contrast was noted at each level. Radiographs were obtained for documentation purposes.   After attaining the desired flow of contrast documented above, a 0.5 to 1.0 ml test dose of 0.25% Marcaine  was injected into each respective transforaminal space.  The patient was observed for 90 seconds post injection.  After no sensory deficits were reported, and normal lower extremity motor function was noted,   the above injectate was administered so that equal amounts  of the injectate were placed at each foramen (level) into the transforaminal epidural space.   Additional Comments:  The patient tolerated the procedure well Dressing: 2 x 2 sterile gauze and Band-Aid    Post-procedure details: Patient was observed during the procedure. Post-procedure  instructions were reviewed.  Patient left the clinic in stable condition.    Clinical History: Ct Myelogram  IMPRESSION:  1. L5-S1: Severe multifactorial spinal stenosis. Advanced bilateral  facet arthropathy with 5 mm of anterolisthesis which increases to 8  mm with standing and flexion. Bulging of the disc. 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 Marked constriction of the thecal sac  due to these processes in combination with abundant epidural fat.  Severe bilateral foraminal stenosis could compress either or both  exiting L5 nerves.  2. L4-5: Moderate multifactorial spinal stenosis.  3. T12-L1: Right foraminal encroachment by osteophyte could possibly  affect the right T12 nerve.    Aortic Atherosclerosis (ICD10-I70.0).      Electronically Signed    By: Oneil Officer M.D.    On: 06/29/2020 12:39     Objective:  VS:  HT:    WT:   BMI:     BP:118/76  HR:79bpm  TEMP: ( )  RESP:  Physical Exam Vitals and nursing note reviewed.  Constitutional:      General: He is not in acute distress.    Appearance: Normal appearance. He is not ill-appearing.  HENT:     Head: Normocephalic and atraumatic.     Right Ear: External ear normal.     Left Ear: External ear normal.     Nose: No congestion.  Eyes:     Extraocular Movements: Extraocular movements intact.  Cardiovascular:     Rate and Rhythm: Normal rate.     Pulses: Normal pulses.  Pulmonary:     Effort: Pulmonary effort is normal. No respiratory distress.  Abdominal:     General: There is no distension.     Palpations: Abdomen is soft.  Musculoskeletal:        General: No tenderness or signs of injury.     Cervical back: Neck supple.     Right lower leg: No edema.     Left lower leg: No edema.     Comments: Patient has good distal strength without clonus.  Skin:    Findings: No erythema or rash.  Neurological:     General: No focal  deficit present.     Mental Status: He is alert and oriented to person, place, and time.     Sensory: No sensory deficit.     Motor: No weakness or abnormal muscle tone.     Coordination: Coordination normal.  Psychiatric:        Mood and Affect: Mood normal.        Behavior: Behavior normal.      Imaging: XR C-ARM NO REPORT Result Date: 02/18/2024 Please see Notes tab for imaging impression.

## 2024-02-18 NOTE — Progress Notes (Signed)
 Pain Scale   Average Pain 5 Patient advising he has chronic lower back pain radiating to right leg. Patient advising that pain is constant.        +Driver, -BT, -Dye Allergies.

## 2024-03-04 ENCOUNTER — Encounter

## 2024-03-07 ENCOUNTER — Ambulatory Visit

## 2024-03-07 DIAGNOSIS — I5042 Chronic combined systolic (congestive) and diastolic (congestive) heart failure: Secondary | ICD-10-CM | POA: Diagnosis not present

## 2024-03-09 LAB — CUP PACEART REMOTE DEVICE CHECK
Battery Remaining Longevity: 33 mo
Battery Voltage: 2.95 V
Brady Statistic AP VP Percent: 0 %
Brady Statistic AP VS Percent: 1.04 %
Brady Statistic AS VP Percent: 0.04 %
Brady Statistic AS VS Percent: 98.92 %
Brady Statistic RA Percent Paced: 1.04 %
Brady Statistic RV Percent Paced: 0.04 %
Date Time Interrogation Session: 20251226033524
HighPow Impedance: 80 Ohm
Implantable Lead Connection Status: 753985
Implantable Lead Connection Status: 753985
Implantable Lead Implant Date: 20180702
Implantable Lead Implant Date: 20180702
Implantable Lead Location: 753859
Implantable Lead Location: 753860
Implantable Lead Model: 5076
Implantable Pulse Generator Implant Date: 20180702
Lead Channel Impedance Value: 380 Ohm
Lead Channel Impedance Value: 399 Ohm
Lead Channel Impedance Value: 456 Ohm
Lead Channel Pacing Threshold Amplitude: 0.5 V
Lead Channel Pacing Threshold Amplitude: 0.625 V
Lead Channel Pacing Threshold Pulse Width: 0.4 ms
Lead Channel Pacing Threshold Pulse Width: 0.4 ms
Lead Channel Sensing Intrinsic Amplitude: 3 mV
Lead Channel Sensing Intrinsic Amplitude: 3 mV
Lead Channel Sensing Intrinsic Amplitude: 5.75 mV
Lead Channel Sensing Intrinsic Amplitude: 5.75 mV
Lead Channel Setting Pacing Amplitude: 1.5 V
Lead Channel Setting Pacing Amplitude: 2 V
Lead Channel Setting Pacing Pulse Width: 0.4 ms
Lead Channel Setting Sensing Sensitivity: 0.3 mV
Zone Setting Status: 755011
Zone Setting Status: 755011

## 2024-03-10 ENCOUNTER — Ambulatory Visit: Payer: Self-pay | Admitting: Cardiovascular Disease

## 2024-03-12 NOTE — Progress Notes (Signed)
 Remote ICD Transmission

## 2024-03-21 ENCOUNTER — Ambulatory Visit: Admitting: Internal Medicine

## 2024-03-26 ENCOUNTER — Telehealth: Payer: Self-pay | Admitting: Cardiovascular Disease

## 2024-03-26 NOTE — Telephone Encounter (Signed)
 Call returned to Pt.  Pt needs an MRI with [facility].  Office is requesting information regarding Pt's device.  Advised Pt would send MRI clearance form.  Pt thanked nurse for help.

## 2024-03-26 NOTE — Telephone Encounter (Signed)
 Pt needs pacemaker information to schedule upcoming MRI. Please advise.

## 2024-04-24 ENCOUNTER — Ambulatory Visit: Admitting: Orthopedic Surgery

## 2024-06-03 ENCOUNTER — Encounter

## 2024-06-06 ENCOUNTER — Encounter

## 2024-09-02 ENCOUNTER — Encounter

## 2024-09-05 ENCOUNTER — Encounter
